# Patient Record
Sex: Male | Born: 1935 | Race: White | Hispanic: No | Marital: Married | Smoking: Former smoker
Health system: Southern US, Community
[De-identification: ages and names within clinical notes are randomized; demographics above are authoritative.]

## PROBLEM LIST (undated history)

## (undated) DIAGNOSIS — I129 Hypertensive chronic kidney disease with stage 1 through stage 4 chronic kidney disease, or unspecified chronic kidney disease: Secondary | ICD-10-CM

## (undated) DIAGNOSIS — I472 Ventricular tachycardia: Secondary | ICD-10-CM

## (undated) DIAGNOSIS — R269 Unspecified abnormalities of gait and mobility: Secondary | ICD-10-CM

## (undated) DIAGNOSIS — E785 Hyperlipidemia, unspecified: Secondary | ICD-10-CM

## (undated) DIAGNOSIS — C679 Malignant neoplasm of bladder, unspecified: Secondary | ICD-10-CM

## (undated) DIAGNOSIS — I499 Cardiac arrhythmia, unspecified: Principal | ICD-10-CM

## (undated) DIAGNOSIS — N179 Acute kidney failure, unspecified: Secondary | ICD-10-CM

## (undated) DIAGNOSIS — G4730 Sleep apnea, unspecified: Secondary | ICD-10-CM

## (undated) DIAGNOSIS — I440 Atrioventricular block, first degree: Secondary | ICD-10-CM

## (undated) DIAGNOSIS — C61 Malignant neoplasm of prostate: Secondary | ICD-10-CM

## (undated) DIAGNOSIS — H9190 Unspecified hearing loss, unspecified ear: Secondary | ICD-10-CM

## (undated) DIAGNOSIS — I2510 Atherosclerotic heart disease of native coronary artery without angina pectoris: Secondary | ICD-10-CM

## (undated) DIAGNOSIS — M1990 Unspecified osteoarthritis, unspecified site: Secondary | ICD-10-CM

## (undated) DIAGNOSIS — N183 Chronic kidney disease, stage 3 (moderate): Secondary | ICD-10-CM

## (undated) DIAGNOSIS — H409 Unspecified glaucoma: Secondary | ICD-10-CM

## (undated) DIAGNOSIS — I493 Ventricular premature depolarization: Secondary | ICD-10-CM

## (undated) DIAGNOSIS — I4891 Unspecified atrial fibrillation: Secondary | ICD-10-CM

## (undated) DIAGNOSIS — I10 Essential (primary) hypertension: Secondary | ICD-10-CM

## (undated) DIAGNOSIS — R413 Other amnesia: Secondary | ICD-10-CM

## (undated) HISTORY — DX: Atrioventricular block, first degree: I44.0

## (undated) HISTORY — DX: Malignant neoplasm of bladder, unspecified: C67.9

## (undated) HISTORY — DX: Unspecified glaucoma: H40.9

## (undated) HISTORY — DX: Atherosclerotic heart disease of native coronary artery without angina pectoris: I25.10

## (undated) HISTORY — DX: Unspecified atrial fibrillation: I48.91

## (undated) HISTORY — DX: Essential (primary) hypertension: I10

## (undated) HISTORY — DX: Ventricular premature depolarization: I49.3

## (undated) HISTORY — PX: EYE SURGERY: SHX253

## (undated) HISTORY — DX: Hyperlipidemia, unspecified: E78.5

## (undated) HISTORY — DX: Acute kidney failure, unspecified: N17.9

## (undated) HISTORY — DX: Ventricular tachycardia: I47.2

## (undated) HISTORY — DX: Cardiac arrhythmia, unspecified: I49.9

## (undated) HISTORY — PX: CORONARY ANGIOPLASTY: SHX604

## (undated) HISTORY — DX: Hypertensive chronic kidney disease with stage 1 through stage 4 chronic kidney disease, or unspecified chronic kidney disease: I12.9

## (undated) HISTORY — DX: Chronic kidney disease, stage 3 (moderate): N18.3

## (undated) HISTORY — DX: Malignant neoplasm of prostate: C61

## (undated) HISTORY — PX: SKIN BIOPSY: SHX1

## (undated) HISTORY — DX: Unspecified abnormalities of gait and mobility: R26.9

## (undated) HISTORY — PX: SKIN CANCER EXCISION: SHX779

## (undated) HISTORY — DX: Other amnesia: R41.3

---
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
## 1999-03-02 ENCOUNTER — Ambulatory Visit (HOSPITAL_COMMUNITY): Admission: RE | Admit: 1999-03-02 | Discharge: 1999-03-02 | Payer: Self-pay | Admitting: Family Medicine

## 2000-02-25 ENCOUNTER — Encounter: Payer: Self-pay | Admitting: Orthopedic Surgery

## 2000-02-25 ENCOUNTER — Encounter: Admission: RE | Admit: 2000-02-25 | Discharge: 2000-02-25 | Payer: Self-pay | Admitting: Orthopedic Surgery

## 2000-02-26 ENCOUNTER — Ambulatory Visit (HOSPITAL_BASED_OUTPATIENT_CLINIC_OR_DEPARTMENT_OTHER): Admission: RE | Admit: 2000-02-26 | Discharge: 2000-02-26 | Payer: Self-pay | Admitting: Orthopedic Surgery

## 2001-09-16 HISTORY — PX: PROSTATE SURGERY: SHX751

## 2001-09-23 ENCOUNTER — Ambulatory Visit (HOSPITAL_COMMUNITY): Admission: RE | Admit: 2001-09-23 | Discharge: 2001-09-23 | Payer: Self-pay | Admitting: *Deleted

## 2001-09-23 ENCOUNTER — Encounter (INDEPENDENT_AMBULATORY_CARE_PROVIDER_SITE_OTHER): Payer: Self-pay | Admitting: *Deleted

## 2001-11-16 ENCOUNTER — Ambulatory Visit: Admission: RE | Admit: 2001-11-16 | Discharge: 2002-02-14 | Payer: Self-pay

## 2001-12-28 ENCOUNTER — Encounter: Admission: RE | Admit: 2001-12-28 | Discharge: 2001-12-28 | Payer: Self-pay | Admitting: Urology

## 2001-12-28 ENCOUNTER — Encounter: Payer: Self-pay | Admitting: Urology

## 2002-02-02 ENCOUNTER — Ambulatory Visit (HOSPITAL_BASED_OUTPATIENT_CLINIC_OR_DEPARTMENT_OTHER): Admission: RE | Admit: 2002-02-02 | Discharge: 2002-02-02 | Payer: Self-pay | Admitting: Urology

## 2002-02-25 ENCOUNTER — Ambulatory Visit: Admission: RE | Admit: 2002-02-25 | Discharge: 2002-05-26 | Payer: Self-pay | Admitting: Radiation Oncology

## 2002-10-06 ENCOUNTER — Ambulatory Visit (HOSPITAL_COMMUNITY): Admission: RE | Admit: 2002-10-06 | Discharge: 2002-10-06 | Payer: Self-pay | Admitting: Family Medicine

## 2002-10-06 ENCOUNTER — Encounter: Payer: Self-pay | Admitting: Cardiology

## 2004-11-16 ENCOUNTER — Ambulatory Visit (HOSPITAL_COMMUNITY): Admission: RE | Admit: 2004-11-16 | Discharge: 2004-11-16 | Payer: Self-pay | Admitting: *Deleted

## 2004-11-16 ENCOUNTER — Encounter (INDEPENDENT_AMBULATORY_CARE_PROVIDER_SITE_OTHER): Payer: Self-pay | Admitting: *Deleted

## 2007-02-24 ENCOUNTER — Encounter (HOSPITAL_BASED_OUTPATIENT_CLINIC_OR_DEPARTMENT_OTHER): Payer: Self-pay | Admitting: General Surgery

## 2007-02-24 ENCOUNTER — Ambulatory Visit (HOSPITAL_BASED_OUTPATIENT_CLINIC_OR_DEPARTMENT_OTHER): Admission: RE | Admit: 2007-02-24 | Discharge: 2007-02-24 | Payer: Self-pay | Admitting: General Surgery

## 2011-01-29 NOTE — Op Note (Signed)
NAME:  [name], FELDHAUS               ACCOUNT NO.:  [id number]   MEDICAL RECORD NO.:  [id number]          PATIENT TYPE:  AMB   LOCATION:  DSC                          FACILITY:  [facility]   PHYSICIAN:  Leonie Man, M.D.   DATE OF BIRTH:  [birth year]   DATE OF PROCEDURE:  02/24/2007  DATE OF DISCHARGE:                               OPERATIVE REPORT   PREOPERATIVE DIAGNOSIS:  Lesion, left upper arm at elbow, rule out  squamous carcinoma.   POSTOPERATIVE DIAGNOSIS:  Lesion, left upper arm at elbow, rule out  squamous carcinoma.  Pathology pending.   OPERATION/PROCEDURE:  Excision of the lesion, left upper arm.   SURGEON:  Leonie Man, M.D.   ASSISTANT:  OR nurse.   ANESTHESIA:  Local. I used 1% lidocaine with epinephrine and wydase .   INDICATIONS:  The patient a 77 year old gentleman who presents with a  scaling lesion of the upper arm on the left.  He has had multiple small  skin cancers removed in the past.  He is referred for evaluation and  excision of this lesion.   DESCRIPTION OF PROCEDURE:  The patient is positioned supinely and the  left arm extended.  The region around the lesion is prepped and draped  to be included in a sterile operative field.  I infiltrated around the  lesion with lidocaine with epinephrine and made an elliptical incision  around the lesion in the skin line, deepening this through skin down  into the subcutaneous tissues.  The lesion was removed in its entirety.  The subcutaneous tissues were reapproximated with 3-0 Vicryl sutures.  The skin was closed with 4-0 Monocryl suture and reinforced with Steri-  Strips and sterile compressive dressing applied and the patient removed  from the operating room in excellent condition.      Leonie Man, M.D.  Electronically Signed     PB/MEDQ  D:  02/24/2007  T:  02/25/2007  Job:  [id number]

## 2011-02-01 NOTE — Op Note (Signed)
[facility]  Patient:    [name], [name] Visit Number: [id number] MRN: [id number]          Service Type: NES Location: NESC Attending Physician:  Katherine Roan Dictated by:   Rozanna Boer., M.D. Proc. Date: 02/02/02 Admit Date:  02/02/2002                             Operative Report  PREOPERATIVE DIAGNOSIS:  T2B Gleason 3+4 adenocarcinoma of the prostate.  POSTOPERATIVE DIAGNOSIS:  T2B Gleason 3+4 adenocarcinoma of the prostate.  OPERATION PERFORMED:  Brachytherapy of the prostate and cysto.  ANESTHESIA:  General.  SURGEON:  Rozanna Boer., M.D. and Wynn Banker, M.D.  BRIEF HISTORY:  This 77 year old patient had a clinical T2B Gleason 3+4 adenocarcinoma of the prostate, PSA of 3.1, 41 gm prostate. He underwent 4500 rads of external beam therapy and enters now for brachytherapy for completion of his therapy. He has a nodule on the right side with 30% positive biopsies on the right and 10% on the left. The patient understands the risks and complications of radiotherapy and enters now for this procedure.  DESCRIPTION OF PROCEDURE:  The patient is placed in the dorsal lithotomy position after satisfactory induction of general anesthesia was prepped and draped with Betadine in the usual sterile fashion. The rectal ultrasound 7.5 MHz probe was inserted in the rectum and the patient was positioned and the probe to correspond to the preoperative scanning planning films. The Foley catheter that had been previously positioned in the urethra and the rectum were carefully noted. A total of 20 needles with 87 seeds of palladium gold were then placed using ultrasonic and fluoroscopic guidance with a satisfactory covering of the prostate.  After the seeds were placed, the Foley catheter was removed and a flexible cystoscope was used to inspect the urethra and then make sure that there were no seeds there or in  the bladder which there were not. The bladder was 1+ trabeculated and had no mucosal lesions. The trigonal orifices looked normal.  The scope was removed and a #16 Foley catheter inserted. The patient was taken to the recovery room where he will be sent home as an outpatient with detailed written instructions. Dictated by:   Rozanna Boer., M.D. Attending Physician:  Katherine Roan DD:  02/02/02 TD:  02/03/02 Job: [id number]

## 2011-02-01 NOTE — Op Note (Signed)
[facility]  Patient:    [name], [name]                      MRN: [id number] Proc. Date: 02/26/00 Adm. Date:  [id number] Disc. Date: [id number] Attending:  Susa Day CC:         Katy Fitch. Sypher, Montez Hageman., M.D. (2)                           Operative Report  PREOPERATIVE DIAGNOSIS:  Entrapment neuropathy, median nerve, right carpal tunnel.  POSTOPERATIVE DIAGNOSIS:  Entrapment neuropathy, median nerve, right carpal tunnel.  OPERATION PERFORMED:  Release of right transverse carpal ligament.  OPERATING SURGEON:  Josephine Igo, M.D.  ANESTHESIA:  IV regional at forearm level.  ANESTHESIOLOGIST:  Edwin Cap. Zoila Shutter, M.D.  INDICATIONS:  The patient is a 77 year old man who was noted by his family physician, Dr. Abigail Miyamoto, to have severe carpal tunnel syndrome with thenar atrophy and numbness in the median distribution of the right hand.  Dr. Abigail Miyamoto recommended a hand surgery consult.  Electrodiagnostic studies confirmed profound carpal tunnel syndrome.  The patient is brought to the operating room at this time for release of his right transverse carpal ligament.  He understands it will be at least six months until he recovers motor function of his thenar muscles and he will probably never have complete recovery of median nerve function due to the severity of his entrapment neuropathy.  After informed consent, he is brought to the operating room at this time for release of his right transverse carpal ligament.  DESCRIPTION OF PROCEDURE:  Salim Zelada was brought to the operating room and placed in supine position on the operating table.  Following light sedation, an IV regional block was placed at the proximal forearm level on the right. When anesthesia was satisfactory, the procedure commenced with a short incision in line of the ring finger in the palm.  The subcutaneous tissues were carefully divided revealing the palmar  fascia.  This was split longitudinally to reveal the common sensory branch of the median nerve.  These were followed back to the transverse carpal ligament which was carefully isolated from the median nerve.  The ligament was released on its ulnar border extending into the distal forearm.  This widely opened the carpal canal.  No masses or other predicaments were noted.  The wound was inspected for bleeding points.  Bleeding points along the margins of the transverse carpal ligament were electrocauterized with bipolar current followed by repair of the skin with intradermal 3-0 Prolene suture.  A compressive dressing was applied with a volar plaster splint maintaining the wrist in 5 degrees dorsiflexion. DD:  02/26/00 TD:  02/29/00 Job: [id number]

## 2011-02-15 HISTORY — PX: CARDIAC CATHETERIZATION: SHX172

## 2011-02-25 ENCOUNTER — Inpatient Hospital Stay (HOSPITAL_COMMUNITY)
Admission: EM | Admit: 2011-02-25 | Discharge: 2011-02-27 | Disposition: A | Discharge status: Other Acute Inpatient Hospital | Payer: 59 | Source: Home / Self Care | Attending: Internal Medicine | Admitting: Internal Medicine

## 2011-02-25 ENCOUNTER — Emergency Department (HOSPITAL_COMMUNITY): Payer: 59

## 2011-02-25 DIAGNOSIS — I498 Other specified cardiac arrhythmias: Principal | ICD-10-CM | POA: Diagnosis present

## 2011-02-25 DIAGNOSIS — Z880 Allergy status to penicillin: Secondary | ICD-10-CM

## 2011-02-25 DIAGNOSIS — I4729 Other ventricular tachycardia: Secondary | ICD-10-CM | POA: Diagnosis present

## 2011-02-25 DIAGNOSIS — I472 Ventricular tachycardia, unspecified: Secondary | ICD-10-CM | POA: Diagnosis present

## 2011-02-25 DIAGNOSIS — Z8546 Personal history of malignant neoplasm of prostate: Secondary | ICD-10-CM

## 2011-02-25 DIAGNOSIS — Z7982 Long term (current) use of aspirin: Secondary | ICD-10-CM

## 2011-02-25 DIAGNOSIS — I2510 Atherosclerotic heart disease of native coronary artery without angina pectoris: Secondary | ICD-10-CM | POA: Diagnosis present

## 2011-02-25 DIAGNOSIS — E785 Hyperlipidemia, unspecified: Secondary | ICD-10-CM | POA: Diagnosis present

## 2011-02-25 DIAGNOSIS — H269 Unspecified cataract: Secondary | ICD-10-CM | POA: Diagnosis present

## 2011-02-25 DIAGNOSIS — I10 Essential (primary) hypertension: Secondary | ICD-10-CM | POA: Diagnosis present

## 2011-02-25 LAB — BASIC METABOLIC PANEL
Calcium: 10 mg/dL (ref 8.4–10.5)
GFR calc non Af Amer: >60 mL/min (ref >60)
Glucose, Bld: 106 mg/dL — ABNORMAL HIGH (ref 70–99)
Potassium: 4.6 mEq/L (ref 3.5–5.1)
Sodium: 137 mEq/L (ref 135–145)

## 2011-02-25 LAB — CBC
HCT: 48.4 % (ref 39.0–52.0)
HCT: 48.8 % (ref 39.0–52.0)
Hemoglobin: 16.4 g/dL (ref 13.0–17.0)
Hemoglobin: 16.3 g/dL (ref 13.0–17.0)
MCH: 32.4 pg (ref 26.0–34.0)
MCHC: 33.9 g/dL (ref 30.0–36.0)
MCHC: 33.4 g/dL (ref 30.0–36.0)
MCV: 97 fL (ref 78.0–100.0)
RBC: 4.99 MIL/uL (ref 4.22–5.81)
RBC: 5.03 MIL/uL (ref 4.22–5.81)
WBC: 10.9 10*3/uL — ABNORMAL HIGH (ref 4.0–10.5)

## 2011-02-25 LAB — DIFFERENTIAL
Basophils Absolute: 0.1 10*3/uL (ref 0.0–0.1)
Basophils Relative: 1 % (ref 0–1)
Lymphocytes Relative: 29 % (ref 12–46)
Lymphocytes Relative: 21 % (ref 12–46)
Lymphs Abs: 2.5 10*3/uL (ref 0.7–4.0)
Monocytes Absolute: 1.4 10*3/uL — ABNORMAL HIGH (ref 0.1–1.0)
Monocytes Absolute: 1.2 10*3/uL — ABNORMAL HIGH (ref 0.1–1.0)
Monocytes Relative: 10 % (ref 3–12)
Neutro Abs: 5.8 10*3/uL (ref 1.7–7.7)
Neutro Abs: 7.5 10*3/uL (ref 1.7–7.7)
Neutrophils Relative %: 53 % (ref 43–77)
Neutrophils Relative %: 65 % (ref 43–77)

## 2011-02-25 LAB — COMPREHENSIVE METABOLIC PANEL
ALT: 18 U/L (ref 0–53)
AST: 23 U/L (ref 0–37)
Alkaline Phosphatase: 62 U/L (ref 39–117)
CO2: 28 mEq/L (ref 19–32)
Calcium: 9.9 mg/dL (ref 8.4–10.5)
GFR calc Af Amer: >60 mL/min (ref >60)
GFR calc non Af Amer: >60 mL/min (ref >60)
Glucose, Bld: 125 mg/dL — ABNORMAL HIGH (ref 70–99)
Potassium: 4.3 mEq/L (ref 3.5–5.1)
Sodium: 137 mEq/L (ref 135–145)
Total Protein: 6.6 g/dL (ref 6.0–8.3)

## 2011-02-25 LAB — CARDIAC PANEL(CRET KIN+CKTOT+MB+TROPI)
CK, MB: 7.1 ng/mL (ref 0.3–4.0)
Relative Index: 4 — ABNORMAL HIGH (ref 0.0–2.5)
Total CK: 206 U/L (ref 7–232)
Total CK: 178 U/L (ref 7–232)

## 2011-02-25 LAB — APTT: aPTT: 30 seconds (ref 24–37)

## 2011-02-25 LAB — TROPONIN I: Troponin I: <0.3 ng/mL (ref <0.30)

## 2011-02-25 LAB — PRO B NATRIURETIC PEPTIDE: Pro B Natriuretic peptide (BNP): 1041 pg/mL — ABNORMAL HIGH (ref 0–450)

## 2011-02-25 LAB — PROTIME-INR: Prothrombin Time: 13.3 seconds (ref 11.6–15.2)

## 2011-02-25 LAB — CK TOTAL AND CKMB (NOT AT ARMC): CK, MB: 10.2 ng/mL (ref 0.3–4.0)

## 2011-02-26 DIAGNOSIS — I498 Other specified cardiac arrhythmias: Secondary | ICD-10-CM

## 2011-02-26 DIAGNOSIS — I517 Cardiomegaly: Secondary | ICD-10-CM

## 2011-02-26 LAB — CARDIAC PANEL(CRET KIN+CKTOT+MB+TROPI)
Relative Index: 4.2 — ABNORMAL HIGH (ref 0.0–2.5)
Troponin I: <0.3 ng/mL (ref <0.30)

## 2011-02-26 LAB — LIPID PANEL
HDL: 50 mg/dL (ref >39)
Total CHOL/HDL Ratio: 2.6 RATIO
Triglycerides: 71 mg/dL (ref <150)

## 2011-02-27 ENCOUNTER — Inpatient Hospital Stay (HOSPITAL_COMMUNITY)
Admission: RE | Admit: 2011-02-27 | Discharge: 2011-02-27 | DRG: 287 | Disposition: A | Discharge status: Home / Self Care | Payer: 59 | Source: Ambulatory Visit | Attending: Cardiovascular Disease | Admitting: Cardiovascular Disease

## 2011-02-27 DIAGNOSIS — I2510 Atherosclerotic heart disease of native coronary artery without angina pectoris: Secondary | ICD-10-CM

## 2011-02-27 LAB — CBC
HCT: 44.2 % (ref 39.0–52.0)
Hemoglobin: 14.9 g/dL (ref 13.0–17.0)
MCH: 32.7 pg (ref 26.0–34.0)
MCHC: 33.7 g/dL (ref 30.0–36.0)
MCV: 96.9 fL (ref 78.0–100.0)
RDW: 13.6 % (ref 11.5–15.5)

## 2011-02-27 LAB — POCT I-STAT 3, ART BLOOD GAS (G3+)
Acid-base deficit: 1 mmol/L (ref 0.0–2.0)
Bicarbonate: 24.3 mEq/L — ABNORMAL HIGH (ref 20.0–24.0)
TCO2: 26 mmol/L (ref 0–100)
pO2, Arterial: 72 mmHg — ABNORMAL LOW (ref 80.0–100.0)

## 2011-02-27 LAB — POCT I-STAT 3, VENOUS BLOOD GAS (G3P V)
Bicarbonate: 25.4 mEq/L — ABNORMAL HIGH (ref 20.0–24.0)
pCO2, Ven: 44.7 mmHg — ABNORMAL LOW (ref 45.0–50.0)
pH, Ven: 7.363 — ABNORMAL HIGH (ref 7.250–7.300)
pO2, Ven: 35 mmHg (ref 30.0–45.0)

## 2011-02-27 LAB — COMPREHENSIVE METABOLIC PANEL
Alkaline Phosphatase: 50 U/L (ref 39–117)
BUN: 31 mg/dL — ABNORMAL HIGH (ref 6–23)
Calcium: 9.4 mg/dL (ref 8.4–10.5)
Creatinine, Ser: 1.26 mg/dL (ref 0.4–1.5)
GFR calc Af Amer: >60 mL/min (ref >60)
Glucose, Bld: 98 mg/dL (ref 70–99)
Total Protein: 6.1 g/dL (ref 6.0–8.3)

## 2011-02-28 ENCOUNTER — Encounter: Payer: Self-pay | Admitting: *Deleted

## 2011-03-11 NOTE — H&P (Signed)
[name], [name]               ACCOUNT NO.:  [id number]  MEDICAL RECORD NO.:  [id number]  LOCATION:  1441                         FACILITY:  [facility]  PHYSICIAN:  Kathlen Mody, MD       DATE OF BIRTH:  11/22/1926  DATE OF ADMISSION:  02/25/2011 DATE OF DISCHARGE:                             HISTORY & PHYSICAL   PRIMARY CARE PHYSICIAN:  Aida Puffer, MD  CHIEF COMPLAINT:  Generalized weakness, fatigue over 3 weeks and bradycardia of 32.  HISTORY OF PRESENT ILLNESS:  This is an 77 year old gentleman with history of hypertension, hyperlipidemia, prostate cancer who was about to get a cataract surgery.  He was found to have a pulse rate of 32 and he was sent to his primary care physician, Dr. Clarene Duke.  Dr. Clarene Duke advised him to come to [facility] ED.  While he was in the ED, an EKG was done which showed a pulse rate of 60s with a lot of PVCs and possible bigeminy.  The patient denies any chest pain, shortness of breath, syncope, palpitations, orthopnea or PND.  Denies any fever, chills or cough.  The patient has generalized weakness and fatigue for over 3 weeks.  The patient had a stress test about 5 years ago and he was told that it was within normal limits.  He denied any other past cardiac history.  The patient also denies any headache, blurry vision, any thyroid problems.  The patient denies any tingling or numbness anywhere in his body.  The patient has a history of urinary urgency from his prostate cancer.  The patient denies any syncope, dizziness at this time.  REVIEW OF SYSTEMS:  See HPI, otherwise negative.  PAST MEDICAL/SURGICAL HISTORY:  History of hypertension; hyperlipidemia; prostate cancer, status post seed implant and radiation; appendectomy at the age of 79.  SOCIAL HISTORY:  The patient lives at home.  Denies smoking, occasional EtOH.  Denies any recreational drug abuse.  HOME MEDICATIONS: 1. Aspirin 81 mg. 2. Lisinopril 20 mg daily. 3. Lipitor 10 mg  daily. 4. Hydrochlorothiazide 12.5 mg.  ALLERGIES:  The patient is allergic to PENICILLIN.  PHYSICAL EXAMINATION:  VITAL SIGNS:  Temperature of 98, pulse of 60, respiratory rate 20, blood pressure 140/75, saturating 100% on room air. GENERAL:  He is alert, afebrile, oriented x3, comfortable, in no acute distress. HEENT:  Normocephalic, atraumatic.  No JVD.  Pupils reacting equally to light and accommodation. CARDIOVASCULAR:  Regular rate and regular rhythm.  No murmurs, rubs or gallops. RESPIRATORY:  Chest clear to auscultation bilaterally.  No wheezing or rhonchi. ABDOMEN:  Soft, nontender, nondistended.  Bowel sounds present.  No organomegaly felt. EXTREMITIES:  No pedal edema. NEUROLOGIC:  The patient is alert, oriented x3.  No focal deficits.  PERTINENT LABORATORY AND X-RAY DATA:  EKG:  Normal sinus, PVCs and first- degree AV block, prolonged PR, and nonspecific T-wave changes.  The patient had a CBC done, significant for WBC count of 10.9, rest of the CBC was within normal limits.  Basic metabolic panel significant for a BUN of 32, glucose of 106.  Troponin, normal; total CK is 294 with moderate hemolysis; CK-MB was 10.2 with normal troponin.  Chest x-ray  showed cardiomegaly, no active disease.  ASSESSMENT AND PLAN:  Bradycardia/generalized weakness and fatigue over 2 weeks.  The patient at this time is not on any beta-blockers or calcium channel blockers.  The patient's generalized weakness or fatigue could be secondary to his bradycardia/abnormal EKG with a lot of PVCs versus secondary to his elevated CK, but first we will admit him to observation under telemetry, monitor his heart rate for 24 hours.  Will get cardiac enzymes q.6 h x3.  Will also get a 2-D echocardiogram as his chest x-ray shows cardiomegaly.  Will rule out other causes of bradycardia like TSH.  Cardiology consult was already called.  We will hold the Lipitor for now.  Will get a lipid profile and repeat  CK levels.  Hypertension:  Will resume his home blood pressure medication of lisinopril.  Slightly elevated white blood cell count/leukocytosis:  The patient is afebrile.  His chest x-ray is negative.  Will also get a urinalysis.  No antibiotics needed at this time.  Deep venous thrombosis prophylaxis:  Subcutaneous Lovenox, dosing as per the pharmacy.  The patient is full code.          ______________________________ Kathlen Mody, MD     VA/MEDQ  D:  02/25/2011  T:  02/25/2011  Job:  [id number]  Electronically Signed by Kathlen Mody MD on 03/11/2011 02:27:08 AM

## 2011-03-13 ENCOUNTER — Encounter: Payer: Self-pay | Admitting: Physician Assistant

## 2011-03-13 NOTE — Cardiovascular Report (Signed)
[name], [name]               ACCOUNT NO.:  [id number]  MEDICAL RECORD NO.:  [id number]  LOCATION:  6527                         FACILITY:  [facility]  PHYSICIAN:  Lorine Bears, MD     DATE OF BIRTH:  [birth year]  DATE OF PROCEDURE:  02/27/2011 DATE OF DISCHARGE:  02/27/2011                           CARDIAC CATHETERIZATION   PROCEDURES PERFORMED: 1. Left heart catheterization. 2. Right heart catheterization. 3. Coronary angiography. 4. Left ventricular angiography. 5. Pressure wire interrogation of the right coronary artery.  INDICATIONS AND CLINICAL HISTORY:  This is an 77 year old male with no previous cardiac history.  He presented for an elective cataract surgery.  However, he was found to be bradycardiac which was thought to be due to ventricular bigeminy.  He was noted to have frequent PVCs.  He had an echocardiogram done which showed some wall motion abnormalities in the inferior wall.  The patient's symptoms included mild exertional dyspnea without chest pain.  Due to frequent PVCs and an abnormal echocardiogram, cardiac catheterization was recommended.  A right heart catheterization was also recommended due to findings of mild heart failure by physical exam.  Risks, benefits, and alternatives were discussed with the patient.  STUDY DETAILS:  A standard informed consent was obtained.  The right groin area was prepped in a sterile fashion.  It was anesthetized with 1% lidocaine.  A 7-French sheath was placed in the right femoral vein. A 5-French sheath was placed in the right femoral artery.  Right heart catheterization was performed with a Swan-Ganz balloon-tipped catheter. Serial pressure recordings were performed.  Blood samples were obtained from the right femoral artery as well as right pulmonary artery to calculate cardiac output by the Fick method.  Left ventricular angiography was performed with pigtail catheter.  A simultaneous left ventricular pressure and  wedge pressures were recorded as well.  A pullback across the aortic valve was performed.  Coronary angiography was then performed with a JL-4 and JR-4 catheters.  Moderate to borderline significant disease in the right coronary artery was noted. Thus, I decided to proceed with pressure wire interrogation of the right coronary artery.  A 5-French sheath was upsized to a 6-French sheath. IV bivalirudin was initiated with subsequent therapeutic ACT.  Volcano Pressure wire was used in standard fashion.  IV adenosine at a dose of 140 mcg/kg/minute was given through a peripheral line.  The interrogation revealed a non flow-limiting lesion and thus, the wire was removed.  Final angiography showed no complications.  The sheaths were sutured in place.  The patient tolerated the procedure well with no immediate complications.  STUDY FINDINGS:  Hemodynamic findings:  Right atrial pressure is 9/8 with a mean of 6 mmHg.  Right ventricular pressure is 44/3 with a right ventricular end-diastolic pressure of 9 mmHg.  Pulmonary wedge pressure is 18/19 with a mean of 13 mmHg.  Pulmonary artery pressure is 41/13 with a mean of 22 mmHg.  Left ventricular pressure is 145/17 with a left ventricular end-diastolic pressure of 17 mmHg.  Central aortic pressure is 143/48 with a mean pressure of 75 mmHg.  Diastolic gradient was noted between the left ventricular and pulmonary wedge pressure.  Oxygen saturation in  the pulmonary artery was 64% and in the right femoral artery was 94%.  Calculated cardiac output was 4.44 liters per minute. Cardiac index was 2.22.  Calculated pulmonary vascular resistance is 2 Woods units.  Left ventricular angiography:  This showed low normal LV systolic function with an estimated ejection fraction of 50-55%.  CORONARY ANGIOGRAPHY:  Left main coronary artery:  The vessel is normal in size with mild calcifications.  There is 10% disease distally close to the bifurcation. Left  anterior descending artery:  The vessel is relatively small in size with mild calcifications.  There is 20% diffuse disease proximally as well as in the mid and the distal LAD without any other evidence of obstructive disease. Ramus coronary artery:  The vessel is normal in size and free of significant disease. Left circumflex artery:  The vessel is normal in size and free of significant disease.  It has minor calcifications. Right coronary artery:  The vessel overall is moderately-to-heavily calcified, especially in the proximal and mid segments.  Proximally, there is a calcified nodule, causing a hazy appearance with an estimated resulting stenosis of 50%.  In the mid segment, there is another discrete 40-50% stenosis.  The rest of the right coronary artery has minor irregularities without any obstructive disease.  STUDY CONCLUSIONS: 1. Mildly elevated filling pressures with only mild pulmonary     hypertension.  Normal cardiac output. 2. Low normal LV systolic function with an estimated ejection fraction     of 50-55%. 3. Moderate right coronary artery disease which was found to be non-     flow limiting by pressure wire interrogation.  FFR ratio was 0.86.     There is mild disease otherwise in the LAD.  RECOMMENDATIONS:  Aggressive medical therapy is recommended.  No revascularization is advised.     Lorine Bears, MD     MA/MEDQ  D:  02/27/2011  T:  02/28/2011  Job:  [id number]  cc:   Duke Salvia, MD, [facility]  Electronically Signed by Lorine Bears MD on 03/13/2011 02:39:17 PM

## 2011-03-13 NOTE — Discharge Summary (Signed)
NAMEAVANTE, CARNEIRO NO.:  [id number]  MEDICAL RECORD NO.:  [id number]  LOCATION:                                 FACILITY:  PHYSICIAN:  Lorine Bears, MD     DATE OF BIRTH:  09/14/1927  DATE OF ADMISSION:  02/27/2011 DATE OF DISCHARGE:  02/28/2011                              DISCHARGE SUMMARY   PRIMARY CARDIOLOGIST:  Duke Salvia, MD, [facility]  PRIMARY CARE PROVIDER:  Anna Genre. Little, MD  DISCHARGE DIAGNOSIS:  Asymptomatic ventricular bigeminy.  SECONDARY DIAGNOSES: 1. Nonobstructive coronary disease by catheterization this admission. 2. Nonsustained ventricular tachycardia. 3. Hypertension. 4. Prostate cancer status post brachytherapy in 2003. 5. Hyperlipidemia. 6. Cataracts.  ALLERGIES:  PENICILLIN.  PROCEDURES:  A 2-D echocardiogram performed on February 26, 2011 showing an EF of 55% with normal cavity size, mild LVH.  Akinesis localized to the base of the inferolateral segment which was new from 2004.  PROCEDURE:  Diagnostic cardiac catheterization performed on June 13,h 2012, revealing nonobstructive coronary artery disease.  There was a calcified nodule in the right coronary artery approximately 50% stenosis with fractional flow reserve of 0.86.  Left ventriculography revealed an EF of 50-55%.  Right heart catheterization was performed showing RA of 9, RV 44/3, PCWP 13, PA 41/13 (22).  Cardiac output is 4.44 liters per minute.  PVR 2.0 Woods unit.  HISTORY OF PRESENT ILLNESS:  An 77 year old male without prior cardiac history who was scheduled to have cataract surgery on February 25, 2011, however, while in the preoperative area was noted by pulse oximetry to have a heart rate of 32.  Surgery was cancelled and patient was sent to [facility] for admission.  In the ED, the patient was found to be in sinus rhythm at a rate of 60 with frequent PVCs and at times ventricular bigeminy.  The patient was admitted and Cardiology was  subsequently consulted.  HOSPITAL COURSE:  The patient had negative cardiac markers.  It was felt that the frequent PVCs accounting for the patient's recorded heart rate of 32 by pulse oximetry representing the poor perfusion of the PVCs.  A 2-D echocardiogram was performed and this showed normal LV function, but appeared to show akinesis localized to the base inferolateral segment. This was felt to be new finding compared to 2004.  Because of frequency of PVCs, Electrophysiology was consulted and in light of patient's presumed wall motion abnormality on echo, it was felt that he would require additional ischemic evaluation, namely, cardiac catheterization.  The patient was transferred to [facility] on February 26, 2101, diagnostic catheterization was performed showing nonobstructive CAD and normal LV function as outlined above.  Right heart catheterization did show mildly elevated right heart pressures.  Continued medical therapy is warranted. At this time, the patient is not on beta-blocker therapy to suppress PVC secondary to baseline bradycardia in the absence of PVCs.  It was felt by Electrophysiology that patient may benefit from an ISA beta-blocker or possibly flecainide, but at this point, in the absence of symptoms, we will not initiate beta-blocker therapy.  They have arranged for followup in our office in approximately 2 weeks for further evaluation discussion.  DISCHARGE LABORATORY DATA:  Hemoglobin 14.9, hematocrit 44.2, WBC 12.0, platelets 178.  Sodium 139, potassium 4.0, chloride 103, CO2 of 28, BUN 31, creatinine 1.26, glucose 98, total bilirubin 0.4, alkaline phosphatase 50, AST 19, ALT 15, total protein 6.1, albumin 3.2, calcium 9.4, phosphorus 3.1, magnesium 2.4, CK 140, MB 5.9, troponin-I less than 0.30, total cholesterol 130, triglycerides 71, HDL 50, LDL 66,  TSH 1.541.  DISPOSITION:  The patient will be discharged home today in good condition.  FOLLOWUP PLANS  AND APPOINTMENTS:  The patient will follow up with Tereso Newcomer PA, [facility] on March 14, 2011, at 11 a.m.  He will continue to follow up Dr. Clarene Duke as previous scheduled.  DISCHARGE MEDICATIONS: 1. Aspirin 81 mg daily. 2. Benefiber over-the-counter once daily. 3. Colon Health 1 cap daily. 4. Fish oil 1 g b.i.d. 5. Hydrochlorothiazide 12.5 mg daily. 6. Lipitor 20 mg daily. 7. Multivitamin 1 tablet daily. 8. Ramipril 10 mg daily. 9. Stool softener 1 tab daily p.r.n.  OUTSTANDING LAB STUDIES:  None.  Duration discharge encounter 60 minutes including physician time.     Nicolasa Ducking, ANP   ______________________________ Lorine Bears, MD    CB/MEDQ  D:  02/27/2011  T:  02/28/2011  Job:  [id number]  cc:   Caryn Bee L. Little, M.D.  Electronically Signed by Nicolasa Ducking ANP on 03/08/2011 02:30:21 PM Electronically Signed by Lorine Bears MD on 03/13/2011 02:39:38 PM

## 2011-03-14 ENCOUNTER — Encounter: Payer: Self-pay | Admitting: Physician Assistant

## 2011-03-14 ENCOUNTER — Ambulatory Visit (INDEPENDENT_AMBULATORY_CARE_PROVIDER_SITE_OTHER): Payer: 59 | Admitting: Physician Assistant

## 2011-03-14 VITALS — BP 116/70 | HR 56 | Ht 69.0 in | Wt 182.8 lb

## 2011-03-14 DIAGNOSIS — I498 Other specified cardiac arrhythmias: Secondary | ICD-10-CM | POA: Insufficient documentation

## 2011-03-14 DIAGNOSIS — H269 Unspecified cataract: Secondary | ICD-10-CM

## 2011-03-14 DIAGNOSIS — E785 Hyperlipidemia, unspecified: Secondary | ICD-10-CM | POA: Insufficient documentation

## 2011-03-14 DIAGNOSIS — I472 Ventricular tachycardia: Secondary | ICD-10-CM

## 2011-03-14 DIAGNOSIS — I10 Essential (primary) hypertension: Secondary | ICD-10-CM

## 2011-03-14 DIAGNOSIS — I2510 Atherosclerotic heart disease of native coronary artery without angina pectoris: Secondary | ICD-10-CM

## 2011-03-14 MED ORDER — FLECAINIDE ACETATE 100 MG PO TABS: 100.0000 mg | ORAL_TABLET | Freq: Two times a day (BID) | ORAL | Status: DC

## 2011-03-14 NOTE — Assessment & Plan Note (Signed)
Continue aspirin and statin. 

## 2011-03-14 NOTE — Assessment & Plan Note (Signed)
Managed by PCP

## 2011-03-14 NOTE — Assessment & Plan Note (Addendum)
He is asymptomatic.  He has related functional bradycardia.  I have discussed his case today with Dr. Graciela Husbands.  There is some concern that he could develop a cardiomyopathy with continued PVCs.  Therefore, Dr. Graciela Husbands recommended we start him on flecainide.  He does have nonobstructive coronary disease.  In the short term, it should be acceptable for him to take flecainide.  He will be placed on 100 mg b.i.d.  He will be brought back for an exercise treadmill test in 1-2 weeks.   I discussed his case further with Dr. Graciela Husbands.  He probably should not remain on Flecainide long term given his nonobstructive CAD.  Therefore, if his ectopy is improved at the time of his ETT with the flecainide, and his ETT does not demonstrate any exercise induced ventricular arrhythmias, we will plan on switching him to Propafenone 225 mg BID.    He will have follow up arranged in 3 months with Dr. Graciela Husbands.

## 2011-03-14 NOTE — Assessment & Plan Note (Signed)
He currently has no cardiovascular contraindication to proceeding with cataract surgery.  I filled out the paperwork for him today and fax it to his ophthalmologist.

## 2011-03-14 NOTE — Patient Instructions (Signed)
Your physician recommends that you schedule a follow-up appointment in: 3 months with Dr. Graciela Husbands as per Tereso Newcomer, PA-C.  Your physician has requested that you have an exercise tolerance test 427.1 THIS NEEDS TO BE DONE WITHIN THE NEXT 1-2 WEEKS, OK  TO SET UP WITH SCOTT WEAVER, PA-C. For further information please visit [email or web address] Please also follow instruction sheet, as given.   Your physician has recommended you make the following change in your medication: START FLECAINIDE 100 MG 1 TABLET EVERY 12 HOURS .

## 2011-03-14 NOTE — Assessment & Plan Note (Signed)
Controlled.  Continue current therapy.  

## 2011-03-14 NOTE — Progress Notes (Signed)
History of Present Illness: Primary Electrophysiologist:  Dr. [name] is a 77 y.o. male who returns for post hospital follow up.  He was admitted to [facility] after having a low HR noted prior to planned cataract surgery.  He was sent to the ED and noted to have PVC's and ventricular bigeminy.  He had an echo that demonstrated normal LVF, EF 55% but basal inferolateral HK, which was new.  He was noted to have NSVT on tele.  He saw Dr. Graciela Husbands who recommended cardiac cath.  This demonstrated nonobstructive CAD.  He did have a proximal calcified nodule in the RCA which was studied by FFR.  This was felt to be insignificant.  It was recommended that trying to prevent PVCs may be warranted to prevent developing a cardiomyopathy.  In follow up today, he denies chest pain, dyspnea, syncope.  No orthopnea, PND or edema.  His cath site is ok.  He still needs cardiac clearance for his cataract surgery.  He denies palpitations.  Past Medical History  Diagnosis Date  . Hyperlipidemia   . Cataract   . Prostate cancer     status post brachytherapy---in 2003  . Hypertension   . Ventricular tachycardia, nonsustained     echo 6/12: EF 55%, basal inferolat AK, mild LVH  . Coronary disease     cath 02/28/11: dLM 10%, pLAD 20%, pRCA calcified nodule 50% (FFR 0.86 - not significant), mRCA 40-50%, mildly elevated filling pressures, EF 50-55%  . Ventricular bigeminy     eval by EF in past    Current Outpatient Prescriptions  Medication Sig Dispense Refill  . aspirin 81 MG tablet Take 81 mg by mouth daily.        Marland Kitchen atorvastatin (LIPITOR) 20 MG tablet Take 20 mg by mouth daily.        Jennette Banker Sodium 30-100 MG CAPS Take by mouth.        . fish oil-omega-3 fatty acids 1000 MG capsule Take 2 g by mouth daily.        Marland Kitchen guar gum packet Take by mouth 3 (three) times daily with meals.        . hydrochlorothiazide (,MICROZIDE/HYDRODIURIL,) 12.5 MG capsule Take 12.5 mg by mouth daily.          . Multiple Vitamin (MULTIVITAMIN) tablet Take 1 tablet by mouth daily.        . Probiotic Product (PHILLIPS COLON HEALTH PO) Take by mouth.        . ramipril (ALTACE) 10 MG capsule Take 20 mg by mouth daily.        Marland Kitchen DISCONTD: ramipril (ALTACE) 10 MG capsule Take 10 mg by mouth daily.        . flecainide (TAMBOCOR) 100 MG tablet Take 1 tablet (100 mg total) by mouth 2 (two) times daily.  60 tablet  6    Allergies: Allergies  Allergen Reactions  . Penicillins     Vital Signs: BP 116/70  Pulse 56  Ht 5\' 9"  (1.753 m)  Wt 182 lb 12.8 oz (82.918 kg)  BMI 26.99 kg/m2  PHYSICAL EXAM: Well nourished, well developed, in no acute distress HEENT: normal Neck: no JVD Cardiac:  normal S1, S2; RRR with frequent premature beats; no murmur Lungs:  clear to auscultation bilaterally, no wheezing, rhonchi or rales Abd: soft, nontender, no hepatomegaly Ext: no edema;  RFA site without hematoma or bruit Skin: warm and dry Neuro:  CNs 2-12 intact, no focal abnormalities noted  EKG:  Sinus brady, HR 56, LAD, PRWP, ventricular bigeminy  ASSESSMENT AND PLAN:

## 2011-03-19 NOTE — Consult Note (Signed)
NAMEIZAAH, WESTMAN NO.:  [id number]  MEDICAL RECORD NO.:  [id number]  LOCATION:  1441                         FACILITY:  [facility]  PHYSICIAN:  Duke Salvia, MD, FACCDATE OF BIRTH:  April 25, 1927  DATE OF CONSULTATION: DATE OF DISCHARGE:                                CONSULTATION   Thank you very much for asking [place] to see Mr. Lengacher in consultation because of ventricular ectopy.  He is an 77 year old gentleman with a past medical history notable for hypertension, dyslipidemia, prostate cancer for which he underwent seed therapy years ago who was to undergo cataract surgery yesterday.  He was noted on monitoring of heart rate of about 32 which turned out to be related to ventricular bigeminy.  He was subsequently admitted.  He ruled out for myocardial infarction although he did have a positive MB with a normal troponin.  He underwent echocardiogram demonstrating normal left ventricular function apart from a concern about an inferior wall motion abnormality that was distinct from 2004.  On his review, it is not clear that there has been a change in his functional status, although there may be some extra fatigue over the last couple of months.  He is otherwise quite active, mowing the yard, playing golf etc. all in a pattern similar to what has been through previously.  He underwent a stress test a couple of years ago, these data are not available and it is not clear from the patient whether he has ever had ventricular ectopy noted previously.  He denies exercise intolerance as noted apart from what has been quite typical.  There has been no syncope and no lightheadedness.  MEDICATIONS:  His medications as an outpatient include Lipitor, hydrochlorothiazide, lisinopril and aspirin.  PAST MEDICAL HISTORY:  His past medical history is notable as above.  Past surgical history is the same except he has also had appendectomy.  SOCIAL HISTORY:  He lives at home  with his wife.  He is remarried.  He lost a daughter at age of 41 of lung cancer.  He does not smoke.  Uses alcohol occasionally.  Denies use of recreational drugs.  ALLERGIC:  PENICILLIN.  REVIEW OF SYSTEMS:  Apart from the above is broadly negative across all organ systems except for the ongoing sadness related to death of his daughter some number of years ago.  PHYSICAL EXAMINATION:  GENERAL:  On examination, he is an elderly Caucasian male who appeared his stated age. VITAL SIGNS:  His blood pressure is 130/60, his pulse is 60, his palpable pulse is 30. HEENT EXAM:  Normal. NECK:  Neck veins were 10 to 11 cm.  Carotids are brisk and full bilaterally without bruits. BACK:  Without kyphosis, scoliosis. LUNGS:  Clear. HEART:  Sounds were irregular.  No murmurs or gallops were appreciated. It was quite slow. ABDOMEN:  Soft with active bowel sounds without midline pulsation or hepatomegaly.  Femoral pulses were 2+.  Distal pulses were intact. EXTREMITIES:  There is no clubbing, cyanosis or edema. NEUROLOGICAL:  Exam was grossly normal. SKIN:  Warm and dry.  LABORATORY DATA:  Laboratories were notable for normal catheterization with a creatinine 1.15, a clearance of greater  than 60 not withstanding his age.  His CK-MB was elevated at 10.2 and falling, MB was 294.  BNP was mildly elevated 1041 but I do not know what to make of that at this age.  Lipids were terrific.  Electrocardiogram demonstrates sinus rhythm at about 50.  There are interpolated PVCs with a right bundle superior axis morphology.  His baseline left bundle, there is poor R-wave progression.  IMPRESSION: 1. 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 2. Nonsustained ventricular tachycardia has also been identified up to     7 beats. 3. Sinus bradycardia. 4. Elevated jugular venous pressure. 5. Abnormal  inferior wall motion abnormality on echo, new from 2004. 6. Hypertension. 7. Cataracts, for surgery which was deferred.  DISCUSSION:  Mr. Kroeker has PVCs which are not clearly symptomatic.  This is from not withstanding the functional bradycardia.  There may be some slight symptoms assigned to the somewhat some mild increasing fatigue. There has been no lightheadedness or syncope or presyncope.  Treating the PVCs I think it is challenge, this is true particularly because of both the functional bradycardia as well as his underlying sinus bradycardia which will be aggravated by the beta-blockers.  Perhaps an ISA beta-blocker could be more helpful, possibly flecainide might be of value.  However, the issue of antiarrhythmic therapy prompts 2 questions, the first is does he have a reason to treat and his absence of symptoms would suggest at this time no.  In the event that he were to develop cardiomyopathic findings which are not unlikely given the density of his ventricular ectopy, in the event that they were to persist, an antiarrhythmic drug suppression or catheter ablation would be appropriate to consider.  The other issue is the cause of his PVCs that is important to elucidate. His neck veins, his nonsustained ventricular tachycardia, his new wall motion abnormality, and elevated MB all are concerning for underlying structural cardiac issues that may be somewhat acute that have given rise to this ventricular ectopy.  I have reviewed that with the patient.  Hence, I would suggest that we undertake a right and left heart catheterization to try to clarify this.  Once we have that information, determination of therapy would be dictated by symptoms.  Thank you for the consultation.     Duke Salvia, MD, [facility]     SCK/MEDQ  D:  02/26/2011  T:  02/26/2011  Job:  [id number]  cc:   Caryn Bee L. Little, M.D. Fax: [phone]  Electronically Signed by Sherryl Manges MD [employer] on 03/19/2011  04:31:58 PM

## 2011-04-01 ENCOUNTER — Ambulatory Visit (INDEPENDENT_AMBULATORY_CARE_PROVIDER_SITE_OTHER): Payer: 59 | Admitting: Physician Assistant

## 2011-04-01 DIAGNOSIS — I498 Other specified cardiac arrhythmias: Secondary | ICD-10-CM

## 2011-04-01 DIAGNOSIS — R002 Palpitations: Secondary | ICD-10-CM

## 2011-04-01 MED ORDER — PROPAFENONE HCL 225 MG PO TABS: 225.0000 mg | ORAL_TABLET | Freq: Three times a day (TID) | ORAL | Status: DC

## 2011-04-01 NOTE — Patient Instructions (Addendum)
Stop taking Flecainide. Start taking Propafenone 225 mg three times daily (about every 8 hours). Follow up with Dr. Graciela Husbands as scheduled.

## 2011-04-01 NOTE — Progress Notes (Deleted)
Exercise Treadmill Test  Pre-Exercise Testing Evaluation Rhythm: Sinus Brady with 1 degree AVB  Rate: 55   PR:  .26 QRS:  .11  QT:  .45 QTc: .43           Test  Exercise Tolerance Test Ordering MD: {CHL LB CARDIOLOGY MD FOR EAV:40981191}  Interpreting MD:  {CHL LB CARDIOLOGY MD FOR YNW:29562130}  Unique Test No: ***  Treadmill:  {CHL TREADMILL # FOR QMV:78469629}  Indication for ETT: {CHL INDICATION FOR BMW:41324401}  Contraindication to ETT: {CHL CONTRAINDICATION TO UUV:25366440}   Stress Modality: {CHL STRESS MODALITY FOR HKV:42595638}  Cardiac Imaging Performed: {CHL CARDIAC IMAGING PERFORMED FOR VFI:43329518}   Protocol: {CHL PROTOCOL FOR ETT:21021061}  Max BP:  ***/***  Max MPHR (bpm):  *** 85% MPR (bpm):  ***  MPHR obtained (bpm):  *** % MPHR obtained:  ***  Reached 85% MPHR (min:sec):  *** Total Exercise Time (min-sec):  ***  Workload in METS:  *** Borg Scale: ***  Reason ETT Terminated:  {CHL REASON TERMINATED FOR ETT:21021064}    ST Segment Analysis At Rest: {CHL ST SEGMENT AT REST FOR ACZ:66063016} With Exercise: {CHL ST SEGMENT WITH EXERCISE FOR WFU:93235573}  Other Information Arrhythmia:  {CHL ARRHYTHMIA FOR UKG:25427062} Angina during ETT:  {CHL ANGINA DURING BJS:28315176} Quality of ETT:  {CHL QUALITY OF HYW:73710626}  ETT Interpretation:  {CHL INTERPRETATION FOR RSW:54627035}  Comments: ***  Recommendations: ***

## 2011-04-01 NOTE — Progress Notes (Signed)
Exercise Treadmill Test  Pre-Exercise Testing Evaluation Rhythm: Sinus Brady with 1 degree AVB  Rate: 55   PR:  .26 QRS:  .11  QT:  .45 QTc: .43           Test  Exercise Tolerance Test Ordering MD: Sherryl Manges, MD  Interpreting MD:  Tereso Newcomer, PA-C  Unique Test No:   Treadmill:  1  Indication for ETT: Flecainide  Contraindication to ETT: No   Stress Modality: exercise - treadmill  Cardiac Imaging Performed: non   Protocol: modified Bruce - submaximal  Max BP:  154/86  100% MPHR:  139 85% MPR (bpm):  118  MPHR obtained (bpm):  104 % MPHR obtained:  75  Reached 85% MPHR (min:sec):  n/a Total Exercise Time (min-sec):  5:00  Workload in METS:  7.2 Borg Scale: 15  Reason ETT Terminated:  patient's desire to stop    ST Segment Analysis At Rest: normal ST segments - no evidence of significant ST depression With Exercise: non-specific ST changes  Other Information Arrhythmia:  No Angina during ETT:  absent (0) Quality of ETT:  non-diagnostic  ETT Interpretation:  normal - no evidence of ischemia by ST analysis  Comments: Poor exercise tolerance. Normal BP response to exercise. No ST-T changes at submaximal exercise. No exercise induced ventricular arrhythmias.  Recommendations: As noted previously, I have discussed his case with Dr. Graciela Husbands.  Flecainide is not the best long term agent. He has not ectopy currently on flecainide and had no exercise induced arrhythmias. Therefore, as we discussed previously, I will stop the flecainide and start Propafenone 225 mg bid. He will follow up with Dr. Graciela Husbands as scheduled in September.

## 2011-04-03 ENCOUNTER — Telehealth: Payer: Self-pay | Admitting: *Deleted

## 2011-04-03 NOTE — Telephone Encounter (Signed)
Pt aware of start taking propafenone bid instead of tid ok per Dr. Graciela Husbands. Danielle Rankin

## 2011-04-04 NOTE — Consult Note (Signed)
[name], [name] NO.:  [id number]  MEDICAL RECORD NO.:  [id number]  LOCATION:  1441                         FACILITY:  [facility]  PHYSICIAN:  Christopher Ancona, MD      DATE OF BIRTH:  10-26-1926  DATE OF CONSULTATION:  02/25/2011 DATE OF DISCHARGE:                                CONSULTATION   PRIMARY. CARE PHYSICIAN:  [name], M.D.  HISTORY OF PRESENT ILLNESS:  This is an 77 year old with history of hypertension, hyperlipidemia who is going in for cataract surgery today and was noted on monitoring to have a heart rate of 32, which likely was actually a heart rate of 64 with ventricular bigeminy.  The surgery was not done.  The patient was sent to the emergency department.  In the emergency department, his actual heart rate on telemetry was in the 60s to 70s, normal sinus rhythm with ventricular bigeminy, suspect that initially when he was in the surgical area, the blood pressure machine was not counting the PVCs and the total heart rate; therefore, he had a heart rate of 32.  The patient's systolic blood pressure has been in 140s to 160s.  He was briefly in normal sinus rhythm without PVCs on telemetry and the rate at this time was in the 70s.  The patient denies any lightheadedness or syncope.  He has no cardiac history.  He does report increased fatigue for about 2 weeks but he denies any exertional dyspnea.  The patient golfs and walks without any problem.  He has never had any chest pain, pressure, or tightness.  MEDICATIONS: 1. Lisinopril 20 mg daily. 2. Lipitor 10 mg daily. 3. Hydrochlorothiazide 12.5 mg daily. 4. Aspirin 81 mg daily.  PAST MEDICAL HISTORY: 1. Prostate cancer, status post brachytherapy in 2003. 2. Hypertension. 3. Hyperlipidemia. 4. Cataracts.  SOCIAL HISTORY:  The patient lives in [place] with his wife.  He is retired from an office job.  He is a nonsmoker.  He smoked an occasional cigar on the past.  Rare  alcohol.  FAMILY HISTORY:  Significant for no coronary disease.  His son does have a bicuspid aortic valve with aortic stenosis.  He says that aortic valve disease actually comes from his wife's side of family.  REVIEW OF SYSTEMS:  All systems were reviewed and were negative except as noted in history of present illness.  PHYSICAL EXAMINATION:  VITAL SIGNS:  Temperature is 97.2, pulse in the 60s to 70s, blood pressure is 142 to 165 over 68 to 75, oxygen saturation 96% on room air.  Telemetry shows normal sinus rhythm with ventricular bigeminy.  This is in the 70s.  There is brief normal sinus rhythm in 70s without PVCs, also noted. GENERAL:  In general, no apparent distress. HEENT:  Normal exam. ABDOMEN:  Soft, nontender.  No hepatosplenomegaly.  Normal bowel sounds. NECK:  JVP seems mildly elevated at 8 to 9 cm of water. CARDIOVASCULAR:  Heart irregularly irregular with ventricular bigeminy. No S3, S4; 2+ posterior tibial pulses bilaterally.  No edema. EXTREMITIES:  No clubbing or cyanosis. LUNGS:  Clear to auscultation bilaterally with normal respiratory effort. SKIN:  Normal exam. NEUROLOGIC:  Alert and oriented  x3.  Normal affect.  RADIOLOGY:  Chest x-ray shows moderate cardiomegaly with no significant disease in lung spaces.  EKG shows normal sinus rhythm with ventricular bigeminy at a rate of 85.  There is a left anterior fascicular block. There is questionable old anterior septal wall anteroseptal MI with Qs in V1 and V2.  White count 10.9, hematocrit 48.4, platelets 201.  Potassium 4.6, creatinine 1.14, glucose 106.  CK 294, CK-MB 10.2, troponin less than 0.03.  IMPRESSION:  This is an 83-year with history of hypertension, hyperlipidemia who is admitted with normal sinus rhythm with ventricular bigeminy. 1. Rhythm.  I suspect that the patient was never truly bradycardic.     The monitor in surgery likely did not pick up the bigeminal PVCs     resulting in accounted  rate of 32 and his heart rate was likely 64.     The patient's heart rate was in the 60s to 70s with bigeminy in the     emergency room here on the telemetry floor.  He did have a brief     run of normal sinus rhythm without PVCs and heart rate was in the     70s at this time.  It is possible that long-standing ventricular     bigeminy can certainly be contributing to his fatigue as this could     lower cardiac output.  This can also lead to a cardiomyopathy if     the PVCs comprise a very large portion of his total beats.  The     patient does appear mildly overloaded on exam with mild increased     neck veins as well as cardiomegaly on chest x-ray.  To begin with,     I will check an echocardiogram tomorrow.  We will monitor him     overnight on telemetry to see how much ectopy he has.  We will     check a TSH and BMP.  I am going to start him on low-dose     metoprolol 12.5 mg p.o. q.6 h. To try to suppress PVCs, this can be     increased as tolerated. 2. The patient has elevated CK and CK-MB, but troponin is normal.  He     has had no chest pain.  I am unsure the significance of these,     these may skeletal muscle related.  We will continue to cycle his     cardiac enzymes to look for any elevation in troponin level.  He     will continue on his baby aspirin.     Christopher Ancona, MD     DM/MEDQ  D:  02/25/2011  T:  02/25/2011  Job:  [id number]  Electronically Signed by Christopher Ancona MD on 04/04/2011 12:03:26 PM

## 2011-05-16 ENCOUNTER — Other Ambulatory Visit: Payer: Self-pay | Admitting: Family Medicine

## 2011-05-17 ENCOUNTER — Ambulatory Visit
Admission: RE | Admit: 2011-05-17 | Discharge: 2011-05-17 | Disposition: A | Discharge status: Home / Self Care | Payer: 59 | Source: Ambulatory Visit | Attending: Family Medicine | Admitting: Family Medicine

## 2011-06-11 ENCOUNTER — Ambulatory Visit (INDEPENDENT_AMBULATORY_CARE_PROVIDER_SITE_OTHER): Payer: 59 | Admitting: Internal Medicine

## 2011-06-11 ENCOUNTER — Encounter: Payer: Self-pay | Admitting: Internal Medicine

## 2011-06-11 DIAGNOSIS — I498 Other specified cardiac arrhythmias: Secondary | ICD-10-CM

## 2011-06-11 DIAGNOSIS — I472 Ventricular tachycardia: Secondary | ICD-10-CM

## 2011-06-11 DIAGNOSIS — I10 Essential (primary) hypertension: Secondary | ICD-10-CM

## 2011-06-11 NOTE — Assessment & Plan Note (Signed)
Elevated today; he plans on playing cards and drinking scotch last night. He will keep an eye on it at home.

## 2011-06-11 NOTE — Assessment & Plan Note (Signed)
resolved 

## 2011-06-11 NOTE — Progress Notes (Signed)
  HPI  [name] is a 77 y.o. 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 He is nonobstructive coronary disease. It was elected to put him on flecainide for suppression but then changed to Rythmol because of the coronary disease.  He is feeling terrific. He is sleeping better exercises better and his overall sense of well-being. He is having no problems with dysgeusia  He has a potassium level checked by his PCP last month. It was normal.  Past Medical History  Diagnosis Date  . Hyperlipidemia   . Cataract   . Prostate cancer     status post brachytherapy---in 2003  . Hypertension   . Ventricular tachycardia, nonsustained     echo 6/12: EF 55%, basal inferolat AK, mild LVH  . CAD (coronary artery disease)     cath 02/28/11: dLM 10%, pLAD 20%, pRCA calcified nodule 50% (FFR 0.86 - not significant), mRCA 40-50%, mildly elevated filling pressures, EF 50-55%  . Ventricular bigeminy     Past Surgical History  Procedure Date  . Appendectomy     Current Outpatient Prescriptions  Medication Sig Dispense Refill  . aspirin 81 MG tablet Take 81 mg by mouth daily.        Marland Kitchen atorvastatin (LIPITOR) 20 MG tablet Take 20 mg by mouth daily.        Jennette Banker Sodium 30-100 MG CAPS Take by mouth.        . fish oil-omega-3 fatty acids 1000 MG capsule Take 2 g by mouth daily.        Marland Kitchen guar gum packet Take by mouth 3 (three) times daily with meals.        . hydrochlorothiazide (,MICROZIDE/HYDRODIURIL,) 12.5 MG capsule Take 12.5 mg by mouth daily.        . Multiple Vitamin (MULTIVITAMIN) tablet Take 1 tablet by mouth daily.        . Probiotic Product (PHILLIPS COLON HEALTH PO) Take by mouth.        . propafenone (RYTHMOL) 225 MG tablet Take 1 tablet (225 mg total) by mouth every 8 (eight) hours.  90 tablet  5  . ramipril (ALTACE) 10 MG capsule Take 20 mg by mouth daily.          Allergies  Allergen Reactions  . Penicillins     Review  of Systems negative except from HPI and PMH  Physical Exam Well developed and well nourished in no acute distress HENT normal E scleral and icterus clear Neck Supple JVP flat; carotids brisk and full Clear to ausculation Regular rate and rhythm, no murmurs gallops or rub Soft with active bowel sounds No clubbing cyanosis and edema Alert and oriented, grossly normal motor and sensory function Skin Warm and Dry  ECG sinus rhythm without ventricular ectopy  Assessment and  Plan

## 2011-06-11 NOTE — Patient Instructions (Signed)
Your physician wants you to follow-up in: 1 year with Dr. Klein. You will receive a reminder letter in the mail two months in advance. If you don't receive a letter, please call our office to schedule the follow-up appointment.  Your physician recommends that you continue on your current medications as directed. Please refer to the Current Medication list given to you today.  

## 2011-06-11 NOTE — Assessment & Plan Note (Signed)
Currently suppressed with his Rythmol. He is tolerating this drug without complication.

## 2011-09-17 HISTORY — PX: BLADDER SURGERY: SHX569

## 2011-12-03 ENCOUNTER — Other Ambulatory Visit: Payer: Self-pay

## 2011-12-03 MED ORDER — PROPAFENONE HCL 225 MG PO TABS: 225.0000 mg | ORAL_TABLET | Freq: Three times a day (TID) | ORAL | Status: DC

## 2012-05-14 ENCOUNTER — Other Ambulatory Visit: Payer: Self-pay | Admitting: Urology

## 2012-05-19 ENCOUNTER — Encounter (HOSPITAL_COMMUNITY): Payer: Self-pay

## 2012-05-19 ENCOUNTER — Encounter (HOSPITAL_COMMUNITY)
Admission: RE | Admit: 2012-05-19 | Discharge: 2012-05-19 | Disposition: A | Discharge status: Home / Self Care | Payer: 59 | Source: Ambulatory Visit | Attending: Urology | Admitting: Urology

## 2012-05-19 ENCOUNTER — Encounter (HOSPITAL_COMMUNITY): Payer: Self-pay | Admitting: Pharmacy Technician

## 2012-05-19 ENCOUNTER — Ambulatory Visit (HOSPITAL_COMMUNITY)
Admission: RE | Admit: 2012-05-19 | Discharge: 2012-05-19 | Disposition: A | Discharge status: Home / Self Care | Payer: 59 | Source: Ambulatory Visit | Attending: Urology | Admitting: Urology

## 2012-05-19 HISTORY — DX: Sleep apnea, unspecified: G47.30

## 2012-05-19 HISTORY — DX: Unspecified hearing loss, unspecified ear: H91.90

## 2012-05-19 HISTORY — DX: Unspecified osteoarthritis, unspecified site: M19.90

## 2012-05-19 LAB — BASIC METABOLIC PANEL
CO2: 31 mEq/L (ref 19–32)
Glucose, Bld: 121 mg/dL — ABNORMAL HIGH (ref 70–99)
Potassium: 5 mEq/L (ref 3.5–5.1)
Sodium: 135 mEq/L (ref 135–145)

## 2012-05-19 LAB — CBC
HCT: 45.2 % (ref 39.0–52.0)
Hemoglobin: 15.7 g/dL (ref 13.0–17.0)
MCH: 34.3 pg — ABNORMAL HIGH (ref 26.0–34.0)
RBC: 4.58 MIL/uL (ref 4.22–5.81)

## 2012-05-19 LAB — SURGICAL PCR SCREEN: Staphylococcus aureus: NEGATIVE

## 2012-05-19 MED ORDER — CIPROFLOXACIN IN D5W 400 MG/200ML IV SOLN
400.0000 mg | INTRAVENOUS | Status: AC
  Administered 2012-05-20: 400 mg via INTRAVENOUS

## 2012-05-19 NOTE — Progress Notes (Signed)
05/19/12 1447  OBSTRUCTIVE SLEEP APNEA  Have you ever been diagnosed with sleep apnea through a sleep study? No  Do you snore loudly (loud enough to be heard through closed doors)?  0  Do you often feel tired, fatigued, or sleepy during the daytime? 1  Has anyone observed you stop breathing during your sleep? 0  Do you have, or are you being treated for high blood pressure? 1  BMI more than 35 kg/m2? 0  Age over 76 years old? 1  Neck circumference greater than 40 cm/18 inches? 0  Gender: 1  Obstructive Sleep Apnea Score 4   Score 4 or greater  Updated health history;Results sent to PCP

## 2012-05-19 NOTE — Pre-Procedure Instructions (Signed)
Patient states has had "recurring fungal infections under my scrotum since my prostate surgery that he puts antifungal ointment on"  States does not think he is broken out now and told Dr Margarita Grizzle about this when he saw him in office

## 2012-05-19 NOTE — Patient Instructions (Signed)
20 [name]  05/19/2012   Your procedure is scheduled on:  05/20/12  Wednesday  Surgery  1030-1200  Report to [facility] at  0800     AM.  Call this number if you have problems the morning of surgery: [phone]     Or PST   [phone]  [facility]   Remember:   Do not eat food  or drink any fluids :After Midnight.  tonight  :  .  Take these medicines the morning of surgery with A SIP OF WATER: PROPAFENONE   Do not wear jewelry, make-up or nail polish.  Do not wear lotions, powders, or perfumes. You may wear deodorant.  Do not shave 48 hours prior to surgery.  Do not bring valuables to the hospital.  Contacts, dentures or bridgework may not be worn into surgery.  Leave suitcase in the car. After surgery it may be brought to your room.  For patients admitted to the hospital, checkout time is 11:00 AM the day of discharge.   Patients discharged the day of surgery will not be allowed to drive home.  Name and phone number of your driver:        [name]  wife                                                              Special Instructions: CHG Shower Use Special Wash: 1/2 bottle night before surgery and 1/2 bottle morning of surgery. REGULAR SOAP FACE AND PRIVATES      MAY SHAVE FACE IN MORNING        Please read over the following fact sheets that you were given: MRSA Information

## 2012-05-20 ENCOUNTER — Encounter (HOSPITAL_COMMUNITY): Payer: Self-pay | Admitting: Anesthesiology

## 2012-05-20 ENCOUNTER — Encounter (HOSPITAL_COMMUNITY)
Admission: RE | Disposition: A | Discharge status: Home / Self Care | Payer: Self-pay | Source: Ambulatory Visit | Attending: Urology

## 2012-05-20 ENCOUNTER — Ambulatory Visit (HOSPITAL_COMMUNITY)
Admission: RE | Admit: 2012-05-20 | Discharge: 2012-05-20 | Disposition: A | Discharge status: Home / Self Care | Payer: 59 | Source: Ambulatory Visit | Attending: Urology | Admitting: Urology

## 2012-05-20 ENCOUNTER — Ambulatory Visit (HOSPITAL_COMMUNITY): Payer: 59 | Admitting: Anesthesiology

## 2012-05-20 ENCOUNTER — Encounter (HOSPITAL_COMMUNITY): Payer: Self-pay | Admitting: *Deleted

## 2012-05-20 DIAGNOSIS — C61 Malignant neoplasm of prostate: Secondary | ICD-10-CM | POA: Insufficient documentation

## 2012-05-20 DIAGNOSIS — N3289 Other specified disorders of bladder: Secondary | ICD-10-CM | POA: Insufficient documentation

## 2012-05-20 DIAGNOSIS — E785 Hyperlipidemia, unspecified: Secondary | ICD-10-CM | POA: Insufficient documentation

## 2012-05-20 DIAGNOSIS — N189 Chronic kidney disease, unspecified: Secondary | ICD-10-CM | POA: Insufficient documentation

## 2012-05-20 DIAGNOSIS — F17200 Nicotine dependence, unspecified, uncomplicated: Secondary | ICD-10-CM | POA: Insufficient documentation

## 2012-05-20 DIAGNOSIS — C671 Malignant neoplasm of dome of bladder: Secondary | ICD-10-CM | POA: Insufficient documentation

## 2012-05-20 DIAGNOSIS — I2510 Atherosclerotic heart disease of native coronary artery without angina pectoris: Secondary | ICD-10-CM | POA: Insufficient documentation

## 2012-05-20 DIAGNOSIS — I129 Hypertensive chronic kidney disease with stage 1 through stage 4 chronic kidney disease, or unspecified chronic kidney disease: Secondary | ICD-10-CM | POA: Insufficient documentation

## 2012-05-20 DIAGNOSIS — G4730 Sleep apnea, unspecified: Secondary | ICD-10-CM | POA: Insufficient documentation

## 2012-05-20 HISTORY — PX: CYSTOSCOPY: SHX5120

## 2012-05-20 HISTORY — PX: TRANSURETHRAL RESECTION OF BLADDER TUMOR: SHX2575

## 2012-05-20 SURGERY — TURBT (TRANSURETHRAL RESECTION OF BLADDER TUMOR)
Anesthesia: General | Wound class: Clean Contaminated

## 2012-05-20 MED ORDER — FENTANYL CITRATE 0.05 MG/ML IJ SOLN
INTRAMUSCULAR | Status: DC | PRN
  Administered 2012-05-20: 50 ug via INTRAVENOUS
  Administered 2012-05-20: 25 ug via INTRAVENOUS

## 2012-05-20 MED ORDER — FENTANYL CITRATE 0.05 MG/ML IJ SOLN
25.0000 ug | INTRAMUSCULAR | Status: DC | PRN
  Administered 2012-05-20: 25 ug via INTRAVENOUS
  Administered 2012-05-20: 50 ug via INTRAVENOUS

## 2012-05-20 MED ORDER — CIPROFLOXACIN HCL 250 MG PO TABS: 250.0000 mg | ORAL_TABLET | Freq: Two times a day (BID) | ORAL | Status: AC

## 2012-05-20 MED ORDER — SODIUM CHLORIDE 0.9 % IR SOLN
Status: DC | PRN
  Administered 2012-05-20: 6000 mL via INTRAVESICAL

## 2012-05-20 MED ORDER — ONDANSETRON HCL 4 MG/2ML IJ SOLN
INTRAMUSCULAR | Status: DC | PRN
  Administered 2012-05-20: 4 mg via INTRAVENOUS

## 2012-05-20 MED ORDER — ACETAMINOPHEN 10 MG/ML IV SOLN
INTRAVENOUS | Status: DC | PRN
  Administered 2012-05-20: 1000 mg via INTRAVENOUS

## 2012-05-20 MED ORDER — SENNOSIDES-DOCUSATE SODIUM 8.6-50 MG PO TABS: 1.0000 | ORAL_TABLET | Freq: Two times a day (BID) | ORAL | Status: DC

## 2012-05-20 MED ORDER — PROMETHAZINE HCL 25 MG/ML IJ SOLN: 6.2500 mg | INTRAMUSCULAR | Status: DC | PRN

## 2012-05-20 MED ORDER — ACETAMINOPHEN 10 MG/ML IV SOLN
INTRAVENOUS | Status: AC
  Filled 2012-05-20: qty 100

## 2012-05-20 MED ORDER — FENTANYL CITRATE 0.05 MG/ML IJ SOLN
INTRAMUSCULAR | Status: AC
  Filled 2012-05-20: qty 2

## 2012-05-20 MED ORDER — LACTATED RINGERS IV SOLN
INTRAVENOUS | Status: DC
  Administered 2012-05-20: 1000 mL via INTRAVENOUS

## 2012-05-20 MED ORDER — PROPOFOL 10 MG/ML IV BOLUS
INTRAVENOUS | Status: DC | PRN
  Administered 2012-05-20: 130 mg via INTRAVENOUS

## 2012-05-20 MED ORDER — TRAMADOL HCL 50 MG PO TABS: 50.0000 mg | ORAL_TABLET | ORAL | Status: AC | PRN

## 2012-05-20 MED ORDER — CIPROFLOXACIN IN D5W 400 MG/200ML IV SOLN
INTRAVENOUS | Status: AC
  Filled 2012-05-20: qty 200

## 2012-05-20 MED ORDER — EPHEDRINE SULFATE 50 MG/ML IJ SOLN
INTRAMUSCULAR | Status: DC | PRN
Start: 2012-05-20 — End: 2012-05-20
  Administered 2012-05-20: 5 mg via INTRAVENOUS

## 2012-05-20 MED ORDER — LIDOCAINE HCL (CARDIAC) 20 MG/ML IV SOLN
INTRAVENOUS | Status: DC | PRN
  Administered 2012-05-20: 60 mg via INTRAVENOUS

## 2012-05-20 SURGICAL SUPPLY — 17 items
BAG URINE DRAINAGE (UROLOGICAL SUPPLIES) ×1 IMPLANT
BAG URO CATCHER STRL LF (DRAPE) ×2 IMPLANT
CATH TIEMANN FOLEY 18FR 5CC (CATHETERS) ×1 IMPLANT
CLOTH BEACON ORANGE TIMEOUT ST (SAFETY) ×2 IMPLANT
DRAPE CAMERA CLOSED 9X96 (DRAPES) ×2 IMPLANT
ELECT LOOP MED HF 24F 12D CBL (CLIP) ×2 IMPLANT
ELECT REM PT RETURN 9FT ADLT (ELECTROSURGICAL)
ELECTRODE REM PT RTRN 9FT ADLT (ELECTROSURGICAL) ×1 IMPLANT
EVACUATOR MICROVAS BLADDER (UROLOGICAL SUPPLIES) IMPLANT
GLOVE BIOGEL M STRL SZ7.5 (GLOVE) ×2 IMPLANT
GOWN STRL NON-REIN LRG LVL3 (GOWN DISPOSABLE) ×4 IMPLANT
KIT ASPIRATION TUBING (SET/KITS/TRAYS/PACK) IMPLANT
LOOPS RESECTOSCOPE DISP (ELECTROSURGICAL) ×1 IMPLANT
MANIFOLD NEPTUNE II (INSTRUMENTS) ×2 IMPLANT
PACK CYSTO (CUSTOM PROCEDURE TRAY) ×2 IMPLANT
SYRINGE IRR TOOMEY STRL 70CC (SYRINGE) IMPLANT
TUBING CONNECTING 10 (TUBING) ×2 IMPLANT

## 2012-05-20 NOTE — Progress Notes (Signed)
Patient and wife instructed to cut catheter on Monday and remove, if any problems or concerns to call Dr. 's office

## 2012-05-20 NOTE — Anesthesia Postprocedure Evaluation (Signed)
  Anesthesia Post-op Note  Patient: [name]  Procedure(s) Performed: Procedure(s) (LRB): TRANSURETHRAL RESECTION OF BLADDER TUMOR (TURBT) (N/A) CYSTOSCOPY (N/A)  Patient Location: PACU  Anesthesia Type: General  Level of Consciousness: awake and alert   Airway and Oxygen Therapy: Patient Spontanous Breathing  Post-op Pain: mild  Post-op Assessment: Post-op Vital signs reviewed, Patient's Cardiovascular Status Stable, Respiratory Function Stable, Patent Airway and No signs of Nausea or vomiting  Post-op Vital Signs: stable  Complications: No apparent anesthesia complications 

## 2012-05-20 NOTE — Anesthesia Preprocedure Evaluation (Addendum)
Anesthesia Evaluation  Patient identified by MRN, date of birth, ID band Patient awake  General Assessment Comment:Diagnosis  Date   .  Hyperlipidemia     .  Cataract     .  Prostate cancer         status post brachytherapy---in 2003   .  Ventricular tachycardia, nonsustained         echo 6/12: EF 55%, basal inferolat AK, mild LVH   .  CAD (coronary artery disease)         cath 02/28/11: dLM 10%, pLAD 20%, pRCA calcified nodule 50% (FFR 0.86 - not significant), mRCA 40-50%, mildly elevated filling pressures, EF 50-55%   .  Chronic kidney disease     .  Hematuria     .  Arthritis     .  Hard of hearing     .  Sleep apnea         STOP BANG SCORE 4   .  Ventricular bigeminy     .  Hypertension         LOV with EKG 06/11/11 Dr Graciela Husbands  [facility]     Reviewed: Allergy & Precautions, H&P , NPO status , Patient's Chart, lab work & pertinent test results  History of Anesthesia Complications (+) AWARENESS UNDER ANESTHESIA  Airway Mallampati: II TM Distance: >3 FB Neck ROM: Full    Dental No notable dental hx.    Pulmonary neg pulmonary ROS, sleep apnea ,  breath sounds clear to auscultation  Pulmonary exam normal       Cardiovascular hypertension, Pt. on medications + CAD negative cardio ROS  + dysrhythmias Rhythm:Regular Rate:Normal     Neuro/Psych negative neurological ROS  negative psych ROS   GI/Hepatic Neg liver ROS, H/O bradycardia and bigeminy. Recent cardiology note (09/12) reviewed.   Endo/Other  negative endocrine ROS  Renal/GU Renal disease  negative genitourinary   Musculoskeletal negative musculoskeletal ROS (+)   Abdominal   Peds negative pediatric ROS (+)  Hematology negative hematology ROS (+)   Anesthesia Other Findings   Reproductive/Obstetrics negative OB ROS                         Anesthesia Physical Anesthesia Plan  ASA: III  Anesthesia Plan: General   Post-op Pain  Management:    Induction: Intravenous  Airway Management Planned: LMA  Additional Equipment:   Intra-op Plan:   Post-operative Plan: Extubation in OR  Informed Consent: I have reviewed the patients History and Physical, chart, labs and discussed the procedure including the risks, benefits and alternatives for the proposed anesthesia with the patient or authorized representative who has indicated his/her understanding and acceptance.   Dental advisory given  Plan Discussed with: CRNA  Anesthesia Plan Comments:         Anesthesia Quick Evaluation

## 2012-05-20 NOTE — H&P (Signed)
[name] is an 78 y.o. male.   Chief Complaint: pre-op for Transurethral Resection of Bladder Tumor HPI:  1 - Bladder Tumor - Pt with anterior-dome bladder mass found on CT and cysto during w/u of gross hematuria by Dr. Margarita Grizzle. No prior bladder cancer. He has had radiotherapy for prostate cancer as per below.  2 - Prostate Cancer - s/p XRT + brachy boost for adenocarcinoma in the past, most recent PSA -0.22.   PMH sig for CAD on ASA, no strong anticoagulants or antiplatlet drugs.   Past Medical History  Diagnosis Date  . Hyperlipidemia   . Cataract   . Prostate cancer     status post brachytherapy---in 2003  . Ventricular tachycardia, nonsustained     echo 6/12: EF 55%, basal inferolat AK, mild LVH  . CAD (coronary artery disease)     cath 02/28/11: dLM 10%, pLAD 20%, pRCA calcified nodule 50% (FFR 0.86 - not significant), mRCA 40-50%, mildly elevated filling pressures, EF 50-55%  . Chronic kidney disease   . Hematuria   . Arthritis   . Hard of hearing   . Sleep apnea     STOP BANG SCORE 4  . Ventricular bigeminy   . Hypertension     LOV with EKG 06/11/11 Dr Myrtie Hawk    Past Surgical History  Procedure Date  . Appendectomy   . Cardiac catheterization 6/12  . Skin cancer excision     x 4  . Eye surgery     left cataract extraction with IOL    No family history on file. Social History:  reports that he quit smoking about 17 years ago. His smoking use included Cigars. His smokeless tobacco use includes Chew. He reports that he drinks alcohol. He reports that he does not use illicit drugs.  Allergies:  Allergies  Allergen Reactions  . Neosporin (Neomycin-Bacitracin Zn-Polymyx) Other (See Comments)    Makes his skin red   . Penicillins Hives and Itching    No prescriptions prior to admission    Results for orders placed during the hospital encounter of 05/19/12 (from the past 48 hour(s))  SURGICAL PCR SCREEN     Status: Normal   Collection Time   05/19/12   2:18 PM      Component Value Range Comment   MRSA, PCR NEGATIVE  NEGATIVE    Staphylococcus aureus NEGATIVE  NEGATIVE   CBC     Status: Abnormal   Collection Time   05/19/12  3:20 PM      Component Value Range Comment   WBC 9.6  4.0 - 10.5 K/uL    RBC 4.58  4.22 - 5.81 MIL/uL    Hemoglobin 15.7  13.0 - 17.0 g/dL    HCT 16.1  09.6 - 04.5 %    MCV 98.7  78.0 - 100.0 fL    MCH 34.3 (*) 26.0 - 34.0 pg    MCHC 34.7  30.0 - 36.0 g/dL    RDW 40.9  81.1 - 91.4 %    Platelets 225  150 - 400 K/uL   BASIC METABOLIC PANEL     Status: Abnormal   Collection Time   05/19/12  3:20 PM      Component Value Range Comment   Sodium 135  135 - 145 mEq/L    Potassium 5.0  3.5 - 5.1 mEq/L    Chloride 98  96 - 112 mEq/L    CO2 31  19 - 32 mEq/L  Glucose, Bld 121 (*) 70 - 99 mg/dL    BUN 16  6 - 23 mg/dL    Creatinine, Ser 1.61  0.50 - 1.35 mg/dL    Calcium 9.5  8.4 - 09.6 mg/dL    GFR calc non Af Amer 61 (*) >90 mL/min    GFR calc Af Amer 70 (*) >90 mL/min    Dg Chest 2 View  05/19/2012  *RADIOLOGY REPORT*  Clinical Data: Preop for transurethral resection of bladder tumor. History hypertension, CAD, smoking.  CHEST - 2 VIEW  Comparison: 02/25/2011  Findings: The heart is mildly enlarged.  The lungs are free of focal consolidations and pleural effusions.  No pulmonary edema. There are degenerative changes in the spine.  IMPRESSION:  1.  Cardiomegaly. 2. No evidence for acute pulmonary abnormality.   Original Report Authenticated By: Patterson Hammersmith, M.D.     Review of Systems  Constitutional: Negative.   HENT: Negative.   Eyes: Negative.   Respiratory: Negative.   Cardiovascular: Negative.   Gastrointestinal: Negative.   Genitourinary: Positive for dysuria, urgency and hematuria.  Musculoskeletal: Negative.   Skin: Negative.   Neurological: Negative.   Endo/Heme/Allergies: Negative.   Psychiatric/Behavioral: Negative.     There were no vitals taken for this visit. Physical Exam    Constitutional: He is oriented to person, place, and time. He appears well-developed and well-nourished.       elderly  HENT:  Head: Normocephalic and atraumatic.  Eyes: Pupils are equal, round, and reactive to light.  Neck: Normal range of motion. Neck supple.  Cardiovascular: Normal rate.   Respiratory: Effort normal.  GI: Soft. Bowel sounds are normal.  Genitourinary: Penis normal.  Musculoskeletal: Normal range of motion.  Neurological: He is alert and oriented to person, place, and time.  Skin: Skin is warm and dry.  Psychiatric: He has a normal mood and affect. His behavior is normal. Judgment and thought content normal.     Assessment/Plan 1 - Bladder Tumor - We discussed the diagnostic and therapeutic role of TURBT in the management of bladder tumors and pt wishes to proceed. He had previously discussed this at length with Dr. Margarita Grizzle as well. I have agreed to perform his procedure today to expedite is care as Dr. Margarita Grizzle is backed up for OR time at the present. Risks including bleeding, infection, damage to bladder / ureter, need for ureteral stenting, bladder perforation, need for foley discussed.   Laveda Demedeiros 05/20/2012, 6:38 AM

## 2012-05-20 NOTE — Transfer of Care (Signed)
Immediate Anesthesia Transfer of Care Note  Patient: [name]  Procedure(s) Performed: Procedure(s) (LRB) with comments: TRANSURETHRAL RESECTION OF BLADDER TUMOR (TURBT) (N/A) - [facility]  CYSTOSCOPY (N/A)  Patient Location: PACU  Anesthesia Type: General  Level of Consciousness: awake, alert , oriented and patient cooperative  Airway & Oxygen Therapy: Patient Spontanous Breathing and Patient connected to face mask oxygen  Post-op Assessment: Report given to PACU RN, Post -op Vital signs reviewed and stable and Patient moving all extremities  Post vital signs: Reviewed and stable  Complications: No apparent anesthesia complications

## 2012-05-20 NOTE — Preoperative (Signed)
Beta Blockers   Reason not to administer Beta Blockers:Not Applicable 

## 2012-05-20 NOTE — Brief Op Note (Signed)
05/20/2012  11:42 AM  PATIENT:  [name]  78 y.o. male  PRE-OPERATIVE DIAGNOSIS:  BLADDER TUMOR  POST-OPERATIVE DIAGNOSIS:  Bladder tumor  PROCEDURE:  Procedure(s) (LRB) with comments: TRANSURETHRAL RESECTION OF BLADDER TUMOR (TURBT) (N/A) - [facility]  CYSTOSCOPY (N/A)  SURGEON:  Surgeon(s) and Role:    * Sebastian Ache, MD - Primary  PHYSICIAN ASSISTANT:   ASSISTANTS: none   ANESTHESIA:   general  EBL:   20cc BLOOD ADMINISTERED:none  DRAINS: 38F Foley to gravity drainage   LOCAL MEDICATIONS USED:  NONE  SPECIMEN:  Source of Specimen:  Bladder Tumor, Bladder Tumor Base  DISPOSITION OF SPECIMEN:  PATHOLOGY  COUNTS:  YES  TOURNIQUET:  * No tourniquets in log *  DICTATION: .Other Dictation: Dictation Number [id number]  PLAN OF CARE: Discharge to home after PACU  PATIENT DISPOSITION:  PACU - hemodynamically stable.   Delay start of Pharmacological VTE agent (>24hrs) due to surgical blood loss or risk of bleeding: not applicable

## 2012-05-21 ENCOUNTER — Encounter (HOSPITAL_COMMUNITY): Payer: Self-pay | Admitting: Urology

## 2012-05-21 NOTE — Op Note (Signed)
[name], [name] NO.:  [id number]  MEDICAL RECORD NO.:  [id number]  LOCATION:  [facility]                         FACILITY:  [facility]  PHYSICIAN:  Christopher Ache, MD     DATE OF BIRTH:  September 05, 1927  DATE OF PROCEDURE: DATE OF DISCHARGE:  05/20/2012                              OPERATIVE REPORT   PREOPERATIVE DIAGNOSIS:  Bladder tumor.  PROCEDURE:  Transurethral resection of bladder tumor, volume medium.  FINDINGS: 1. Anterior dome bladder mass approximately 3 cm in diameter with     papillary and sessile components. 2. No evidence of bladder perforation following resection. 3. Otherwise unremarkable urinary bladder.  ESTIMATED BLOOD LOSS:  20 mL.  SPECIMENS: 1. Bladder tumor. 2. Base of bladder tumor.  DRAINS:  An 18-eighteen-French Foley catheter straight drain.  COMPLICATIONS:  None.  INDICATION:  [name] is a pleasant 78 year old gentleman with history of prostate cancer who then subsequently developed gross hematuria.  On evaluation of this including axial imaging and cystoscopy, he was noted to have an anterior bladder mass worrisome for cancer.  Options were discussed and he wished to proceed with the initial diagnostic and therapeutic maneuver of transurethral resection.  Informed consent was obtained and placed in medical record.  PROCEDURE IN DETAIL:  The patient being, [name], verified; procedure being transurethral resection of bladder tumor was confirmed. Procedure was carried out.  Time-out was performed.  Intravenous antibiotics administered.  General LMA anesthesia was induced.  The patient was placed into a low lithotomy position.  Sterile field was created by prepping and draping the patient's penis, perineum, and proximal thighs using iodine x3.  Next, a cystourethroscopy was performed using a 22-French rigid cystoscope with a 12-degree lens. Inspection of the anterior urethra was unremarkable.  Inspection of the prostatic  urethra revealed some mild bilobar hypertrophy.  No evidence of stricture disease.  Inspection of urinary bladder revealed diffuse mild trabeculations.  Ureteral orifices were in the normal anatomic position effluxing clear urine bilaterally.  In the anterior dome, a bladder mass was noted, appeared to be partially papillary and sessile, it was approximately 3 cm in diameter.  The cystoscope was changed to 31- [place] gyrus type, continuous-flow resectoscope using normal saline irrigation.  Very careful resection was performed of the mass taking it flat against the bladder wall.  These fragments were set aside for pathology lab of bladder tumor.  Two separate swipes were taken of the muscular layer to obtain deep tissue biopsy very carefully as to avoid bladder perforation, which did not occur.  Separate fragment was then set aside and labelled base of bladder tumor.  Coagulation current was applied to the edges of resection and all visible tumor had been resected or ablated.  Repeat final inspection revealed excellent hemostasis.  No evidence of bladder perforation.  Again the patient has a history of prior prostate radiotherapy as well as the fact this is a dome resection, it was felt that urethral catheter was warranted.  As such, a new 18-French Foley catheter was placed in the ureter to straight drain. Irrigated quantitatively and efflux of urine was light pink.  Procedure was then terminated.  The patient tolerated the procedure  well.  There were no immediate perioperative complications.  The patient was taken to postanesthesia care in excellent condition.          ______________________________ Christopher Ache, MD     TM/MEDQ  D:  05/20/2012  T:  05/21/2012  Job:  [id number]

## 2012-06-11 ENCOUNTER — Other Ambulatory Visit: Payer: Self-pay | Admitting: Urology

## 2012-06-24 ENCOUNTER — Encounter (HOSPITAL_COMMUNITY): Payer: Self-pay | Admitting: Pharmacy Technician

## 2012-06-25 ENCOUNTER — Encounter: Payer: Self-pay | Admitting: Internal Medicine

## 2012-06-25 ENCOUNTER — Ambulatory Visit (INDEPENDENT_AMBULATORY_CARE_PROVIDER_SITE_OTHER): Payer: 59 | Admitting: Internal Medicine

## 2012-06-25 VITALS — BP 144/80 | HR 61 | Ht 69.0 in | Wt 188.6 lb

## 2012-06-25 DIAGNOSIS — I2510 Atherosclerotic heart disease of native coronary artery without angina pectoris: Secondary | ICD-10-CM

## 2012-06-25 DIAGNOSIS — E785 Hyperlipidemia, unspecified: Secondary | ICD-10-CM

## 2012-06-25 DIAGNOSIS — I498 Other specified cardiac arrhythmias: Secondary | ICD-10-CM

## 2012-06-25 NOTE — Assessment & Plan Note (Signed)
Lipids were at goal a year ago; continue statin

## 2012-06-25 NOTE — Assessment & Plan Note (Signed)
Largely controlled currently on propafenone. We will continue. I recommended that he take his beta blocker at the time of his surgery.

## 2012-06-25 NOTE — Patient Instructions (Signed)
Follow 1 year.

## 2012-06-25 NOTE — Progress Notes (Signed)
Patient Care Team: Catha Gosselin, MD as PCP - General (Family Medicine)   HPI  [name] is a 78 y.o. 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 He is nonobstructive coronary disease. It was elected to put him on flecainide for suppression but then changed to Rythmol because of the coronary disease.     He has recently been diagnosed with bladder cancer and underwent a preliminary procedure which needs to be more extensive ; this is scheduled for next week  The patient denies chest pain, shortness of breath, nocturnal dyspnea, orthopnea or peripheral edema.  There have been no palpitations, lightheadedness or syncope.    Past Medical History  Diagnosis Date  . Hyperlipidemia   . Cataract   . Prostate cancer     status post brachytherapy---in 2003  . Ventricular tachycardia, nonsustained     echo 6/12: EF 55%, basal inferolat AK, mild LVH  . CAD (coronary artery disease)     cath 02/28/11: dLM 10%, pLAD 20%, pRCA calcified nodule 50% (FFR 0.86 - not significant), mRCA 40-50%, mildly elevated filling pressures, EF 50-55%  . Chronic kidney disease   . Hematuria   . Arthritis   . Hard of hearing   . Sleep apnea     STOP BANG SCORE 4  . Ventricular bigeminy   . Hypertension     LOV with EKG 06/11/11 Dr Myrtie Hawk    Past Surgical History  Procedure Date  . Appendectomy   . Cardiac catheterization 6/12  . Skin cancer excision     x 4  . Eye surgery     left cataract extraction with IOL  . Transurethral resection of bladder tumor 05/20/2012    Procedure: TRANSURETHRAL RESECTION OF BLADDER TUMOR (TURBT);  Surgeon: Sebastian Ache, MD;  Location: [facility];  Service: Urology;  Laterality: N/A;  [facility]   . Cystoscopy 05/20/2012    Procedure: CYSTOSCOPY;  Surgeon: Sebastian Ache, MD;  Location: [facility];  Service: Urology;  Laterality: N/A;    Current Outpatient Prescriptions  Medication Sig Dispense Refill  . acetaminophen (TYLENOL) 500  MG tablet Take 500 mg by mouth every 6 (six) hours as needed.       Marland Kitchen aspirin 81 MG tablet Take 81 mg by mouth daily.       Marland Kitchen atorvastatin (LIPITOR) 20 MG tablet Take 20 mg by mouth every evening.       . docusate sodium (COLACE) 100 MG capsule Take 100 mg by mouth as needed.      . fish oil-omega-3 fatty acids 1000 MG capsule Take 1 g by mouth 2 (two) times daily.       . hydrochlorothiazide (,MICROZIDE/HYDRODIURIL,) 12.5 MG capsule Take 12.5 mg by mouth daily with breakfast.       . magnesium hydroxide (MILK OF MAGNESIA) 400 MG/5ML suspension Take 10 mLs by mouth daily as needed.      . Multiple Vitamin (MULTIVITAMIN WITH MINERALS) TABS Take 1 tablet by mouth daily.      . Probiotic Product (PHILLIPS COLON HEALTH PO) Take 1 capsule by mouth daily.       . propafenone (RYTHMOL) 225 MG tablet Take 225 mg by mouth 2 (two) times daily.      . ramipril (ALTACE) 10 MG capsule Take 20 mg by mouth daily with breakfast.       . senna-docusate (SENOKOT-S) 8.6-50 MG per tablet Take 1 tablet by mouth as needed. While taking pain meds to prevent  constipation      . zinc gluconate 50 MG tablet Take 50 mg by mouth daily.        Allergies  Allergen Reactions  . Neosporin (Neomycin-Bacitracin Zn-Polymyx) Other (See Comments)    Makes his skin red   . Penicillins Hives and Itching    Review of Systems negative except from HPI and PMH  Physical Exam BP 144/80  Pulse 61  Ht 5\' 9"  (1.753 m)  Wt 188 lb 9.6 oz (85.548 kg)  BMI 27.85 kg/m2 Well developed and well nourished in no acute distress HENT normal E scleral and icterus clear Neck Supple JVP flat; carotids brisk and full Clear to ausculation Regular rate and rhythm, no murmurs gallops or rub Soft with active bowel sounds No clubbing cyanosis Trace Edema Alert and oriented, grossly normal motor and sensory function Skin Warm and Dry  Electrocardiogram demonstrates sinus rhythm at 61 Intervals 24/11/42 Axis is -62  Assessment and   Plan

## 2012-06-25 NOTE — Assessment & Plan Note (Signed)
Stable

## 2012-06-29 ENCOUNTER — Encounter (HOSPITAL_COMMUNITY): Payer: Self-pay

## 2012-06-29 ENCOUNTER — Encounter (HOSPITAL_COMMUNITY)
Admission: RE | Admit: 2012-06-29 | Discharge: 2012-06-29 | Disposition: A | Discharge status: Home / Self Care | Payer: 59 | Source: Ambulatory Visit | Attending: Urology | Admitting: Urology

## 2012-06-29 LAB — CBC
HCT: 44 % (ref 39.0–52.0)
Hemoglobin: 15.3 g/dL (ref 13.0–17.0)
MCV: 99.3 fL (ref 78.0–100.0)
WBC: 9.5 10*3/uL (ref 4.0–10.5)

## 2012-06-29 LAB — BASIC METABOLIC PANEL
BUN: 15 mg/dL (ref 6–23)
Chloride: 97 mEq/L (ref 96–112)
Glucose, Bld: 118 mg/dL — ABNORMAL HIGH (ref 70–99)
Potassium: 4.8 mEq/L (ref 3.5–5.1)

## 2012-06-29 NOTE — Patient Instructions (Addendum)
20 [name]  06/29/2012   Your procedure is scheduled on:  Wednesday 07-01-12  Report to [facility] at  AM. 0600  Call this number if you have problems the morning of surgery: [phone]   Remember:   Do not eat food or drink:After Midnight. Tuesday 06-30-12     Take these medicines the morning of surgery with A SIP OF WATER: Rythmol with sip of water   Do not wear jewelry, make-up or nail polish.  Do not wear lotions, powders, or perfumes. You may wear deodorant.  Do not shave 48 hours prior to surgery. Men may shave face and neck.  Do not bring valuables to the hospital.  Contacts, dentures or bridgework may not be worn into surgery.  Leave suitcase in the car. After surgery it may be brought to your room.  For patients admitted to the hospital, checkout time is 11:00 AM the day of discharge.   Patients discharged the day of surgery will not be allowed to drive home.  Name and phone number of your driver:   Special Instructions: CHG Shower Use Special Wash: 1/2 bottle night before surgery and 1/2 bottle morning of surgery.   Please read over the following fact sheets that you were given: MRSA Information, deep breath and cough every other hour after surgery

## 2012-06-30 NOTE — Anesthesia Preprocedure Evaluation (Addendum)
Anesthesia Evaluation  Patient identified by MRN, date of birth, ID band Patient awake    Reviewed: Allergy & Precautions, H&P , NPO status , Patient's Chart, lab work & pertinent test results  Airway Mallampati: III TM Distance: >3 FB Neck ROM: full    Dental  (+) Missing and Dental Advisory Given,    Pulmonary sleep apnea ,  Stop bang 4 breath sounds clear to auscultation  Pulmonary exam normal       Cardiovascular Exercise Tolerance: Good hypertension, Pt. on medications + CAD + dysrhythmias Ventricular Tachycardia Rhythm:regular Rate:Normal  Echo 6/12 EF 55%.  Basal inferolateral akinesis.  Cath. 6/12 non=obstructive CAD.  Bigeminy and non-sustained VT.   Neuro/Psych negative neurological ROS  negative psych ROS   GI/Hepatic negative GI ROS, Neg liver ROS,   Endo/Other  negative endocrine ROS  Renal/GU negative Renal ROS  negative genitourinary   Musculoskeletal   Abdominal   Peds  Hematology negative hematology ROS (+)   Anesthesia Other Findings   Reproductive/Obstetrics negative OB ROS                          Anesthesia Physical Anesthesia Plan  ASA: III  Anesthesia Plan: General   Post-op Pain Management:    Induction: Intravenous  Airway Management Planned: Oral ETT  Additional Equipment:   Intra-op Plan:   Post-operative Plan: Extubation in OR  Informed Consent: I have reviewed the patients History and Physical, chart, labs and discussed the procedure including the risks, benefits and alternatives for the proposed anesthesia with the patient or authorized representative who has indicated his/her understanding and acceptance.   Dental Advisory Given  Plan Discussed with: CRNA and Surgeon  Anesthesia Plan Comments:         Anesthesia Quick Evaluation

## 2012-07-01 ENCOUNTER — Encounter (HOSPITAL_COMMUNITY): Payer: Self-pay | Admitting: Anesthesiology

## 2012-07-01 ENCOUNTER — Ambulatory Visit (HOSPITAL_COMMUNITY): Payer: 59 | Admitting: Anesthesiology

## 2012-07-01 ENCOUNTER — Ambulatory Visit (HOSPITAL_COMMUNITY)
Admission: RE | Admit: 2012-07-01 | Discharge: 2012-07-01 | Disposition: A | Discharge status: Home / Self Care | Payer: 59 | Source: Ambulatory Visit | Attending: Urology | Admitting: Urology

## 2012-07-01 ENCOUNTER — Encounter (HOSPITAL_COMMUNITY): Payer: Self-pay | Admitting: *Deleted

## 2012-07-01 ENCOUNTER — Encounter (HOSPITAL_COMMUNITY)
Admission: RE | Disposition: A | Discharge status: Home / Self Care | Payer: Self-pay | Source: Ambulatory Visit | Attending: Urology

## 2012-07-01 ENCOUNTER — Ambulatory Visit (HOSPITAL_COMMUNITY): Payer: 59

## 2012-07-01 DIAGNOSIS — Z7982 Long term (current) use of aspirin: Secondary | ICD-10-CM | POA: Insufficient documentation

## 2012-07-01 DIAGNOSIS — C679 Malignant neoplasm of bladder, unspecified: Secondary | ICD-10-CM

## 2012-07-01 DIAGNOSIS — Z79899 Other long term (current) drug therapy: Secondary | ICD-10-CM | POA: Insufficient documentation

## 2012-07-01 DIAGNOSIS — I2510 Atherosclerotic heart disease of native coronary artery without angina pectoris: Secondary | ICD-10-CM | POA: Insufficient documentation

## 2012-07-01 DIAGNOSIS — E785 Hyperlipidemia, unspecified: Secondary | ICD-10-CM | POA: Insufficient documentation

## 2012-07-01 DIAGNOSIS — Z8546 Personal history of malignant neoplasm of prostate: Secondary | ICD-10-CM | POA: Insufficient documentation

## 2012-07-01 DIAGNOSIS — N189 Chronic kidney disease, unspecified: Secondary | ICD-10-CM | POA: Insufficient documentation

## 2012-07-01 DIAGNOSIS — N3080 Other cystitis without hematuria: Secondary | ICD-10-CM | POA: Insufficient documentation

## 2012-07-01 DIAGNOSIS — Z01812 Encounter for preprocedural laboratory examination: Secondary | ICD-10-CM | POA: Insufficient documentation

## 2012-07-01 DIAGNOSIS — I129 Hypertensive chronic kidney disease with stage 1 through stage 4 chronic kidney disease, or unspecified chronic kidney disease: Secondary | ICD-10-CM | POA: Insufficient documentation

## 2012-07-01 DIAGNOSIS — N3289 Other specified disorders of bladder: Secondary | ICD-10-CM | POA: Insufficient documentation

## 2012-07-01 DIAGNOSIS — G4730 Sleep apnea, unspecified: Secondary | ICD-10-CM | POA: Insufficient documentation

## 2012-07-01 HISTORY — PX: CYSTOSCOPY: SHX5120

## 2012-07-01 HISTORY — PX: TRANSURETHRAL RESECTION OF BLADDER TUMOR: SHX2575

## 2012-07-01 HISTORY — DX: Malignant neoplasm of bladder, unspecified: C67.9

## 2012-07-01 SURGERY — TURBT (TRANSURETHRAL RESECTION OF BLADDER TUMOR)
Anesthesia: General | Site: Bladder | Wound class: Clean Contaminated

## 2012-07-01 MED ORDER — NEOSTIGMINE METHYLSULFATE 1 MG/ML IJ SOLN
INTRAMUSCULAR | Status: DC | PRN
  Administered 2012-07-01: 5 mg via INTRAVENOUS

## 2012-07-01 MED ORDER — PHENAZOPYRIDINE HCL 100 MG PO TABS: 200.0000 mg | ORAL_TABLET | Freq: Three times a day (TID) | ORAL | Status: DC | PRN

## 2012-07-01 MED ORDER — BELLADONNA ALKALOIDS-OPIUM 16.2-60 MG RE SUPP
RECTAL | Status: AC
  Filled 2012-07-01: qty 1

## 2012-07-01 MED ORDER — LIDOCAINE HCL 2 % EX GEL
CUTANEOUS | Status: AC
  Filled 2012-07-01: qty 10

## 2012-07-01 MED ORDER — CISATRACURIUM BESYLATE (PF) 10 MG/5ML IV SOLN
INTRAVENOUS | Status: DC | PRN
  Administered 2012-07-01: 8 mg via INTRAVENOUS

## 2012-07-01 MED ORDER — HYDROMORPHONE HCL PF 1 MG/ML IJ SOLN: 0.2500 mg | INTRAMUSCULAR | Status: DC | PRN

## 2012-07-01 MED ORDER — CIPROFLOXACIN IN D5W 400 MG/200ML IV SOLN
400.0000 mg | INTRAVENOUS | Status: AC
  Administered 2012-07-01: 400 mg via INTRAVENOUS

## 2012-07-01 MED ORDER — BELLADONNA ALKALOIDS-OPIUM 16.2-60 MG RE SUPP
RECTAL | Status: DC | PRN
  Administered 2012-07-01: 1 via RECTAL

## 2012-07-01 MED ORDER — CIPROFLOXACIN IN D5W 400 MG/200ML IV SOLN
INTRAVENOUS | Status: AC
  Filled 2012-07-01: qty 200

## 2012-07-01 MED ORDER — HYOSCYAMINE SULFATE 0.125 MG PO TABS: 0.1250 mg | ORAL_TABLET | ORAL | Status: DC | PRN

## 2012-07-01 MED ORDER — FENTANYL CITRATE 0.05 MG/ML IJ SOLN
INTRAMUSCULAR | Status: DC | PRN
  Administered 2012-07-01: 50 ug via INTRAVENOUS

## 2012-07-01 MED ORDER — LACTATED RINGERS IV SOLN: INTRAVENOUS | Status: DC

## 2012-07-01 MED ORDER — LACTATED RINGERS IV SOLN
INTRAVENOUS | Status: DC
  Administered 2012-07-01: 11:00:00 via INTRAVENOUS

## 2012-07-01 MED ORDER — STERILE WATER FOR IRRIGATION IR SOLN
Status: DC | PRN
  Administered 2012-07-01: 4000 mL

## 2012-07-01 MED ORDER — LACTATED RINGERS IV SOLN
INTRAVENOUS | Status: DC | PRN
  Administered 2012-07-01: 08:00:00 via INTRAVENOUS

## 2012-07-01 MED ORDER — CIPROFLOXACIN HCL 500 MG PO TABS: 250.0000 mg | ORAL_TABLET | Freq: Two times a day (BID) | ORAL | Status: DC

## 2012-07-01 MED ORDER — GLYCOPYRROLATE 0.2 MG/ML IJ SOLN
INTRAMUSCULAR | Status: DC | PRN
  Administered 2012-07-01: 0.6 mg via INTRAVENOUS
  Administered 2012-07-01: 0.2 mg via INTRAVENOUS

## 2012-07-01 MED ORDER — PROPOFOL 10 MG/ML IV BOLUS
INTRAVENOUS | Status: DC | PRN
  Administered 2012-07-01: 160 mg via INTRAVENOUS

## 2012-07-01 MED ORDER — SODIUM CHLORIDE 0.9 % IR SOLN
Status: DC | PRN
  Administered 2012-07-01: 3000 mL via INTRAVESICAL

## 2012-07-01 MED ORDER — HYDROCODONE-ACETAMINOPHEN 5-325 MG PO TABS
1.0000 | ORAL_TABLET | ORAL | Status: DC | PRN
Start: 2012-07-01 — End: 2013-06-28

## 2012-07-01 SURGICAL SUPPLY — 24 items
BAG URINE DRAINAGE (UROLOGICAL SUPPLIES) ×1 IMPLANT
BAG URO CATCHER STRL LF (DRAPE) ×2 IMPLANT
CATH TIEMANN FOLEY 18FR 5CC (CATHETERS) ×1 IMPLANT
CLOTH BEACON ORANGE TIMEOUT ST (SAFETY) ×2 IMPLANT
DRAPE CAMERA CLOSED 9X96 (DRAPES) ×2 IMPLANT
ELECT REM PT RETURN 9FT ADLT (ELECTROSURGICAL) ×4
ELECTRODE REM PT RTRN 9FT ADLT (ELECTROSURGICAL) ×1 IMPLANT
EVACUATOR MICROVAS BLADDER (UROLOGICAL SUPPLIES) IMPLANT
GLOVE BIOGEL M 7.0 STRL (GLOVE) ×2 IMPLANT
GLOVE BIOGEL PI IND STRL 7.5 (GLOVE) ×2 IMPLANT
GLOVE BIOGEL PI INDICATOR 7.5 (GLOVE) ×2
GLOVE ECLIPSE 7.0 STRL STRAW (GLOVE) ×2 IMPLANT
GLOVE SURG SS PI 6.5 STRL IVOR (GLOVE) ×1 IMPLANT
GOWN PREVENTION PLUS XLARGE (GOWN DISPOSABLE) ×2 IMPLANT
GOWN STRL NON-REIN LRG LVL3 (GOWN DISPOSABLE) ×2 IMPLANT
KIT ASPIRATION TUBING (SET/KITS/TRAYS/PACK) ×1 IMPLANT
LOOPS RESECTOSCOPE DISP (ELECTROSURGICAL) ×1 IMPLANT
MANIFOLD NEPTUNE II (INSTRUMENTS) ×2 IMPLANT
PACK CYSTO (CUSTOM PROCEDURE TRAY) ×2 IMPLANT
SCRUB PCMX 4 OZ (MISCELLANEOUS) ×2 IMPLANT
SYRINGE IRR TOOMEY STRL 70CC (SYRINGE) IMPLANT
TUBING CONNECTING 10 (TUBING) ×2 IMPLANT
WATER STERILE IRR 1000ML UROMA (IV SOLUTION) ×1 IMPLANT
WATER STERILE IRR 3000ML UROMA (IV SOLUTION) ×1 IMPLANT

## 2012-07-01 NOTE — H&P (Signed)
Urology History and Physical Exam  CC: Bladder cancer  HPI: 78 year old male with history of bladder tumor. This was resected and found to be high grade urothelial carcinoma. There was no tissue in the final specimen. We discussed the need for accurate staging and he presents today for repeat transurethral resection of bladder tumor.  He denies any gross hematuria. We have discussed the risks, benefits, alternatives, and likelihood of achieving his goals. I have specifically discussed the very real risk of bladder perforation and the need for open cystorraphy.  His UA from 06/11/12 was negative for signs of infection.  PMH: Past Medical History  Diagnosis Date  . Hyperlipidemia   . Cataract   . Prostate cancer     status post brachytherapy---in 2003  . Ventricular tachycardia, nonsustained/bigeminy     echo 6/12: EF 55%, basal inferolat AK, mild LVH  . Chronic kidney disease   . Arthritis   . Hard of hearing   . Hypertension     LOV with EKG 06/11/11 Dr Graciela Husbands  [facility]  . Bladder cancer   . CAD (coronary artery disease)     cath 02/28/11: dLM 10%, pLAD 20%, pRCA calcified nodule 50% (FFR 0.86 - not significant), mRCA 40-50%, mildly elevated filling pressures, EF 50-55%  . Sleep apnea     STOP BANG SCORE 4    PSH: Past Surgical History  Procedure Date  . Appendectomy   . Cardiac catheterization 6/12  . Skin cancer excision     x 4  . Eye surgery     left cataract extraction with IOL  . Transurethral resection of bladder tumor 05/20/2012    Procedure: TRANSURETHRAL RESECTION OF BLADDER TUMOR (TURBT);  Surgeon: Sebastian Ache, MD;  Location: [facility];  Service: Urology;  Laterality: N/A;  [place]   . Cystoscopy 05/20/2012    Procedure: CYSTOSCOPY;  Surgeon: Sebastian Ache, MD;  Location: [facility];  Service: Urology;  Laterality: N/A;    Allergies: Allergies  Allergen Reactions  . Neosporin (Neomycin-Bacitracin Zn-Polymyx) Other (See Comments)    Makes his skin red   . Penicillins Hives  and Itching    Medications: Prescriptions prior to admission  Medication Sig Dispense Refill  . aspirin 81 MG tablet Take 81 mg by mouth daily.       Marland Kitchen atorvastatin (LIPITOR) 20 MG tablet Take 20 mg by mouth every evening.       . docusate sodium (COLACE) 100 MG capsule Take 100 mg by mouth as needed.      . fish oil-omega-3 fatty acids 1000 MG capsule Take 1 g by mouth 2 (two) times daily.       . hydrochlorothiazide (,MICROZIDE/HYDRODIURIL,) 12.5 MG capsule Take 12.5 mg by mouth daily with breakfast.       . magnesium hydroxide (MILK OF MAGNESIA) 400 MG/5ML suspension Take 10 mLs by mouth daily as needed.      . Multiple Vitamin (MULTIVITAMIN WITH MINERALS) TABS Take 1 tablet by mouth daily.      . Probiotic Product (PHILLIPS COLON HEALTH PO) Take 1 capsule by mouth daily.       . propafenone (RYTHMOL) 225 MG tablet Take 225 mg by mouth 2 (two) times daily.      . ramipril (ALTACE) 10 MG capsule Take 20 mg by mouth daily with breakfast.       . senna-docusate (SENOKOT-S) 8.6-50 MG per tablet Take 1 tablet by mouth as needed. While taking pain meds to prevent constipation      .  zinc gluconate 50 MG tablet Take 50 mg by mouth daily.      Marland Kitchen acetaminophen (TYLENOL) 500 MG tablet Take 500 mg by mouth every 6 (six) hours as needed.          Social History: History   Social History  . Marital Status: Married    Spouse Name: N/A    Number of Children: N/A  . Years of Education: N/A   Occupational History  . Not on file.   Social History Main Topics  . Smoking status: Former Smoker    Types: Cigars    Quit date: 05/20/1995  . Smokeless tobacco: Current User    Types: Chew  . Alcohol Use: Yes     6 beer week  . Drug Use: No  . Sexually Active: Not on file   Other Topics Concern  . Not on file   Social History Narrative   He lives at home with his wife. He is remarried. He lost his daughter at age of 71 to lung cancer. He does not smoke. Uses alcohol occasionally. Denies use  of recreational drugs    Family History: History reviewed. No pertinent family history.  Review of Systems: Positive: None Negative: Chest pain, SOB, fever.  A further 10 point review of systems was negative except what is listed in the HPI.  Physical Exam: Filed Vitals:   07/01/12 0734  BP: 129/77  Pulse: 76  Temp: 98.9 F (37.2 C)  Resp: 20    General: No acute distress.  Awake. Head:  Normocephalic.  Atraumatic. ENT:  EOMI.  Mucous membranes moist Neck:  Supple.  No lymphadenopathy. Pulmonary: Equal effort bilaterally.  Clear to auscultation bilaterally. Abdomen: Soft.  Non- tender to palpation. Skin:  Normal turgor.  No visible rash. Extremity: No gross deformity of bilateral upper extremities.  No gross deformity of    bilateral lower extremities. Neurologic: Alert. Appropriate mood.   Studies:  Recent Labs  Basename 06/29/12 1400   HGB 15.3   WBC 9.5   PLT 217    Recent Labs  Basename 06/29/12 1400   NA 135   K 4.8   CL 97   CO2 30   BUN 15   CREATININE 1.04   CALCIUM 9.8   GFRNONAA 64*   GFRAA 75*     No results found for this basename: PT:2,INR:2,APTT:2 in the last 72 hours   No components found with this basename: ABG:2    Assessment:  Bladder cancer   Plan: -To OR for cystoscopy and transurethral resection of bladder tumor.

## 2012-07-01 NOTE — Progress Notes (Signed)
Pt sitting up on side of stretcher tolerating well.  Pt walked to bathroom with standby assist and tolerated well.  Pt attempted to voiding stating he had the urge to urinate.  Pt was unsuccessful/unable to void.  Assisted pt back to bed to rest.

## 2012-07-01 NOTE — Anesthesia Postprocedure Evaluation (Signed)
  Anesthesia Post-op Note  Patient: [name]  Procedure(s) Performed: Procedure(s) (LRB): TRANSURETHRAL RESECTION OF BLADDER TUMOR (TURBT) (N/A) CYSTOSCOPY (N/A)  Patient Location: PACU  Anesthesia Type: General  Level of Consciousness: awake and alert   Airway and Oxygen Therapy: Patient Spontanous Breathing  Post-op Pain: mild  Post-op Assessment: Post-op Vital signs reviewed, Patient's Cardiovascular Status Stable, Respiratory Function Stable, Patent Airway and No signs of Nausea or vomiting  Post-op Vital Signs: stable  Complications: No apparent anesthesia complications

## 2012-07-01 NOTE — Brief Op Note (Signed)
07/01/2012  9:36 AM  PATIENT:  [name]  78 y.o. male  PRE-OPERATIVE DIAGNOSIS:  BLADDER CANCER  POST-OPERATIVE DIAGNOSIS:  BLADDER CANCER  PROCEDURE:  Procedure(s) (LRB) with comments: TRANSURETHRAL RESECTION OF BLADDER TUMOR (TURBT) (N/A) - Cystoscopy, TURBT, be prepared for Open Cystorraphy     CYSTOSCOPY (N/A)  SURGEON:  Surgeon(s) and Role:    * Milford Cage, MD - Primary  PHYSICIAN ASSISTANT:   ASSISTANTS: none   ANESTHESIA:   general  EBL:   None  BLOOD ADMINISTERED:none  DRAINS: none   LOCAL MEDICATIONS USED:  LIDOCAINE , Amount: 10 ml and OTHER B&O suppository.  SPECIMEN:  Source of Specimen:  Bladder biopsy.  DISPOSITION OF SPECIMEN:  PATHOLOGY  COUNTS:  YES  TOURNIQUET:  * No tourniquets in log *  DICTATION: .Other Dictation: Dictation Number [id number]  PLAN OF CARE: Discharge to home after PACU  PATIENT DISPOSITION:  PACU - hemodynamically stable.   Delay start of Pharmacological VTE agent (>24hrs) due to surgical blood loss or risk of bleeding: yes

## 2012-07-01 NOTE — Op Note (Unsigned)
NAME:  [name], FAULKENBERRY NO.:  [id number]  MEDICAL RECORD NO.:  [id number]  LOCATION:  [facility]                         FACILITY:  [facility]  PHYSICIAN:  Natalia Leatherwood, MD    DATE OF BIRTH:  03-05-1927  DATE OF PROCEDURE: DATE OF DISCHARGE:  07/01/2012                              OPERATIVE REPORT   PREOPERATIVE DIAGNOSIS:  Bladder cancer.  POSTOPERATIVE DIAGNOSIS:  Bladder cancer.  PROCEDURES PERFORMED: 1. Cystoscopy. 2. Bladder biopsy.  COMPLICATIONS:  None.  DRAINS:  None.  FINDINGS:  Previous tumor resection site at the dome of the bladder. There were also two areas on the floor of the bladder medial to each ureteral orifice that was somewhat lumped up and mounded.  Biopsy was taken of these areas as well.  SPECIMEN:  Bladder biopsy sent for permanent pathology.  COMPLICATIONS:  None.  DRAINS:  None.  HISTORY OF PRESENT ILLNESS:  This is an 78 year old gentleman who was found to have a bladder tumor based on the hematuria workup.  He had transurethral resection of bladder tumor, which revealed high-grade urothelial carcinoma.  There was no muscle specimen in the pathology of the original resection and he presents today for bladder biopsy for staging purposes.  We have reviewed the risks and benefits.  PROCEDURE:  After informed consent was obtained, the patient was taken to the operating room where he was placed in a supine position.  IV antibiotics were infused and general anesthesia was induced.  SCDs were turned on and in place.  Hair was removed from his lower abdomen.  He was then placed in dorsal lithotomy position making sure to pad all pertinent neurovascular pressure points appropriately.  His genitals were prepped and draped in usual sterile fashion.  Time-out was performed, which the correct patient, surgical site, and procedure were identified and agreed upon by the team.  Rigid cystoscope was advanced to the urethra into the bladder.   The bladder was evaluated in systematic fashion after distending the bladder fully with a 12-degree and 70-degree lens.  There were two heaped up areas on the base of the bladder floor that were medial to the each ureteral orifice.  There was also erythema at the site of the previous resection on the dome of the bladder.  Due to the location of the site of the original bladder tumor, I felt it would be best to perform cold cup biopsies and therefore, biopsies were obtained at the dome of the bladder at the tumor site.  I did obtain deep biopsies and I was able to see muscle tissue.  There was not any perforation and there were not any visible fat seen after the biopsies.  Bugbee electrode was used to fulgurate the biopsy site.  Next, biopsy was taken of the bladder floor areas and Bugbee electrode was used to fulgurate these as well.  These were well away from the ureteral orifice.  The dome was sent as separate biopsies from the floor of the bladder.  After this was done, the bladder was drained and 10 mL of lidocaine jelly were placed into the patient's urethra.  Belladonna and opium suppository was placed into the rectum.  This completed  the procedure.  He was placed back in the supine position, anesthesia was reversed, and he was taken to the PACU in stable condition.  The patient will follow up with me as scheduled as an outpatient.  We will see what the results of the biopsy pathology shows and make further treatment recommendations based upon this.          ______________________________ Natalia Leatherwood, MD     DW/MEDQ  D:  07/01/2012  T:  07/01/2012  Job:  [id number]

## 2012-07-01 NOTE — Transfer of Care (Signed)
Immediate Anesthesia Transfer of Care Note  Patient: [name]  Procedure(s) Performed: Procedure(s) (LRB) with comments: TRANSURETHRAL RESECTION OF BLADDER TUMOR (TURBT) (N/A) - Cystoscopy, TURBT, be prepared for Open Cystorraphy     CYSTOSCOPY (N/A)  Patient Location: PACU  Anesthesia Type: General  Level of Consciousness: awake, alert , sedated and patient cooperative  Airway & Oxygen Therapy: Patient Spontanous Breathing and Patient connected to face mask oxygen  Post-op Assessment: Report given to PACU RN and Post -op Vital signs reviewed and stable  Post vital signs: Reviewed and stable  Complications: No apparent anesthesia complications

## 2012-07-01 NOTE — Progress Notes (Signed)
Pt states his throat is irritated.  Pt can swallow with mild irritation.  Encourage to drink plenty of water and use throat lozenges as needed.  I also encouraged him to call office or come to ER if he has difficulty drinking or feels like it is worsening with difficulty swallowing. Pt and pt's wife verbalized understanding.  Pt has no difficulty breathing, no Shortness of breath.

## 2012-07-01 NOTE — Progress Notes (Signed)
Informed Dr. Margarita Grizzle that pt continues to dribble urine without consistent flow and pt has complaints of urgency and pressure.  Pt bladder scanned and was scan noted in bladder and Dr. Margarita Grizzle informed.  Foley cath ordered for discharge and will be dc'd at pt' s f/u visit per Dr. Margarita Grizzle.

## 2012-07-02 ENCOUNTER — Encounter (HOSPITAL_COMMUNITY): Payer: Self-pay | Admitting: Urology

## 2012-08-26 ENCOUNTER — Other Ambulatory Visit: Payer: Self-pay | Admitting: *Deleted

## 2012-08-26 MED ORDER — PROPAFENONE HCL 225 MG PO TABS: 225.0000 mg | ORAL_TABLET | Freq: Two times a day (BID) | ORAL | Status: DC

## 2012-09-16 HISTORY — PX: CATARACT EXTRACTION W/ INTRAOCULAR LENS IMPLANT: SHX1309

## 2013-04-29 ENCOUNTER — Other Ambulatory Visit: Payer: Self-pay | Admitting: Dermatology

## 2013-05-28 ENCOUNTER — Other Ambulatory Visit: Payer: Self-pay | Admitting: Internal Medicine

## 2013-06-24 ENCOUNTER — Other Ambulatory Visit: Payer: Self-pay | Admitting: Internal Medicine

## 2013-06-28 ENCOUNTER — Ambulatory Visit (INDEPENDENT_AMBULATORY_CARE_PROVIDER_SITE_OTHER): Payer: 59 | Admitting: Internal Medicine

## 2013-06-28 ENCOUNTER — Encounter: Payer: Self-pay | Admitting: Internal Medicine

## 2013-06-28 VITALS — BP 157/87 | HR 77 | Ht 69.0 in | Wt 197.8 lb

## 2013-06-28 DIAGNOSIS — I25810 Atherosclerosis of coronary artery bypass graft(s) without angina pectoris: Secondary | ICD-10-CM

## 2013-06-28 DIAGNOSIS — I498 Other specified cardiac arrhythmias: Secondary | ICD-10-CM

## 2013-06-28 DIAGNOSIS — I440 Atrioventricular block, first degree: Secondary | ICD-10-CM

## 2013-06-28 DIAGNOSIS — I10 Essential (primary) hypertension: Secondary | ICD-10-CM

## 2013-06-28 HISTORY — DX: Atrioventricular block, first degree: I44.0

## 2013-06-28 NOTE — Assessment & Plan Note (Signed)
The patient is without symptomatic oral echocardiographic ectopy. His exercise tolerance is quite good. I am concerned about the interval prolongation of his PR interval. We will follow this closely. It is potentially related to the presence of his 1C antiarrhythmic and we may need to discontinue it.

## 2013-06-28 NOTE — Progress Notes (Signed)
Patient Care Team: Catha Gosselin, MD as PCP - General (Family Medicine)   HPI  [name] is a 79 y.o. 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 He is nonobstructive coronary disease. It was elected to put him on flecainide for suppression but then changed to Rythmol because of the coronary disease.    His bladder cancer seems to be in remission; he had cystoscopy last week   The patient denies chest pain, shortness of breath, nocturnal dyspnea, orthopnea or peripheral edema. There have been no palpitations, lightheadedness or syncope.   Blood work is   available  10/13 when his BUN creatinine and potassium were normal. See his PCP later this month for annual checkup.   Past Medical History  Diagnosis Date  . Hyperlipidemia   . Cataract   . Prostate cancer     status post brachytherapy---in 2003  . Ventricular tachycardia, nonsustained/bigeminy     echo 6/12: EF 55%, basal inferolat AK, mild LVH  . Chronic kidney disease   . Arthritis   . Hard of hearing   . Hypertension     LOV with EKG 06/11/11 Dr Graciela Husbands  [facility]  . Bladder cancer   . CAD (coronary artery disease)     cath 02/28/11: dLM 10%, pLAD 20%, pRCA calcified nodule 50% (FFR 0.86 - not significant), mRCA 40-50%, mildly elevated filling pressures, EF 50-55%  . Sleep apnea     STOP BANG SCORE 4    Past Surgical History  Procedure Laterality Date  . Appendectomy    . Cardiac catheterization  6/12  . Skin cancer excision      x 4  . Eye surgery      left cataract extraction with IOL  . Transurethral resection of bladder tumor  05/20/2012    Procedure: TRANSURETHRAL RESECTION OF BLADDER TUMOR (TURBT);  Surgeon: Sebastian Ache, MD;  Location: [facility];  Service: Urology;  Laterality: N/A;  [place]   . Cystoscopy  05/20/2012    Procedure: CYSTOSCOPY;  Surgeon: Sebastian Ache, MD;  Location: [facility];  Service: Urology;  Laterality: N/A;  . Transurethral resection of  bladder tumor  07/01/2012    Procedure: TRANSURETHRAL RESECTION OF BLADDER TUMOR (TURBT);  Surgeon: Milford Cage, MD;  Location: [facility];  Service: Urology;  Laterality: N/A;  Cystoscopy, TURBT, be prepared for Open Cystorraphy      . Cystoscopy  07/01/2012    Procedure: CYSTOSCOPY;  Surgeon: Milford Cage, MD;  Location: [facility];  Service: Urology;  Laterality: N/A;    Current Outpatient Prescriptions  Medication Sig Dispense Refill  . acetaminophen (TYLENOL) 500 MG tablet Take 500 mg by mouth every 6 (six) hours as needed.       Marland Kitchen atorvastatin (LIPITOR) 20 MG tablet Take 20 mg by mouth every evening.       . docusate sodium (COLACE) 100 MG capsule Take 100 mg by mouth as needed.      . hydrochlorothiazide (,MICROZIDE/HYDRODIURIL,) 12.5 MG capsule Take 12.5 mg by mouth daily with breakfast.       . Multiple Vitamin (MULTIVITAMIN WITH MINERALS) TABS Take 1 tablet by mouth daily.      . Probiotic Product (PHILLIPS COLON HEALTH PO) Take 1 capsule by mouth daily.       . propafenone (RYTHMOL) 225 MG tablet TAKE 1 TABLET (225 MG TOTAL) BY MOUTH 2 (TWO) TIMES DAILY.  180 tablet  0  . ramipril (ALTACE) 10 MG capsule  Take 20 mg by mouth daily with breakfast.       . zinc gluconate 50 MG tablet Take 50 mg by mouth daily.       No current facility-administered medications for this visit.    Allergies  Allergen Reactions  . Neosporin [Neomycin-Bacitracin Zn-Polymyx] Other (See Comments)    Makes his skin red   . Penicillins Hives and Itching    Review of Systems negative except from HPI and PMH  Physical Exam BP 157/87  Pulse 77  Ht 5\' 9"  (1.753 m)  Wt 197 lb 12.8 oz (89.721 kg)  BMI 29.2 kg/m2  Well developed and nourished in no acute distress HENT normal Neck supple with JVP-flat Clear Regular rate and rhythm, no murmurs or gallops Abd-soft with active BS No Clubbing cyanosis edema Skin-warm and dry A & Oriented  Grossly normal sensory and motor  function .  ECG sinus at 77 Intervals 31/11/39  Assessment and  Plan

## 2013-06-28 NOTE — Assessment & Plan Note (Signed)
As above.

## 2013-06-28 NOTE — Patient Instructions (Addendum)
Your physician wants you to follow-up in: 6 months with Dr. Klein. You will receive a reminder letter in the mail two months in advance. If you don't receive a letter, please call our office to schedule the follow-up appointment.  Your physician recommends that you continue on your current medications as directed. Please refer to the Current Medication list given to you today.  

## 2013-06-28 NOTE — Assessment & Plan Note (Signed)
Blood pressure remains elevated. Will follow her closely.

## 2013-08-18 ENCOUNTER — Other Ambulatory Visit: Payer: Self-pay | Admitting: Internal Medicine

## 2013-08-30 IMAGING — US US ABDOMEN COMPLETE
1 series · 14 of 25 positions shown · non-contrast
Comparison: None

CLINICAL DATA: Abdominal pain.

COMPLETE ABDOMINAL ULTRASOUND

[Series 1: us abdomen complete · 0.26mm/px · 14 of 91 slices shown]
[im 1/91]
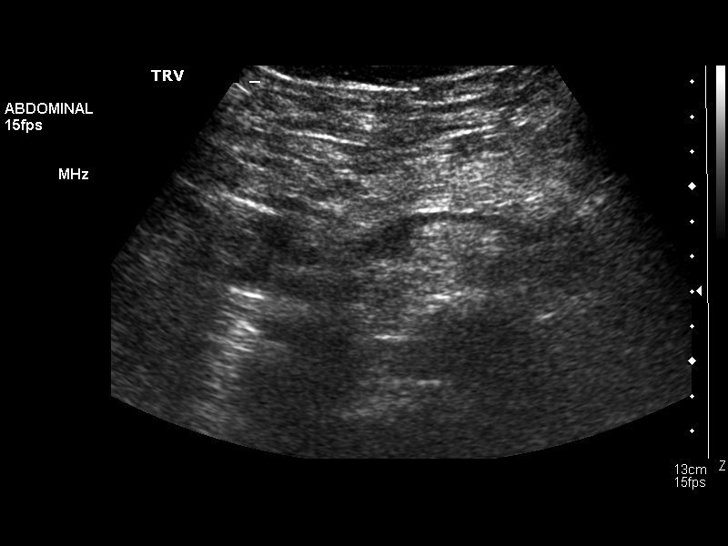
[im 8/91]
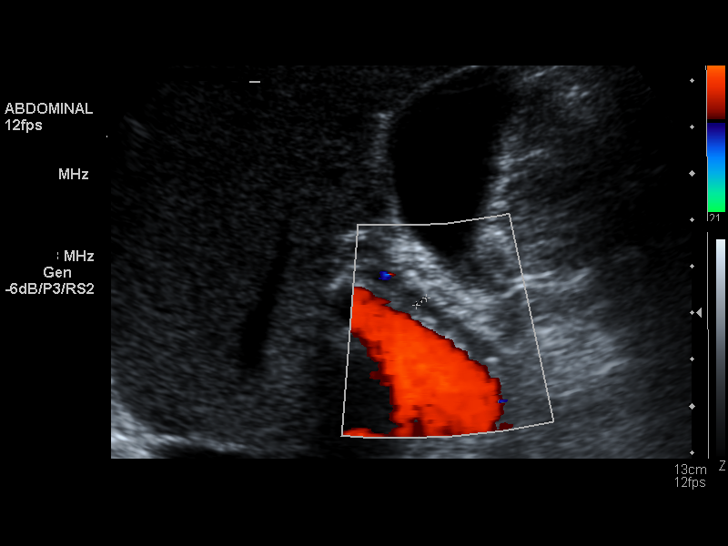
[im 16/91]
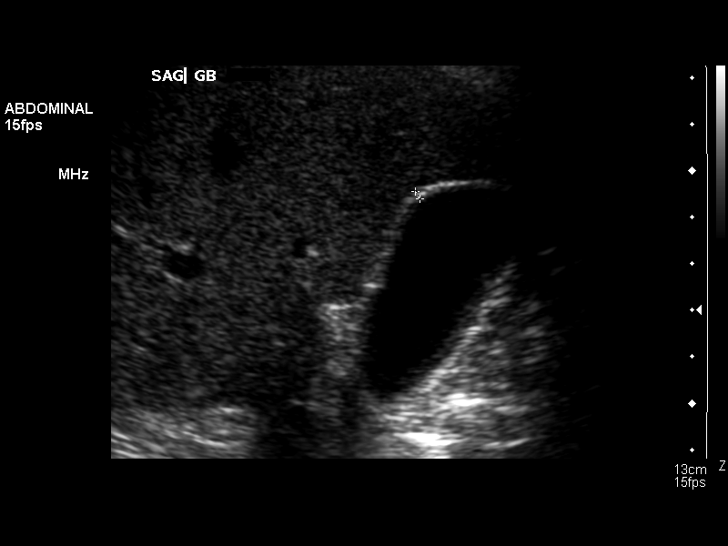
[im 23/91]
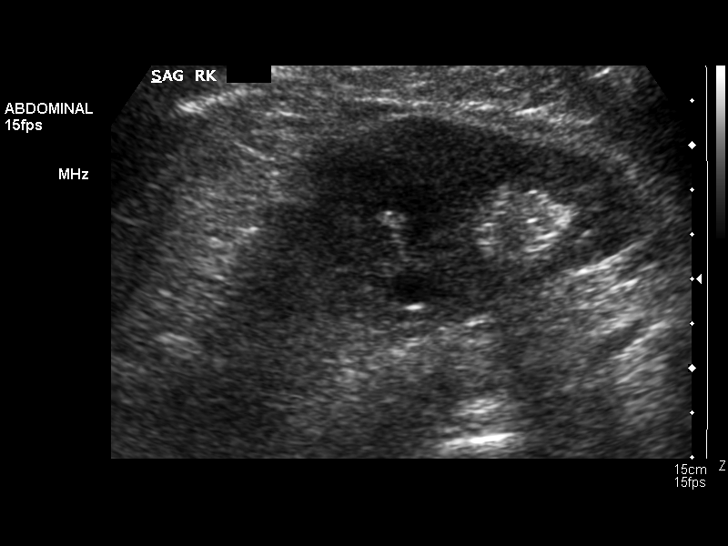
[im 31/91]
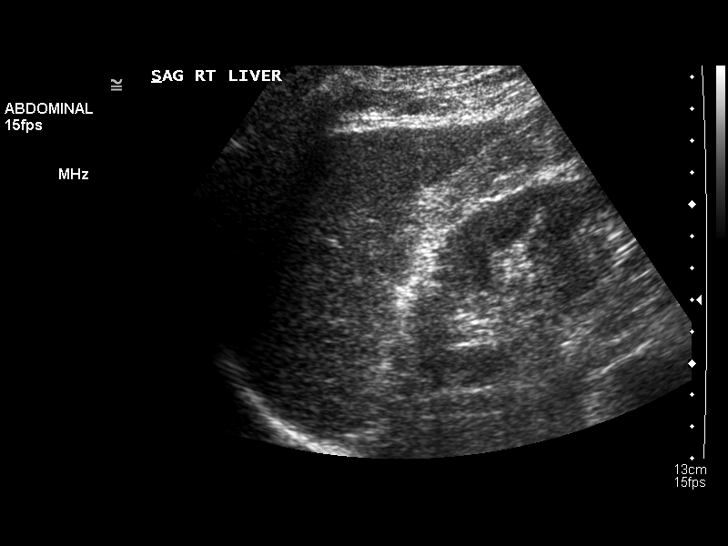
[im 34/91]
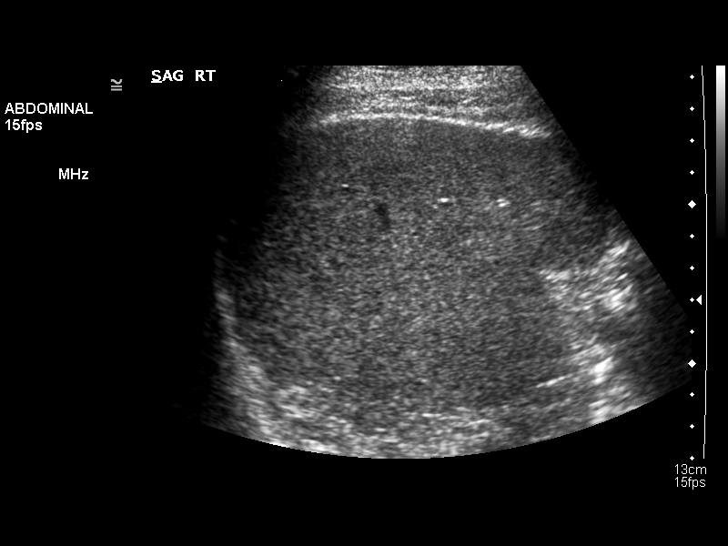
[im 42/91]
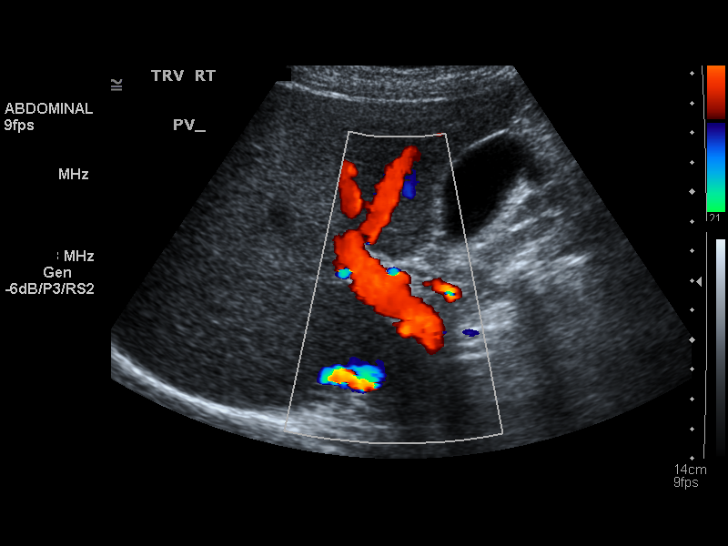
[im 49/91]
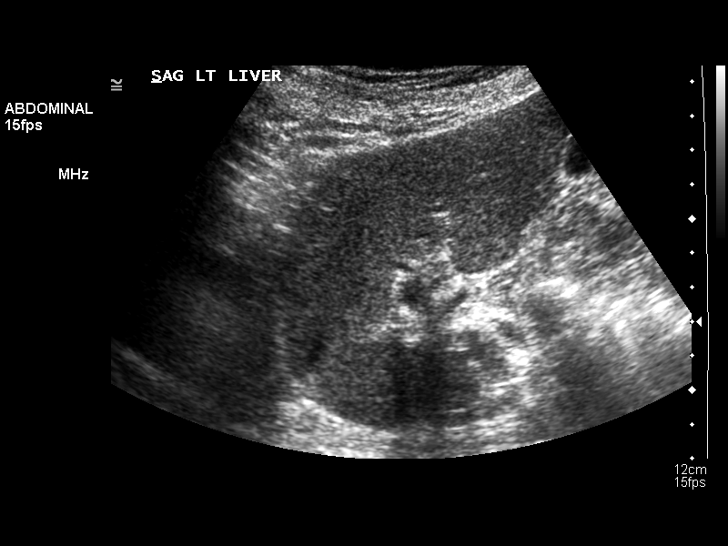
[im 57/91]
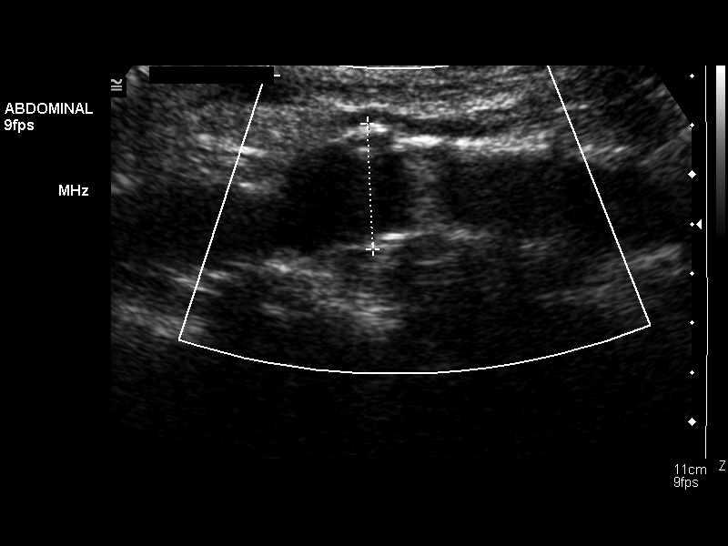
[im 61/91]
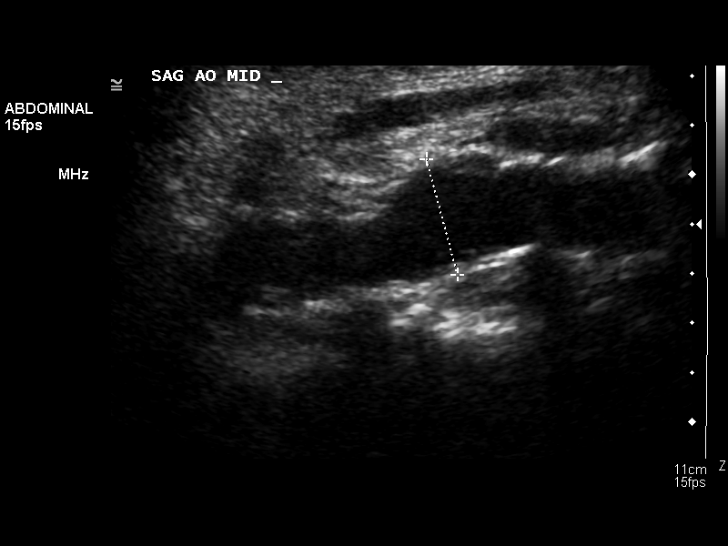
[im 68/91]
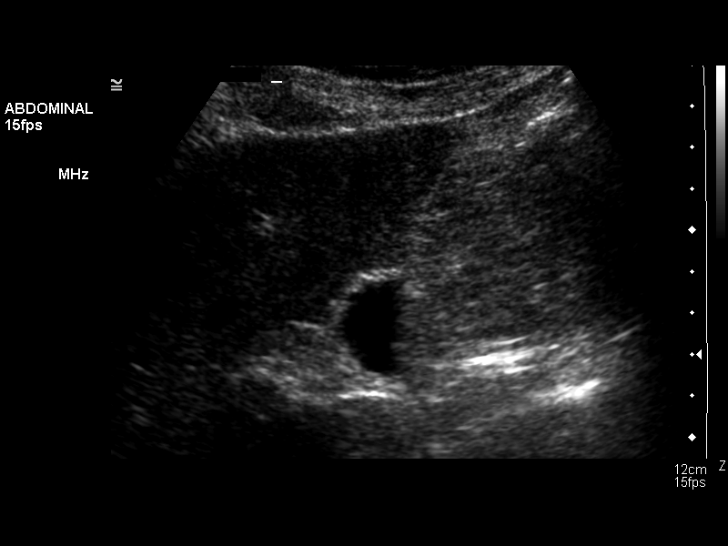
[im 76/91]
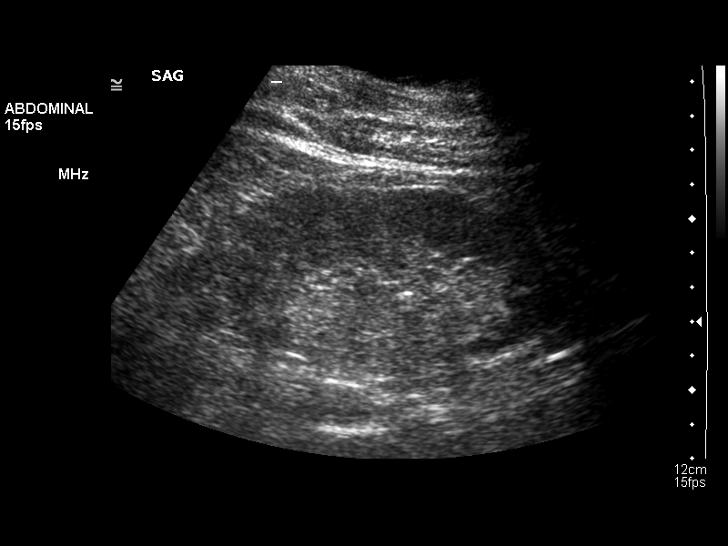
[im 83/91]
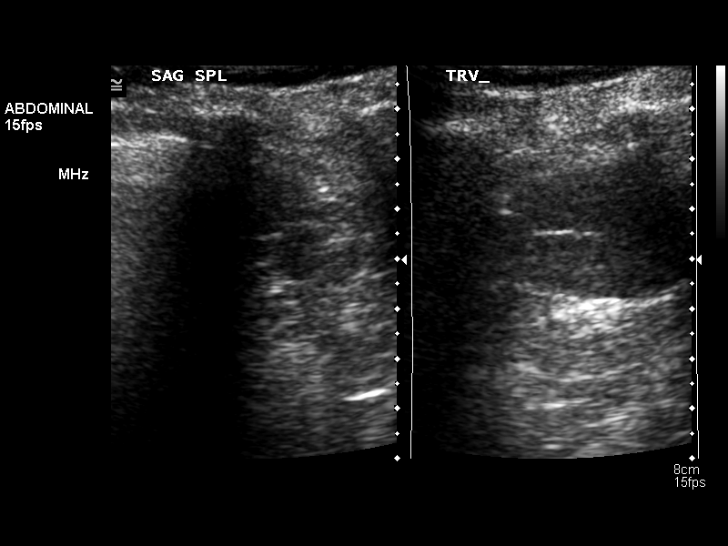
[im 91/91]
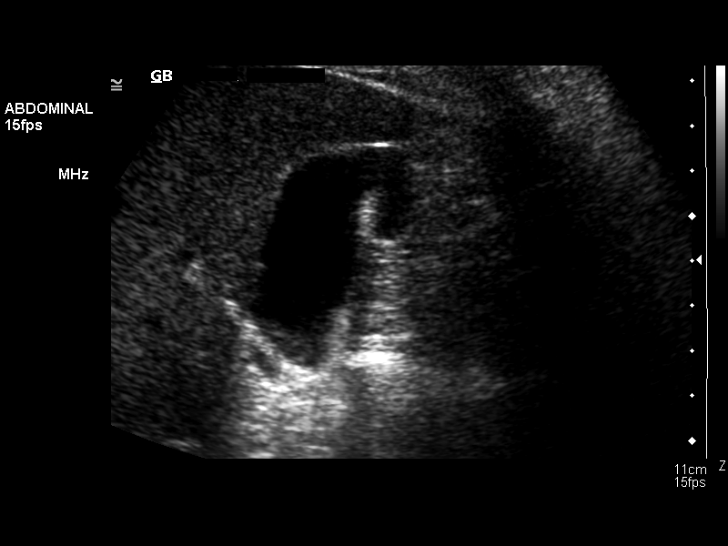

[14 of 25 positions shown; findings below may reference images not displayed]

FINDINGS: Gallbladder:  No gallstones, gallbladder wall thickening, or
pericholecystic fluid.

Common bile duct:   Normal caliber, 3 mm.

Liver:  No focal lesion identified.  Within normal limits in
parenchymal echogenicity.

IVC:  Appears normal.

Pancreas:  No focal abnormality seen.

Spleen:  Within normal limits in size and echotexture.

Right Kidney:   11 mm lateral cyst which appears benign.  No
hydronephrosis.  Normal size and echotexture.

Left Kidney:  Normal in size and parenchymal echogenicity.  No
evidence of mass or hydronephrosis.

Abdominal aorta:  Mild focal bulge in the mid abdominal aorta
measuring maximally 2.5 cm.  Atherosclerotic irregularity.
IMPRESSION: No acute findings in the abdomen.

## 2013-09-23 DIAGNOSIS — Z8551 Personal history of malignant neoplasm of bladder: Secondary | ICD-10-CM | POA: Diagnosis not present

## 2013-10-05 DIAGNOSIS — C679 Malignant neoplasm of bladder, unspecified: Secondary | ICD-10-CM | POA: Diagnosis not present

## 2013-10-12 DIAGNOSIS — C679 Malignant neoplasm of bladder, unspecified: Secondary | ICD-10-CM | POA: Diagnosis not present

## 2013-10-19 DIAGNOSIS — C679 Malignant neoplasm of bladder, unspecified: Secondary | ICD-10-CM | POA: Diagnosis not present

## 2013-12-21 DIAGNOSIS — L408 Other psoriasis: Secondary | ICD-10-CM | POA: Diagnosis not present

## 2013-12-21 DIAGNOSIS — L723 Sebaceous cyst: Secondary | ICD-10-CM | POA: Diagnosis not present

## 2014-01-11 ENCOUNTER — Encounter: Payer: Self-pay | Admitting: Internal Medicine

## 2014-01-11 ENCOUNTER — Ambulatory Visit (INDEPENDENT_AMBULATORY_CARE_PROVIDER_SITE_OTHER): Payer: 59 | Admitting: Internal Medicine

## 2014-01-11 VITALS — BP 127/89 | HR 62 | Ht 69.0 in | Wt 200.0 lb

## 2014-01-11 DIAGNOSIS — I498 Other specified cardiac arrhythmias: Secondary | ICD-10-CM | POA: Diagnosis not present

## 2014-01-11 DIAGNOSIS — I440 Atrioventricular block, first degree: Secondary | ICD-10-CM | POA: Diagnosis not present

## 2014-01-11 DIAGNOSIS — I499 Cardiac arrhythmia, unspecified: Principal | ICD-10-CM

## 2014-01-11 NOTE — Patient Instructions (Signed)
Your physician recommends that you continue on your current medications as directed. Please refer to the Current Medication list given to you today.  Your physician wants you to follow-up in: 1 year with Dr. Klein.  You will receive a reminder letter in the mail two months in advance. If you don't receive a letter, please call our office to schedule the follow-up appointment.  

## 2014-01-11 NOTE — Progress Notes (Signed)
Patient Care Team: Hulan Fess, MD as PCP - General (Family Medicine)   HPI  [name] is a 80 y.o. male Seen in followup for ventricular ectopy associated with functional bradycardia;  he has been treated with propafenone The patient denies chest pain, shortness of breath, nocturnal dyspnea, orthopnea or peripheral edema.  There have been no palpitations, lightheadedness or syncope.   His bladder cancer remains  in remission; his wife  has just been diagnosed with lymphoma  Past Medical History  Diagnosis Date  . Hyperlipidemia   . Cataract   . Prostate cancer     status post brachytherapy---in 2003  . Ventricular tachycardia, nonsustained/bigeminy     echo 6/12: EF 55%, basal inferolat AK, mild LVH  . Chronic kidney disease   . Arthritis   . Hard of hearing   . Hypertension     LOV with EKG 06/11/11 Dr Caryl Comes  [facility]  . Bladder cancer   . CAD (coronary artery disease)     cath 02/28/11: dLM 10%, pLAD 20%, pRCA calcified nodule 50% (FFR 0.86 - not significant), mRCA 40-50%, mildly elevated filling pressures, EF 50-55%  . Sleep apnea     STOP BANG SCORE 4    Past Surgical History  Procedure Laterality Date  . Appendectomy    . Cardiac catheterization  6/12  . Skin cancer excision      x 4  . Eye surgery      left cataract extraction with IOL  . Transurethral resection of bladder tumor  05/20/2012    Procedure: TRANSURETHRAL RESECTION OF BLADDER TUMOR (TURBT);  Surgeon: Alexis Frock, MD;  Location: [facility];  Service: Urology;  Laterality: N/A;  [place]   . Cystoscopy  05/20/2012    Procedure: CYSTOSCOPY;  Surgeon: Alexis Frock, MD;  Location: [facility];  Service: Urology;  Laterality: N/A;  . Transurethral resection of bladder tumor  07/01/2012    Procedure: TRANSURETHRAL RESECTION OF BLADDER TUMOR (TURBT);  Surgeon: Molli Hazard, MD;  Location: [facility];  Service: Urology;  Laterality: N/A;  Cystoscopy, TURBT, be prepared for Open Cystorraphy      .  Cystoscopy  07/01/2012    Procedure: CYSTOSCOPY;  Surgeon: Molli Hazard, MD;  Location: [facility];  Service: Urology;  Laterality: N/A;    Current Outpatient Prescriptions  Medication Sig Dispense Refill  . acetaminophen (TYLENOL) 500 MG tablet Take 500 mg by mouth every 6 (six) hours as needed.       Marland Kitchen atorvastatin (LIPITOR) 20 MG tablet Take 20 mg by mouth every evening.       . docusate sodium (COLACE) 100 MG capsule Take 100 mg by mouth as needed.      . hydrochlorothiazide (,MICROZIDE/HYDRODIURIL,) 12.5 MG capsule Take 12.5 mg by mouth daily with breakfast.       . Multiple Vitamin (MULTIVITAMIN WITH MINERALS) TABS Take 1 tablet by mouth daily.      . Probiotic Product (PHILLIPS COLON HEALTH PO) Take 1 capsule by mouth daily.       . propafenone (RYTHMOL) 225 MG tablet TAKE 1 TABLET BY MOUTH TWICE A DAY  180 tablet  1  . ramipril (ALTACE) 10 MG capsule Take 20 mg by mouth daily with breakfast.       . zinc gluconate 50 MG tablet Take 50 mg by mouth daily.       No current facility-administered medications for this visit.    Allergies  Allergen Reactions  . Neosporin [  Neomycin-Bacitracin Zn-Polymyx] Other (See Comments)    Makes his skin red   . Penicillins Hives and Itching    Review of Systems negative except from HPI and PMH  Physical Exam BP 127/89  Pulse 62  Ht 5\' 9"  (1.753 m)  Wt 200 lb (90.719 kg)  BMI 29.52 kg/m2 Well developed and well nourished in no acute distress HENT normal E scleral and icterus clear Neck Supple JVP flat; carotids brisk and full Clear to ausculation  Regular rate and rhythm, no murmurs gallops or rub Soft with active bowel sounds No clubbing cyanosis  Edema Alert and oriented, grossly normal motor and sensory function Skin Warm and Dry  ECG  SR at55 with short runs of atrial tach/PACs  Assessment and  Plan  PVC  PACs/Atrial Tach  Without complaints     Blood work by PCP last fall and told normal  Will not draw blood  today

## 2014-02-02 ENCOUNTER — Other Ambulatory Visit: Payer: Self-pay | Admitting: Internal Medicine

## 2014-04-12 DIAGNOSIS — C679 Malignant neoplasm of bladder, unspecified: Secondary | ICD-10-CM | POA: Diagnosis not present

## 2014-04-19 DIAGNOSIS — C679 Malignant neoplasm of bladder, unspecified: Secondary | ICD-10-CM | POA: Diagnosis not present

## 2014-04-26 DIAGNOSIS — C679 Malignant neoplasm of bladder, unspecified: Secondary | ICD-10-CM | POA: Diagnosis not present

## 2014-05-12 DIAGNOSIS — Z85828 Personal history of other malignant neoplasm of skin: Secondary | ICD-10-CM | POA: Diagnosis not present

## 2014-05-12 DIAGNOSIS — L570 Actinic keratosis: Secondary | ICD-10-CM | POA: Diagnosis not present

## 2014-05-12 DIAGNOSIS — L821 Other seborrheic keratosis: Secondary | ICD-10-CM | POA: Diagnosis not present

## 2014-05-12 DIAGNOSIS — D239 Other benign neoplasm of skin, unspecified: Secondary | ICD-10-CM | POA: Diagnosis not present

## 2014-06-24 DIAGNOSIS — Z126 Encounter for screening for malignant neoplasm of bladder: Secondary | ICD-10-CM | POA: Diagnosis not present

## 2014-06-24 DIAGNOSIS — Z8546 Personal history of malignant neoplasm of prostate: Secondary | ICD-10-CM | POA: Diagnosis not present

## 2014-06-24 DIAGNOSIS — Z8551 Personal history of malignant neoplasm of bladder: Secondary | ICD-10-CM | POA: Diagnosis not present

## 2014-06-27 DIAGNOSIS — C679 Malignant neoplasm of bladder, unspecified: Secondary | ICD-10-CM | POA: Diagnosis not present

## 2014-06-27 DIAGNOSIS — Z8546 Personal history of malignant neoplasm of prostate: Secondary | ICD-10-CM | POA: Diagnosis not present

## 2014-06-27 DIAGNOSIS — Z8551 Personal history of malignant neoplasm of bladder: Secondary | ICD-10-CM | POA: Diagnosis not present

## 2014-07-19 DIAGNOSIS — C679 Malignant neoplasm of bladder, unspecified: Secondary | ICD-10-CM | POA: Diagnosis not present

## 2014-07-26 DIAGNOSIS — C679 Malignant neoplasm of bladder, unspecified: Secondary | ICD-10-CM | POA: Diagnosis not present

## 2014-07-30 ENCOUNTER — Other Ambulatory Visit: Payer: Self-pay | Admitting: Internal Medicine

## 2014-08-02 DIAGNOSIS — Z5111 Encounter for antineoplastic chemotherapy: Secondary | ICD-10-CM | POA: Diagnosis not present

## 2014-08-02 DIAGNOSIS — C679 Malignant neoplasm of bladder, unspecified: Secondary | ICD-10-CM | POA: Diagnosis not present

## 2014-08-09 DIAGNOSIS — Z5111 Encounter for antineoplastic chemotherapy: Secondary | ICD-10-CM | POA: Diagnosis not present

## 2014-08-09 DIAGNOSIS — C679 Malignant neoplasm of bladder, unspecified: Secondary | ICD-10-CM | POA: Diagnosis not present

## 2014-08-16 DIAGNOSIS — C679 Malignant neoplasm of bladder, unspecified: Secondary | ICD-10-CM | POA: Diagnosis not present

## 2014-08-16 DIAGNOSIS — Z5111 Encounter for antineoplastic chemotherapy: Secondary | ICD-10-CM | POA: Diagnosis not present

## 2014-08-23 DIAGNOSIS — Z5111 Encounter for antineoplastic chemotherapy: Secondary | ICD-10-CM | POA: Diagnosis not present

## 2014-08-23 DIAGNOSIS — C679 Malignant neoplasm of bladder, unspecified: Secondary | ICD-10-CM | POA: Diagnosis not present

## 2014-08-24 DIAGNOSIS — L570 Actinic keratosis: Secondary | ICD-10-CM | POA: Diagnosis not present

## 2014-08-24 DIAGNOSIS — L821 Other seborrheic keratosis: Secondary | ICD-10-CM | POA: Diagnosis not present

## 2014-09-16 HISTORY — PX: CATARACT EXTRACTION W/ INTRAOCULAR LENS IMPLANT: SHX1309

## 2014-11-03 DIAGNOSIS — N183 Chronic kidney disease, stage 3 (moderate): Secondary | ICD-10-CM | POA: Diagnosis not present

## 2014-11-03 DIAGNOSIS — I10 Essential (primary) hypertension: Secondary | ICD-10-CM | POA: Diagnosis not present

## 2014-11-03 DIAGNOSIS — I129 Hypertensive chronic kidney disease with stage 1 through stage 4 chronic kidney disease, or unspecified chronic kidney disease: Secondary | ICD-10-CM | POA: Diagnosis not present

## 2014-11-30 ENCOUNTER — Other Ambulatory Visit: Payer: Self-pay | Admitting: Dermatology

## 2014-11-30 DIAGNOSIS — D485 Neoplasm of uncertain behavior of skin: Secondary | ICD-10-CM | POA: Diagnosis not present

## 2014-11-30 DIAGNOSIS — L570 Actinic keratosis: Secondary | ICD-10-CM | POA: Diagnosis not present

## 2014-11-30 DIAGNOSIS — L821 Other seborrheic keratosis: Secondary | ICD-10-CM | POA: Diagnosis not present

## 2015-01-05 DIAGNOSIS — Z8551 Personal history of malignant neoplasm of bladder: Secondary | ICD-10-CM | POA: Diagnosis not present

## 2015-01-17 ENCOUNTER — Ambulatory Visit: Payer: Medicare Other | Admitting: Internal Medicine

## 2015-01-29 ENCOUNTER — Other Ambulatory Visit: Payer: Self-pay | Admitting: Internal Medicine

## 2015-04-13 ENCOUNTER — Other Ambulatory Visit: Payer: Self-pay

## 2015-04-13 ENCOUNTER — Ambulatory Visit (INDEPENDENT_AMBULATORY_CARE_PROVIDER_SITE_OTHER): Payer: 59 | Admitting: Internal Medicine

## 2015-04-13 ENCOUNTER — Encounter: Payer: Self-pay | Admitting: Internal Medicine

## 2015-04-13 VITALS — BP 178/82 | HR 63 | Ht 69.0 in | Wt 201.0 lb

## 2015-04-13 DIAGNOSIS — I499 Cardiac arrhythmia, unspecified: Secondary | ICD-10-CM | POA: Diagnosis not present

## 2015-04-13 DIAGNOSIS — I498 Other specified cardiac arrhythmias: Secondary | ICD-10-CM

## 2015-04-13 MED ORDER — AMLODIPINE BESYLATE 5 MG PO TABS: 5.0000 mg | ORAL_TABLET | Freq: Every day | ORAL | Status: DC

## 2015-04-13 NOTE — Progress Notes (Signed)
Patient Care Team: Hulan Fess, MD as PCP - General (Family Medicine)   HPI  [name] is a 81 y.o. male Seen in followup for ventricular ectopy associated with functional bradycardia;  he has been treated with propafenone He is having no dizziness or metallic taste  The patient denies chest pain, shortness of breath, nocturnal dyspnea, orthopnea or peripheral edema.  There have been no palpitations, lightheadedness or syncope.    Blood pressure has been elevated. At home recordings are in the 150 range  .He has chronic kidney disease and is followed for this in [facility]   Date Propafenone QRS PR  7/16 225 116 296  4/15  100 --  10/13 225 112 240    Past Medical History  Diagnosis Date  . Hyperlipidemia   . Cataract   . Prostate cancer     status post brachytherapy---in 2003  . Ventricular tachycardia, nonsustained/bigeminy     echo 6/12: EF 55%, basal inferolat AK, mild LVH  . Chronic kidney disease   . Arthritis   . Hard of hearing   . Hypertension     LOV with EKG 06/11/11 Dr Caryl Comes  [facility]  . Bladder cancer   . CAD (coronary artery disease)     cath 02/28/11: dLM 10%, pLAD 20%, pRCA calcified nodule 50% (FFR 0.86 - not significant), mRCA 40-50%, mildly elevated filling pressures, EF 50-55%  . Sleep apnea     STOP BANG SCORE 4    Past Surgical History  Procedure Laterality Date  . Appendectomy    . Cardiac catheterization  6/12  . Skin cancer excision      x 4  . Eye surgery      left cataract extraction with IOL  . Transurethral resection of bladder tumor  05/20/2012    Procedure: TRANSURETHRAL RESECTION OF BLADDER TUMOR (TURBT);  Surgeon: Alexis Frock, MD;  Location: [facility];  Service: Urology;  Laterality: N/A;  [place]   . Cystoscopy  05/20/2012    Procedure: CYSTOSCOPY;  Surgeon: Alexis Frock, MD;  Location: [facility];  Service: Urology;  Laterality: N/A;  . Transurethral resection of bladder tumor  07/01/2012    Procedure: TRANSURETHRAL RESECTION  OF BLADDER TUMOR (TURBT);  Surgeon: Molli Hazard, MD;  Location: [facility];  Service: Urology;  Laterality: N/A;  Cystoscopy, TURBT, be prepared for Open Cystorraphy      . Cystoscopy  07/01/2012    Procedure: CYSTOSCOPY;  Surgeon: Molli Hazard, MD;  Location: [facility];  Service: Urology;  Laterality: N/A;    Current Outpatient Prescriptions  Medication Sig Dispense Refill  . acetaminophen (TYLENOL) 500 MG tablet Take 500 mg by mouth every 6 (six) hours as needed (pain).     Marland Kitchen atorvastatin (LIPITOR) 20 MG tablet Take 20 mg by mouth every evening.     . docusate sodium (COLACE) 100 MG capsule Take 100 mg by mouth daily as needed (constipation).     . hydrochlorothiazide (,MICROZIDE/HYDRODIURIL,) 12.5 MG capsule Take 12.5 mg by mouth daily with breakfast.     . Multiple Vitamin (MULTIVITAMIN WITH MINERALS) TABS Take 1 tablet by mouth daily.    . Probiotic Product (PHILLIPS COLON HEALTH PO) Take 1 capsule by mouth daily.     . propafenone (RYTHMOL) 225 MG tablet TAKE 1 TABLET BY MOUTH TWICE A DAY 180 tablet 0  . zinc gluconate 50 MG tablet Take 50 mg by mouth daily.    . ramipril (ALTACE) 5 MG capsule  Take 1 capsule by mouth daily.     No current facility-administered medications for this visit.    Allergies  Allergen Reactions  . Neosporin [Neomycin-Bacitracin Zn-Polymyx] Other (See Comments)    Makes his skin red   . Penicillins Hives and Itching    Review of Systems negative except from HPI and PMH  Physical Exam BP 178/82 mmHg  Pulse 63  Ht 5\' 9"  (1.753 m)  Wt 201 lb (91.173 kg)  BMI 29.67 kg/m2 Well developed and well nourished in no acute distress HENT normal E scleral and icterus clear Neck Supple JVP flat; carotids brisk and full Clear to ausculation  Regular rate and rhythm, no murmurs gallops or rub Soft with active bowel sounds No clubbing cyanosis  trEdema Alert and oriented, grossly normal motor and sensory function Skin Warm and Dry  ECG  SR  63 Intervals 30/12/42 Axis left -50   Assessment and  Plan  PVC  PACs/Atrial Tach  1 AVB   Hypertension     he is tolerating his throat as well. There has been an interval increase in his PR interval from 240--90. We will plan to review this again in about 6 months on his current dose of propafenone.  He also has an elevated blood pressure. We will begin him on amlodipine 5 mg daily. He will follow-up with his PCP

## 2015-04-13 NOTE — Patient Instructions (Signed)
Medication Instructions:  Your physician has recommended you make the following change in your medication:  1) START Amlodipine 5 mg daily  Labwork: None ordered  Testing/Procedures: None ordered  Follow-Up: Your physician wants you to follow-up in: 6 months with Chanetta Marshall, NP.  You will receive a reminder letter in the mail two months in advance. If you don't receive a letter, please call our office to schedule the follow-up appointment.  Your physician wants you to follow-up in: 1 year with Dr. Caryl Comes.  You will receive a reminder letter in the mail two months in advance. If you don't receive a letter, please call our office to schedule the follow-up appointment.  Any Other Special Instructions Will Be Listed Below (If Applicable). Thank you for choosing [facility]!!

## 2015-05-03 ENCOUNTER — Other Ambulatory Visit: Payer: Self-pay | Admitting: Internal Medicine

## 2015-05-04 DIAGNOSIS — N183 Chronic kidney disease, stage 3 (moderate): Secondary | ICD-10-CM | POA: Diagnosis not present

## 2015-05-04 DIAGNOSIS — I129 Hypertensive chronic kidney disease with stage 1 through stage 4 chronic kidney disease, or unspecified chronic kidney disease: Secondary | ICD-10-CM | POA: Diagnosis not present

## 2015-05-04 DIAGNOSIS — I959 Hypotension, unspecified: Secondary | ICD-10-CM | POA: Diagnosis not present

## 2015-05-18 DIAGNOSIS — H43393 Other vitreous opacities, bilateral: Secondary | ICD-10-CM | POA: Diagnosis not present

## 2015-05-18 DIAGNOSIS — H2513 Age-related nuclear cataract, bilateral: Secondary | ICD-10-CM | POA: Diagnosis not present

## 2015-06-03 ENCOUNTER — Other Ambulatory Visit: Payer: Self-pay | Admitting: Internal Medicine

## 2015-06-07 DIAGNOSIS — D225 Melanocytic nevi of trunk: Secondary | ICD-10-CM | POA: Diagnosis not present

## 2015-06-07 DIAGNOSIS — L821 Other seborrheic keratosis: Secondary | ICD-10-CM | POA: Diagnosis not present

## 2015-06-07 DIAGNOSIS — Z85828 Personal history of other malignant neoplasm of skin: Secondary | ICD-10-CM | POA: Diagnosis not present

## 2015-06-07 DIAGNOSIS — Z23 Encounter for immunization: Secondary | ICD-10-CM | POA: Diagnosis not present

## 2015-06-07 DIAGNOSIS — L570 Actinic keratosis: Secondary | ICD-10-CM | POA: Diagnosis not present

## 2015-06-07 DIAGNOSIS — D485 Neoplasm of uncertain behavior of skin: Secondary | ICD-10-CM | POA: Diagnosis not present

## 2015-06-23 DIAGNOSIS — H43811 Vitreous degeneration, right eye: Secondary | ICD-10-CM | POA: Diagnosis not present

## 2015-06-23 DIAGNOSIS — Z961 Presence of intraocular lens: Secondary | ICD-10-CM | POA: Diagnosis not present

## 2015-06-23 DIAGNOSIS — H2511 Age-related nuclear cataract, right eye: Secondary | ICD-10-CM | POA: Diagnosis not present

## 2015-06-23 DIAGNOSIS — H02839 Dermatochalasis of unspecified eye, unspecified eyelid: Secondary | ICD-10-CM | POA: Diagnosis not present

## 2015-07-13 DIAGNOSIS — D414 Neoplasm of uncertain behavior of bladder: Secondary | ICD-10-CM | POA: Diagnosis not present

## 2015-08-03 DIAGNOSIS — H2511 Age-related nuclear cataract, right eye: Secondary | ICD-10-CM | POA: Diagnosis not present

## 2015-08-11 DIAGNOSIS — K047 Periapical abscess without sinus: Secondary | ICD-10-CM | POA: Diagnosis not present

## 2015-10-26 ENCOUNTER — Other Ambulatory Visit: Payer: Self-pay | Admitting: Internal Medicine

## 2015-11-28 DIAGNOSIS — N183 Chronic kidney disease, stage 3 unspecified: Secondary | ICD-10-CM

## 2015-11-28 DIAGNOSIS — I129 Hypertensive chronic kidney disease with stage 1 through stage 4 chronic kidney disease, or unspecified chronic kidney disease: Secondary | ICD-10-CM | POA: Diagnosis not present

## 2015-11-28 DIAGNOSIS — I472 Ventricular tachycardia: Secondary | ICD-10-CM

## 2015-11-28 DIAGNOSIS — I4729 Other ventricular tachycardia: Secondary | ICD-10-CM

## 2015-11-28 DIAGNOSIS — E785 Hyperlipidemia, unspecified: Secondary | ICD-10-CM

## 2015-11-28 HISTORY — DX: Hyperlipidemia, unspecified: E78.5

## 2015-11-28 HISTORY — DX: Other ventricular tachycardia: I47.29

## 2015-11-28 HISTORY — DX: Ventricular tachycardia: I47.2

## 2015-11-28 HISTORY — DX: Hypertensive chronic kidney disease with stage 1 through stage 4 chronic kidney disease, or unspecified chronic kidney disease: I12.9

## 2015-11-28 HISTORY — DX: Chronic kidney disease, stage 3 unspecified: N18.30

## 2015-11-30 ENCOUNTER — Encounter: Payer: Self-pay | Admitting: Nurse Practitioner

## 2015-11-30 ENCOUNTER — Ambulatory Visit (INDEPENDENT_AMBULATORY_CARE_PROVIDER_SITE_OTHER): Payer: 59 | Admitting: Nurse Practitioner

## 2015-11-30 VITALS — BP 138/89 | HR 66 | Ht 69.0 in | Wt 196.8 lb

## 2015-11-30 DIAGNOSIS — I10 Essential (primary) hypertension: Secondary | ICD-10-CM

## 2015-11-30 DIAGNOSIS — I493 Ventricular premature depolarization: Secondary | ICD-10-CM

## 2015-11-30 NOTE — Progress Notes (Signed)
Electrophysiology Office Note Date: 11/30/2015  ID:  [name], DOB [birth year], MRN [id number]  PCP: Gennette Pac, MD Electrophysiologist: Caryl Comes  CC: PVC followup  [name] is a 82 y.o. male seen today for Dr Caryl Comes.  He presents today for routine electrophysiology followup.  Since last being seen in our clinic, the patient reports doing very well.  He denies chest pain, palpitations, dyspnea, PND, orthopnea, nausea, vomiting, dizziness, syncope, edema, weight gain, or early satiety.  Past Medical History  Diagnosis Date  . Hyperlipidemia   . Cataract   . Prostate cancer ([place])     status post brachytherapy---in 2003  . Ventricular tachycardia, nonsustained/bigeminy     echo 6/12: EF 55%, basal inferolat AK, mild LVH  . Chronic kidney disease   . Arthritis   . Hard of hearing   . Hypertension     LOV with EKG 06/11/11 Dr Caryl Comes  [facility]  . Bladder cancer ([place])   . CAD (coronary artery disease)     cath 02/28/11: dLM 10%, pLAD 20%, pRCA calcified nodule 50% (FFR 0.86 - not significant), mRCA 40-50%, mildly elevated filling pressures, EF 50-55%  . Sleep apnea     STOP BANG SCORE 4   Past Surgical History  Procedure Laterality Date  . Appendectomy    . Cardiac catheterization  6/12  . Skin cancer excision      x 4  . Eye surgery      left cataract extraction with IOL  . Transurethral resection of bladder tumor  05/20/2012    Procedure: TRANSURETHRAL RESECTION OF BLADDER TUMOR (TURBT);  Surgeon: Alexis Frock, MD;  Location: [facility];  Service: Urology;  Laterality: N/A;  [place]   . Cystoscopy  05/20/2012    Procedure: CYSTOSCOPY;  Surgeon: Alexis Frock, MD;  Location: [facility];  Service: Urology;  Laterality: N/A;  . Transurethral resection of bladder tumor  07/01/2012    Procedure: TRANSURETHRAL RESECTION OF BLADDER TUMOR (TURBT);  Surgeon: Molli Hazard, MD;  Location: [facility];  Service: Urology;  Laterality: N/A;  Cystoscopy, TURBT, be prepared for Open  Cystorraphy      . Cystoscopy  07/01/2012    Procedure: CYSTOSCOPY;  Surgeon: Molli Hazard, MD;  Location: [facility];  Service: Urology;  Laterality: N/A;    Current Outpatient Prescriptions  Medication Sig Dispense Refill  . acetaminophen (TYLENOL) 500 MG tablet Take 500 mg by mouth every 6 (six) hours as needed (pain).     Marland Kitchen amLODipine (NORVASC) 5 MG tablet TAKE 1 TABLET (5 MG TOTAL) BY MOUTH DAILY. 30 tablet 5  . aspirin EC 81 MG tablet Take 81 mg by mouth.    Marland Kitchen atorvastatin (LIPITOR) 20 MG tablet Take 20 mg by mouth every evening.     . COMBIGAN 0.2-0.5 % ophthalmic solution USE 1 DROP IN RIGHT EYE TWICE A DAY  6  . [employer] SODIUM CHLORIDE 5 % ophthalmic solution PLACE 1 DROP INTO RIGHT EYE 4 TIMES A DAY  3  . docusate sodium (COLACE) 100 MG capsule Take 100 mg by mouth daily as needed (constipation).     . DUREZOL 0.05 % EMUL 1 DROP IN RIGHT EYE THREE TIMES A DAY  1  . hydrochlorothiazide (,MICROZIDE/HYDRODIURIL,) 12.5 MG capsule Take 12.5 mg by mouth daily with breakfast.     . Multiple Vitamin (MULTIVITAMIN WITH MINERALS) TABS Take 1 tablet by mouth daily.    . Omega-3 1000 MG CAPS Take 1 g by mouth.    Marland Kitchen  polyethylene glycol (MIRALAX / GLYCOLAX) packet Take 17 g by mouth.    . Probiotic Product (PHILLIPS COLON HEALTH PO) Take 1 capsule by mouth daily.     . propafenone (RYTHMOL) 225 MG tablet TAKE 1 TABLET BY MOUTH TWICE A DAY 180 tablet 0  . ramipril (ALTACE) 5 MG capsule Take 1 capsule by mouth daily.    Marland Kitchen zinc gluconate 50 MG tablet Take 50 mg by mouth daily.     No current facility-administered medications for this visit.    Allergies:   Neosporin; Penicillins; and Penicillin g   Social History: Social History   Social History  . Marital Status: Married    Spouse Name: N/A  . Number of Children: N/A  . Years of Education: N/A   Occupational History  . Not on file.   Social History Main Topics  . Smoking status: Former Smoker    Types: Cigars    Quit date:  05/20/1995  . Smokeless tobacco: Current User    Types: Chew  . Alcohol Use: Yes     Comment: 6 beer week  . Drug Use: No  . Sexual Activity: Not on file   Other Topics Concern  . Not on file   Social History Narrative   He lives at home with his wife. He is remarried. He lost his daughter at age of 63 to lung cancer. He does not smoke. Uses alcohol occasionally. Denies use of recreational drugs    Family History: Family History  Problem Relation Age of Onset  . Arthritis Son   . Heart Problems Son     Aortic valve replacement  . Lung cancer Daughter   . Heart attack Neg Hx   . Stroke Neg Hx   . Hypertension Neg Hx     Review of Systems: All other systems reviewed and are otherwise negative except as noted above.   Physical Exam: VS:  BP 138/89 mmHg  Pulse 66  Ht 5\' 9"  (1.753 m)  Wt 196 lb 12.8 oz (89.268 kg)  BMI 29.05 kg/m2 , BMI Body mass index is 29.05 kg/(m^2). Wt Readings from Last 3 Encounters:  11/30/15 196 lb 12.8 oz (89.268 kg)  04/13/15 201 lb (91.173 kg)  01/11/14 200 lb (90.719 kg)    GEN- The patient is elderly appearing, alert and oriented x 3 today.   HEENT: normocephalic, atraumatic; sclera clear, conjunctiva pink; hearing intact; oropharynx clear; neck supple Lungs- Clear to ausculation bilaterally, normal work of breathing.  No wheezes, rales, rhonchi Heart- Regular rate and rhythm GI- soft, non-tender, non-distended, bowel sounds present Extremities- no clubbing, cyanosis, or edema; DP/PT/radial pulses 2+ bilaterally MS- no significant deformity or atrophy Skin- warm and dry, no rash or lesion  Psych- euthymic mood, full affect Neuro- strength and sensation are intact   EKG:  EKG is ordered today. The ekg ordered today shows sinus rhythm, 1st degree AV block, PR 215msec, rate 66  Recent Labs: No results found for requested labs within last 365 days.    Other studies Reviewed: Additional studies/ records that were reviewed today  include: Dr Olin Pia office notes  Assessment and Plan: 1.  PVC's Controlled on Propafenone EKG today stable No changes  2.  HTN Stable No change required today   Current medicines are reviewed at length with the patient today.   The patient does not have concerns regarding his medicines.  The following changes were made today:  none  Labs/ tests ordered today include: none  Disposition:   Follow up with Dr Caryl Comes in 6 months     Signed, Chanetta Marshall, NP 11/30/2015 2:18 PM   [place] [address] [phone] (office) [phone] (fax)

## 2015-11-30 NOTE — Patient Instructions (Signed)
Medication Instructions:   Your physician recommends that you continue on your current medications as directed. Please refer to the Current Medication list given to you today.   If you need a refill on your cardiac medications before your next appointment, please call your pharmacy.  Labwork: NONE ORDER TODAY    Testing/Procedures:  NONE ORDER TODAY    Follow-Up:  Your physician wants you to follow-up in:  IN  [place].Marland Kitchen You will receive a reminder letter in the mail two months in advance. If you don't receive a letter, please call our office to schedule the follow-up appointment.      Any Other Special Instructions Will Be Listed Below (If Applicable).

## 2015-12-21 DIAGNOSIS — Z8546 Personal history of malignant neoplasm of prostate: Secondary | ICD-10-CM | POA: Diagnosis not present

## 2015-12-21 DIAGNOSIS — Z8551 Personal history of malignant neoplasm of bladder: Secondary | ICD-10-CM | POA: Diagnosis not present

## 2016-01-22 ENCOUNTER — Other Ambulatory Visit: Payer: Self-pay | Admitting: Internal Medicine

## 2016-01-31 DIAGNOSIS — D485 Neoplasm of uncertain behavior of skin: Secondary | ICD-10-CM | POA: Diagnosis not present

## 2016-01-31 DIAGNOSIS — L570 Actinic keratosis: Secondary | ICD-10-CM | POA: Diagnosis not present

## 2016-02-10 DIAGNOSIS — S0993XA Unspecified injury of face, initial encounter: Secondary | ICD-10-CM | POA: Diagnosis not present

## 2016-02-10 DIAGNOSIS — S0990XA Unspecified injury of head, initial encounter: Secondary | ICD-10-CM | POA: Diagnosis not present

## 2016-02-11 ENCOUNTER — Emergency Department (HOSPITAL_COMMUNITY): Payer: 59

## 2016-02-11 ENCOUNTER — Encounter (HOSPITAL_COMMUNITY): Payer: Self-pay | Admitting: *Deleted

## 2016-02-11 ENCOUNTER — Emergency Department (HOSPITAL_COMMUNITY)
Admission: EM | Admit: 2016-02-11 | Discharge: 2016-02-11 | Disposition: A | Discharge status: Home / Self Care | Payer: 59 | Attending: Emergency Medicine | Admitting: Emergency Medicine

## 2016-02-11 DIAGNOSIS — I2510 Atherosclerotic heart disease of native coronary artery without angina pectoris: Secondary | ICD-10-CM | POA: Insufficient documentation

## 2016-02-11 DIAGNOSIS — R51 Headache: Secondary | ICD-10-CM | POA: Diagnosis not present

## 2016-02-11 DIAGNOSIS — Y939 Activity, unspecified: Secondary | ICD-10-CM | POA: Diagnosis not present

## 2016-02-11 DIAGNOSIS — Z87891 Personal history of nicotine dependence: Secondary | ICD-10-CM | POA: Diagnosis not present

## 2016-02-11 DIAGNOSIS — Y92480 Sidewalk as the place of occurrence of the external cause: Secondary | ICD-10-CM | POA: Insufficient documentation

## 2016-02-11 DIAGNOSIS — N189 Chronic kidney disease, unspecified: Secondary | ICD-10-CM | POA: Diagnosis not present

## 2016-02-11 DIAGNOSIS — Y999 Unspecified external cause status: Secondary | ICD-10-CM | POA: Insufficient documentation

## 2016-02-11 DIAGNOSIS — I129 Hypertensive chronic kidney disease with stage 1 through stage 4 chronic kidney disease, or unspecified chronic kidney disease: Secondary | ICD-10-CM | POA: Insufficient documentation

## 2016-02-11 DIAGNOSIS — Z8546 Personal history of malignant neoplasm of prostate: Secondary | ICD-10-CM | POA: Insufficient documentation

## 2016-02-11 DIAGNOSIS — Z7982 Long term (current) use of aspirin: Secondary | ICD-10-CM | POA: Diagnosis not present

## 2016-02-11 DIAGNOSIS — S0011XA Contusion of right eyelid and periocular area, initial encounter: Secondary | ICD-10-CM | POA: Insufficient documentation

## 2016-02-11 DIAGNOSIS — S0993XA Unspecified injury of face, initial encounter: Secondary | ICD-10-CM | POA: Diagnosis present

## 2016-02-11 DIAGNOSIS — S0012XA Contusion of left eyelid and periocular area, initial encounter: Secondary | ICD-10-CM | POA: Insufficient documentation

## 2016-02-11 DIAGNOSIS — W01198A Fall on same level from slipping, tripping and stumbling with subsequent striking against other object, initial encounter: Secondary | ICD-10-CM | POA: Insufficient documentation

## 2016-02-11 DIAGNOSIS — H1089 Other conjunctivitis: Secondary | ICD-10-CM

## 2016-02-11 DIAGNOSIS — Z23 Encounter for immunization: Secondary | ICD-10-CM | POA: Diagnosis not present

## 2016-02-11 DIAGNOSIS — Z79899 Other long term (current) drug therapy: Secondary | ICD-10-CM | POA: Diagnosis not present

## 2016-02-11 DIAGNOSIS — R9389 Abnormal findings on diagnostic imaging of other specified body structures: Secondary | ICD-10-CM

## 2016-02-11 DIAGNOSIS — S80212A Abrasion, left knee, initial encounter: Secondary | ICD-10-CM | POA: Insufficient documentation

## 2016-02-11 MED ORDER — TETANUS-DIPHTH-ACELL PERTUSSIS 5-2.5-18.5 LF-MCG/0.5 IM SUSP
0.5000 mL | Freq: Once | INTRAMUSCULAR | Status: AC
  Administered 2016-02-11: 0.5 mL via INTRAMUSCULAR
  Filled 2016-02-11: qty 0.5

## 2016-02-11 MED ORDER — MOXIFLOXACIN HCL 0.5 % OP SOLN: 1.0000 [drp] | Freq: Three times a day (TID) | OPHTHALMIC | Status: DC

## 2016-02-11 NOTE — ED Provider Notes (Signed)
CSN: [id number]     Arrival date & time 02/11/16  0030 History  By signing my name below, I, Maud Deed. Royston Sinner, attest that this documentation has been prepared under the direction and in the presence of Viviana Trimble, MD.  Electronically Signed: Maud Deed. Royston Sinner, ED Scribe. 02/11/2016. 2:10 AM.   Chief Complaint  Patient presents with  . Fall  . Facial Injury    Patient is a 82 y.o. male presenting with fall. The history is provided by the patient. No language interpreter was used.  Fall This is a new problem. The current episode started 2 days ago. The problem has not changed since onset.Pertinent negatives include no chest pain, no abdominal pain and no shortness of breath. Nothing aggravates the symptoms. The symptoms are relieved by ice.    HPI Comments: [name] is a 82 y.o. male with PMHx of HLD, CAD, and CKD who presents to the Emergency Department here after an unwitnessed fall sustained 2 days ago. Pt reports he fell on cement, hitting his face. He is complaining of sudden onset, constant, facial pain x 2 days. Patient notes associated facial swelling, and bruising of eyes. He reports applying ice to left side of face with mild relief. No additional alleviating or aggravating factors at this time. Pt has unknown tetanus status. Pt denies fever, chills, diarrhea, nausea, vomiting.  Past Medical History  Diagnosis Date  . Hyperlipidemia   . Cataract   . Prostate cancer ([place])     status post brachytherapy---in 2003  . Ventricular tachycardia, nonsustained/bigeminy     echo 6/12: EF 55%, basal inferolat AK, mild LVH  . Chronic kidney disease   . Arthritis   . Hard of hearing   . Hypertension     LOV with EKG 06/11/11 Dr Caryl Comes  [facility]  . Bladder cancer ([place])   . CAD (coronary artery disease)     cath 02/28/11: dLM 10%, pLAD 20%, pRCA calcified nodule 50% (FFR 0.86 - not significant), mRCA 40-50%, mildly elevated filling pressures, EF 50-55%  . Sleep apnea     STOP BANG SCORE 4    Past Surgical History  Procedure Laterality Date  . Appendectomy    . Cardiac catheterization  6/12  . Skin cancer excision      x 4  . Eye surgery      left cataract extraction with IOL  . Transurethral resection of bladder tumor  05/20/2012    Procedure: TRANSURETHRAL RESECTION OF BLADDER TUMOR (TURBT);  Surgeon: Alexis Frock, MD;  Location: [facility];  Service: Urology;  Laterality: N/A;  [place]   . Cystoscopy  05/20/2012    Procedure: CYSTOSCOPY;  Surgeon: Alexis Frock, MD;  Location: [facility];  Service: Urology;  Laterality: N/A;  . Transurethral resection of bladder tumor  07/01/2012    Procedure: TRANSURETHRAL RESECTION OF BLADDER TUMOR (TURBT);  Surgeon: Molli Hazard, MD;  Location: [facility];  Service: Urology;  Laterality: N/A;  Cystoscopy, TURBT, be prepared for Open Cystorraphy      . Cystoscopy  07/01/2012    Procedure: CYSTOSCOPY;  Surgeon: Molli Hazard, MD;  Location: [facility];  Service: Urology;  Laterality: N/A;   Family History  Problem Relation Age of Onset  . Arthritis Son   . Heart Problems Son     Aortic valve replacement  . Lung cancer Daughter   . Heart attack Neg Hx   . Stroke Neg Hx   . Hypertension Neg Hx    Social History  Substance Use Topics  . Smoking status: Former Smoker    Types: Cigars    Quit date: 05/20/1995  . Smokeless tobacco: Current User    Types: Chew  . Alcohol Use: Yes     Comment: 6 beer week    Review of Systems  Constitutional: Negative for fever and chills.  HENT: Positive for facial swelling. Negative for congestion.   Respiratory: Negative for cough and shortness of breath.   Cardiovascular: Negative for chest pain.  Gastrointestinal: Negative for nausea, vomiting, abdominal pain and diarrhea.  Genitourinary: Negative for dysuria.  Psychiatric/Behavioral: Negative for confusion.  All other systems reviewed and are negative.     Allergies  Neosporin; Penicillins; and Penicillin g  Home Medications    Prior to Admission medications   Medication Sig Start Date End Date Taking? Authorizing Provider  acetaminophen (TYLENOL) 500 MG tablet Take 500 mg by mouth every 6 (six) hours as needed (pain).    Yes Historical Provider, MD  aspirin EC 81 MG tablet Take 81 mg by mouth.   Yes Historical Provider, MD  atorvastatin (LIPITOR) 20 MG tablet Take 20 mg by mouth every evening.    Yes Historical Provider, MD  DUREZOL 0.05 % EMUL 1 DROP IN RIGHT EYE THREE TIMES A DAY 10/06/15  Yes Historical Provider, MD  hydrochlorothiazide (,MICROZIDE/HYDRODIURIL,) 12.5 MG capsule Take 12.5 mg by mouth daily with breakfast.    Yes Historical Provider, MD  Multiple Vitamin (MULTIVITAMIN WITH MINERALS) TABS Take 1 tablet by mouth daily.   Yes Historical Provider, MD  Omega-3 1000 MG CAPS Take 1,000 mg by mouth 2 (two) times daily.    Yes Historical Provider, MD  Polyethyl Glycol-Propyl Glycol (SYSTANE OP) Apply 2 drops to eye 4 (four) times daily.   Yes Historical Provider, MD  polyethylene glycol (MIRALAX / GLYCOLAX) packet Take 17 g by mouth.   Yes Historical Provider, MD  Probiotic Product ([place]) Take 1 capsule by mouth daily.    Yes Historical Provider, MD  propafenone (RYTHMOL) 225 MG tablet TAKE 1 TABLET BY MOUTH TWICE A DAY 01/23/16  Yes Deboraha Sprang, MD  ramipril (ALTACE) 5 MG capsule Take 1 capsule by mouth daily. 03/21/15  Yes Historical Provider, MD  zinc gluconate 50 MG tablet Take 50 mg by mouth daily.   Yes Historical Provider, MD  amLODipine (NORVASC) 5 MG tablet TAKE 1 TABLET (5 MG TOTAL) BY MOUTH DAILY. 06/05/15   Deboraha Sprang, MD   BP 140/100 mmHg  Pulse 78  Temp(Src) 98.1 F (36.7 C) (Oral)  Resp 20  SpO2 99%   Physical Exam  Constitutional: He is oriented to person, place, and time. He appears well-developed and well-nourished.  HENT:  Head: Normocephalic.  Right Ear: No hemotympanum.  Left Ear: No hemotympanum.  Eyes: EOM are normal.  Drainage from medial campus of  Right eye. Bruising on eyelids bilaterally. Worse on left.   Neck: Normal range of motion.  Cardiovascular: Normal rate, regular rhythm, normal heart sounds and intact distal pulses.   Pulmonary/Chest: Effort normal and breath sounds normal. No respiratory distress.  Abdominal: Soft. Bowel sounds are normal. He exhibits no distension. There is no tenderness.  Musculoskeletal: Normal range of motion.  No crepitus, stepoffs of C, T, L-spine  Neurological: He is alert and oriented to person, place, and time.  Skin: Skin is warm and dry.  Abrasions on left knuckles. Abrasion on left knee.  Psychiatric: He has a normal mood and affect. Judgment normal.  Nursing note and vitals reviewed.   ED Course  Procedures (including critical care time) DIAGNOSTIC STUDIES: Oxygen Saturation is 99% on RA, normal by my interpretation.    COORDINATION OF CARE: 2:08 AM-Discussed treatment plan which includes CT of fact and head with pt at bedside and pt agreed to plan.   Labs Review Labs Reviewed - No data to display  Imaging Review No results found. I have personally reviewed and evaluated these images and lab results as part of my medical decision-making.   EKG Interpretation None      MDM   Final diagnoses:  None   Filed Vitals:   02/11/16 0043  BP: 140/100  Pulse: 78  Temp: 98.1 F (36.7 C)  Resp: 20    Results for orders placed or performed during the hospital encounter of 06/29/12  Surgical pcr screen  Result Value Ref Range   MRSA, PCR NEGATIVE NEGATIVE   Staphylococcus aureus NEGATIVE NEGATIVE  CBC  Result Value Ref Range   WBC 9.5 4.0 - 10.5 K/uL   RBC 4.43 4.22 - 5.81 MIL/uL   Hemoglobin 15.3 13.0 - 17.0 g/dL   HCT 44.0 39.0 - 52.0 %   MCV 99.3 78.0 - 100.0 fL   MCH 34.5 (H) 26.0 - 34.0 pg   MCHC 34.8 30.0 - 36.0 g/dL   RDW 13.2 11.5 - 15.5 %   Platelets 217 150 - 400 K/uL  Basic metabolic panel  Result Value Ref Range   Sodium 135 135 - 145 mEq/L   Potassium  4.8 3.5 - 5.1 mEq/L   Chloride 97 96 - 112 mEq/L   CO2 30 19 - 32 mEq/L   Glucose, Bld 118 (H) 70 - 99 mg/dL   BUN 15 6 - 23 mg/dL   Creatinine, Ser 1.04 0.50 - 1.35 mg/dL   Calcium 9.8 8.4 - 10.5 mg/dL   GFR calc non Af Amer 64 (L) >90 mL/min   GFR calc Af Amer 75 (L) >90 mL/min   No results found.  Medications - No data to display  . Results for orders placed or performed during the hospital encounter of 06/29/12  Surgical pcr screen  Result Value Ref Range   MRSA, PCR NEGATIVE NEGATIVE   Staphylococcus aureus NEGATIVE NEGATIVE  CBC  Result Value Ref Range   WBC 9.5 4.0 - 10.5 K/uL   RBC 4.43 4.22 - 5.81 MIL/uL   Hemoglobin 15.3 13.0 - 17.0 g/dL   HCT 44.0 39.0 - 52.0 %   MCV 99.3 78.0 - 100.0 fL   MCH 34.5 (H) 26.0 - 34.0 pg   MCHC 34.8 30.0 - 36.0 g/dL   RDW 13.2 11.5 - 15.5 %   Platelets 217 150 - 400 K/uL  Basic metabolic panel  Result Value Ref Range   Sodium 135 135 - 145 mEq/L   Potassium 4.8 3.5 - 5.1 mEq/L   Chloride 97 96 - 112 mEq/L   CO2 30 19 - 32 mEq/L   Glucose, Bld 118 (H) 70 - 99 mg/dL   BUN 15 6 - 23 mg/dL   Creatinine, Ser 1.04 0.50 - 1.35 mg/dL   Calcium 9.8 8.4 - 10.5 mg/dL   GFR calc non Af Amer 64 (L) >90 mL/min   GFR calc Af Amer 75 (L) >90 mL/min   Ct Head Wo Contrast  02/11/2016  CLINICAL DATA:  Unwitnessed fall 2 days prior. Fall on cement striking face. Head and facial pain. EXAM: CT HEAD WITHOUT CONTRAST CT MAXILLOFACIAL WITHOUT CONTRAST  CT CERVICAL SPINE WITHOUT CONTRAST TECHNIQUE: Multidetector CT imaging of the head, cervical spine, and maxillofacial structures were performed using the standard protocol without intravenous contrast. Multiplanar CT image reconstructions of the cervical spine and maxillofacial structures were also generated. COMPARISON:  None. FINDINGS: CT HEAD FINDINGS Left frontal scalp hematoma. No intracranial hemorrhage, mass effect, or midline shift. No hydrocephalus. The basilar cisterns are patent. Generalized  atrophy and chronic small vessel ischemia. No evidence of territorial infarct. No intracranial fluid collection. Calvarium is intact. The mastoid air cells are well aerated. CT MAXILLOFACIAL FINDINGS Left periorbital hematoma. Diffuse bilateral facial edema. No orbital or facial bone fracture. Nasal bone, zygomatic arches, and mandibles are intact. There is mucosal thickening throughout the maxillary sinuses, ethmoid air cells, and opacification of the right frontal sinus. Streak artifact from dental fillings partial obscures evaluation of the teeth. There is periodontal disease. CT CERVICAL SPINE FINDINGS Straightening of normal lordosis. No evidence of acute fracture or subluxation. Minimal anterolisthesis of C4 on C5 appears degenerative. Diffuse disc space narrowing and endplate spurring, most prominent at C5-C6. Multilevel facet arthropathy. The dens is intact with pannus formation. Lateral masses of C1 appear well aligned on C2. There is multilevel neural foraminal narrowing. Heterogeneous appearance of the bones may be secondary to diffuse degenerative change. No prevertebral soft tissue edema. Atherosclerosis noted of the carotid bifurcations. IMPRESSION: 1. Facial edema and left periorbital hematoma, no facial bone fracture. 2. No acute intracranial abnormality.  No calvarial fracture. 3. Advanced multilevel degenerative change throughout cervical spine. Heterogeneous appearance of the bones, favored to be secondary to degenerative change. Infiltrative metastatic disease is not entirely excluded in this patient with history of prostate cancer. Recommend correlation with PSA. 4. Paranasal sinus disease. Electronically Signed   By: Jeb Levering M.D.   On: 02/11/2016 03:05   Ct Cervical Spine Wo Contrast  02/11/2016  CLINICAL DATA:  Unwitnessed fall 2 days prior. Fall on cement striking face. Head and facial pain. EXAM: CT HEAD WITHOUT CONTRAST CT MAXILLOFACIAL WITHOUT CONTRAST CT CERVICAL SPINE WITHOUT  CONTRAST TECHNIQUE: Multidetector CT imaging of the head, cervical spine, and maxillofacial structures were performed using the standard protocol without intravenous contrast. Multiplanar CT image reconstructions of the cervical spine and maxillofacial structures were also generated. COMPARISON:  None. FINDINGS: CT HEAD FINDINGS Left frontal scalp hematoma. No intracranial hemorrhage, mass effect, or midline shift. No hydrocephalus. The basilar cisterns are patent. Generalized atrophy and chronic small vessel ischemia. No evidence of territorial infarct. No intracranial fluid collection. Calvarium is intact. The mastoid air cells are well aerated. CT MAXILLOFACIAL FINDINGS Left periorbital hematoma. Diffuse bilateral facial edema. No orbital or facial bone fracture. Nasal bone, zygomatic arches, and mandibles are intact. There is mucosal thickening throughout the maxillary sinuses, ethmoid air cells, and opacification of the right frontal sinus. Streak artifact from dental fillings partial obscures evaluation of the teeth. There is periodontal disease. CT CERVICAL SPINE FINDINGS Straightening of normal lordosis. No evidence of acute fracture or subluxation. Minimal anterolisthesis of C4 on C5 appears degenerative. Diffuse disc space narrowing and endplate spurring, most prominent at C5-C6. Multilevel facet arthropathy. The dens is intact with pannus formation. Lateral masses of C1 appear well aligned on C2. There is multilevel neural foraminal narrowing. Heterogeneous appearance of the bones may be secondary to diffuse degenerative change. No prevertebral soft tissue edema. Atherosclerosis noted of the carotid bifurcations. IMPRESSION: 1. Facial edema and left periorbital hematoma, no facial bone fracture. 2. No acute intracranial abnormality.  No calvarial fracture. 3.  Advanced multilevel degenerative change throughout cervical spine. Heterogeneous appearance of the bones, favored to be secondary to degenerative  change. Infiltrative metastatic disease is not entirely excluded in this patient with history of prostate cancer. Recommend correlation with PSA. 4. Paranasal sinus disease. Electronically Signed   By: Jeb Levering M.D.   On: 02/11/2016 03:05   Ct Maxillofacial Wo Cm  02/11/2016  CLINICAL DATA:  Unwitnessed fall 2 days prior. Fall on cement striking face. Head and facial pain. EXAM: CT HEAD WITHOUT CONTRAST CT MAXILLOFACIAL WITHOUT CONTRAST CT CERVICAL SPINE WITHOUT CONTRAST TECHNIQUE: Multidetector CT imaging of the head, cervical spine, and maxillofacial structures were performed using the standard protocol without intravenous contrast. Multiplanar CT image reconstructions of the cervical spine and maxillofacial structures were also generated. COMPARISON:  None. FINDINGS: CT HEAD FINDINGS Left frontal scalp hematoma. No intracranial hemorrhage, mass effect, or midline shift. No hydrocephalus. The basilar cisterns are patent. Generalized atrophy and chronic small vessel ischemia. No evidence of territorial infarct. No intracranial fluid collection. Calvarium is intact. The mastoid air cells are well aerated. CT MAXILLOFACIAL FINDINGS Left periorbital hematoma. Diffuse bilateral facial edema. No orbital or facial bone fracture. Nasal bone, zygomatic arches, and mandibles are intact. There is mucosal thickening throughout the maxillary sinuses, ethmoid air cells, and opacification of the right frontal sinus. Streak artifact from dental fillings partial obscures evaluation of the teeth. There is periodontal disease. CT CERVICAL SPINE FINDINGS Straightening of normal lordosis. No evidence of acute fracture or subluxation. Minimal anterolisthesis of C4 on C5 appears degenerative. Diffuse disc space narrowing and endplate spurring, most prominent at C5-C6. Multilevel facet arthropathy. The dens is intact with pannus formation. Lateral masses of C1 appear well aligned on C2. There is multilevel neural foraminal  narrowing. Heterogeneous appearance of the bones may be secondary to diffuse degenerative change. No prevertebral soft tissue edema. Atherosclerosis noted of the carotid bifurcations. IMPRESSION: 1. Facial edema and left periorbital hematoma, no facial bone fracture. 2. No acute intracranial abnormality.  No calvarial fracture. 3. Advanced multilevel degenerative change throughout cervical spine. Heterogeneous appearance of the bones, favored to be secondary to degenerative change. Infiltrative metastatic disease is not entirely excluded in this patient with history of prostate cancer. Recommend correlation with PSA. 4. Paranasal sinus disease. Electronically Signed   By: Jeb Levering M.D.   On: 02/11/2016 03:05    Medications  Tdap (BOOSTRIX) injection 0.5 mL (0.5 mLs Intramuscular Given 02/11/16 0253)   Follow up with Optho within 2 days . Will start eye drops given Vigamox. Follow up with PMD regarding incidental findings of abnormal bone seen on CT scan will need to assess whether this is related to your history of prostate CA.  Strict return precautions given  I personally performed the services described in this documentation, which was scribed in my presence. The recorded information has been reviewed and is accurate.      Veatrice Kells, MD 02/11/16 [id number]

## 2016-02-11 NOTE — Discharge Instructions (Signed)
How to Use Eye Drops and Eye Ointments  HOW TO APPLY EYE DROPS  Follow these steps when applying eye drops:  1. Wash your hands.  2. Tilt your head back.  3. Put a finger under your eye and use it to gently pull your lower lid downward. Keep that finger in place.  4. Using your other hand, hold the dropper between your thumb and index finger.  5. Position the dropper just over the edge of the lower lid. Hold it as close to your eye as you can without touching the dropper to your eye.  6. Steady your hand. One way to do this is to lean your index finger against your brow.  7. Look up.  8. Slowly and gently squeeze one drop of medicine into your eye.  9. Close your eye.  10. Place a finger between your lower eyelid and your nose. Press gently for 2 minutes. This increases the amount of time that the medicine is exposed to the eye. It also reduces side effects that can develop if the drop gets into the bloodstream through the nose.  HOW TO APPLY EYE OINTMENTS  Follow these steps when applying eye ointments:  1. Wash your hands.  2. Put a finger under your eye and use it to gently pull your lower lid downward. Keep that finger in place.  3. Using your other hand, place the tip of the tube between your thumb and index finger with the remaining fingers braced against your cheek or nose.  4. Hold the tube just over the edge of your lower lid without touching the tube to your lid or eyeball.  5. Look up.  6. Line the inner part of your lower lid with ointment.  7. Gently pull up on your upper lid and look down. This will force the ointment to spread over the surface of the eye.  8. Release the upper lid.  9. If you can, close your eyes for 1-2 minutes.  Do not rub your eyes. If you applied the ointment correctly, your vision will be blurry for a few minutes. This is normal.  ADDITIONAL INFORMATION   Make sure to use the eye drops or ointment as told by your health care provider.   If you have been told to use both eye  drops and an eye ointment, apply the eye drops first, then wait 3-4 minutes before you apply the ointment.   Try not to touch the tip of the dropper or tube to your eye. A dropper or tube that has touched the eye can become contaminated.     This information is not intended to replace advice given to you by your health care provider. Make sure you discuss any questions you have with your health care provider.     Document Released: 12/09/2000 Document Revised: 01/17/2015 Document Reviewed: 08/29/2014  Elsevier Interactive Patient Education 2016 [employer]

## 2016-02-11 NOTE — ED Notes (Signed)
Pt arrives to the ER via EMS after a fall on Thurs; pt tripped and fell on the pavement striking his face; pt denies LOC; pt denies neck or back back pain; pt with bruising and swelling to his orbital area; pt states that he has a hx of a corneal problem with his rt eye and needs a corneal transplant; pt states that he has limited vision in his rt eye and is concerned that with the swelling to his left eye he won't be able to see to get around; pt with swelling to left eye and yellowish green drainage from eye; pt denies visual changes

## 2016-02-11 NOTE — ED Notes (Signed)
Visual acuity screening completed , left eye reads at 20/200. Cant read on right eye, claimed blurry vision, awaiting for corneal transplant.

## 2016-02-11 NOTE — ED Notes (Signed)
Patient transported to CT 

## 2016-02-13 DIAGNOSIS — H40041 Steroid responder, right eye: Secondary | ICD-10-CM | POA: Diagnosis not present

## 2016-02-13 DIAGNOSIS — H40051 Ocular hypertension, right eye: Secondary | ICD-10-CM | POA: Diagnosis not present

## 2016-02-13 DIAGNOSIS — H35372 Puckering of macula, left eye: Secondary | ICD-10-CM | POA: Diagnosis not present

## 2016-02-13 DIAGNOSIS — H1820 Unspecified corneal edema: Secondary | ICD-10-CM | POA: Diagnosis not present

## 2016-02-13 DIAGNOSIS — H209 Unspecified iridocyclitis: Secondary | ICD-10-CM | POA: Diagnosis not present

## 2016-02-13 DIAGNOSIS — H353120 Nonexudative age-related macular degeneration, left eye, stage unspecified: Secondary | ICD-10-CM | POA: Diagnosis not present

## 2016-02-20 ENCOUNTER — Telehealth: Payer: Self-pay | Admitting: Internal Medicine

## 2016-02-20 MED ORDER — RAMIPRIL 5 MG PO CAPS: 5.0000 mg | ORAL_CAPSULE | Freq: Every day | ORAL | Status: DC

## 2016-02-20 MED ORDER — HYDROCHLOROTHIAZIDE 12.5 MG PO CAPS: 12.5000 mg | ORAL_CAPSULE | Freq: Every day | ORAL | Status: DC

## 2016-02-20 NOTE — Telephone Encounter (Signed)
New message      For [name] Pt want to know if [name] will call in his presc for ramipril 5mg  and HCTZ [facility]  [facility]  He was getting it from his PCP, Dr Hulan Fess, but he has dropped them as patients.  Pt understands that Luetta Nutting is out of the office.  OK to wait until she returns to ask her.

## 2016-02-20 NOTE — Telephone Encounter (Signed)
I left a message for the patient I will send these RX's in to [facility]. RX sent.

## 2016-02-27 DIAGNOSIS — H40041 Steroid responder, right eye: Secondary | ICD-10-CM | POA: Diagnosis not present

## 2016-02-27 DIAGNOSIS — H1820 Unspecified corneal edema: Secondary | ICD-10-CM | POA: Diagnosis not present

## 2016-02-27 DIAGNOSIS — H40051 Ocular hypertension, right eye: Secondary | ICD-10-CM | POA: Diagnosis not present

## 2016-02-27 DIAGNOSIS — H209 Unspecified iridocyclitis: Secondary | ICD-10-CM | POA: Diagnosis not present

## 2016-03-13 DIAGNOSIS — H209 Unspecified iridocyclitis: Secondary | ICD-10-CM | POA: Diagnosis not present

## 2016-03-13 DIAGNOSIS — H01001 Unspecified blepharitis right upper eyelid: Secondary | ICD-10-CM | POA: Diagnosis not present

## 2016-03-13 DIAGNOSIS — H40041 Steroid responder, right eye: Secondary | ICD-10-CM | POA: Diagnosis not present

## 2016-03-13 DIAGNOSIS — H40051 Ocular hypertension, right eye: Secondary | ICD-10-CM | POA: Diagnosis not present

## 2016-03-27 DIAGNOSIS — D0421 Carcinoma in situ of skin of right ear and external auricular canal: Secondary | ICD-10-CM | POA: Diagnosis not present

## 2016-03-27 DIAGNOSIS — C44222 Squamous cell carcinoma of skin of right ear and external auricular canal: Secondary | ICD-10-CM | POA: Diagnosis not present

## 2016-04-01 DIAGNOSIS — H40051 Ocular hypertension, right eye: Secondary | ICD-10-CM | POA: Diagnosis not present

## 2016-04-01 DIAGNOSIS — H01001 Unspecified blepharitis right upper eyelid: Secondary | ICD-10-CM | POA: Diagnosis not present

## 2016-04-01 DIAGNOSIS — H40041 Steroid responder, right eye: Secondary | ICD-10-CM | POA: Diagnosis not present

## 2016-04-01 DIAGNOSIS — H1820 Unspecified corneal edema: Secondary | ICD-10-CM | POA: Diagnosis not present

## 2016-04-19 DIAGNOSIS — Z961 Presence of intraocular lens: Secondary | ICD-10-CM | POA: Diagnosis not present

## 2016-04-19 DIAGNOSIS — H4051X4 Glaucoma secondary to other eye disorders, right eye, indeterminate stage: Secondary | ICD-10-CM | POA: Diagnosis not present

## 2016-04-19 DIAGNOSIS — H1811 Bullous keratopathy, right eye: Secondary | ICD-10-CM | POA: Diagnosis not present

## 2016-05-10 DIAGNOSIS — Z961 Presence of intraocular lens: Secondary | ICD-10-CM | POA: Diagnosis not present

## 2016-05-10 DIAGNOSIS — H4051X4 Glaucoma secondary to other eye disorders, right eye, indeterminate stage: Secondary | ICD-10-CM | POA: Diagnosis not present

## 2016-05-10 DIAGNOSIS — H1811 Bullous keratopathy, right eye: Secondary | ICD-10-CM | POA: Diagnosis not present

## 2016-05-24 ENCOUNTER — Telehealth: Payer: Self-pay | Admitting: Internal Medicine

## 2016-05-24 NOTE — Telephone Encounter (Signed)
I do not see where Dr Caryl Comes has ever filled this for the patient. Please advise. Thanks, MI

## 2016-05-24 NOTE — Telephone Encounter (Signed)
He will need to have his lipid panel checked here before we can refill this for him so we can make sure he is on the appropriate dose. Just find out what date he wants to come and I will schedule.  Thanks!

## 2016-05-24 NOTE — Telephone Encounter (Signed)
New message    Pt states that he is no longer seeing his PCP and needs a prescription for Lipitor.   *STAT* If patient is at the pharmacy, call can be transferred to refill team.   1. Which medications need to be refilled? (please list name of each medication and dose if known)  Lipitor 20 mg/ generic   2. Which pharmacy/location (including street and city if local pharmacy) is medication to be sent to[facility]   3. Do they need a 30 day or 90 day supply? [place]

## 2016-05-24 NOTE — Telephone Encounter (Signed)
Tried to reach patient, but was not successful. Will try again on Monday.

## 2016-05-27 NOTE — Telephone Encounter (Signed)
I have made several attempts to reach patient today and [facility] for him to call the office.

## 2016-05-28 NOTE — Telephone Encounter (Signed)
Spoke with patient and he stated that he will need to find transportation and call back to let us know what day he will be able to come.

## 2016-05-31 DIAGNOSIS — H269 Unspecified cataract: Secondary | ICD-10-CM | POA: Diagnosis not present

## 2016-05-31 DIAGNOSIS — R739 Hyperglycemia, unspecified: Secondary | ICD-10-CM | POA: Diagnosis not present

## 2016-05-31 DIAGNOSIS — Z79899 Other long term (current) drug therapy: Secondary | ICD-10-CM | POA: Diagnosis not present

## 2016-05-31 DIAGNOSIS — I2510 Atherosclerotic heart disease of native coronary artery without angina pectoris: Secondary | ICD-10-CM | POA: Diagnosis not present

## 2016-05-31 DIAGNOSIS — I472 Ventricular tachycardia, unspecified: Secondary | ICD-10-CM

## 2016-05-31 DIAGNOSIS — Z131 Encounter for screening for diabetes mellitus: Secondary | ICD-10-CM | POA: Diagnosis not present

## 2016-05-31 DIAGNOSIS — I498 Other specified cardiac arrhythmias: Secondary | ICD-10-CM

## 2016-05-31 DIAGNOSIS — E785 Hyperlipidemia, unspecified: Secondary | ICD-10-CM | POA: Diagnosis not present

## 2016-05-31 DIAGNOSIS — N183 Chronic kidney disease, stage 3 (moderate): Secondary | ICD-10-CM | POA: Diagnosis not present

## 2016-05-31 DIAGNOSIS — I4729 Other ventricular tachycardia: Secondary | ICD-10-CM

## 2016-05-31 DIAGNOSIS — I10 Essential (primary) hypertension: Secondary | ICD-10-CM | POA: Diagnosis not present

## 2016-05-31 HISTORY — DX: Ventricular tachycardia, unspecified: I47.20

## 2016-05-31 HISTORY — DX: Ventricular tachycardia: I47.2

## 2016-05-31 HISTORY — DX: Other ventricular tachycardia: I47.29

## 2016-05-31 HISTORY — DX: Atherosclerotic heart disease of native coronary artery without angina pectoris: I25.10

## 2016-05-31 HISTORY — DX: Other specified cardiac arrhythmias: I49.8

## 2016-06-13 DIAGNOSIS — I472 Ventricular tachycardia: Secondary | ICD-10-CM | POA: Diagnosis not present

## 2016-06-13 DIAGNOSIS — N183 Chronic kidney disease, stage 3 (moderate): Secondary | ICD-10-CM | POA: Diagnosis not present

## 2016-06-13 DIAGNOSIS — F17200 Nicotine dependence, unspecified, uncomplicated: Secondary | ICD-10-CM | POA: Diagnosis not present

## 2016-06-13 DIAGNOSIS — E785 Hyperlipidemia, unspecified: Secondary | ICD-10-CM | POA: Diagnosis not present

## 2016-06-13 DIAGNOSIS — H1811 Bullous keratopathy, right eye: Secondary | ICD-10-CM | POA: Diagnosis not present

## 2016-06-13 DIAGNOSIS — I10 Essential (primary) hypertension: Secondary | ICD-10-CM | POA: Diagnosis not present

## 2016-06-13 DIAGNOSIS — I2510 Atherosclerotic heart disease of native coronary artery without angina pectoris: Secondary | ICD-10-CM | POA: Diagnosis not present

## 2016-06-13 DIAGNOSIS — H59021 Cataract (lens) fragments in eye following cataract surgery, right eye: Secondary | ICD-10-CM | POA: Diagnosis not present

## 2016-06-13 HISTORY — PX: DESCEMETS STRIPPING AUTOMATED ENDOTHELIAL KERATOPLASTY: SHX1454

## 2016-06-14 DIAGNOSIS — Z48810 Encounter for surgical aftercare following surgery on the sense organs: Secondary | ICD-10-CM | POA: Diagnosis not present

## 2016-06-14 DIAGNOSIS — Z961 Presence of intraocular lens: Secondary | ICD-10-CM | POA: Diagnosis not present

## 2016-06-14 DIAGNOSIS — I2510 Atherosclerotic heart disease of native coronary artery without angina pectoris: Secondary | ICD-10-CM | POA: Diagnosis not present

## 2016-06-14 DIAGNOSIS — Z880 Allergy status to penicillin: Secondary | ICD-10-CM | POA: Diagnosis not present

## 2016-06-14 DIAGNOSIS — Z881 Allergy status to other antibiotic agents status: Secondary | ICD-10-CM | POA: Diagnosis not present

## 2016-06-14 DIAGNOSIS — Z87891 Personal history of nicotine dependence: Secondary | ICD-10-CM | POA: Diagnosis not present

## 2016-06-14 DIAGNOSIS — I129 Hypertensive chronic kidney disease with stage 1 through stage 4 chronic kidney disease, or unspecified chronic kidney disease: Secondary | ICD-10-CM | POA: Diagnosis not present

## 2016-06-14 DIAGNOSIS — E785 Hyperlipidemia, unspecified: Secondary | ICD-10-CM | POA: Diagnosis not present

## 2016-06-14 DIAGNOSIS — N183 Chronic kidney disease, stage 3 (moderate): Secondary | ICD-10-CM | POA: Diagnosis not present

## 2016-06-14 DIAGNOSIS — H1811 Bullous keratopathy, right eye: Secondary | ICD-10-CM | POA: Diagnosis not present

## 2016-06-14 DIAGNOSIS — H4010X0 Unspecified open-angle glaucoma, stage unspecified: Secondary | ICD-10-CM | POA: Diagnosis not present

## 2016-06-14 DIAGNOSIS — Z79899 Other long term (current) drug therapy: Secondary | ICD-10-CM | POA: Diagnosis not present

## 2016-06-14 DIAGNOSIS — Z9842 Cataract extraction status, left eye: Secondary | ICD-10-CM | POA: Diagnosis not present

## 2016-06-14 DIAGNOSIS — Z7982 Long term (current) use of aspirin: Secondary | ICD-10-CM | POA: Diagnosis not present

## 2016-06-14 DIAGNOSIS — Z9841 Cataract extraction status, right eye: Secondary | ICD-10-CM | POA: Diagnosis not present

## 2016-07-11 ENCOUNTER — Encounter: Payer: Self-pay | Admitting: Internal Medicine

## 2016-07-21 ENCOUNTER — Other Ambulatory Visit: Payer: Self-pay | Admitting: Internal Medicine

## 2016-07-24 ENCOUNTER — Encounter (INDEPENDENT_AMBULATORY_CARE_PROVIDER_SITE_OTHER): Payer: Self-pay

## 2016-07-24 ENCOUNTER — Ambulatory Visit (INDEPENDENT_AMBULATORY_CARE_PROVIDER_SITE_OTHER): Payer: 59 | Admitting: Internal Medicine

## 2016-07-24 VITALS — BP 138/76 | HR 57 | Ht 69.0 in | Wt 192.8 lb

## 2016-07-24 DIAGNOSIS — I499 Cardiac arrhythmia, unspecified: Secondary | ICD-10-CM

## 2016-07-24 DIAGNOSIS — I498 Other specified cardiac arrhythmias: Secondary | ICD-10-CM

## 2016-07-24 DIAGNOSIS — I493 Ventricular premature depolarization: Secondary | ICD-10-CM | POA: Diagnosis not present

## 2016-07-24 DIAGNOSIS — Z23 Encounter for immunization: Secondary | ICD-10-CM

## 2016-07-24 NOTE — Patient Instructions (Signed)

## 2016-07-24 NOTE — Progress Notes (Signed)
Patient Care Team: Gregor Hams, FNP as PCP - General (Family Medicine)   HPI  [name] is a 82 y.o. male Seen in followup for ventricular ectopy associated with functional bradycardia;  he has been treated with propafenone.  He is having no dizziness or metallic taste    The patient denies chest pain, shortness of breath, nocturnal dyspnea, orthopnea or peripheral edema.  There have been no palpitations, lightheadedness or syncope.    Blood pressure has been elevated. At home recordings are in the 150 range  .He has chronic kidney disease and is followed for this in [facility].   Date Propafenone QRS PR  11/17 225 108 300  7/16 225 116 296  4/15  100 --  10/13 225 112 240    Past Medical History:  Diagnosis Date  . Arthritis   . Bladder cancer ([place])   . CAD (coronary artery disease)    cath 02/28/11: dLM 10%, pLAD 20%, pRCA calcified nodule 50% (FFR 0.86 - not significant), mRCA 40-50%, mildly elevated filling pressures, EF 50-55%  . Cataract   . Chronic kidney disease   . Hard of hearing   . Hyperlipidemia   . Hypertension    LOV with EKG 06/11/11 Dr Caryl Comes  [facility]  . Prostate cancer ([place])    status post brachytherapy---in 2003  . Sleep apnea    STOP BANG SCORE 4  . Ventricular tachycardia, nonsustained/bigeminy    echo 6/12: EF 55%, basal inferolat AK, mild LVH    Past Surgical History:  Procedure Laterality Date  . APPENDECTOMY    . CARDIAC CATHETERIZATION  6/12  . CYSTOSCOPY  05/20/2012   Procedure: CYSTOSCOPY;  Surgeon: Alexis Frock, MD;  Location: [facility];  Service: Urology;  Laterality: N/A;  . CYSTOSCOPY  07/01/2012   Procedure: CYSTOSCOPY;  Surgeon: Molli Hazard, MD;  Location: [facility];  Service: Urology;  Laterality: N/A;  . EYE SURGERY     left cataract extraction with IOL  . SKIN CANCER EXCISION     x 4  . TRANSURETHRAL RESECTION OF BLADDER TUMOR  05/20/2012   Procedure: TRANSURETHRAL RESECTION OF BLADDER TUMOR (TURBT);   Surgeon: Alexis Frock, MD;  Location: [facility];  Service: Urology;  Laterality: N/A;  [place]   . TRANSURETHRAL RESECTION OF BLADDER TUMOR  07/01/2012   Procedure: TRANSURETHRAL RESECTION OF BLADDER TUMOR (TURBT);  Surgeon: Molli Hazard, MD;  Location: [facility];  Service: Urology;  Laterality: N/A;  Cystoscopy, TURBT, be prepared for Open Cystorraphy        Current Outpatient Prescriptions  Medication Sig Dispense Refill  . acetaminophen (TYLENOL) 500 MG tablet Take 500 mg by mouth every 6 (six) hours as needed (pain).     Marland Kitchen amLODipine (NORVASC) 5 MG tablet TAKE 1 TABLET (5 MG TOTAL) BY MOUTH DAILY. 30 tablet 5  . aspirin EC 81 MG tablet Take 81 mg by mouth.    Marland Kitchen atorvastatin (LIPITOR) 20 MG tablet Take 20 mg by mouth every evening.     . dorzolamide-timolol (COSOPT) 22.3-6.8 MG/ML ophthalmic solution Place 1 drop into the right eye 2 (two) times daily.    . hydrochlorothiazide (MICROZIDE) 12.5 MG capsule Take 1 capsule (12.5 mg total) by mouth daily with breakfast. 90 capsule 3  . Multiple Vitamin (MULTIVITAMIN WITH MINERALS) TABS Take 1 tablet by mouth daily.    . Omega-3 1000 MG CAPS Take 1,000 mg by mouth 2 (two) times daily.     Marland Kitchen  polyethylene glycol (MIRALAX / GLYCOLAX) packet Take 17 g by mouth.    . prednisoLONE acetate (PRED FORTE) 1 % ophthalmic suspension Place 1 drop into the right eye 4 (four) times daily.    . Probiotic Product (PHILLIPS COLON HEALTH PO) Take 1 capsule by mouth daily.     . ramipril (ALTACE) 5 MG capsule Take 1 capsule (5 mg total) by mouth daily. 90 capsule 3  . sodium chloride (MURO 128) 2 % ophthalmic solution 3 (three) times daily.    Marland Kitchen zinc gluconate 50 MG tablet Take 50 mg by mouth daily.    . DUREZOL 0.05 % EMUL 1 DROP IN RIGHT EYE THREE TIMES A DAY  1  . moxifloxacin (VIGAMOX) 0.5 % ophthalmic solution Place 1 drop into both eyes 3 (three) times daily. (Patient not taking: Reported on 07/24/2016) 3 mL 0  . Polyethyl Glycol-Propyl Glycol (SYSTANE  OP) Apply 2 drops to eye 4 (four) times daily.    . propafenone (RYTHMOL) 225 MG tablet TAKE 1 TABLET BY MOUTH TWICE A DAY (Patient not taking: Reported on 07/24/2016) 180 tablet 1   No current facility-administered medications for this visit.     Allergies  Allergen Reactions  . Neosporin [Neomycin-Bacitracin Zn-Polymyx] Other (See Comments)    Makes his skin red   . Penicillins Hives and Itching  . Penicillin G Rash    Review of Systems negative except from HPI and PMH  Physical Exam BP 138/76 (BP Location: Left Arm, Patient Position: Sitting, Cuff Size: Large)   Pulse (!) 57   Ht 5\' 9"  (1.753 m)   Wt 192 lb 12.8 oz (87.5 kg)   SpO2 97%   BMI 28.47 kg/m  Well developed and well nourished in no acute distress HENT normal E scleral and icterus clear Neck Supple JVP flat; carotids brisk and full Clear to ausculation  Regular rate and rhythm, no murmurs gallops or rub Soft with active bowel sounds No clubbing cyanosis  trEdema Alert and oriented, grossly normal motor and sensory function Skin Warm and Dry  ECG  SR 56 30/11/44 Axis -52  Assessment and  Plan  PVC  PACs/Atrial Tach  1 AVB   Hypertension    He is tolerating propafenone  There has been no furtehr interval increase in his PR interval from 294. We will plan to review this again in about 6 months on his current dose of propafenone.  His blood pressure is well-controlled. I've asked him to discuss with his PCP whether his amlodipine could be increased and his ramipril discontinued or the other way around.

## 2016-08-02 DIAGNOSIS — C678 Malignant neoplasm of overlapping sites of bladder: Secondary | ICD-10-CM | POA: Diagnosis not present

## 2016-08-02 DIAGNOSIS — Z8546 Personal history of malignant neoplasm of prostate: Secondary | ICD-10-CM | POA: Diagnosis not present

## 2016-08-19 ENCOUNTER — Other Ambulatory Visit: Payer: Self-pay | Admitting: *Deleted

## 2016-08-19 MED ORDER — PROPAFENONE HCL 225 MG PO TABS: 225.0000 mg | ORAL_TABLET | Freq: Two times a day (BID) | ORAL | 3 refills | Status: DC

## 2016-08-19 NOTE — Telephone Encounter (Signed)
Patient called and stated that at his recent office visit he filled out the sheet requesting that [facility] and [facility] do not have it. I will send rx. Patient aware.

## 2016-09-06 DIAGNOSIS — Z880 Allergy status to penicillin: Secondary | ICD-10-CM | POA: Diagnosis not present

## 2016-09-06 DIAGNOSIS — Z9842 Cataract extraction status, left eye: Secondary | ICD-10-CM | POA: Diagnosis not present

## 2016-09-06 DIAGNOSIS — N183 Chronic kidney disease, stage 3 (moderate): Secondary | ICD-10-CM | POA: Diagnosis not present

## 2016-09-06 DIAGNOSIS — I129 Hypertensive chronic kidney disease with stage 1 through stage 4 chronic kidney disease, or unspecified chronic kidney disease: Secondary | ICD-10-CM | POA: Diagnosis not present

## 2016-09-06 DIAGNOSIS — I2510 Atherosclerotic heart disease of native coronary artery without angina pectoris: Secondary | ICD-10-CM | POA: Diagnosis not present

## 2016-09-06 DIAGNOSIS — Z87891 Personal history of nicotine dependence: Secondary | ICD-10-CM | POA: Diagnosis not present

## 2016-09-06 DIAGNOSIS — Z881 Allergy status to other antibiotic agents status: Secondary | ICD-10-CM | POA: Diagnosis not present

## 2016-09-06 DIAGNOSIS — Z48810 Encounter for surgical aftercare following surgery on the sense organs: Secondary | ICD-10-CM | POA: Diagnosis not present

## 2016-09-06 DIAGNOSIS — H4010X0 Unspecified open-angle glaucoma, stage unspecified: Secondary | ICD-10-CM | POA: Diagnosis not present

## 2016-09-06 DIAGNOSIS — Z9841 Cataract extraction status, right eye: Secondary | ICD-10-CM | POA: Diagnosis not present

## 2016-09-06 DIAGNOSIS — H01009 Unspecified blepharitis unspecified eye, unspecified eyelid: Secondary | ICD-10-CM | POA: Diagnosis not present

## 2016-09-06 DIAGNOSIS — E785 Hyperlipidemia, unspecified: Secondary | ICD-10-CM | POA: Diagnosis not present

## 2016-09-06 DIAGNOSIS — Z7982 Long term (current) use of aspirin: Secondary | ICD-10-CM | POA: Diagnosis not present

## 2016-09-06 DIAGNOSIS — Z961 Presence of intraocular lens: Secondary | ICD-10-CM | POA: Diagnosis not present

## 2016-09-06 DIAGNOSIS — Z79899 Other long term (current) drug therapy: Secondary | ICD-10-CM | POA: Diagnosis not present

## 2016-10-24 DIAGNOSIS — C674 Malignant neoplasm of posterior wall of bladder: Secondary | ICD-10-CM | POA: Diagnosis not present

## 2016-11-14 DIAGNOSIS — N183 Chronic kidney disease, stage 3 (moderate): Secondary | ICD-10-CM | POA: Diagnosis not present

## 2016-11-14 DIAGNOSIS — T86841 Corneal transplant failure: Secondary | ICD-10-CM | POA: Diagnosis not present

## 2016-11-14 HISTORY — PX: DESCEMETS STRIPPING AUTOMATED ENDOTHELIAL KERATOPLASTY: SHX1454

## 2016-11-15 DIAGNOSIS — N183 Chronic kidney disease, stage 3 (moderate): Secondary | ICD-10-CM | POA: Diagnosis not present

## 2016-11-15 DIAGNOSIS — H1811 Bullous keratopathy, right eye: Secondary | ICD-10-CM | POA: Diagnosis not present

## 2016-11-15 DIAGNOSIS — Z880 Allergy status to penicillin: Secondary | ICD-10-CM | POA: Diagnosis not present

## 2016-11-15 DIAGNOSIS — I129 Hypertensive chronic kidney disease with stage 1 through stage 4 chronic kidney disease, or unspecified chronic kidney disease: Secondary | ICD-10-CM | POA: Diagnosis not present

## 2016-11-15 DIAGNOSIS — Z87891 Personal history of nicotine dependence: Secondary | ICD-10-CM | POA: Diagnosis not present

## 2016-11-15 DIAGNOSIS — H4010X0 Unspecified open-angle glaucoma, stage unspecified: Secondary | ICD-10-CM | POA: Diagnosis not present

## 2016-11-15 DIAGNOSIS — Z7982 Long term (current) use of aspirin: Secondary | ICD-10-CM | POA: Diagnosis not present

## 2016-11-15 DIAGNOSIS — H01009 Unspecified blepharitis unspecified eye, unspecified eyelid: Secondary | ICD-10-CM | POA: Diagnosis not present

## 2016-11-15 DIAGNOSIS — Z79899 Other long term (current) drug therapy: Secondary | ICD-10-CM | POA: Diagnosis not present

## 2016-11-15 DIAGNOSIS — Z9841 Cataract extraction status, right eye: Secondary | ICD-10-CM | POA: Diagnosis not present

## 2016-11-15 DIAGNOSIS — Z9842 Cataract extraction status, left eye: Secondary | ICD-10-CM | POA: Diagnosis not present

## 2016-11-15 DIAGNOSIS — Z881 Allergy status to other antibiotic agents status: Secondary | ICD-10-CM | POA: Diagnosis not present

## 2016-11-15 DIAGNOSIS — Z9889 Other specified postprocedural states: Secondary | ICD-10-CM | POA: Diagnosis not present

## 2016-11-15 DIAGNOSIS — E785 Hyperlipidemia, unspecified: Secondary | ICD-10-CM | POA: Diagnosis not present

## 2016-11-15 DIAGNOSIS — Z961 Presence of intraocular lens: Secondary | ICD-10-CM | POA: Diagnosis not present

## 2016-11-15 DIAGNOSIS — I2510 Atherosclerotic heart disease of native coronary artery without angina pectoris: Secondary | ICD-10-CM | POA: Diagnosis not present

## 2016-11-22 DIAGNOSIS — Z48810 Encounter for surgical aftercare following surgery on the sense organs: Secondary | ICD-10-CM | POA: Diagnosis not present

## 2016-11-22 DIAGNOSIS — Z961 Presence of intraocular lens: Secondary | ICD-10-CM | POA: Diagnosis not present

## 2016-11-22 DIAGNOSIS — H1811 Bullous keratopathy, right eye: Secondary | ICD-10-CM | POA: Diagnosis not present

## 2016-11-28 DIAGNOSIS — E785 Hyperlipidemia, unspecified: Secondary | ICD-10-CM | POA: Diagnosis not present

## 2016-12-06 ENCOUNTER — Other Ambulatory Visit: Payer: Self-pay | Admitting: Urology

## 2016-12-06 DIAGNOSIS — C671 Malignant neoplasm of dome of bladder: Secondary | ICD-10-CM | POA: Diagnosis not present

## 2016-12-13 DIAGNOSIS — Z881 Allergy status to other antibiotic agents status: Secondary | ICD-10-CM | POA: Diagnosis not present

## 2016-12-13 DIAGNOSIS — Z9842 Cataract extraction status, left eye: Secondary | ICD-10-CM | POA: Diagnosis not present

## 2016-12-13 DIAGNOSIS — I2510 Atherosclerotic heart disease of native coronary artery without angina pectoris: Secondary | ICD-10-CM | POA: Diagnosis not present

## 2016-12-13 DIAGNOSIS — Z9841 Cataract extraction status, right eye: Secondary | ICD-10-CM | POA: Diagnosis not present

## 2016-12-13 DIAGNOSIS — Z79899 Other long term (current) drug therapy: Secondary | ICD-10-CM | POA: Diagnosis not present

## 2016-12-13 DIAGNOSIS — Z961 Presence of intraocular lens: Secondary | ICD-10-CM | POA: Diagnosis not present

## 2016-12-13 DIAGNOSIS — N183 Chronic kidney disease, stage 3 (moderate): Secondary | ICD-10-CM | POA: Diagnosis not present

## 2016-12-13 DIAGNOSIS — H1811 Bullous keratopathy, right eye: Secondary | ICD-10-CM | POA: Diagnosis not present

## 2016-12-13 DIAGNOSIS — Z9889 Other specified postprocedural states: Secondary | ICD-10-CM | POA: Diagnosis not present

## 2016-12-13 DIAGNOSIS — Z880 Allergy status to penicillin: Secondary | ICD-10-CM | POA: Diagnosis not present

## 2016-12-13 DIAGNOSIS — Z7982 Long term (current) use of aspirin: Secondary | ICD-10-CM | POA: Diagnosis not present

## 2016-12-13 DIAGNOSIS — I129 Hypertensive chronic kidney disease with stage 1 through stage 4 chronic kidney disease, or unspecified chronic kidney disease: Secondary | ICD-10-CM | POA: Diagnosis not present

## 2016-12-13 DIAGNOSIS — E785 Hyperlipidemia, unspecified: Secondary | ICD-10-CM | POA: Diagnosis not present

## 2016-12-23 DIAGNOSIS — C671 Malignant neoplasm of dome of bladder: Secondary | ICD-10-CM | POA: Diagnosis not present

## 2017-01-03 ENCOUNTER — Emergency Department (HOSPITAL_COMMUNITY): Payer: 59

## 2017-01-03 ENCOUNTER — Inpatient Hospital Stay (HOSPITAL_COMMUNITY)
Admission: EM | Admit: 2017-01-03 | Discharge: 2017-01-07 | DRG: 644 | Disposition: A | Discharge status: Home Health | Payer: 59 | Attending: Family Medicine | Admitting: Family Medicine

## 2017-01-03 ENCOUNTER — Encounter (HOSPITAL_COMMUNITY): Payer: Self-pay | Admitting: Emergency Medicine

## 2017-01-03 ENCOUNTER — Inpatient Hospital Stay (HOSPITAL_COMMUNITY): Payer: 59

## 2017-01-03 DIAGNOSIS — I4891 Unspecified atrial fibrillation: Secondary | ICD-10-CM | POA: Diagnosis present

## 2017-01-03 DIAGNOSIS — Z801 Family history of malignant neoplasm of trachea, bronchus and lung: Secondary | ICD-10-CM

## 2017-01-03 DIAGNOSIS — Z961 Presence of intraocular lens: Secondary | ICD-10-CM | POA: Diagnosis present

## 2017-01-03 DIAGNOSIS — E222 Syndrome of inappropriate secretion of antidiuretic hormone: Principal | ICD-10-CM | POA: Diagnosis present

## 2017-01-03 DIAGNOSIS — Z8546 Personal history of malignant neoplasm of prostate: Secondary | ICD-10-CM | POA: Diagnosis not present

## 2017-01-03 DIAGNOSIS — R001 Bradycardia, unspecified: Secondary | ICD-10-CM | POA: Diagnosis present

## 2017-01-03 DIAGNOSIS — Z720 Tobacco use: Secondary | ICD-10-CM | POA: Diagnosis not present

## 2017-01-03 DIAGNOSIS — Z7982 Long term (current) use of aspirin: Secondary | ICD-10-CM | POA: Diagnosis not present

## 2017-01-03 DIAGNOSIS — Z8551 Personal history of malignant neoplasm of bladder: Secondary | ICD-10-CM

## 2017-01-03 DIAGNOSIS — I4729 Other ventricular tachycardia: Secondary | ICD-10-CM

## 2017-01-03 DIAGNOSIS — I2510 Atherosclerotic heart disease of native coronary artery without angina pectoris: Secondary | ICD-10-CM | POA: Diagnosis present

## 2017-01-03 DIAGNOSIS — I959 Hypotension, unspecified: Secondary | ICD-10-CM | POA: Diagnosis present

## 2017-01-03 DIAGNOSIS — I472 Ventricular tachycardia: Secondary | ICD-10-CM | POA: Diagnosis present

## 2017-01-03 DIAGNOSIS — I10 Essential (primary) hypertension: Secondary | ICD-10-CM | POA: Diagnosis not present

## 2017-01-03 DIAGNOSIS — I499 Cardiac arrhythmia, unspecified: Secondary | ICD-10-CM | POA: Diagnosis not present

## 2017-01-03 DIAGNOSIS — J9811 Atelectasis: Secondary | ICD-10-CM | POA: Diagnosis present

## 2017-01-03 DIAGNOSIS — R188 Other ascites: Secondary | ICD-10-CM | POA: Diagnosis present

## 2017-01-03 DIAGNOSIS — K56609 Unspecified intestinal obstruction, unspecified as to partial versus complete obstruction: Secondary | ICD-10-CM

## 2017-01-03 DIAGNOSIS — H9190 Unspecified hearing loss, unspecified ear: Secondary | ICD-10-CM | POA: Diagnosis present

## 2017-01-03 DIAGNOSIS — J90 Pleural effusion, not elsewhere classified: Secondary | ICD-10-CM | POA: Diagnosis present

## 2017-01-03 DIAGNOSIS — I498 Other specified cardiac arrhythmias: Secondary | ICD-10-CM | POA: Diagnosis present

## 2017-01-03 DIAGNOSIS — E860 Dehydration: Secondary | ICD-10-CM | POA: Diagnosis present

## 2017-01-03 DIAGNOSIS — Z79899 Other long term (current) drug therapy: Secondary | ICD-10-CM | POA: Diagnosis not present

## 2017-01-03 DIAGNOSIS — R296 Repeated falls: Secondary | ICD-10-CM | POA: Diagnosis present

## 2017-01-03 DIAGNOSIS — Z85828 Personal history of other malignant neoplasm of skin: Secondary | ICD-10-CM

## 2017-01-03 DIAGNOSIS — E871 Hypo-osmolality and hyponatremia: Secondary | ICD-10-CM

## 2017-01-03 DIAGNOSIS — T502X5A Adverse effect of carbonic-anhydrase inhibitors, benzothiadiazides and other diuretics, initial encounter: Secondary | ICD-10-CM | POA: Diagnosis present

## 2017-01-03 DIAGNOSIS — E785 Hyperlipidemia, unspecified: Secondary | ICD-10-CM | POA: Diagnosis present

## 2017-01-03 DIAGNOSIS — R109 Unspecified abdominal pain: Secondary | ICD-10-CM

## 2017-01-03 DIAGNOSIS — G4730 Sleep apnea, unspecified: Secondary | ICD-10-CM | POA: Diagnosis present

## 2017-01-03 DIAGNOSIS — Z883 Allergy status to other anti-infective agents status: Secondary | ICD-10-CM | POA: Diagnosis not present

## 2017-01-03 DIAGNOSIS — Z880 Allergy status to penicillin: Secondary | ICD-10-CM

## 2017-01-03 DIAGNOSIS — K5900 Constipation, unspecified: Secondary | ICD-10-CM | POA: Diagnosis present

## 2017-01-03 LAB — OSMOLALITY: OSMOLALITY: 248 mosm/kg — AB (ref 275–295)

## 2017-01-03 LAB — URINALYSIS, ROUTINE W REFLEX MICROSCOPIC
BILIRUBIN URINE: NEGATIVE
Glucose, UA: 50 mg/dL — AB
HGB URINE DIPSTICK: NEGATIVE
Ketones, ur: 20 mg/dL — AB
Leukocytes, UA: NEGATIVE
NITRITE: NEGATIVE
PROTEIN: 30 mg/dL — AB
Specific Gravity, Urine: 1.02 (ref 1.005–1.030)
Squamous Epithelial / LPF: NONE SEEN
pH: 5 (ref 5.0–8.0)

## 2017-01-03 LAB — BASIC METABOLIC PANEL
ANION GAP: 9 (ref 5–15)
ANION GAP: 9 (ref 5–15)
Anion gap: 9 (ref 5–15)
BUN: 15 mg/dL (ref 6–20)
BUN: 17 mg/dL (ref 6–20)
BUN: 16 mg/dL (ref 6–20)
CALCIUM: 8.4 mg/dL — AB (ref 8.9–10.3)
CALCIUM: 8.3 mg/dL — AB (ref 8.9–10.3)
CHLORIDE: 84 mmol/L — AB (ref 101–111)
CHLORIDE: 85 mmol/L — AB (ref 101–111)
CO2: 23 mmol/L (ref 22–32)
CO2: 23 mmol/L (ref 22–32)
CO2: 23 mmol/L (ref 22–32)
CREATININE: 0.74 mg/dL (ref 0.61–1.24)
CREATININE: 0.8 mg/dL (ref 0.61–1.24)
Calcium: 8.3 mg/dL — ABNORMAL LOW (ref 8.9–10.3)
Chloride: 84 mmol/L — ABNORMAL LOW (ref 101–111)
Creatinine, Ser: 0.85 mg/dL (ref 0.61–1.24)
GFR calc non Af Amer: >60 mL/min (ref >60)
GFR calc non Af Amer: >60 mL/min (ref >60)
GLUCOSE: 114 mg/dL — AB (ref 65–99)
Glucose, Bld: 117 mg/dL — ABNORMAL HIGH (ref 65–99)
Glucose, Bld: 128 mg/dL — ABNORMAL HIGH (ref 65–99)
POTASSIUM: 4 mmol/L (ref 3.5–5.1)
Potassium: 3.7 mmol/L (ref 3.5–5.1)
Potassium: 4 mmol/L (ref 3.5–5.1)
SODIUM: 116 mmol/L — AB (ref 135–145)
SODIUM: 117 mmol/L — AB (ref 135–145)
Sodium: 116 mmol/L — CL (ref 135–145)

## 2017-01-03 LAB — I-STAT CHEM 8, ED
BUN: 19 mg/dL (ref 6–20)
CHLORIDE: 79 mmol/L — AB (ref 101–111)
Calcium, Ion: 1.07 mmol/L — ABNORMAL LOW (ref 1.15–1.40)
Creatinine, Ser: 0.8 mg/dL (ref 0.61–1.24)
GLUCOSE: 142 mg/dL — AB (ref 65–99)
HEMATOCRIT: 47 % (ref 39.0–52.0)
HEMOGLOBIN: 16 g/dL (ref 13.0–17.0)
POTASSIUM: 4.3 mmol/L (ref 3.5–5.1)
SODIUM: 114 mmol/L — AB (ref 135–145)
TCO2: 24 mmol/L (ref 0–100)

## 2017-01-03 LAB — ECHOCARDIOGRAM COMPLETE
HEIGHTINCHES: 68 in
Weight: 3195.2 oz

## 2017-01-03 LAB — CBC WITH DIFFERENTIAL/PLATELET
BASOS ABS: 0 10*3/uL (ref 0.0–0.1)
Basophils Relative: 0 %
EOS ABS: 0.1 10*3/uL (ref 0.0–0.7)
EOS PCT: 1 %
HCT: 41.2 % (ref 39.0–52.0)
HEMOGLOBIN: 15 g/dL (ref 13.0–17.0)
LYMPHS ABS: 1.6 10*3/uL (ref 0.7–4.0)
Lymphocytes Relative: 13 %
MCH: 33.4 pg (ref 26.0–34.0)
MCHC: 36.4 g/dL — ABNORMAL HIGH (ref 30.0–36.0)
MCV: 91.8 fL (ref 78.0–100.0)
Monocytes Absolute: 1.3 10*3/uL — ABNORMAL HIGH (ref 0.1–1.0)
Monocytes Relative: 11 %
NEUTROS PCT: 75 %
Neutro Abs: 9.2 10*3/uL — ABNORMAL HIGH (ref 1.7–7.7)
PLATELETS: 223 10*3/uL (ref 150–400)
RBC: 4.49 MIL/uL (ref 4.22–5.81)
RDW: 13.2 % (ref 11.5–15.5)
WBC: 12.3 10*3/uL — AB (ref 4.0–10.5)

## 2017-01-03 LAB — I-STAT TROPONIN, ED: Troponin i, poc: 0.07 ng/mL (ref 0.00–0.08)

## 2017-01-03 LAB — TROPONIN I
TROPONIN I: 0.08 ng/mL — AB (ref <0.03)
TROPONIN I: 0.08 ng/mL — AB (ref <0.03)
Troponin I: 0.06 ng/mL (ref <0.03)

## 2017-01-03 LAB — COMPREHENSIVE METABOLIC PANEL
ALK PHOS: 82 U/L (ref 38–126)
ALT: 54 U/L (ref 17–63)
AST: 54 U/L — AB (ref 15–41)
Albumin: 4.5 g/dL (ref 3.5–5.0)
Anion gap: 10 (ref 5–15)
BUN: 18 mg/dL (ref 6–20)
CALCIUM: 9.2 mg/dL (ref 8.9–10.3)
CO2: 24 mmol/L (ref 22–32)
CREATININE: 0.96 mg/dL (ref 0.61–1.24)
Chloride: 80 mmol/L — ABNORMAL LOW (ref 101–111)
GFR calc Af Amer: >60 mL/min (ref >60)
GLUCOSE: 149 mg/dL — AB (ref 65–99)
POTASSIUM: 4.4 mmol/L (ref 3.5–5.1)
SODIUM: 114 mmol/L — AB (ref 135–145)
TOTAL PROTEIN: 7.3 g/dL (ref 6.5–8.1)
Total Bilirubin: 1.3 mg/dL — ABNORMAL HIGH (ref 0.3–1.2)

## 2017-01-03 LAB — MAGNESIUM: Magnesium: 1.8 mg/dL (ref 1.7–2.4)

## 2017-01-03 LAB — GLUCOSE, CAPILLARY: GLUCOSE-CAPILLARY: 131 mg/dL — AB (ref 65–99)

## 2017-01-03 LAB — OSMOLALITY, URINE: OSMOLALITY UR: 705 mosm/kg (ref 300–900)

## 2017-01-03 LAB — TSH: TSH: 2.141 u[IU]/mL (ref 0.350–4.500)

## 2017-01-03 LAB — SODIUM, URINE, RANDOM: SODIUM UR: 56 mmol/L

## 2017-01-03 MED ORDER — PROPAFENONE HCL 225 MG PO TABS
225.0000 mg | ORAL_TABLET | Freq: Two times a day (BID) | ORAL | Status: DC
  Administered 2017-01-03: 225 mg via ORAL
  Filled 2017-01-03 (×2): qty 1

## 2017-01-03 MED ORDER — AMLODIPINE BESYLATE 10 MG PO TABS
10.0000 mg | ORAL_TABLET | Freq: Every day | ORAL | Status: DC
  Administered 2017-01-03: 10 mg via ORAL
  Filled 2017-01-03 (×2): qty 1

## 2017-01-03 MED ORDER — DORZOLAMIDE HCL-TIMOLOL MAL 2-0.5 % OP SOLN
1.0000 [drp] | Freq: Two times a day (BID) | OPHTHALMIC | Status: DC
  Administered 2017-01-03 – 2017-01-07 (×9): 1 [drp] via OPHTHALMIC
  Filled 2017-01-03: qty 10

## 2017-01-03 MED ORDER — OMEGA-3-ACID ETHYL ESTERS 1 G PO CAPS
1000.0000 mg | ORAL_CAPSULE | Freq: Two times a day (BID) | ORAL | Status: DC
  Administered 2017-01-03 – 2017-01-07 (×9): 1000 mg via ORAL
  Filled 2017-01-03 (×9): qty 1

## 2017-01-03 MED ORDER — SODIUM CHLORIDE (HYPERTONIC) 2 % OP SOLN
1.0000 [drp] | Freq: Three times a day (TID) | OPHTHALMIC | Status: DC
  Administered 2017-01-03 – 2017-01-07 (×14): 1 [drp] via OPHTHALMIC
  Filled 2017-01-03: qty 15

## 2017-01-03 MED ORDER — ACETAMINOPHEN 500 MG PO TABS: 500.0000 mg | ORAL_TABLET | Freq: Four times a day (QID) | ORAL | Status: DC | PRN

## 2017-01-03 MED ORDER — PREDNISOLONE ACETATE 1 % OP SUSP
1.0000 [drp] | Freq: Three times a day (TID) | OPHTHALMIC | Status: DC
  Administered 2017-01-03 – 2017-01-07 (×14): 1 [drp] via OPHTHALMIC
  Filled 2017-01-03: qty 1

## 2017-01-03 MED ORDER — SORBITOL 70 % SOLN
960.0000 mL | TOPICAL_OIL | Freq: Once | ORAL | Status: AC
  Administered 2017-01-03: 960 mL via RECTAL
  Filled 2017-01-03: qty 240

## 2017-01-03 MED ORDER — IOPAMIDOL (ISOVUE-300) INJECTION 61%
INTRAVENOUS | Status: AC
  Filled 2017-01-03: qty 100

## 2017-01-03 MED ORDER — SODIUM CHLORIDE 0.9 % IV SOLN
INTRAVENOUS | Status: DC
  Administered 2017-01-03 – 2017-01-05 (×7): via INTRAVENOUS

## 2017-01-03 MED ORDER — AMLODIPINE BESYLATE 5 MG PO TABS
2.5000 mg | ORAL_TABLET | Freq: Every day | ORAL | Status: DC
  Filled 2017-01-03: qty 1

## 2017-01-03 MED ORDER — SODIUM CHLORIDE 0.9% FLUSH
3.0000 mL | Freq: Two times a day (BID) | INTRAVENOUS | Status: DC
  Administered 2017-01-06 – 2017-01-07 (×2): 3 mL via INTRAVENOUS

## 2017-01-03 MED ORDER — ENOXAPARIN SODIUM 40 MG/0.4ML ~~LOC~~ SOLN
40.0000 mg | SUBCUTANEOUS | Status: DC
  Administered 2017-01-03 – 2017-01-07 (×5): 40 mg via SUBCUTANEOUS
  Filled 2017-01-03 (×5): qty 0.4

## 2017-01-03 MED ORDER — ASPIRIN EC 81 MG PO TBEC
81.0000 mg | DELAYED_RELEASE_TABLET | Freq: Every day | ORAL | Status: DC
  Administered 2017-01-03 – 2017-01-07 (×5): 81 mg via ORAL
  Filled 2017-01-03 (×5): qty 1

## 2017-01-03 MED ORDER — ADULT MULTIVITAMIN W/MINERALS CH
1.0000 | ORAL_TABLET | Freq: Every day | ORAL | Status: DC
  Administered 2017-01-03 – 2017-01-07 (×5): 1 via ORAL
  Filled 2017-01-03 (×5): qty 1

## 2017-01-03 MED ORDER — ATORVASTATIN CALCIUM 20 MG PO TABS
20.0000 mg | ORAL_TABLET | Freq: Every evening | ORAL | Status: DC
  Administered 2017-01-03 – 2017-01-06 (×4): 20 mg via ORAL
  Filled 2017-01-03 (×4): qty 1

## 2017-01-03 MED ORDER — DIFLUPREDNATE 0.05 % OP EMUL
1.0000 [drp] | Freq: Three times a day (TID) | OPHTHALMIC | Status: DC
Start: 2017-01-03 — End: 2017-01-04

## 2017-01-03 MED ORDER — ZINC SULFATE 220 (50 ZN) MG PO CAPS
220.0000 mg | ORAL_CAPSULE | Freq: Every day | ORAL | Status: DC
  Administered 2017-01-03 – 2017-01-07 (×5): 220 mg via ORAL
  Filled 2017-01-03 (×5): qty 1

## 2017-01-03 MED ORDER — RISAQUAD PO CAPS
ORAL_CAPSULE | Freq: Every day | ORAL | Status: DC
  Administered 2017-01-03 – 2017-01-07 (×5): 1 via ORAL
  Filled 2017-01-03 (×5): qty 1

## 2017-01-03 MED ORDER — IOPAMIDOL (ISOVUE-300) INJECTION 61%
100.0000 mL | Freq: Once | INTRAVENOUS | Status: AC | PRN
  Administered 2017-01-03: 04:00:00 via INTRAVENOUS

## 2017-01-03 MED ORDER — ONDANSETRON HCL 4 MG PO TABS: 4.0000 mg | ORAL_TABLET | Freq: Four times a day (QID) | ORAL | Status: DC | PRN

## 2017-01-03 MED ORDER — POLYETHYLENE GLYCOL 3350 17 G PO PACK
17.0000 g | PACK | Freq: Two times a day (BID) | ORAL | Status: DC
  Administered 2017-01-03 – 2017-01-04 (×4): 17 g via ORAL
  Filled 2017-01-03 (×6): qty 1

## 2017-01-03 MED ORDER — METOPROLOL TARTRATE 25 MG PO TABS
25.0000 mg | ORAL_TABLET | Freq: Two times a day (BID) | ORAL | Status: DC
  Administered 2017-01-03 – 2017-01-07 (×7): 25 mg via ORAL
  Filled 2017-01-03 (×7): qty 1

## 2017-01-03 MED ORDER — SENNOSIDES-DOCUSATE SODIUM 8.6-50 MG PO TABS
1.0000 | ORAL_TABLET | Freq: Two times a day (BID) | ORAL | Status: DC
  Administered 2017-01-03 – 2017-01-06 (×7): 1 via ORAL
  Filled 2017-01-03 (×8): qty 1

## 2017-01-03 MED ORDER — ZOLPIDEM TARTRATE 5 MG PO TABS: 5.0000 mg | ORAL_TABLET | Freq: Every evening | ORAL | Status: DC | PRN

## 2017-01-03 MED ORDER — NEOMYCIN-POLYMYXIN-DEXAMETH 3.5-10000-0.1 OP OINT
TOPICAL_OINTMENT | Freq: Every day | OPHTHALMIC | Status: DC
  Administered 2017-01-04 – 2017-01-06 (×3): via OPHTHALMIC
  Filled 2017-01-03: qty 3.5

## 2017-01-03 MED ORDER — ONDANSETRON HCL 4 MG/2ML IJ SOLN: 4.0000 mg | Freq: Four times a day (QID) | INTRAMUSCULAR | Status: DC | PRN

## 2017-01-03 NOTE — ED Notes (Signed)
Pt assisted to standing position in  attempt to void and was unable to, pt encouraged to notify staff when needing to void in order to get a urine sample.

## 2017-01-03 NOTE — Progress Notes (Addendum)
CRITICAL VALUE ALERT  Critical value received:  Serum osmolarity 248  Date of notification:  01/03/17  Time of notification:  0915  Critical value read back:yes  Nurse who received alert:  April RN  MD notified (1st page): Netty  Time of first page:  0922  MD notified (2nd page):  Time of second page:  Responding MD Time MD responded: [phone]

## 2017-01-03 NOTE — ED Notes (Signed)
EKG given to EDP,Palumbo,MD. For review. 

## 2017-01-03 NOTE — Evaluation (Signed)
Occupational Therapy Evaluation Patient Details Name: [name] MRN: [id number] DOB: 1927/08/15 Today's Date: 01/03/2017    History of Present Illness  83 y.o. 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  Pt admitted for hyponatremia and new atrial fibrillation (cardiology following).   Clinical Impression   This 83 y/o M presents with the above. Pt reports up until a few days ago he was able to complete most of his ADLs independently. Pt lives with his spouse who works during the day. Pt will benefit from acute OT services to increase safety and independence with ADLs and functional mobility. Goals are for supervision-ModA.     Follow Up Recommendations  No OT follow up;Supervision/Assistance - 24 hour    Equipment Recommendations  3 in 1 bedside commode           Precautions / Restrictions Precautions Precautions: Fall Restrictions Weight Bearing Restrictions: No      Mobility Bed Mobility               General bed mobility comments: OOB to BSC at start of session   Transfers Overall transfer level: Needs assistance Equipment used: Rolling walker (2 wheeled) Transfers: Sit to/from Stand Sit to Stand: Min guard         General transfer comment: verbal cues for hand placement, min/guard for safety    Balance Overall balance assessment: Needs assistance Sitting-balance support: Feet supported;Bilateral upper extremity supported Sitting balance-Leahy Scale: Good     Standing balance support: Bilateral upper extremity supported Standing balance-Leahy Scale: Poor Standing balance comment: requires UE support                            ADL either performed or assessed with clinical judgement   ADL Overall ADL's : Needs  assistance/impaired Eating/Feeding: Independent;Sitting   Grooming: Wash/dry hands;Set up;Sitting   Upper Body Bathing: Min guard;Sitting   Lower Body Bathing: Minimal assistance;Sit to/from stand   Upper Body Dressing : Min guard;Sitting   Lower Body Dressing: Maximal assistance;Sit to/from stand   Toilet Transfer: Min guard;BSC;Stand-pivot   Toileting- Water quality scientist and Hygiene: Moderate assistance;Sit to/from stand Toileting - Clothing Manipulation Details (indicate cue type and reason): Assist for toilet hygiene      Functional mobility during ADLs: Min guard;Rolling walker General ADL Comments: Pt BP while sitting start of session 105/77, SOB noted during room level functional mobility with O2 sats 94% on room air after return to sitting (HR in 70s)                         Pertinent Vitals/Pain Pain Assessment: No/denies pain          Extremity/Trunk Assessment Upper Extremity Assessment Upper Extremity Assessment: Overall WFL for tasks assessed   Lower Extremity Assessment Lower Extremity Assessment: Generalized weakness       Communication Communication Communication: HOH   Cognition Arousal/Alertness: Awake/alert Behavior During Therapy: WFL for tasks assessed/performed Overall Cognitive Status: Within Functional Limits for tasks assessed                                     General Comments  Home Living Family/patient expects to be discharged to:: Private residence Living Arrangements: Spouse/significant other Available Help at Discharge: Available PRN/intermittently   Home Access: Stairs to enter (garage) Technical brewer of Steps: 2   Home Layout: One level     Bathroom Shower/Tub: Occupational psychologist: [place]: None          Prior Functioning/Environment Level of Independence: Independent                 OT Problem List: Decreased  strength;Decreased activity tolerance;Cardiopulmonary status limiting activity      OT Treatment/Interventions: Self-care/ADL training;Energy conservation;Therapeutic exercise;Therapeutic activities;DME and/or AE instruction;Patient/family education    OT Goals(Current goals can be found in the care plan section) Acute Rehab OT Goals Patient Stated Goal: to be more independent OT Goal Formulation: With patient Time For Goal Achievement: 01/10/17 Potential to Achieve Goals: Good ADL Goals Pt Will Perform Grooming: standing;with supervision Pt Will Perform Lower Body Dressing: with mod assist;with adaptive equipment;sit to/from stand Pt Will Transfer to Toilet: with supervision;ambulating;bedside commode (BSC over toilet) Pt Will Perform Toileting - Clothing Manipulation and hygiene: with min guard assist;sit to/from stand  OT Frequency: Min 2X/week               Co-evaluation PT/OT/SLP Co-Evaluation/Treatment: Yes Reason for Co-Treatment: For patient/therapist safety PT goals addressed during session: Mobility/safety with mobility OT goals addressed during session: ADL's and self-care      End of Session Equipment Utilized During Treatment: Gait belt;Rolling walker Nurse Communication: Other (comment) (DME needs )  Activity Tolerance: Patient tolerated treatment well Patient left: in chair;with call bell/phone within reach;with chair alarm set  OT Visit Diagnosis: Unsteadiness on feet (R26.81);Muscle weakness (generalized) (M62.81)                Time: 0459-9774 OT Time Calculation (min): 21 min Charges:  OT General Charges $OT Visit: 1 Procedure OT Evaluation $OT Eval Low Complexity: 1 Procedure G-Codes:     [name], OT Pager [phone] 01/03/2017   [name] 01/03/2017, 3:43 PM

## 2017-01-03 NOTE — Care Management Note (Signed)
Case Management Note  Patient Details  Name: [name] MRN: [id number] Date of Birth: Aug 02, 1927  Subjective/Objective:  83 y/o m admitted whyponatremia. Fromhome. PT cons-await recc.                  Action/Plan:d/c home.   Expected Discharge Date:                  Expected Discharge Plan:  [place]  In-House Referral:  Clinical Social Work  Discharge planning Services  CM Consult  Post Acute Care Choice:    Choice offered to:     DME Arranged:    DME Agency:     HH Arranged:    [place] Agency:     Status of Service:  In process, will continue to follow  If discussed at Long Length of Stay Meetings, dates discussed:    Additional Comments:  Dessa Phi, RN 01/03/2017, 1:57 PM

## 2017-01-03 NOTE — Evaluation (Deleted)
OT Cancellation Note  Patient Details Name: [name] MRN: [id number] DOB: May 09, 1927   Cancelled Treatment:    Reason Eval/Treat Not Completed: Medical issues which prohibited therapy; noted elevated troponin levels, will schedule OT eval this weekend.   [name], OT Pager [phone] 01/03/2017   [name] 01/03/2017, 12:41 PM

## 2017-01-03 NOTE — ED Provider Notes (Signed)
[facility] DEPT Provider Note   CSN: [id number] Arrival date & time: 01/03/17  0201   By signing my name below, I, Eunice Blase, attest that this documentation has been prepared under the direction and in the presence of Fidencio Duddy, MD. Electronically signed, Eunice Blase, ED Scribe. 01/03/17. 2:29 AM.   History   Chief Complaint Chief Complaint  Patient presents with  . Weakness   The history is provided by the patient and medical records. No language interpreter was used.  Weakness  Primary symptoms include no focal weakness, no loss of sensation, no loss of balance, no speech change, no memory loss, no movement disorder. Primary symptoms comment: global weakness. This is a new problem. Episode onset: 5 days. The problem has not changed since onset.There was no focality noted. There has been no fever. Pertinent negatives include no shortness of breath, no chest pain, no vomiting and no confusion. There were no medications administered prior to arrival. Associated medical issues do not include trauma or CVA.    [name] is a 83 y.o. male with h/o HLD, CAD, HTN, CKD, Bladder and prostate cancers, transported via EMS to the Emergency Department with concern for gradually worsening generalized weakness onset > 4 days ago. Pt notes associated dry cough, constipation, congestion, abdominal bloating, nausea, legs giving out and dizziness. He states he has taken miralax for constipation without relief. No other modifying factors noted. Records indicate this has been recurrent x > 1 month. Pt denies diarrhea, fever, chills, sore throat, dysuria, wheezing, abdominal pain or any other complaints at this time. PCP Tobie Lords, MD of [facility].  Past Medical History:  Diagnosis Date  . Arthritis   . Bladder cancer ([place])   . CAD (coronary artery disease)    cath 02/28/11: dLM 10%, pLAD 20%, pRCA calcified nodule 50% (FFR 0.86 - not significant), mRCA 40-50%, mildly elevated  filling pressures, EF 50-55%  . Cataract   . Chronic kidney disease   . Hard of hearing   . Hyperlipidemia   . Hypertension    LOV with EKG 06/11/11 Dr Caryl Comes  [facility]  . Prostate cancer ([place])    status post brachytherapy---in 2003  . Sleep apnea    STOP BANG SCORE 4  . Ventricular tachycardia, nonsustained/bigeminy    echo 6/12: EF 55%, basal inferolat AK, mild LVH    Patient Active Problem List   Diagnosis Date Noted  . First degree AV block 06/28/2013  . Bladder cancer ([place]) 07/01/2012  . Cataracts, bilateral 03/14/2011  . Ventricular bigeminy   . Ventricular tachycardia, nonsustained   . Nonobstructive CAD (coronary artery disease)   . Hypertension   . Hyperlipidemia     Past Surgical History:  Procedure Laterality Date  . APPENDECTOMY    . CARDIAC CATHETERIZATION  6/12  . CYSTOSCOPY  05/20/2012   Procedure: CYSTOSCOPY;  Surgeon: Alexis Frock, MD;  Location: [facility] ORS;  Service: Urology;  Laterality: N/A;  . CYSTOSCOPY  07/01/2012   Procedure: CYSTOSCOPY;  Surgeon: Molli Hazard, MD;  Location: [facility];  Service: Urology;  Laterality: N/A;  . EYE SURGERY     left cataract extraction with IOL  . SKIN CANCER EXCISION     x 4  . TRANSURETHRAL RESECTION OF BLADDER TUMOR  05/20/2012   Procedure: TRANSURETHRAL RESECTION OF BLADDER TUMOR (TURBT);  Surgeon: Alexis Frock, MD;  Location: [facility] OR[facility];  Service: Urology;  Laterality: N/A;  [place]   . TRANSURETHRAL RESECTION OF BLADDER TUMOR  07/01/2012  Procedure: TRANSURETHRAL RESECTION OF BLADDER TUMOR (TURBT);  Surgeon: Molli Hazard, MD;  Location: [facility];  Service: Urology;  Laterality: N/A;  Cystoscopy, TURBT, be prepared for Open Cystorraphy           Home Medications    Prior to Admission medications   Medication Sig Start Date End Date Taking? Authorizing Provider  acetaminophen (TYLENOL) 500 MG tablet Take 500 mg by mouth every 6 (six) hours as needed (pain).    Yes Historical Provider, MD  aspirin EC 81  MG tablet Take 81 mg by mouth.   Yes Historical Provider, MD  atorvastatin (LIPITOR) 20 MG tablet Take 20 mg by mouth every evening.    Yes Historical Provider, MD  dorzolamide-timolol (COSOPT) 22.3-6.8 MG/ML ophthalmic solution Place 1 drop into the right eye 2 (two) times daily. 04/19/16  Yes Historical Provider, MD  DUREZOL 0.05 % EMUL 1 DROP IN RIGHT EYE THREE TIMES A DAY 10/06/15  Yes Historical Provider, MD  hydrochlorothiazide (MICROZIDE) 12.5 MG capsule Take 1 capsule (12.5 mg total) by mouth daily with breakfast. 02/20/16  Yes Deboraha Sprang, MD  Multiple Vitamin (MULTIVITAMIN WITH MINERALS) TABS Take 1 tablet by mouth daily.   Yes Historical Provider, MD  neomycin-polymyxin b-dexamethasone (MAXITROL) 3.5-10000-0.1 OINT as directed. 0.25 inch to left eye at bedtime 09/06/16  Yes Historical Provider, MD  Omega-3 1000 MG CAPS Take 1,000 mg by mouth 2 (two) times daily.    Yes Historical Provider, MD  polyethylene glycol (MIRALAX / GLYCOLAX) packet Take 17 g by mouth daily.    Yes Historical Provider, MD  prednisoLONE acetate (PRED FORTE) 1 % ophthalmic suspension Place 1 drop into both eyes 3 (three) times daily.   Yes Historical Provider, MD  Probiotic Product ([place]) Take 1 capsule by mouth daily.    Yes Historical Provider, MD  propafenone (RYTHMOL) 225 MG tablet Take 1 tablet (225 mg total) by mouth 2 (two) times daily. 08/19/16  Yes Deboraha Sprang, MD  ramipril (ALTACE) 5 MG capsule Take 1 capsule (5 mg total) by mouth daily. 02/20/16  Yes Deboraha Sprang, MD  sodium chloride (MURO 128) 2 % ophthalmic solution Place 1 drop into both eyes 3 (three) times daily.  04/19/16  Yes Historical Provider, MD  zinc gluconate 50 MG tablet Take 50 mg by mouth daily.   Yes Historical Provider, MD  amLODipine (NORVASC) 5 MG tablet TAKE 1 TABLET (5 MG TOTAL) BY MOUTH DAILY. Patient not taking: Reported on 01/03/2017 06/05/15   Deboraha Sprang, MD  moxifloxacin (VIGAMOX) 0.5 % ophthalmic solution  Place 1 drop into both eyes 3 (three) times daily. Patient not taking: Reported on 07/24/2016 02/11/16   Kethan Papadopoulos, MD  Polyethyl Glycol-Propyl Glycol (SYSTANE OP) Apply 2 drops to eye 4 (four) times daily.    Historical Provider, MD    Family History Family History  Problem Relation Age of Onset  . Arthritis Son   . Heart Problems Son     Aortic valve replacement  . Lung cancer Daughter   . Heart attack Neg Hx   . Stroke Neg Hx   . Hypertension Neg Hx     Social History Social History  Substance Use Topics  . Smoking status: Former Smoker    Types: Cigars    Quit date: 05/20/1995  . Smokeless tobacco: Current User    Types: Chew  . Alcohol use Yes     Comment: 6 beer week     Allergies  Neosporin [neomycin-bacitracin zn-polymyx]; Penicillins; and Penicillin g   Review of Systems Review of Systems  Constitutional: Negative for fatigue and fever.  HENT: Positive for congestion. Negative for sore throat.   Respiratory: Positive for cough. Negative for shortness of breath and wheezing.   Cardiovascular: Negative for chest pain.  Gastrointestinal: Positive for abdominal distention (bloating), constipation and nausea. Negative for abdominal pain, diarrhea and vomiting.  Genitourinary: Negative for dysuria.  Neurological: Negative for speech change, focal weakness, syncope and loss of balance.  Psychiatric/Behavioral: Negative for confusion and memory loss.  All other systems reviewed and are negative.   Physical Exam Updated Vital Signs Pulse 77   Resp 20   Ht 5\' 8"  (1.727 m)   Wt 180 lb (81.6 kg)   SpO2 96%   BMI 27.37 kg/m   Physical Exam  Constitutional: He is oriented to person, place, and time. He appears well-developed and well-nourished. No distress.  HENT:  Head: Normocephalic and atraumatic.  Mouth/Throat: Oropharynx is clear and moist and mucous membranes are normal. No oropharyngeal exudate.  Eyes: Conjunctivae and EOM are normal. Pupils are equal,  round, and reactive to light.  Neck: Trachea normal and normal range of motion. Neck supple. Carotid bruit is not present. No tracheal deviation present.  Cardiovascular: Normal rate and intact distal pulses.   Pulmonary/Chest: Effort normal and breath sounds normal. No stridor. He has no wheezes. He has no rales.  Abdominal: Soft. He exhibits distension. He exhibits no mass. There is no tenderness. There is no rebound and no guarding.  Gassy, very constipated  Musculoskeletal: He exhibits no edema, tenderness or deformity.  Compartments soft  Neurological: He is alert and oriented to person, place, and time. He displays normal reflexes.  Skin: Skin is warm and dry. Capillary refill takes less than 2 seconds.  Psychiatric: He has a normal mood and affect.  Nursing note and vitals reviewed.    ED Treatments / Results   Vitals:   01/03/17 0226  BP: (!) 196/117  Pulse: 77  Resp: 20    DIAGNOSTIC STUDIES: Oxygen Saturation is 95% on RA, adequate by my interpretation.  COORDINATION OF CARE: 2:18 AM-Discussed next steps with pt. Pt verbalized understanding and is agreeable with the plan. Will order imaging and labs.   Labs (all labs ordered are listed, but only abnormal results are displayed)  Results for orders placed or performed during the hospital encounter of 01/03/17  CBC with Differential/Platelet  Result Value Ref Range   WBC 12.3 (H) 4.0 - 10.5 K/uL   RBC 4.49 4.22 - 5.81 MIL/uL   Hemoglobin 15.0 13.0 - 17.0 g/dL   HCT 41.2 39.0 - 52.0 %   MCV 91.8 78.0 - 100.0 fL   MCH 33.4 26.0 - 34.0 pg   MCHC 36.4 (H) 30.0 - 36.0 g/dL   RDW 13.2 11.5 - 15.5 %   Platelets 223 150 - 400 K/uL   Neutrophils Relative % 75 %   Neutro Abs 9.2 (H) 1.7 - 7.7 K/uL   Lymphocytes Relative 13 %   Lymphs Abs 1.6 0.7 - 4.0 K/uL   Monocytes Relative 11 %   Monocytes Absolute 1.3 (H) 0.1 - 1.0 K/uL   Eosinophils Relative 1 %   Eosinophils Absolute 0.1 0.0 - 0.7 K/uL   Basophils Relative 0 %    Basophils Absolute 0.0 0.0 - 0.1 K/uL  Comprehensive metabolic panel  Result Value Ref Range   Sodium 114 (LL) 135 - 145 mmol/L   Potassium 4.4  3.5 - 5.1 mmol/L   Chloride 80 (L) 101 - 111 mmol/L   CO2 24 22 - 32 mmol/L   Glucose, Bld 149 (H) 65 - 99 mg/dL   BUN 18 6 - 20 mg/dL   Creatinine, Ser 0.96 0.61 - 1.24 mg/dL   Calcium 9.2 8.9 - 10.3 mg/dL   Total Protein 7.3 6.5 - 8.1 g/dL   Albumin 4.5 3.5 - 5.0 g/dL   AST 54 (H) 15 - 41 U/L   ALT 54 17 - 63 U/L   Alkaline Phosphatase 82 38 - 126 U/L   Total Bilirubin 1.3 (H) 0.3 - 1.2 mg/dL   GFR calc non Af Amer >60 >60 mL/min   GFR calc Af Amer >60 >60 mL/min   Anion gap 10 5 - 15  I-Stat Chem 8, ED  Result Value Ref Range   Sodium 114 (LL) 135 - 145 mmol/L   Potassium 4.3 3.5 - 5.1 mmol/L   Chloride 79 (L) 101 - 111 mmol/L   BUN 19 6 - 20 mg/dL   Creatinine, Ser 0.80 0.61 - 1.24 mg/dL   Glucose, Bld 142 (H) 65 - 99 mg/dL   Calcium, Ion 1.07 (L) 1.15 - 1.40 mmol/L   TCO2 24 0 - 100 mmol/L   Hemoglobin 16.0 13.0 - 17.0 g/dL   HCT 47.0 39.0 - 52.0 %   Comment NOTIFIED PHYSICIAN   I-stat troponin, ED  Result Value Ref Range   Troponin i, poc 0.07 0.00 - 0.08 ng/mL   Comment 3           Ct Head Wo Contrast  Result Date: 01/03/2017 CLINICAL DATA:  Increasing weakness for 4 days. Dizziness. History of bladder cancer and prostate cancer. Hypertension. EXAM: CT HEAD WITHOUT CONTRAST TECHNIQUE: Contiguous axial images were obtained from the base of the skull through the vertex without intravenous contrast. COMPARISON:  02/11/2016 FINDINGS: Brain: Mild diffuse cerebral atrophy. Ventricular dilatation consistent with central atrophy. Low-attenuation changes in the deep white matter consistent with small vessel ischemia. No mass effect or midline shift. No abnormal extra-axial fluid collections. Gray-white matter junctions are distinct. Basal cisterns are not effaced. No acute intracranial hemorrhage. Vascular: Intracranial arterial  vascular calcifications are present. Skull: No depressed skull fractures. Sinuses/Orbits: Mucosal thickening in the paranasal sinuses. No acute air-fluid levels. Mastoid air cells are not opacified. Other: No significant changes in the intracranial contents since previous study. IMPRESSION: No acute intracranial abnormalities. Mild chronic atrophy and small vessel ischemic changes. Electronically Signed   By: Lucienne Capers M.D.   On: 01/03/2017 02:59   Dg Abd Acute W/chest  Result Date: 01/03/2017 CLINICAL DATA:  Nausea, vomiting, mid abdominal pain, and weakness for 48 hours. EXAM: DG ABDOMEN ACUTE W/ 1V CHEST COMPARISON:  CT abdomen and pelvis 06/24/2014 FINDINGS: Shallow inspiration. Infiltration or atelectasis in the right lung base. Cardiac enlargement without vascular congestion. No blunting of costophrenic angles. No pneumothorax. Scattered gas and stool throughout the colon without distention. Mildly distended gas-filled small bowel could represent early small bowel obstruction or enteritis. No abnormal air-fluid levels. No free air. No radiopaque stones. Degenerative changes in the spine and hips. IMPRESSION: Infiltration or atelectasis in the right lung base. Cardiac enlargement without vascular congestion. Mildly dilated gas-filled small bowel may indicate early small bowel obstruction or enteritis. No free air. Electronically Signed   By: Lucienne Capers M.D.   On: 01/03/2017 02:56    EKG  EKG Interpretation  Date/Time:  Friday Vernis Eid 20 2018 02:25:06 EDT Ventricular  Rate:  85 PR Interval:    QRS Duration: 126 QT Interval:  401 QTC Calculation: 477 R Axis:   -74 Text Interpretation:  Atrial fibrillation Nonspecific IVCD with LAD Anteroseptal infarct, age indeterminate Confirmed by [facility]  MD, Verba Ainley ([id number]) on 01/03/2017 3:02:47 AM       Radiology Ct Head Wo Contrast  Result Date: 01/03/2017 CLINICAL DATA:  Increasing weakness for 4 days. Dizziness. History of bladder  cancer and prostate cancer. Hypertension. EXAM: CT HEAD WITHOUT CONTRAST TECHNIQUE: Contiguous axial images were obtained from the base of the skull through the vertex without intravenous contrast. COMPARISON:  02/11/2016 FINDINGS: Brain: Mild diffuse cerebral atrophy. Ventricular dilatation consistent with central atrophy. Low-attenuation changes in the deep white matter consistent with small vessel ischemia. No mass effect or midline shift. No abnormal extra-axial fluid collections. Gray-white matter junctions are distinct. Basal cisterns are not effaced. No acute intracranial hemorrhage. Vascular: Intracranial arterial vascular calcifications are present. Skull: No depressed skull fractures. Sinuses/Orbits: Mucosal thickening in the paranasal sinuses. No acute air-fluid levels. Mastoid air cells are not opacified. Other: No significant changes in the intracranial contents since previous study. IMPRESSION: No acute intracranial abnormalities. Mild chronic atrophy and small vessel ischemic changes. Electronically Signed   By: Lucienne Capers M.D.   On: 01/03/2017 02:59   Dg Abd Acute W/chest  Result Date: 01/03/2017 CLINICAL DATA:  Nausea, vomiting, mid abdominal pain, and weakness for 48 hours. EXAM: DG ABDOMEN ACUTE W/ 1V CHEST COMPARISON:  CT abdomen and pelvis 06/24/2014 FINDINGS: Shallow inspiration. Infiltration or atelectasis in the right lung base. Cardiac enlargement without vascular congestion. No blunting of costophrenic angles. No pneumothorax. Scattered gas and stool throughout the colon without distention. Mildly distended gas-filled small bowel could represent early small bowel obstruction or enteritis. No abnormal air-fluid levels. No free air. No radiopaque stones. Degenerative changes in the spine and hips. IMPRESSION: Infiltration or atelectasis in the right lung base. Cardiac enlargement without vascular congestion. Mildly dilated gas-filled small bowel may indicate early small bowel  obstruction or enteritis. No free air. Electronically Signed   By: Lucienne Capers M.D.   On: 01/03/2017 02:56    Procedures Procedures (including critical care time)  Medications Ordered in ED Medications  0.9 %  sodium chloride infusion ( Intravenous New Bag/Given 01/03/17 0340)  iopamidol (ISOVUE-300) 61 % injection (not administered)     NGT placed for SBO seen on XRay  MDM Reviewed: nursing note and vitals Interpretation: labs, ECG and x-ray (severe hyponatremia requiring admission, correction rate with NSS is 524 cc/hr, no PNA on cxr by me no acute findings on head ct by me) Total time providing critical care: 30-74 minutes. This excludes time spent performing separately reportable procedures and services. Consults: admitting MD  CRITICAL CARE Performed by: Carlisle Beers Total critical care time: 61 minutes Critical care time was exclusive of separately billable procedures and treating other patients. Critical care was necessary to treat or prevent imminent or life-threatening deterioration. Critical care was time spent personally by me on the following activities: development of treatment plan with patient and/or surrogate as well as nursing, discussions with consultants, evaluation of patient's response to treatment, examination of patient, obtaining history from patient or surrogate, ordering and performing treatments and interventions, ordering and review of laboratory studies, ordering and review of radiographic studies, pulse oximetry and re-evaluation of patient's condition.  Final Clinical Impressions(s) / ED Diagnoses   Final diagnoses:  Hyponatremia  Will admit to medicine   I personally performed the services  described in this documentation, which was scribed in my presence. The recorded information has been reviewed and is accurate.      Veatrice Kells, MD 01/03/17 [phone]

## 2017-01-03 NOTE — ED Notes (Signed)
Pt. Unable to stand due being weak and nausea.

## 2017-01-03 NOTE — ED Notes (Addendum)
Dr. Randal Buba instructed to hold out on insertion of NGT for further evaluation of imaging.

## 2017-01-03 NOTE — Progress Notes (Addendum)
CRITICAL VALUE ALERT  Critical value received:  troponin  Date of notification:  01/03/17  Time of notification:  0722  Critical value read back:Yes.    Nurse who received alert:  Lesa RN  MD notified (1st page): Netty  Time of first page:  0723  MD notified (2nd page):  Time of second page:  Responding MD:  Teryl Lucy  Time MD responded: [phone]

## 2017-01-03 NOTE — Progress Notes (Signed)
CRITICAL VALUE ALERT  Critical value received:  Na 117  Date of notification:  01/03/17  Time of notification: 2135  Critical value read back:Yes.    Nurse who received alert:  Thana Farr  MD notified (1st page):  Walden Field  Time of first page:  2136  MD notified (2nd page):  Time of second page:  Responding MD:  Walden Field  Time MD responded:  2138

## 2017-01-03 NOTE — Consult Note (Addendum)
CARDIOLOGY CONSULT NOTE   Patient ID: [name] MRN: [id number] DOB/AGE: Nov 17, 1926 83 y.o.  Admit date: 01/03/2017  Primary Physician   Tobie Lords D, FNP Primary Cardiologist   Dr Caryl Comes 07/24/2016 Reason for Consultation  New afib Requesting MD: Dr Teryl Lucy  HPI: [name] is a 83 y.o. male with hx of vent ectopy>>functional bradycardia, on propafenone, non-obs CAD at cath 2012, CKD, HTN, HLD, nl EF at echo 2012, bladder and prostate CA, HOH  Pt is being seen today for the evaluation of atrial fib at the request of Dr Teryl Lucy. His son is present. The patient is not wearing his hearing aids.  [name] was admitted 01/03/2017 for constipation, nausea, abdominal distention and generalized weakness. He was in atrial fibrillation on admission, cardiology was asked to evaluate him.  [name] has no idea he is in atrial fibrillation. Consistent with his lack of awareness of his PVCs and brief runs of nonsustained VT, he does not feel the atrial fibrillation at all. Specifically, he has no history of palpitations, presyncope or syncope. He has orthostatic dizziness that he does not believe is consistent. He makes sure to stand still when he stands up out of a chair or gets up out of bed. He has not fallen secondary to this. The orthostatic dizziness may have been a little bit worse in the last several weeks. He does not check his heart rate or blood pressure.  However, he has had several mechanical falls. He fell out of his house one time because he was carrying things and got off balance reaching out to catch something that was falling. He had another fall that he also describes as a mechanical fall where his head hit the wall and punched a hole in it. He only has one ER visit for a fall in the past year and that was in May 2017.  He exerts himself very little because of his vision problems. He doesn't walk very well and admits that he should probably use a walker. He denies  chest pain or shortness of breath with exertion. He denies lower extremity edema, orthopnea, or PND.  Patient states he takes a baby aspirin daily but was told he should not use any other NSAIDs. He takes Tylenol for pain. He believes this is because of his kidney function.   Past Medical History:  Diagnosis Date  . Arthritis   . Bladder cancer ([place])   . CAD (coronary artery disease)    cath 02/28/11: dLM 10%, pLAD 20%, pRCA calcified nodule 50% (FFR 0.86 - not significant), mRCA 40-50%, mildly elevated filling pressures, EF 50-55%  . Cataract   . Chronic kidney disease   . Hard of hearing   . Hyperlipidemia   . Hypertension    LOV with EKG 06/11/11 Dr Caryl Comes  [facility]  . Prostate cancer ([place])    status post brachytherapy---in 2003  . Sleep apnea    STOP BANG SCORE 4  . Ventricular tachycardia, nonsustained/bigeminy    echo 6/12: EF 55%, basal inferolat AK, mild LVH     Past Surgical History:  Procedure Laterality Date  . APPENDECTOMY    . CARDIAC CATHETERIZATION  6/12  . CYSTOSCOPY  05/20/2012   Procedure: CYSTOSCOPY;  Surgeon: Alexis Frock, MD;  Location: [facility];  Service: Urology;  Laterality: N/A;  . CYSTOSCOPY  07/01/2012   Procedure: CYSTOSCOPY;  Surgeon: Molli Hazard, MD;  Location: [facility];  Service: Urology;  Laterality: N/A;  .  EYE SURGERY     left cataract extraction with IOL  . SKIN CANCER EXCISION     x 4  . TRANSURETHRAL RESECTION OF BLADDER TUMOR  05/20/2012   Procedure: TRANSURETHRAL RESECTION OF BLADDER TUMOR (TURBT);  Surgeon: Alexis Frock, MD;  Location: [facility];  Service: Urology;  Laterality: N/A;  [place]   . TRANSURETHRAL RESECTION OF BLADDER TUMOR  07/01/2012   Procedure: TRANSURETHRAL RESECTION OF BLADDER TUMOR (TURBT);  Surgeon: Molli Hazard, MD;  Location: [facility];  Service: Urology;  Laterality: N/A;  Cystoscopy, TURBT, be prepared for Open Cystorraphy        Allergies  Allergen Reactions  . Neosporin [Neomycin-Bacitracin Zn-Polymyx]  Other (See Comments)    Makes his skin red   . Penicillins Hives and Itching  . Penicillin G Rash    I have reviewed the patient's current medications . acidophilus   Oral Daily  . amLODipine  10 mg Oral Daily  . aspirin EC  81 mg Oral Daily  . atorvastatin  20 mg Oral QPM  . Difluprednate  1 drop Left Eye TID  . dorzolamide-timolol  1 drop Right Eye BID  . enoxaparin (LOVENOX) injection  40 mg Subcutaneous Q24H  . multivitamin with minerals  1 tablet Oral Daily  . neomycin-polymyxin b-dexamethasone   Left Eye QHS  . omega-3 acid ethyl esters  1,000 mg Oral BID  . polyethylene glycol  17 g Oral BID  . prednisoLONE acetate  1 drop Both Eyes TID  . propafenone  225 mg Oral BID  . senna-docusate  1 tablet Oral BID  . sodium chloride  1 drop Right Eye TID  . sodium chloride flush  3 mL Intravenous Q12H  . sorbitol, milk of mag, mineral oil, glycerin (SMOG) enema  960 mL Rectal Once  . zinc sulfate  220 mg Oral Daily   . sodium chloride 100 mL/hr at 01/03/17 0447   acetaminophen, ondansetron **OR** ondansetron (ZOFRAN) IV, zolpidem  Prior to Admission medications   Medication Sig Start Date End Date Taking? Authorizing Provider  acetaminophen (TYLENOL) 500 MG tablet Take 500 mg by mouth every 6 (six) hours as needed (pain).    Yes Historical Provider, MD  aspirin EC 81 MG tablet Take 81 mg by mouth.   Yes Historical Provider, MD  atorvastatin (LIPITOR) 20 MG tablet Take 20 mg by mouth every evening.    Yes Historical Provider, MD  dorzolamide-timolol (COSOPT) 22.3-6.8 MG/ML ophthalmic solution Place 1 drop into the right eye 2 (two) times daily. 04/19/16  Yes Historical Provider, MD  DUREZOL 0.05 % EMUL 1 DROP IN RIGHT EYE THREE TIMES A DAY 10/06/15  Yes Historical Provider, MD  hydrochlorothiazide (MICROZIDE) 12.5 MG capsule Take 1 capsule (12.5 mg total) by mouth daily with breakfast. 02/20/16  Yes Deboraha Sprang, MD  Multiple Vitamin (MULTIVITAMIN WITH MINERALS) TABS Take 1 tablet by  mouth daily.   Yes Historical Provider, MD  neomycin-polymyxin b-dexamethasone (MAXITROL) 3.5-10000-0.1 OINT as directed. 0.25 inch to left eye at bedtime 09/06/16  Yes Historical Provider, MD  Omega-3 1000 MG CAPS Take 1,000 mg by mouth 2 (two) times daily.    Yes Historical Provider, MD  polyethylene glycol (MIRALAX / GLYCOLAX) packet Take 17 g by mouth daily.    Yes Historical Provider, MD  prednisoLONE acetate (PRED FORTE) 1 % ophthalmic suspension Place 1 drop into both eyes 3 (three) times daily.   Yes Historical Provider, MD  Probiotic Product ([place]) Take 1 capsule  by mouth daily.    Yes Historical Provider, MD  propafenone (RYTHMOL) 225 MG tablet Take 1 tablet (225 mg total) by mouth 2 (two) times daily. 08/19/16  Yes Deboraha Sprang, MD  ramipril (ALTACE) 5 MG capsule Take 1 capsule (5 mg total) by mouth daily. 02/20/16  Yes Deboraha Sprang, MD  sodium chloride (MURO 128) 2 % ophthalmic solution Place 1 drop into the right eye 3 (three) times daily.  04/19/16  Yes Historical Provider, MD  zinc gluconate 50 MG tablet Take 50 mg by mouth daily.   Yes Historical Provider, MD  amLODipine (NORVASC) 5 MG tablet TAKE 1 TABLET (5 MG TOTAL) BY MOUTH DAILY. Patient not taking: Reported on 01/03/2017 06/05/15   Deboraha Sprang, MD  moxifloxacin (VIGAMOX) 0.5 % ophthalmic solution Place 1 drop into both eyes 3 (three) times daily. Patient not taking: Reported on 07/24/2016 02/11/16   April Palumbo, MD     Social History   Social History  . Marital status: Married    Spouse name: N/A  . Number of children: N/A  . Years of education: N/A   Occupational History  . Retired Retired   Social History Main Topics  . Smoking status: Former Smoker    Types: Cigars    Quit date: 05/20/1995  . Smokeless tobacco: Current User    Types: Chew  . Alcohol use Yes     Comment: 6 beer week  . Drug use: No  . Sexual activity: Not on file   Other Topics Concern  . Not on file   Social History  Narrative   He lives at home with his wife. He is remarried. He lost his daughter at age of 67 to lung cancer. He does not smoke. Uses alcohol occasionally. Denies use of recreational drugs    Family Status  Relation Status  . Mother Deceased at age 21  . Father Deceased   Never knew his father  . Son Alive  . Daughter Deceased at age 18   lung cancer  . Maternal Grandmother Deceased  . Maternal Grandfather Deceased  . Paternal Grandmother Deceased  . Paternal Grandfather Deceased  . Neg Hx    Family History  Problem Relation Age of Onset  . Arthritis Son   . Heart Problems Son     Aortic valve replacement  . Lung cancer Daughter   . Heart attack Neg Hx   . Stroke Neg Hx   . Hypertension Neg Hx      ROS:  Full 14 point review of systems complete and found to be negative unless listed above.  Physical Exam: Blood pressure (!) 158/122, pulse 97, temperature 97.5 F (36.4 C), temperature source Oral, resp. rate (!) 22, height 5\' 8"  (1.727 m), weight 199 lb 11.2 oz (90.6 kg), SpO2 93 %.  General: Well developed, well nourished, male in no acute distress Head: Eyes PERRLA, No xanthomas.   Normocephalic and atraumatic, oropharynx without edema or exudate. Dentition: Poor Lungs: Bibasilar rales Heart: Heart irregular rate and rhythm with S1, S2, no significant  murmur. pulses are 2+ all 4 extrem.   Neck: No carotid bruits. No lymphadenopathy.  JVD 9 cm Abdomen: Bowel sounds present, abdomen soft and diffusely tender without masses or hernias noted. Abdomen is distended Msk:  No spine or cva tenderness. Generalized weakness, no joint deformities or effusions. Extremities: No clubbing or cyanosis. No edema.  Neuro: Alert and oriented X 3. No focal deficits noted. Psych:  Good affect, responds  appropriately Skin: No rashes or lesions noted.  Labs:   Lab Results  Component Value Date   WBC 12.3 (H) 01/03/2017   HGB 16.0 01/03/2017   HCT 47.0 01/03/2017   MCV 91.8 01/03/2017    PLT 223 01/03/2017     Recent Labs Lab 01/03/17 0311  NA 114*  K 4.4  CL 80*  CO2 24  BUN 18  CREATININE 0.96  CALCIUM 9.2  PROT 7.3  BILITOT 1.3*  ALKPHOS 82  ALT 54  AST 54*  GLUCOSE 149*  ALBUMIN 4.5    Recent Labs  01/03/17 0600  TROPONINI 0.06*    Recent Labs  01/03/17 0240  TROPIPOC 0.07   TSH  Date/Time Value Ref Range Status  01/03/2017 02:20 AM 2.141 0.350 - 4.500 uIU/mL Final    Echo: February 26, 2011  EF of 55% with normal cavity size, mild LVH.  Akinesis localized to the base of the inferolateral segment which was new from 2004.  ECG:  01/03/2017 Atrial fib, controlled ventricular response with heart rate 85 IVCD  LAD    Telemetry Atrial fibrillation was generally controlled ventricular response. Frequent PVCs and pairs, several triplets are seen. No longer runs.  Cath: 02/28/2011 Left ventricular angiography:  This showed low normal LV systolic function with an estimated ejection fraction of 50-55%. CORONARY ANGIOGRAPHY:  Left main coronary artery:  The vessel is normal in size with mild calcifications.  There is 10% disease distally close to the bifurcation. Left anterior descending artery:  The vessel is relatively small in size with mild calcifications.  There is 20% diffuse disease proximally as well as in the mid and the distal LAD without any other evidence of obstructive disease. Ramus coronary artery:  The vessel is normal in size and free of significant disease. Left circumflex artery:  The vessel is normal in size and free of significant disease.  It has minor calcifications. Right coronary artery:  The vessel overall is moderately-to-heavily calcified, especially in the proximal and mid segments.  Proximally, there is a calcified nodule, causing a hazy appearance with an estimated resulting stenosis of 50%.  In the mid segment, there is another discrete 40-50% stenosis.  The rest of the right coronary artery has minor  irregularities without any obstructive disease. STUDY CONCLUSIONS: 1. Mildly elevated filling pressures with only mild pulmonary     hypertension.  Normal cardiac output. 2. Low normal LV systolic function with an estimated ejection fraction     of 50-55%. 3. Moderate right coronary artery disease which was found to be non-     flow limiting by pressure wire interrogation.  FFR ratio was 0.86.     There is mild disease otherwise in the LAD. RECOMMENDATIONS:  Aggressive medical therapy is recommended.  No revascularization is advised.  Radiology:  Ct Abdomen Pelvis Wo Contrast Result Date: 01/03/2017 CLINICAL DATA:  Increasing weakness for 4 days. Abdominal pain, bloating, nausea, and constipation. White cell count 12.3. History of bladder and prostate cancer. Chronic renal disease. Unable to give IV contrast material. EXAM: CT ABDOMEN AND PELVIS WITHOUT CONTRAST TECHNIQUE: Multidetector CT imaging of the abdomen and pelvis was performed following the standard protocol without IV contrast. COMPARISON:  06/24/2014 FINDINGS: Lower chest: Small to moderate right pleural effusion with basilar infiltration or atelectasis. Focal infiltration in the left lung base. Cardiac enlargement. Coronary artery calcifications. Hepatobiliary: No focal liver lesions identified. Increased density within the gallbladder suggesting sludge. No wall thickening. No bile duct dilatation. Small amount of free  fluid around the liver suggesting ascites. Circumscribed soft tissue nodule inferior to the liver edge posteriorly. No change since prior study. Pancreas: Unremarkable. No pancreatic ductal dilatation or surrounding inflammatory changes. Spleen: Normal in size without focal abnormality. Adrenals/Urinary Tract: No adrenal gland nodules. Kidneys are symmetrical in size. No hydronephrosis or hydroureter. No renal, ureteral, or bladder stones. Bladder wall is not thickened. Stomach/Bowel: Stomach, small bowel, and colon are not  abnormally distended. No wall thickening is appreciated. Appendix is not identified. Vascular/Lymphatic: Aortic atherosclerosis. No enlarged abdominal or pelvic lymph nodes. Reproductive: Prostate seed implants are present. Other: No free air in the abdomen. Abdominal wall musculature appears intact. Musculoskeletal: Degenerative changes in the spine. No destructive bone lesions identified. IMPRESSION: Infiltration or consolidation in both lung bases with small right pleural effusion. Small amount of free fluid around the liver likely ascites. Sludge in the gallbladder. No evidence of bowel obstruction or inflammation. Aortic atherosclerosis.  Prostate seed implants. Electronically Signed   By: Lucienne Capers M.D.   On: 01/03/2017 05:10   Ct Head Wo Contrast Result Date: 01/03/2017 CLINICAL DATA:  Increasing weakness for 4 days. Dizziness. History of bladder cancer and prostate cancer. Hypertension. EXAM: CT HEAD WITHOUT CONTRAST TECHNIQUE: Contiguous axial images were obtained from the base of the skull through the vertex without intravenous contrast. COMPARISON:  02/11/2016 FINDINGS: Brain: Mild diffuse cerebral atrophy. Ventricular dilatation consistent with central atrophy. Low-attenuation changes in the deep white matter consistent with small vessel ischemia. No mass effect or midline shift. No abnormal extra-axial fluid collections. Gray-white matter junctions are distinct. Basal cisterns are not effaced. No acute intracranial hemorrhage. Vascular: Intracranial arterial vascular calcifications are present. Skull: No depressed skull fractures. Sinuses/Orbits: Mucosal thickening in the paranasal sinuses. No acute air-fluid levels. Mastoid air cells are not opacified. Other: No significant changes in the intracranial contents since previous study. IMPRESSION: No acute intracranial abnormalities. Mild chronic atrophy and small vessel ischemic changes. Electronically Signed   By: Lucienne Capers M.D.   On:  01/03/2017 02:59   Dg Abd Acute W/chest Result Date: 01/03/2017 CLINICAL DATA:  Nausea, vomiting, mid abdominal pain, and weakness for 48 hours. EXAM: DG ABDOMEN ACUTE W/ 1V CHEST COMPARISON:  CT abdomen and pelvis 06/24/2014 FINDINGS: Shallow inspiration. Infiltration or atelectasis in the right lung base. Cardiac enlargement without vascular congestion. No blunting of costophrenic angles. No pneumothorax. Scattered gas and stool throughout the colon without distention. Mildly distended gas-filled small bowel could represent early small bowel obstruction or enteritis. No abnormal air-fluid levels. No free air. No radiopaque stones. Degenerative changes in the spine and hips. IMPRESSION: Infiltration or atelectasis in the right lung base. Cardiac enlargement without vascular congestion. Mildly dilated gas-filled small bowel may indicate early small bowel obstruction or enteritis. No free air. Electronically Signed   By: Lucienne Capers M.D.   On: 01/03/2017 02:56    ASSESSMENT AND PLAN:   The patient was seen today by Dr. Harrington Challenger, the patient evaluated and the data reviewed.   1. Atrial fibrillation, controlled ventricular response - Duration is unknown. - He has been on propafenone for a long time because of his PVCs. - Continue this. He will need IV beta blocker if his oral medications are held.  2. Anticoagulation: - CHADS2VASC=4  (age x 2, HTN, CAD) - He would otherwise be a candidate for anticoagulation, but his fall risk and needs to be carefully assessed. - Additionally, the patient states he is not supposed to take NSAIDs and the reason for this is  not clear, but might also applied to anticoagulants, if it involves GI issues  3. Ventricular ectopy, salvos of nonsustained VT - Keep on telemetry - It is understandable that his ventricular ectopy has increased in the setting of acute illness - Check magnesium, but doubt it is low in the setting of a potassium of 4.4. - Continue  propafenone  4  HTN  BP is high  WIll need to follow with additoin of b blocker    5  HL  COntinue statin      Otherwise, per IM Principal Problem:   Hyponatremia Active Problems:   Ventricular bigeminy   Nonsustained ventricular tachycardia ([facility])   Nonobstructive CAD (coronary artery disease)   Hypertension   Hyperlipidemia   Constipation   Atrial fibrillation ([place])   SignedRosaria Ferries, PA-C 01/03/2017 9:08 AM Beeper [phone]  Co-Sign MD  Pt seen and examined  I agre with findings of R Barrett above  Pt is an 83 yo with history of moderate CAD and  NSVT  Followed by Olin Pia  Has been maintained on propafenone.  Seen in clinic earlier this winter He also has a history of vision problems  And, he has fallen a few times in the last few months  He was admitted now with N/V, abdominal distension and weakness.. Found to be in atrial fibrillation with controlled ventricular response.  The patient denies CP  No palpitations  Not very active  ON exam:  Pt laying flat in bed on his side.  JVP normal  LUngs are CTA  Cardiac exam  Irreg Irreg  No S3  No signif murmurs  Abdomen is distended, tympanic.  Nontender  Ext  Feet warm  2+ pulses  Nio edema .   EKG as noted above   1  Atrial fibrillation  Pt appears to be tolerating afib ok from hemodynamic standpoint.  Unfortunately I do not think he is a good candidate for anticoagulation with vision problems and fall history.   I have reviewed case with EP re afib and hx of NSVT and propafenone use   Would recomm stopping propafenone and following HR   Try low dose b blocker    Would continue telemetry  Check TSH if not done  Echo to reeval LVEF and atrial size  Does not have to be while inpt. Will continue to follow   2  Hx NSVT  Stop propafenone  Follow on telemetry  Low dose b blocker  3  Hx MOderate CAD  No symtpoms of angina.   4  Hyponatremia.  Management per primary team.   Dorris Carnes

## 2017-01-03 NOTE — ED Triage Notes (Signed)
Pt brought in by EMS from home with c/o general weakness that started 3 days ago  Pt has had intermittent nausea without vomiting, chills without fever, and night sweats

## 2017-01-03 NOTE — ED Notes (Signed)
Pt has a Na of 114 EDP Palumbo made aware.

## 2017-01-03 NOTE — Progress Notes (Signed)
Patient seen and examined at bedside, patient admitted after midnight, please see earlier detailed admission note by Dr. Blaine Hamper. Briefly, patient presented with weakness and found to have severe hyponatremia. On IV fluids. Troponin up with new onset atrial fibrillation. Called cardiology to evaluate. No chest pain.   Cordelia Poche, MD [facility] 01/03/2017, 1:27 PM Pager: [phone]

## 2017-01-03 NOTE — ED Notes (Signed)
Pt in radiology 

## 2017-01-03 NOTE — Progress Notes (Signed)
Discussed w/ Dr Silver Huguenin and pt had 2 BM's and has no C/O chest pain or any other pain

## 2017-01-03 NOTE — H&P (Signed)
History and Physical    [name] LZJ:[id number] DOB: 1927-06-03 DOA: 01/03/2017  Referring MD/NP/PA:   PCP: Tobie Lords D, FNP   Patient coming from:  The patient is coming from home.  At baseline, pt is independent for most of ADL.     Chief Complaint: Constipation, nausea, abdominal distention, generalized weakness  HPI: [name] is a 83 y.o. male with medical history significant of hypertension, hyperlipidemia, prostate cancer (s/p of brachytherapy 2003), CAD, bladder cancer (s/p of tumor resection), ventricular bigeminy, ventricular tachycardia, first degree AV block, who presents with constipation, nausea, abdominal distention and generalized weakness.  Patient states that he has been having constipation, nausea, abdominal distention and generalized weakness for 5 days. His last bowel movement was 3 days ago. He tried MiraLAX without help. He has mild abdominal pain, but no vomiting or diarrhea. Patient denies chest pain, cough, SOB, fever or chills. No symptoms of UTI or unilateral weakness.  ED Course: pt was found to have WBC 12.3, pending urinalysis, sodium 113, creatinine normal, temperature normal, no tachycardia, oxygen saturation 95% on room air, negative CT-head is negative for acute intracranial abnormalities. Pt is admitted to tele bed as inpt.  # CT abdomen/pelvis showed: Infiltration or consolidation in both lung bases with small right pleural effusion; small amount of free fluid around the liver likely ascites. Sludge in the gallbladder. No evidence of bowel obstruction or inflammation.Aortic atherosclerosis.  Prostate seed implants.  Review of Systems:   General: no fevers, chills, no changes in body weight, has poor appetite, has fatigue HEENT: no blurry vision, hearing changes or sore throat Respiratory: no dyspnea, coughing, wheezing CV: no chest pain, no palpitations GI: has nausea,  constipation, abdominal distention, abdominal pain, no diarrhea,  vomiting, GU: no dysuria, burning on urination, increased urinary frequency, hematuria  Ext: no leg edema Neuro: no unilateral weakness, numbness, or tingling, no vision change or hearing loss Skin: no rash, no skin tear. MSK: No muscle spasm, no deformity, no limitation of range of movement in spin Heme: No easy bruising.  Travel history: No recent long distant travel.  Allergy:  Allergies  Allergen Reactions  . Neosporin [Neomycin-Bacitracin Zn-Polymyx] Other (See Comments)    Makes his skin red   . Penicillins Hives and Itching  . Penicillin G Rash    Past Medical History:  Diagnosis Date  . Arthritis   . Bladder cancer ([place])   . CAD (coronary artery disease)    cath 02/28/11: dLM 10%, pLAD 20%, pRCA calcified nodule 50% (FFR 0.86 - not significant), mRCA 40-50%, mildly elevated filling pressures, EF 50-55%  . Cataract   . Chronic kidney disease   . Hard of hearing   . Hyperlipidemia   . Hypertension    LOV with EKG 06/11/11 Dr Caryl Comes  [facility]  . Prostate cancer ([place])    status post brachytherapy---in 2003  . Sleep apnea    STOP BANG SCORE 4  . Ventricular tachycardia, nonsustained/bigeminy    echo 6/12: EF 55%, basal inferolat AK, mild LVH    Past Surgical History:  Procedure Laterality Date  . APPENDECTOMY    . CARDIAC CATHETERIZATION  6/12  . CYSTOSCOPY  05/20/2012   Procedure: CYSTOSCOPY;  Surgeon: Alexis Frock, MD;  Location: [facility];  Service: Urology;  Laterality: N/A;  . CYSTOSCOPY  07/01/2012   Procedure: CYSTOSCOPY;  Surgeon: Molli Hazard, MD;  Location: [facility];  Service: Urology;  Laterality: N/A;  . EYE SURGERY     left cataract  extraction with IOL  . SKIN CANCER EXCISION     x 4  . TRANSURETHRAL RESECTION OF BLADDER TUMOR  05/20/2012   Procedure: TRANSURETHRAL RESECTION OF BLADDER TUMOR (TURBT);  Surgeon: Alexis Frock, MD;  Location: [facility];  Service: Urology;  Laterality: N/A;  [place]   . TRANSURETHRAL RESECTION OF BLADDER TUMOR  07/01/2012    Procedure: TRANSURETHRAL RESECTION OF BLADDER TUMOR (TURBT);  Surgeon: Molli Hazard, MD;  Location: [facility];  Service: Urology;  Laterality: N/A;  Cystoscopy, TURBT, be prepared for Open Cystorraphy        Social History:  reports that he quit smoking about 21 years ago. His smoking use included Cigars. His smokeless tobacco use includes Chew. He reports that he drinks alcohol. He reports that he does not use drugs.  Family History:  Family History  Problem Relation Age of Onset  . Arthritis Son   . Heart Problems Son     Aortic valve replacement  . Lung cancer Daughter   . Heart attack Neg Hx   . Stroke Neg Hx   . Hypertension Neg Hx      Prior to Admission medications   Medication Sig Start Date End Date Taking? Authorizing Provider  acetaminophen (TYLENOL) 500 MG tablet Take 500 mg by mouth every 6 (six) hours as needed (pain).     Historical Provider, MD  amLODipine (NORVASC) 5 MG tablet TAKE 1 TABLET (5 MG TOTAL) BY MOUTH DAILY. 06/05/15   Deboraha Sprang, MD  aspirin EC 81 MG tablet Take 81 mg by mouth.    Historical Provider, MD  atorvastatin (LIPITOR) 20 MG tablet Take 20 mg by mouth every evening.     Historical Provider, MD  dorzolamide-timolol (COSOPT) 22.3-6.8 MG/ML ophthalmic solution Place 1 drop into the right eye 2 (two) times daily. 04/19/16   Historical Provider, MD  DUREZOL 0.05 % EMUL 1 DROP IN RIGHT EYE THREE TIMES A DAY 10/06/15   Historical Provider, MD  hydrochlorothiazide (MICROZIDE) 12.5 MG capsule Take 1 capsule (12.5 mg total) by mouth daily with breakfast. 02/20/16   Deboraha Sprang, MD  moxifloxacin (VIGAMOX) 0.5 % ophthalmic solution Place 1 drop into both eyes 3 (three) times daily. Patient not taking: Reported on 07/24/2016 02/11/16   April Palumbo, MD  Multiple Vitamin (MULTIVITAMIN WITH MINERALS) TABS Take 1 tablet by mouth daily.    Historical Provider, MD  Omega-3 1000 MG CAPS Take 1,000 mg by mouth 2 (two) times daily.     Historical Provider, MD    Polyethyl Glycol-Propyl Glycol (SYSTANE OP) Apply 2 drops to eye 4 (four) times daily.    Historical Provider, MD  polyethylene glycol (MIRALAX / GLYCOLAX) packet Take 17 g by mouth.    Historical Provider, MD  Probiotic Product ([place]) Take 1 capsule by mouth daily.     Historical Provider, MD  propafenone (RYTHMOL) 225 MG tablet Take 1 tablet (225 mg total) by mouth 2 (two) times daily. 08/19/16   Deboraha Sprang, MD  ramipril (ALTACE) 5 MG capsule Take 1 capsule (5 mg total) by mouth daily. 02/20/16   Deboraha Sprang, MD  sodium chloride (MURO 128) 2 % ophthalmic solution 3 (three) times daily. 04/19/16   Historical Provider, MD  zinc gluconate 50 MG tablet Take 50 mg by mouth daily.    Historical Provider, MD    Physical Exam: Vitals:   01/03/17 0226 01/03/17 0340 01/03/17 0402 01/03/17 0559  BP: (!) 196/117  (!) 185/114 Marland Kitchen)  175/107  Pulse: 77  73   Resp: 20  (!) 25   Temp:   97.5 F (36.4 C)   TempSrc:   Oral   SpO2: 96% 95% 97%   Weight:      Height:       General: Not in acute distress HEENT:       Eyes: PERRL, EOMI, no scleral icterus.       ENT: No discharge from the ears and nose, no pharynx injection, no tonsillar enlargement.        Neck: No JVD, no bruit, no mass felt. Heme: No neck lymph node enlargement. Cardiac: S1/S2, Irregularly irregular rhythm, No murmurs, No gallops or rubs. Respiratory:  No rales, wheezing, rhonchi or rubs. GI: distended and has mild diffused tenderness, no rebound pain, no organomegaly, BS present. GU: No hematuria Ext: No pitting leg edema bilaterally. 2+DP/PT pulse bilaterally. Musculoskeletal: No joint deformities, No joint redness or warmth, no limitation of ROM in spin. Skin: No rashes.  Neuro: Alert, oriented X3, cranial nerves II-XII grossly intact, moves all extremities normally.  Psych: Patient is not psychotic, no suicidal or hemocidal ideation.  Labs on Admission: I have personally reviewed following labs and  imaging studies  CBC:  Recent Labs Lab 01/03/17 0220 01/03/17 0242  WBC 12.3*  --   NEUTROABS 9.2*  --   HGB 15.0 16.0  HCT 41.2 47.0  MCV 91.8  --   PLT 223  --    Basic Metabolic Panel:  Recent Labs Lab 01/03/17 0242 01/03/17 0311  NA 114* 114*  K 4.3 4.4  CL 79* 80*  CO2  --  24  GLUCOSE 142* 149*  BUN 19 18  CREATININE 0.80 0.96  CALCIUM  --  9.2   GFR: Estimated Creatinine Clearance: 52.4 mL/min (by C-G formula based on SCr of 0.96 mg/dL). Liver Function Tests:  Recent Labs Lab 01/03/17 0311  AST 54*  ALT 54  ALKPHOS 82  BILITOT 1.3*  PROT 7.3  ALBUMIN 4.5   No results for input(s): LIPASE, AMYLASE in the last 168 hours. No results for input(s): AMMONIA in the last 168 hours. Coagulation Profile: No results for input(s): INR, PROTIME in the last 168 hours. Cardiac Enzymes: No results for input(s): CKTOTAL, CKMB, CKMBINDEX, TROPONINI in the last 168 hours. BNP (last 3 results) No results for input(s): PROBNP in the last 8760 hours. HbA1C: No results for input(s): HGBA1C in the last 72 hours. CBG: No results for input(s): GLUCAP in the last 168 hours. Lipid Profile: No results for input(s): CHOL, HDL, LDLCALC, TRIG, CHOLHDL, LDLDIRECT in the last 72 hours. Thyroid Function Tests: No results for input(s): TSH, T4TOTAL, FREET4, T3FREE, THYROIDAB in the last 72 hours. Anemia Panel: No results for input(s): VITAMINB12, FOLATE, FERRITIN, TIBC, IRON, RETICCTPCT in the last 72 hours. Urine analysis: No results found for: COLORURINE, APPEARANCEUR, LABSPEC, PHURINE, GLUCOSEU, HGBUR, BILIRUBINUR, KETONESUR, PROTEINUR, UROBILINOGEN, NITRITE, LEUKOCYTESUR Sepsis Labs: @LABRCNTIP (procalcitonin:4,lacticidven:4) )No results found for this or any previous visit (from the past 240 hour(s)).   Radiological Exams on Admission: Ct Abdomen Pelvis Wo Contrast  Result Date: 01/03/2017 CLINICAL DATA:  Increasing weakness for 4 days. Abdominal pain, bloating, nausea,  and constipation. White cell count 12.3. History of bladder and prostate cancer. Chronic renal disease. Unable to give IV contrast material. EXAM: CT ABDOMEN AND PELVIS WITHOUT CONTRAST TECHNIQUE: Multidetector CT imaging of the abdomen and pelvis was performed following the standard protocol without IV contrast. COMPARISON:  06/24/2014 FINDINGS: Lower chest: Small to  moderate right pleural effusion with basilar infiltration or atelectasis. Focal infiltration in the left lung base. Cardiac enlargement. Coronary artery calcifications. Hepatobiliary: No focal liver lesions identified. Increased density within the gallbladder suggesting sludge. No wall thickening. No bile duct dilatation. Small amount of free fluid around the liver suggesting ascites. Circumscribed soft tissue nodule inferior to the liver edge posteriorly. No change since prior study. Pancreas: Unremarkable. No pancreatic ductal dilatation or surrounding inflammatory changes. Spleen: Normal in size without focal abnormality. Adrenals/Urinary Tract: No adrenal gland nodules. Kidneys are symmetrical in size. No hydronephrosis or hydroureter. No renal, ureteral, or bladder stones. Bladder wall is not thickened. Stomach/Bowel: Stomach, small bowel, and colon are not abnormally distended. No wall thickening is appreciated. Appendix is not identified. Vascular/Lymphatic: Aortic atherosclerosis. No enlarged abdominal or pelvic lymph nodes. Reproductive: Prostate seed implants are present. Other: No free air in the abdomen. Abdominal wall musculature appears intact. Musculoskeletal: Degenerative changes in the spine. No destructive bone lesions identified. IMPRESSION: Infiltration or consolidation in both lung bases with small right pleural effusion. Small amount of free fluid around the liver likely ascites. Sludge in the gallbladder. No evidence of bowel obstruction or inflammation. Aortic atherosclerosis.  Prostate seed implants. Electronically Signed   By:  Lucienne Capers M.D.   On: 01/03/2017 05:10   Ct Head Wo Contrast  Result Date: 01/03/2017 CLINICAL DATA:  Increasing weakness for 4 days. Dizziness. History of bladder cancer and prostate cancer. Hypertension. EXAM: CT HEAD WITHOUT CONTRAST TECHNIQUE: Contiguous axial images were obtained from the base of the skull through the vertex without intravenous contrast. COMPARISON:  02/11/2016 FINDINGS: Brain: Mild diffuse cerebral atrophy. Ventricular dilatation consistent with central atrophy. Low-attenuation changes in the deep white matter consistent with small vessel ischemia. No mass effect or midline shift. No abnormal extra-axial fluid collections. Gray-white matter junctions are distinct. Basal cisterns are not effaced. No acute intracranial hemorrhage. Vascular: Intracranial arterial vascular calcifications are present. Skull: No depressed skull fractures. Sinuses/Orbits: Mucosal thickening in the paranasal sinuses. No acute air-fluid levels. Mastoid air cells are not opacified. Other: No significant changes in the intracranial contents since previous study. IMPRESSION: No acute intracranial abnormalities. Mild chronic atrophy and small vessel ischemic changes. Electronically Signed   By: Lucienne Capers M.D.   On: 01/03/2017 02:59   Dg Abd Acute W/chest  Result Date: 01/03/2017 CLINICAL DATA:  Nausea, vomiting, mid abdominal pain, and weakness for 48 hours. EXAM: DG ABDOMEN ACUTE W/ 1V CHEST COMPARISON:  CT abdomen and pelvis 06/24/2014 FINDINGS: Shallow inspiration. Infiltration or atelectasis in the right lung base. Cardiac enlargement without vascular congestion. No blunting of costophrenic angles. No pneumothorax. Scattered gas and stool throughout the colon without distention. Mildly distended gas-filled small bowel could represent early small bowel obstruction or enteritis. No abnormal air-fluid levels. No free air. No radiopaque stones. Degenerative changes in the spine and hips. IMPRESSION:  Infiltration or atelectasis in the right lung base. Cardiac enlargement without vascular congestion. Mildly dilated gas-filled small bowel may indicate early small bowel obstruction or enteritis. No free air. Electronically Signed   By: Lucienne Capers M.D.   On: 01/03/2017 02:56     EKG: Independently reviewed.  Atrial fibrillation, QTC 477, widening QRS waves, LAD  Assessment/Plan Principal Problem:   Hyponatremia Active Problems:   Ventricular bigeminy   Nonsustained ventricular tachycardia (HCC)   Nonobstructive CAD (coronary artery disease)   Hypertension   Hyperlipidemia   Constipation   Atrial fibrillation (HCC)   Hyponatremia: Na 114. Mental status normal. Most  likely due to decreased oral intake and continuation of the HCTZ and ramipril.  -will admit to tele bed as inpt - Will check urine sodium, urine osmolality, serum osmolality. - check TSH - IVF: IV normal saline at 100 mL/h - check BMP q6h - hold HCTZ and ramipril  New Atrial Fibrillation: CHA2DS2-VASc Score is 4. Pt needs oral anticoagulation, but he dose not seem to be a good candidate for anticoagulants given old age and generalized deconditioning. This may need to discuss with Card. - cycle CE q6 x3  - Risk factor stratification: will check FLP, TSH and A1C  - 2d echo -continue home ASA  Constipation: -SMOG enema -MiraLAX and senna code  HTN: -hold HCTZ and ramipril -IV hydralazine when necessary -Start amlodipine  Hx of Ventricular bigeminy and Nonsustained ventricular tachycardia ([place]): pt was followed by by card, dr. Caryl Comes. He is on Propafenone -will continue Propafenone.  Nonobstructive CAD (coronary artery disease): no CP -continue ASA and lipitor  HLD: -lipitor   DVT ppx: SQ Lovenox Code Status: Full code Family Communication: None at bed side.  Disposition Plan:  Anticipate discharge back to previous home environment Consults called:  none Admission status: Obs / tele   Date of  Service 01/03/2017    Ivor Costa [facility] Pager [phone]  If 7PM-7AM, please contact night-coverage [email or web address] Password [email or web address] 01/03/2017, 6:38 AM

## 2017-01-03 NOTE — Evaluation (Signed)
Physical Therapy Evaluation Patient Details Name: [name] MRN: [id number] DOB: 1927-04-29 Today's Date: 01/03/2017   History of Present Illness   83 y.o. 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  Pt admitted for hyponatremia and new atrial fibrillation (cardiology following).  Clinical Impression  Pt admitted with above diagnosis. Pt currently with functional limitations due to the deficits listed below (see PT Problem List).  Pt will benefit from skilled PT to increase their independence and safety with mobility to allow discharge to the venue listed below.  Pt assisted with ambulating in room.  Pt reports weakness improved since admission although not yet at his baseline.       Follow Up Recommendations Home health PT;Supervision/Assistance - 24 hour    Equipment Recommendations  Rolling walker with 5" wheels    Recommendations for Other Services       Precautions / Restrictions Precautions Precautions: Fall Restrictions Weight Bearing Restrictions: No      Mobility  Bed Mobility               General bed mobility comments: OOB to BSC at start of session   Transfers Overall transfer level: Needs assistance Equipment used: Rolling walker (2 wheeled) Transfers: Sit to/from Stand Sit to Stand: Min guard         General transfer comment: verbal cues for hand placement, min/guard for safety  Ambulation/Gait Ambulation/Gait assistance: Min guard Ambulation Distance (Feet): 10 Feet Assistive device: Rolling walker (2 wheeled) Gait Pattern/deviations: Step-through pattern;Trunk flexed;Decreased stride length     General Gait Details: verbal cues for safe use of RW, posture, distance limited to in room only per pt request due to feeling weak (however reports  improved since admission), SpO2 94% room air although pt sounded wheezy upon sitting in recliner, denies SOB just states "difficult to take deep breath due to tight abdomen"  Stairs            Wheelchair Mobility    Modified Rankin (Stroke Patients Only)       Balance Overall balance assessment: Needs assistance Sitting-balance support: Feet supported;Bilateral upper extremity supported Sitting balance-Leahy Scale: Good     Standing balance support: Bilateral upper extremity supported Standing balance-Leahy Scale: Poor Standing balance comment: requires UE support                              Pertinent Vitals/Pain Pain Assessment: No/denies pain  BP 105/77 mmHg presession, no dizziness during mobility HR 72 bpm end of ambulating    Home Living Family/patient expects to be discharged to:: Private residence Living Arrangements: Spouse/significant other Available Help at Discharge: Available PRN/intermittently   Home Access: Stairs to enter (garage)   Technical brewer of Steps: 2 Home Layout: One level Home Equipment: None      Prior Function Level of Independence: Independent               Hand Dominance        Extremity/Trunk Assessment   Upper Extremity Assessment Upper Extremity Assessment: Overall WFL for tasks assessed    Lower Extremity Assessment Lower Extremity Assessment: Generalized weakness       Communication   Communication: HOH  Cognition Arousal/Alertness: Awake/alert Behavior During Therapy: WFL for tasks assessed/performed Overall Cognitive Status: Within Functional Limits  for tasks assessed                                        General Comments      Exercises     Assessment/Plan    PT Assessment Patient needs continued PT services  PT Problem List Decreased strength;Decreased activity tolerance;Decreased mobility;Decreased balance;Decreased knowledge of use of DME       PT  Treatment Interventions DME instruction;Gait training;Balance training;Therapeutic exercise;Functional mobility training;Therapeutic activities    PT Goals (Current goals can be found in the Care Plan section)  Acute Rehab PT Goals Patient Stated Goal: to be more independent PT Goal Formulation: With patient Time For Goal Achievement: 01/10/17 Potential to Achieve Goals: Good    Frequency Min 3X/week   Barriers to discharge        Co-evaluation PT/OT/SLP Co-Evaluation/Treatment: Yes Reason for Co-Treatment: For patient/therapist safety PT goals addressed during session: Mobility/safety with mobility OT goals addressed during session: ADL's and self-care       End of Session Equipment Utilized During Treatment: Gait belt Activity Tolerance: Patient tolerated treatment well Patient left: in chair;with call bell/phone within reach;with chair alarm set Nurse Communication: Mobility status PT Visit Diagnosis: Muscle weakness (generalized) (M62.81);Difficulty in walking, not elsewhere classified (R26.2)    Time: 6546-5035 PT Time Calculation (min) (ACUTE ONLY): 19 min   Charges:   PT Evaluation $PT Eval Low Complexity: 1 Procedure     PT G Codes:        Carmelia Bake, PT, DPT 01/03/2017 Pager: [phone]  York Ram E 01/03/2017, 3:42 PM

## 2017-01-03 NOTE — Progress Notes (Signed)
Notified Dr Teryl Lucy R/t critical labs

## 2017-01-03 NOTE — Progress Notes (Signed)
  Echocardiogram 2D Echocardiogram has been performed.  Tresa Res 01/03/2017, 11:21 AM

## 2017-01-04 ENCOUNTER — Other Ambulatory Visit (HOSPITAL_COMMUNITY): Payer: 59

## 2017-01-04 DIAGNOSIS — I472 Ventricular tachycardia: Secondary | ICD-10-CM

## 2017-01-04 DIAGNOSIS — E785 Hyperlipidemia, unspecified: Secondary | ICD-10-CM

## 2017-01-04 DIAGNOSIS — K56609 Unspecified intestinal obstruction, unspecified as to partial versus complete obstruction: Secondary | ICD-10-CM

## 2017-01-04 DIAGNOSIS — I10 Essential (primary) hypertension: Secondary | ICD-10-CM

## 2017-01-04 LAB — CBC
HCT: 38.7 % — ABNORMAL LOW (ref 39.0–52.0)
Hemoglobin: 13.7 g/dL (ref 13.0–17.0)
MCH: 33.7 pg (ref 26.0–34.0)
MCHC: 35.4 g/dL (ref 30.0–36.0)
MCV: 95.1 fL (ref 78.0–100.0)
Platelets: 211 10*3/uL (ref 150–400)
RBC: 4.07 MIL/uL — ABNORMAL LOW (ref 4.22–5.81)
RDW: 13.3 % (ref 11.5–15.5)
WBC: 12.4 10*3/uL — ABNORMAL HIGH (ref 4.0–10.5)

## 2017-01-04 LAB — BASIC METABOLIC PANEL
ANION GAP: 7 (ref 5–15)
Anion gap: 8 (ref 5–15)
Anion gap: 6 (ref 5–15)
Anion gap: 5 (ref 5–15)
BUN: 20 mg/dL (ref 6–20)
BUN: 20 mg/dL (ref 6–20)
BUN: 22 mg/dL — AB (ref 6–20)
BUN: 21 mg/dL — AB (ref 6–20)
CALCIUM: 8.5 mg/dL — AB (ref 8.9–10.3)
CALCIUM: 8.3 mg/dL — AB (ref 8.9–10.3)
CALCIUM: 8.2 mg/dL — AB (ref 8.9–10.3)
CO2: 24 mmol/L (ref 22–32)
CO2: 25 mmol/L (ref 22–32)
CO2: 24 mmol/L (ref 22–32)
CO2: 23 mmol/L (ref 22–32)
CREATININE: 0.87 mg/dL (ref 0.61–1.24)
Calcium: 8.1 mg/dL — ABNORMAL LOW (ref 8.9–10.3)
Chloride: 85 mmol/L — ABNORMAL LOW (ref 101–111)
Chloride: 87 mmol/L — ABNORMAL LOW (ref 101–111)
Chloride: 89 mmol/L — ABNORMAL LOW (ref 101–111)
Chloride: 92 mmol/L — ABNORMAL LOW (ref 101–111)
Creatinine, Ser: 0.92 mg/dL (ref 0.61–1.24)
Creatinine, Ser: 0.88 mg/dL (ref 0.61–1.24)
Creatinine, Ser: 0.98 mg/dL (ref 0.61–1.24)
GFR calc Af Amer: >60 mL/min (ref >60)
GFR calc Af Amer: >60 mL/min (ref >60)
GFR calc Af Amer: >60 mL/min (ref >60)
GFR calc Af Amer: >60 mL/min (ref >60)
GFR calc non Af Amer: >60 mL/min (ref >60)
Glucose, Bld: 119 mg/dL — ABNORMAL HIGH (ref 65–99)
Glucose, Bld: 135 mg/dL — ABNORMAL HIGH (ref 65–99)
Glucose, Bld: 118 mg/dL — ABNORMAL HIGH (ref 65–99)
Glucose, Bld: 118 mg/dL — ABNORMAL HIGH (ref 65–99)
POTASSIUM: 4.3 mmol/L (ref 3.5–5.1)
POTASSIUM: 4.6 mmol/L (ref 3.5–5.1)
Potassium: 4.4 mmol/L (ref 3.5–5.1)
Potassium: 4.3 mmol/L (ref 3.5–5.1)
SODIUM: 119 mmol/L — AB (ref 135–145)
SODIUM: 119 mmol/L — AB (ref 135–145)
SODIUM: 120 mmol/L — AB (ref 135–145)
Sodium: 117 mmol/L — CL (ref 135–145)

## 2017-01-04 LAB — OSMOLALITY, URINE: Osmolality, Ur: 775 mOsm/kg (ref 300–900)

## 2017-01-04 LAB — LIPID PANEL
CHOL/HDL RATIO: 2.1 ratio
Cholesterol: 95 mg/dL (ref 0–200)
HDL: 45 mg/dL (ref >40)
LDL Cholesterol: 41 mg/dL (ref 0–99)
Triglycerides: 45 mg/dL (ref <150)
VLDL: 9 mg/dL (ref 0–40)

## 2017-01-04 LAB — SODIUM, URINE, RANDOM

## 2017-01-04 LAB — CREATININE, URINE, RANDOM: CREATININE, URINE: 180.63 mg/dL

## 2017-01-04 LAB — GLUCOSE, CAPILLARY: GLUCOSE-CAPILLARY: 85 mg/dL (ref 65–99)

## 2017-01-04 NOTE — Progress Notes (Signed)
Spoke w/ Dr.Nettey acknowledged NA=119 ok not to call critical NA level unless it is declining

## 2017-01-04 NOTE — Progress Notes (Signed)
CRITICAL VALUE ALERT  Critical value received:  Na 119  Date of notification:  01/04/17  Time of notification:   0211  Critical value read back: yes Crystal.F  Nurse who received alert:  Felicity Coyer   MD notified (1st page):  Lonny Prude, R  Time of first page:  1047  MD notified (2nd page):  Time of second page:  Responding MD:    Time MD responded:

## 2017-01-04 NOTE — Progress Notes (Signed)
CRITICAL VALUE ALERT  Critical value received:  Na 117  Date of notification: 01/04/17   Time of notification:  0200  Critical value read back:Yes.    Nurse who received alert:  Thana Farr  MD notified (1st page):  Walden Field  Time of first page:  [phone]  MD notified (2nd page):  Time of second page:  Responding MD:  Walden Field  Time MD responded:  [phone]

## 2017-01-04 NOTE — Progress Notes (Signed)
Remains in atrial fibrillation with CVR off propafenone.  No new recs.  Will see on Monday.  Please call with any questions over the weekend.

## 2017-01-04 NOTE — Progress Notes (Signed)
PROGRESS NOTE    UNNAMED ZEIEN  OZH:[id number] DOB: 1927/01/11 DOA: 01/03/2017 PCP: Tobie Lords D, FNP   Brief Narrative: [name] is a 83 y.o. male with medical history significant of hypertension, hyperlipidemia, prostate cancer (s/p of brachytherapy 2003), CAD, bladder cancer (s/p of tumor resection), ventricular bigeminy, ventricular tachycardia, first degree AV block. He was found to have severe hyponatremia and new onset atrial fibrillation.   Assessment & Plan:   Principal Problem:   Hyponatremia Active Problems:   Ventricular bigeminy   Nonsustained ventricular tachycardia (HCC)   Nonobstructive CAD (coronary artery disease)   Hypertension   Hyperlipidemia   Constipation   Atrial fibrillation (HCC)   Hyponatremia Severe. Improving. -continue normal saline. Goal is 8-10 meq per 24 hours -repeat BMP q6 hours -discontinue hydrochlorothiazide at discharge -repeat urine osmolality, sodium and creatinine  Atrial fibrillation New. CHA2DS2-VASc Score is 4. Cardiology recommending no anticoagulation -cardiology recommendations -telemetry  Constipation Improved with enema  Hypertension -continue amlodipine -continue hydralazine prn  Ventricular bigeminy Nonsustained V-tach -cardiology recommendations: discontinue propafenone secondary to atrial fibrillation -telemetry  Nonobstructive CAD -continue aspirin -continue Lipitor   Hyperlipidemia -continue liptor   DVT prophylaxis: Lovenox Code Status: Full code Family Communication: None at bedside Disposition Plan: Discharge home pending improvement in hyponatremia   Consultants:   Cardiology  Procedures:   None  Antimicrobials:  None    Subjective: Patient reports feeling much better today. No nausea or vomiting. No abdominal pain.  Objective: Vitals:   01/03/17 2115 01/03/17 2311 01/04/17 0545 01/04/17 1001  BP: 93/63 90/77 96/78  115/79  Pulse: 60 67 67 (!) 114  Resp:   18     Temp: 98.1 F (36.7 C)  98.7 F (37.1 C)   TempSrc:   Oral   SpO2:   98%   Weight:      Height:        Intake/Output Summary (Last 24 hours) at 01/04/17 1052 Last data filed at 01/04/17 0900  Gross per 24 hour  Intake          2824.58 ml  Output              650 ml  Net          2174.58 ml   Filed Weights   01/03/17 0222 01/03/17 0630  Weight: 81.6 kg (180 lb) 90.6 kg (199 lb 11.2 oz)    Examination:  General exam: Appears calm and comfortable Respiratory system: Clear to auscultation. Respiratory effort normal. Cardiovascular system: S1 & S2 heard, irregular rhythm, normal rate. No murmurs. Gastrointestinal system: Abdomen is slightly distended, soft and nontender. Normal bowel sounds heard. Central nervous system: Alert and oriented. No focal neurological deficits. Extremities: No edema. No calf tenderness Skin: No cyanosis. No rashes Psychiatry: Judgement and insight appear normal. Mood & affect appropriate.     Data Reviewed: I have personally reviewed following labs and imaging studies  CBC:  Recent Labs Lab 01/03/17 0220 01/03/17 0242 01/04/17 0117  WBC 12.3*  --  12.4*  NEUTROABS 9.2*  --   --   HGB 15.0 16.0 13.7  HCT 41.2 47.0 38.7*  MCV 91.8  --  95.1  PLT 223  --  629   Basic Metabolic Panel:  Recent Labs Lab 01/03/17 1055 01/03/17 1413 01/03/17 2059 01/04/17 0117 01/04/17 0958  NA 116* 116* 117* 117* 119*  K 3.7 4.0 4.0 4.4 4.3  CL 84* 84* 85* 85* 87*  CO2 23 23 23 24 25   GLUCOSE  114* 117* 128* 119* 135*  BUN 15 17 16 20 20   CREATININE 0.74 0.85 0.80 0.92 0.88  CALCIUM 8.4* 8.3* 8.3* 8.1* 8.5*  MG 1.8  --   --   --   --    GFR: Estimated Creatinine Clearance: 64.7 mL/min (by C-G formula based on SCr of 0.88 mg/dL). Liver Function Tests:  Recent Labs Lab 01/03/17 0311  AST 54*  ALT 54  ALKPHOS 82  BILITOT 1.3*  PROT 7.3  ALBUMIN 4.5   No results for input(s): LIPASE, AMYLASE in the last 168 hours. No results for  input(s): AMMONIA in the last 168 hours. Coagulation Profile: No results for input(s): INR, PROTIME in the last 168 hours. Cardiac Enzymes:  Recent Labs Lab 01/03/17 0600 01/03/17 1055 01/03/17 1740  TROPONINI 0.06* 0.08* 0.08*   BNP (last 3 results) No results for input(s): PROBNP in the last 8760 hours. HbA1C: No results for input(s): HGBA1C in the last 72 hours. CBG:  Recent Labs Lab 01/03/17 0752 01/04/17 0737  GLUCAP 131* 85   Lipid Profile:  Recent Labs  01/04/17 0117  CHOL 95  HDL 45  LDLCALC 41  TRIG 45  CHOLHDL 2.1   Thyroid Function Tests:  Recent Labs  01/03/17 0220  TSH 2.141   Anemia Panel: No results for input(s): VITAMINB12, FOLATE, FERRITIN, TIBC, IRON, RETICCTPCT in the last 72 hours. Sepsis Labs: No results for input(s): PROCALCITON, LATICACIDVEN in the last 168 hours.  No results found for this or any previous visit (from the past 240 hour(s)).       Radiology Studies: Ct Abdomen Pelvis Wo Contrast  Result Date: 01/03/2017 CLINICAL DATA:  Increasing weakness for 4 days. Abdominal pain, bloating, nausea, and constipation. White cell count 12.3. History of bladder and prostate cancer. Chronic renal disease. Unable to give IV contrast material. EXAM: CT ABDOMEN AND PELVIS WITHOUT CONTRAST TECHNIQUE: Multidetector CT imaging of the abdomen and pelvis was performed following the standard protocol without IV contrast. COMPARISON:  06/24/2014 FINDINGS: Lower chest: Small to moderate right pleural effusion with basilar infiltration or atelectasis. Focal infiltration in the left lung base. Cardiac enlargement. Coronary artery calcifications. Hepatobiliary: No focal liver lesions identified. Increased density within the gallbladder suggesting sludge. No wall thickening. No bile duct dilatation. Small amount of free fluid around the liver suggesting ascites. Circumscribed soft tissue nodule inferior to the liver edge posteriorly. No change since prior  study. Pancreas: Unremarkable. No pancreatic ductal dilatation or surrounding inflammatory changes. Spleen: Normal in size without focal abnormality. Adrenals/Urinary Tract: No adrenal gland nodules. Kidneys are symmetrical in size. No hydronephrosis or hydroureter. No renal, ureteral, or bladder stones. Bladder wall is not thickened. Stomach/Bowel: Stomach, small bowel, and colon are not abnormally distended. No wall thickening is appreciated. Appendix is not identified. Vascular/Lymphatic: Aortic atherosclerosis. No enlarged abdominal or pelvic lymph nodes. Reproductive: Prostate seed implants are present. Other: No free air in the abdomen. Abdominal wall musculature appears intact. Musculoskeletal: Degenerative changes in the spine. No destructive bone lesions identified. IMPRESSION: Infiltration or consolidation in both lung bases with small right pleural effusion. Small amount of free fluid around the liver likely ascites. Sludge in the gallbladder. No evidence of bowel obstruction or inflammation. Aortic atherosclerosis.  Prostate seed implants. Electronically Signed   By: Lucienne Capers M.D.   On: 01/03/2017 05:10   Ct Head Wo Contrast  Result Date: 01/03/2017 CLINICAL DATA:  Increasing weakness for 4 days. Dizziness. History of bladder cancer and prostate cancer. Hypertension. EXAM: CT HEAD  WITHOUT CONTRAST TECHNIQUE: Contiguous axial images were obtained from the base of the skull through the vertex without intravenous contrast. COMPARISON:  02/11/2016 FINDINGS: Brain: Mild diffuse cerebral atrophy. Ventricular dilatation consistent with central atrophy. Low-attenuation changes in the deep white matter consistent with small vessel ischemia. No mass effect or midline shift. No abnormal extra-axial fluid collections. Gray-white matter junctions are distinct. Basal cisterns are not effaced. No acute intracranial hemorrhage. Vascular: Intracranial arterial vascular calcifications are present. Skull: No  depressed skull fractures. Sinuses/Orbits: Mucosal thickening in the paranasal sinuses. No acute air-fluid levels. Mastoid air cells are not opacified. Other: No significant changes in the intracranial contents since previous study. IMPRESSION: No acute intracranial abnormalities. Mild chronic atrophy and small vessel ischemic changes. Electronically Signed   By: Lucienne Capers M.D.   On: 01/03/2017 02:59   Dg Abd Acute W/chest  Result Date: 01/03/2017 CLINICAL DATA:  Nausea, vomiting, mid abdominal pain, and weakness for 48 hours. EXAM: DG ABDOMEN ACUTE W/ 1V CHEST COMPARISON:  CT abdomen and pelvis 06/24/2014 FINDINGS: Shallow inspiration. Infiltration or atelectasis in the right lung base. Cardiac enlargement without vascular congestion. No blunting of costophrenic angles. No pneumothorax. Scattered gas and stool throughout the colon without distention. Mildly distended gas-filled small bowel could represent early small bowel obstruction or enteritis. No abnormal air-fluid levels. No free air. No radiopaque stones. Degenerative changes in the spine and hips. IMPRESSION: Infiltration or atelectasis in the right lung base. Cardiac enlargement without vascular congestion. Mildly dilated gas-filled small bowel may indicate early small bowel obstruction or enteritis. No free air. Electronically Signed   By: Lucienne Capers M.D.   On: 01/03/2017 02:56        Scheduled Meds: . acidophilus   Oral Daily  . aspirin EC  81 mg Oral Daily  . atorvastatin  20 mg Oral QPM  . dorzolamide-timolol  1 drop Right Eye BID  . enoxaparin (LOVENOX) injection  40 mg Subcutaneous Q24H  . metoprolol tartrate  25 mg Oral BID  . multivitamin with minerals  1 tablet Oral Daily  . neomycin-polymyxin b-dexamethasone   Left Eye QHS  . omega-3 acid ethyl esters  1,000 mg Oral BID  . polyethylene glycol  17 g Oral BID  . prednisoLONE acetate  1 drop Both Eyes TID  . senna-docusate  1 tablet Oral BID  . sodium chloride  1  drop Right Eye TID  . sodium chloride flush  3 mL Intravenous Q12H  . zinc sulfate  220 mg Oral Daily   Continuous Infusions: . sodium chloride 125 mL/hr at 01/04/17 1001     LOS: 1 day     Cordelia Poche, MD [facility] 01/04/2017, 10:52 AM Pager: [phone]  If 7PM-7AM, please contact night-coverage [email or web address] Password [facility] 01/04/2017, 10:52 AM

## 2017-01-05 LAB — BASIC METABOLIC PANEL
ANION GAP: 6 (ref 5–15)
ANION GAP: 5 (ref 5–15)
ANION GAP: 6 (ref 5–15)
BUN: 21 mg/dL — ABNORMAL HIGH (ref 6–20)
BUN: 20 mg/dL (ref 6–20)
BUN: 23 mg/dL — ABNORMAL HIGH (ref 6–20)
CALCIUM: 8.3 mg/dL — AB (ref 8.9–10.3)
CALCIUM: 8.4 mg/dL — AB (ref 8.9–10.3)
CALCIUM: 8.4 mg/dL — AB (ref 8.9–10.3)
CHLORIDE: 90 mmol/L — AB (ref 101–111)
CHLORIDE: 91 mmol/L — AB (ref 101–111)
CO2: 24 mmol/L (ref 22–32)
CO2: 25 mmol/L (ref 22–32)
CO2: 23 mmol/L (ref 22–32)
Chloride: 91 mmol/L — ABNORMAL LOW (ref 101–111)
Creatinine, Ser: 0.91 mg/dL (ref 0.61–1.24)
Creatinine, Ser: 0.87 mg/dL (ref 0.61–1.24)
Creatinine, Ser: 0.9 mg/dL (ref 0.61–1.24)
GFR calc non Af Amer: >60 mL/min (ref >60)
GFR calc non Af Amer: >60 mL/min (ref >60)
Glucose, Bld: 110 mg/dL — ABNORMAL HIGH (ref 65–99)
Glucose, Bld: 103 mg/dL — ABNORMAL HIGH (ref 65–99)
Glucose, Bld: 118 mg/dL — ABNORMAL HIGH (ref 65–99)
POTASSIUM: 4.4 mmol/L (ref 3.5–5.1)
Potassium: 4.3 mmol/L (ref 3.5–5.1)
Potassium: 4.4 mmol/L (ref 3.5–5.1)
Sodium: 120 mmol/L — ABNORMAL LOW (ref 135–145)
Sodium: 121 mmol/L — ABNORMAL LOW (ref 135–145)
Sodium: 120 mmol/L — ABNORMAL LOW (ref 135–145)

## 2017-01-05 LAB — GLUCOSE, CAPILLARY: Glucose-Capillary: 94 mg/dL (ref 65–99)

## 2017-01-05 MED ORDER — TOLVAPTAN 15 MG PO TABS
15.0000 mg | ORAL_TABLET | ORAL | Status: DC
  Administered 2017-01-05: 15 mg via ORAL
  Filled 2017-01-05: qty 1

## 2017-01-05 NOTE — Progress Notes (Signed)
Occupational Therapy Treatment Patient Details Name: [name] MRN: [id number] DOB: 04/03/1927 Today's Date: 01/05/2017    History of present illness  83 y.o. 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  Pt admitted for hyponatremia and new atrial fibrillation (cardiology following).   OT comments  Focus of session on ADL and functional mobility. Pt required mod A with LB ADL and min A with mobility @ RW level. Pt only able to ambulate @ 40 feet before saying "I'm so weak, like my legs can't carry me". Pt complaining of nausea during session. 2/4 dyspnea. HR fluctuated from low 50s to 80s. BP and O2 stable. Feel pt would benefit form short term rehab prior to DC home. Son present during session and states that this is not "anywhere close to his baseline". Son states pt will not have 24/7 S after DC as family members work during the day. Will continue to follow acutely to address established goals and facilitate safe DC to next venue of care.   Follow Up Recommendations  SNF;Supervision/Assistance - 24 hour    Equipment Recommendations  3 in 1 bedside commode    Recommendations for Other Services      Precautions / Restrictions Precautions Precautions: Fall Precaution Comments: watch HR       Mobility Bed Mobility Overal bed mobility: Needs Assistance Bed Mobility: Supine to Sit     Supine to sit: Supervision        Transfers Overall transfer level: Needs assistance Equipment used: Rolling walker (2 wheeled) Transfers: Sit to/from Stand Sit to Stand: Min guard         General transfer comment: vc for hand placement and safety    Balance Overall balance assessment: Needs assistance   Sitting balance-Leahy Scale: Good       Standing balance-Leahy Scale:  Poor Standing balance comment: requires UE support   "I feel like I'm going to fall"                           ADL either performed or assessed with clinical judgement   ADL Overall ADL's : Needs assistance/impaired     Grooming: Wash/dry hands;Set up;Sitting   Upper Body Bathing: Set up;Supervision/ safety   Lower Body Bathing: Sit to/from stand;Moderate assistance   Upper Body Dressing : Min guard   Lower Body Dressing: Moderate assistance   Toilet Transfer: Minimal assistance;Regular Materials engineer Details (indicate cue type and reason): pulling up using grab bar Toileting- Clothing Manipulation and Hygiene: Minimal assistance;Sit to/from stand Toileting - Clothing Manipulation Details (indicate cue type and reason): assist for hygiene after BM     Functional mobility during ADLs: Minimal assistance;Rolling walker (only able to ambulate @ 40 feet) General ADL Comments: Fatigues easily during ADL. Ambulated in hall after toilet transfer/ADL. Only able to walk @ 40 ft before saying "I feel so weak like my legs can't carry me".     Vision   Additional Comments: vision impaired at baseline. Low vision  Wears glasses   Perception     Praxis      Cognition Arousal/Alertness: Awake/alert Behavior During Therapy: WFL for tasks assessed/performed Overall Cognitive Status: Within Functional Limits for tasks assessed  Exercises     Shoulder Instructions       General Comments      Pertinent Vitals/ Pain       Pain Assessment: No/denies pain        HR 50s-80s (unsure of accuracy)  Home Living                                          Prior Functioning/Environment              Frequency  Min 2X/week        Progress Toward Goals  OT Goals(current goals can now be found in the care plan section)  Progress towards OT goals: Progressing toward goals  Acute  Rehab OT Goals Patient Stated Goal: to be more independent OT Goal Formulation: With patient Time For Goal Achievement: 01/10/17 Potential to Achieve Goals: Good ADL Goals Pt Will Perform Grooming: standing;with supervision Pt Will Perform Lower Body Dressing: with mod assist;with adaptive equipment;sit to/from stand Pt Will Transfer to Toilet: with supervision;ambulating;bedside commode Pt Will Perform Toileting - Clothing Manipulation and hygiene: with min guard assist;sit to/from stand  Plan Discharge plan needs to be updated;Frequency needs to be updated    Co-evaluation                 End of Session Equipment Utilized During Treatment: Gait belt;Rolling walker  OT Visit Diagnosis: Unsteadiness on feet (R26.81);Muscle weakness (generalized) (M62.81)   Activity Tolerance Patient tolerated treatment well   Patient Left in chair;with call bell/phone within reach;with chair alarm set;with family/visitor present   Nurse Communication Mobility status;Other (comment) (complaints of nausea)        Time: 1330-1402 OT Time Calculation (min): 32 min  Charges: OT General Charges $OT Visit: 1 Procedure OT Treatments $Self Care/Home Management : 23-37 mins  [facility], OT/L  [phone] 01/05/2017   Donnie Panik,HILLARY 01/05/2017, 2:10 PM

## 2017-01-05 NOTE — Consult Note (Signed)
Renal Service Consult Note [facility]  [name] 01/05/2017 [name] Requesting Physician:  Dr [name]  Reason for Consult:  Hyponatremia HPI: The patient is a 83 y.o. year-old with history of prostate Ca (Rx '03 brachyRx), HTN, HL, HOH, CKD, CAD, bladder cancer, DJD , NSVT who presented to ED on  4/20 with global weakness, onset x 5 days.  No v/d, abd pain, no fevers or cough/ CP.  No recent CVA or trauma.  Legs giving out, dizzy, some constipation, abd bloating and nausea.  Labs showed low Na+ of 114, creat normal.  Mental status was normal.  HypoNa+ felt to be due to HCTZ and ramipril.  Pt was admitted, held the HCTZ/ acei and started NS at 100/hr.  Had new afib but poor candidate for anticoag with mult comorbidities and old age.  He wa scontinued on propafenone for hx NSVT/ vent bigeminy.  For HTN norvasc was started.   Since admission , serum Na has irmpoved gradually to 120, however, since last pm at 8pm the Na+ level has been stuck at 120.  We are asked to see for hyponatremia.    Home meds > asa, lipitor, HCTZ, MVI, miralax, propafenone, ramipril, norvasc, eyedrops  Current meds > asa, lipitor, lovenox, lopressor, MVI, miralax, NS at 125 cc/hr.    BP's on admission were very high at 170/110 range, then on 4/20 and 4/21 BP's dropped into the 90's and low 100's and stayed there, then today the BP's have improved up to 027'X systolic.  Norvasc was dc'd yesterday, MTP was held last pm.       ROS  denies CP  no joint pain   no HA  no blurry vision  no rash  no diarrhea  no nausea/ vomiting  no dysuria  no difficulty voiding  no change in urine color    Past Medical History  Past Medical History:  Diagnosis Date  . Arthritis   . Bladder cancer ([place])   . CAD (coronary artery disease)    cath 02/28/11: dLM 10%, pLAD 20%, pRCA calcified nodule 50% (FFR 0.86 - not significant), mRCA 40-50%, mildly elevated filling pressures, EF 50-55%  . Cataract   .  Chronic kidney disease   . Hard of hearing   . Hyperlipidemia   . Hypertension    LOV with EKG 06/11/11 Dr [name]  [name]  . Prostate cancer ([place])    status post brachytherapy---in 2003  . Sleep apnea    STOP BANG SCORE 4  . Ventricular tachycardia, nonsustained/bigeminy    echo 6/12: EF 55%, basal inferolat AK, mild LVH   Past Surgical History  Past Surgical History:  Procedure Laterality Date  . APPENDECTOMY    . CARDIAC CATHETERIZATION  6/12  . CYSTOSCOPY  05/20/2012   Procedure: CYSTOSCOPY;  Surgeon: Christopher Frock, MD;  Location: [facility];  Service: Urology;  Laterality: N/A;  . CYSTOSCOPY  07/01/2012   Procedure: CYSTOSCOPY;  Surgeon: Christopher Hazard, MD;  Location: [facility];  Service: Urology;  Laterality: N/A;  . EYE SURGERY     left cataract extraction with IOL  . SKIN CANCER EXCISION     x 4  . TRANSURETHRAL RESECTION OF BLADDER TUMOR  05/20/2012   Procedure: TRANSURETHRAL RESECTION OF BLADDER TUMOR (TURBT);  Surgeon: Christopher Frock, MD;  Location: [facility];  Service: Urology;  Laterality: N/A;  [place]   . TRANSURETHRAL RESECTION OF BLADDER TUMOR  07/01/2012   Procedure: TRANSURETHRAL RESECTION OF BLADDER TUMOR (TURBT);  Surgeon:  Christopher Hazard, MD;  Location: [facility];  Service: Urology;  Laterality: N/A;  Cystoscopy, TURBT, be prepared for Open Cystorraphy       Family History  Family History  Problem Relation Age of Onset  . Arthritis Son   . Heart Problems Son     Aortic valve replacement  . Lung cancer Daughter   . Heart attack Neg Hx   . Stroke Neg Hx   . Hypertension Neg Hx    Social History  reports that he quit smoking about 21 years ago. His smoking use included Cigars. His smokeless tobacco use includes Chew. He reports that he drinks alcohol. He reports that he does not use drugs. Allergies  Allergies  Allergen Reactions  . Neosporin [Neomycin-Bacitracin Zn-Polymyx] Other (See Comments)    Makes his skin red   . Penicillins Hives and Itching  .  Penicillin G Rash   Home medications Prior to Admission medications   Medication Sig Start Date End Date Taking? Authorizing Provider  acetaminophen (TYLENOL) 500 MG tablet Take 500 mg by mouth every 6 (six) hours as needed (pain).    Yes Historical Provider, MD  aspirin EC 81 MG tablet Take 81 mg by mouth.   Yes Historical Provider, MD  atorvastatin (LIPITOR) 20 MG tablet Take 20 mg by mouth every evening.    Yes Historical Provider, MD  dorzolamide-timolol (COSOPT) 22.3-6.8 MG/ML ophthalmic solution Place 1 drop into the right eye 2 (two) times daily. 04/19/16  Yes Historical Provider, MD  DUREZOL 0.05 % EMUL 1 DROP IN RIGHT EYE THREE TIMES A DAY 10/06/15  Yes Historical Provider, MD  hydrochlorothiazide (MICROZIDE) 12.5 MG capsule Take 1 capsule (12.5 mg total) by mouth daily with breakfast. 02/20/16  Yes Christopher Sprang, MD  Multiple Vitamin (MULTIVITAMIN WITH MINERALS) TABS Take 1 tablet by mouth daily.   Yes Historical Provider, MD  neomycin-polymyxin b-dexamethasone (MAXITROL) 3.5-10000-0.1 OINT as directed. 0.25 inch to left eye at bedtime 09/06/16  Yes Historical Provider, MD  Omega-3 1000 MG CAPS Take 1,000 mg by mouth 2 (two) times daily.    Yes Historical Provider, MD  polyethylene glycol (MIRALAX / GLYCOLAX) packet Take 17 g by mouth daily.    Yes Historical Provider, MD  prednisoLONE acetate (PRED FORTE) 1 % ophthalmic suspension Place 1 drop into both eyes 3 (three) times daily.   Yes Historical Provider, MD  Probiotic Product ([place]) Take 1 capsule by mouth daily.    Yes Historical Provider, MD  propafenone (RYTHMOL) 225 MG tablet Take 1 tablet (225 mg total) by mouth 2 (two) times daily. 08/19/16  Yes Christopher Sprang, MD  ramipril (ALTACE) 5 MG capsule Take 1 capsule (5 mg total) by mouth daily. 02/20/16  Yes Christopher Sprang, MD  sodium chloride (MURO 128) 2 % ophthalmic solution Place 1 drop into the right eye 3 (three) times daily.  04/19/16  Yes Historical Provider, MD   zinc gluconate 50 MG tablet Take 50 mg by mouth daily.   Yes Historical Provider, MD  amLODipine (NORVASC) 5 MG tablet TAKE 1 TABLET (5 MG TOTAL) BY MOUTH DAILY. Patient not taking: Reported on 01/03/2017 06/05/15   Christopher Sprang, MD  moxifloxacin (VIGAMOX) 0.5 % ophthalmic solution Place 1 drop into both eyes 3 (three) times daily. Patient not taking: Reported on 07/24/2016 02/11/16   April Palumbo, MD   Liver Function Tests  Recent Labs Lab 01/03/17 0311  AST 54*  ALT 54  ALKPHOS 82  BILITOT 1.3*  PROT 7.3  ALBUMIN 4.5   No results for input(s): LIPASE, AMYLASE in the last 168 hours. CBC  Recent Labs Lab 01/03/17 0220 01/03/17 0242 01/04/17 0117  WBC 12.3*  --  12.4*  NEUTROABS 9.2*  --   --   HGB 15.0 16.0 13.7  HCT 41.2 47.0 38.7*  MCV 91.8  --  95.1  PLT 223  --  503   Basic Metabolic Panel  Recent Labs Lab 01/04/17 0117 01/04/17 0958 01/04/17 1545 01/04/17 2059 01/05/17 0303 01/05/17 0908 01/05/17 1525  NA 117* 119* 119* 120* 120* 121* 120*  K 4.4 4.3 4.6 4.3 4.4 4.3 4.4  CL 85* 87* 89* 92* 90* 91* 91*  CO2 24 25 24 23 24 25 23   GLUCOSE 119* 135* 118* 118* 110* 103* 118*  BUN 20 20 22* 21* 21* 20 23*  CREATININE 0.92 0.88 0.98 0.87 0.91 0.87 0.90  CALCIUM 8.1* 8.5* 8.3* 8.2* 8.3* 8.4* 8.4*   Iron/TIBC/Ferritin/ %Sat No results found for: IRON, TIBC, FERRITIN, IRONPCTSAT  Vitals:   01/04/17 1447 01/04/17 2112 01/05/17 0522 01/05/17 1355  BP: 92/69 108/76 129/70 127/89  Pulse: (!) 51 63 63 (!) 58  Resp: 18 20 18 16   Temp: 98.5 F (36.9 C) 97.2 F (36.2 C) 97.4 F (36.3 C) 97.9 F (36.6 C)  TempSrc: Oral Oral Oral Axillary  SpO2: 95% 98% 99% 100%  Weight:      Height:       Exam Alert, no distress No rash, cyanosis or gangrene Sclera anicteric, throat clear and slightly dry  No jvd or bruits Chest clear bilat RRR no MRG Abd soft ntnd no mass or ascites +bs GU normal male MS no joint effusions or deformity Ext 1+ bilat pretib edema /  no wounds or ulcers Neuro is alert, Ox 3 , nf  Na 120  Creat 0.90  K 4.4  CO2 23 WBC 12k  Hb 13 plt 211 4/19 -  UNa 56,  Uosm 705 4/21 -  UNa <10, Uosm 775   Assessment: 1.  Hyponatremia - mixed picture, responded to NS but now has plateaued at 120.  Has some mild edema.  Uosm very high, would favor treating from here as euvolemic hypoNa+ most likely due to meds (HCTZ, acei) vs SIADH.  Plan is for tolvaptan to start tonight.  Will DC any fluid restriction while giving a vaptan.  2.  HTN - BP's were high, then low; acei/ HCTZ dc'd on admission.  Rec'd norvasc but then was dc'd with low BP's.  Now on only metoprolol.  ACEi and thiazide have been dc'd related to hypoNa+ and should be avoided if possible in the future.  Please communicate this to pt's PCP at dc.   3.  Hx NSVT - on propafenone 4.  Hx bladder Ca rx with TURBT (2013) 5.  Hx prostate Ca rx'd (2007  Plan - start tolvaptan, dc IVF's, and dc any fluid restriction for now.   Kelly Splinter MD [employer] pager [phone]   01/05/2017, 9:09 PM

## 2017-01-05 NOTE — Progress Notes (Signed)
PROGRESS NOTE    [name]  ATF:[id number] DOB: 1927/01/04 DOA: 01/03/2017 PCP: Tobie Lords D, FNP   Brief Narrative: [name] is a 83 y.o. male with medical history significant of hypertension, hyperlipidemia, prostate cancer (s/p of brachytherapy 2003), CAD, bladder cancer (s/p of tumor resection), ventricular bigeminy, ventricular tachycardia, first degree AV block. He was found to have severe hyponatremia and new onset atrial fibrillation.   Assessment & Plan:   Principal Problem:   Hyponatremia Active Problems:   Ventricular bigeminy   Nonsustained ventricular tachycardia (HCC)   Nonobstructive CAD (coronary artery disease)   Hypertension   Hyperlipidemia   Constipation   Atrial fibrillation (HCC)   Hyponatremia Severe. Improving slowly. Repeat urine osmolality is high with low urine sodium -continue normal saline. Goal is 8-10 meq per 24 hours -repeat BMP q6 hours -discontinue hydrochlorothiazide at discharge -nephrology consult  Atrial fibrillation New. CHA2DS2-VASc Score is 4. Cardiology recommending no anticoagulation -cardiology recommendations: no anticoagulation -telemetry  Constipation Improved with enema -continue miralax  Hypertension -continue amlodipine -continue hydralazine prn  Ventricular bigeminy Nonsustained V-tach -cardiology recommendations: discontinue propafenone secondary to atrial fibrillation -telemetry  Nonobstructive CAD -continue aspirin -continue Lipitor   Hyperlipidemia -continue Lipitor  Bilateral lung infiltrates Favor atelectasis rather than pneumonia likely secondary to patient's protuberant belly. Afebrile. -incentive spirometer   DVT prophylaxis: Lovenox Code Status: Full code Family Communication: None at bedside Disposition Plan: Discharge home pending improvement in hyponatremia   Consultants:   Cardiology  Nephrology  Procedures:   None  Antimicrobials:  None     Subjective: No confusion. No chest pain or abdominal pain. No dyspnea.  Objective: Vitals:   01/04/17 1001 01/04/17 1447 01/04/17 2112 01/05/17 0522  BP: 115/79 92/69 108/76 129/70  Pulse: (!) 114 (!) 51 63 63  Resp:  18 20 18   Temp:  98.5 F (36.9 C) 97.2 F (36.2 C) 97.4 F (36.3 C)  TempSrc:  Oral Oral Oral  SpO2:  95% 98% 99%  Weight:      Height:        Intake/Output Summary (Last 24 hours) at 01/05/17 1245 Last data filed at 01/04/17 1700  Gross per 24 hour  Intake             1865 ml  Output              250 ml  Net             1615 ml   Filed Weights   01/03/17 0222 01/03/17 0630  Weight: 81.6 kg (180 lb) 90.6 kg (199 lb 11.2 oz)    Examination:  General exam: Appears calm and comfortable Respiratory system: Clear to auscultation. Respiratory effort normal. Cardiovascular system: S1 & S2 heard, irregular rhythm, normal rate. No murmurs. Gastrointestinal system: Abdomen is distended, soft and nontender. Normal bowel sounds heard. Central nervous system: Alert and oriented. No focal neurological deficits. Extremities: No edema. No calf tenderness Skin: No cyanosis. No rashes Psychiatry: Judgement and insight appear normal. Mood & affect appropriate.     Data Reviewed: I have personally reviewed following labs and imaging studies  CBC:  Recent Labs Lab 01/03/17 0220 01/03/17 0242 01/04/17 0117  WBC 12.3*  --  12.4*  NEUTROABS 9.2*  --   --   HGB 15.0 16.0 13.7  HCT 41.2 47.0 38.7*  MCV 91.8  --  95.1  PLT 223  --  270   Basic Metabolic Panel:  Recent Labs Lab 01/03/17 1055  01/04/17 0958  01/04/17 1545 01/04/17 2059 01/05/17 0303 01/05/17 0908  NA 116*  < > 119* 119* 120* 120* 121*  K 3.7  < > 4.3 4.6 4.3 4.4 4.3  CL 84*  < > 87* 89* 92* 90* 91*  CO2 23  < > 25 24 23 24 25   GLUCOSE 114*  < > 135* 118* 118* 110* 103*  BUN 15  < > 20 22* 21* 21* 20  CREATININE 0.74  < > 0.88 0.98 0.87 0.91 0.87  CALCIUM 8.4*  < > 8.5* 8.3* 8.2* 8.3*  8.4*  MG 1.8  --   --   --   --   --   --   < > = values in this interval not displayed. GFR: Estimated Creatinine Clearance: 65.4 mL/min (by C-G formula based on SCr of 0.87 mg/dL). Liver Function Tests:  Recent Labs Lab 01/03/17 0311  AST 54*  ALT 54  ALKPHOS 82  BILITOT 1.3*  PROT 7.3  ALBUMIN 4.5   No results for input(s): LIPASE, AMYLASE in the last 168 hours. No results for input(s): AMMONIA in the last 168 hours. Coagulation Profile: No results for input(s): INR, PROTIME in the last 168 hours. Cardiac Enzymes:  Recent Labs Lab 01/03/17 0600 01/03/17 1055 01/03/17 1740  TROPONINI 0.06* 0.08* 0.08*   BNP (last 3 results) No results for input(s): PROBNP in the last 8760 hours. HbA1C: No results for input(s): HGBA1C in the last 72 hours. CBG:  Recent Labs Lab 01/03/17 0752 01/04/17 0737 01/05/17 0736  GLUCAP 131* 85 94   Lipid Profile:  Recent Labs  01/04/17 0117  CHOL 95  HDL 45  LDLCALC 41  TRIG 45  CHOLHDL 2.1   Thyroid Function Tests:  Recent Labs  01/03/17 0220  TSH 2.141   Anemia Panel: No results for input(s): VITAMINB12, FOLATE, FERRITIN, TIBC, IRON, RETICCTPCT in the last 72 hours. Sepsis Labs: No results for input(s): PROCALCITON, LATICACIDVEN in the last 168 hours.  No results found for this or any previous visit (from the past 240 hour(s)).       Radiology Studies: No results found.      Scheduled Meds: . acidophilus   Oral Daily  . aspirin EC  81 mg Oral Daily  . atorvastatin  20 mg Oral QPM  . dorzolamide-timolol  1 drop Right Eye BID  . enoxaparin (LOVENOX) injection  40 mg Subcutaneous Q24H  . metoprolol tartrate  25 mg Oral BID  . multivitamin with minerals  1 tablet Oral Daily  . neomycin-polymyxin b-dexamethasone   Left Eye QHS  . omega-3 acid ethyl esters  1,000 mg Oral BID  . polyethylene glycol  17 g Oral BID  . prednisoLONE acetate  1 drop Both Eyes TID  . senna-docusate  1 tablet Oral BID  . sodium  chloride  1 drop Right Eye TID  . sodium chloride flush  3 mL Intravenous Q12H  . zinc sulfate  220 mg Oral Daily   Continuous Infusions: . sodium chloride 125 mL/hr at 01/05/17 1131     LOS: 2 days     Cordelia Poche, MD [facility] 01/05/2017, 12:45 PM Pager: [phone]  If 7PM-7AM, please contact night-coverage [email or web address] Password [facility] 01/05/2017, 12:45 PM

## 2017-01-06 DIAGNOSIS — I499 Cardiac arrhythmia, unspecified: Secondary | ICD-10-CM

## 2017-01-06 LAB — BASIC METABOLIC PANEL
ANION GAP: 4 — AB (ref 5–15)
ANION GAP: 6 (ref 5–15)
ANION GAP: 5 (ref 5–15)
Anion gap: 8 (ref 5–15)
BUN: 19 mg/dL (ref 6–20)
BUN: 19 mg/dL (ref 6–20)
BUN: 20 mg/dL (ref 6–20)
BUN: 21 mg/dL — ABNORMAL HIGH (ref 6–20)
CALCIUM: 8.8 mg/dL — AB (ref 8.9–10.3)
CALCIUM: 9.1 mg/dL (ref 8.9–10.3)
CALCIUM: 9.1 mg/dL (ref 8.9–10.3)
CHLORIDE: 96 mmol/L — AB (ref 101–111)
CO2: 22 mmol/L (ref 22–32)
CO2: 26 mmol/L (ref 22–32)
CO2: 26 mmol/L (ref 22–32)
CO2: 23 mmol/L (ref 22–32)
CREATININE: 1 mg/dL (ref 0.61–1.24)
CREATININE: 0.92 mg/dL (ref 0.61–1.24)
Calcium: 9 mg/dL (ref 8.9–10.3)
Chloride: 100 mmol/L — ABNORMAL LOW (ref 101–111)
Chloride: 98 mmol/L — ABNORMAL LOW (ref 101–111)
Chloride: 102 mmol/L (ref 101–111)
Creatinine, Ser: 0.81 mg/dL (ref 0.61–1.24)
Creatinine, Ser: 0.91 mg/dL (ref 0.61–1.24)
GFR calc Af Amer: >60 mL/min (ref >60)
GFR calc Af Amer: >60 mL/min (ref >60)
GLUCOSE: 127 mg/dL — AB (ref 65–99)
GLUCOSE: 114 mg/dL — AB (ref 65–99)
GLUCOSE: 138 mg/dL — AB (ref 65–99)
Glucose, Bld: 109 mg/dL — ABNORMAL HIGH (ref 65–99)
POTASSIUM: 4 mmol/L (ref 3.5–5.1)
Potassium: 4 mmol/L (ref 3.5–5.1)
Potassium: 4.1 mmol/L (ref 3.5–5.1)
Potassium: 4.1 mmol/L (ref 3.5–5.1)
SODIUM: 126 mmol/L — AB (ref 135–145)
Sodium: 130 mmol/L — ABNORMAL LOW (ref 135–145)
Sodium: 130 mmol/L — ABNORMAL LOW (ref 135–145)
Sodium: 130 mmol/L — ABNORMAL LOW (ref 135–145)

## 2017-01-06 LAB — GLUCOSE, CAPILLARY: Glucose-Capillary: 92 mg/dL (ref 65–99)

## 2017-01-06 LAB — HEMOGLOBIN A1C
HEMOGLOBIN A1C: 5.8 % — AB (ref 4.8–5.6)
Mean Plasma Glucose: 120 mg/dL

## 2017-01-06 NOTE — Progress Notes (Signed)
Progress Note  Patient Name: [name] Date of Encounter: 01/06/2017  Primary Cardiologist: Dr. Caryl Comes  Subjective   Patient is feeling well; denies chest pain, SOB, and palpitations. Pt states he wants to discharge home today.  Inpatient Medications    Scheduled Meds: . acidophilus   Oral Daily  . aspirin EC  81 mg Oral Daily  . atorvastatin  20 mg Oral QPM  . dorzolamide-timolol  1 drop Right Eye BID  . enoxaparin (LOVENOX) injection  40 mg Subcutaneous Q24H  . metoprolol tartrate  25 mg Oral BID  . multivitamin with minerals  1 tablet Oral Daily  . neomycin-polymyxin b-dexamethasone   Left Eye QHS  . omega-3 acid ethyl esters  1,000 mg Oral BID  . polyethylene glycol  17 g Oral BID  . prednisoLONE acetate  1 drop Both Eyes TID  . senna-docusate  1 tablet Oral BID  . sodium chloride  1 drop Right Eye TID  . sodium chloride flush  3 mL Intravenous Q12H  . tolvaptan  15 mg Oral Q24H  . zinc sulfate  220 mg Oral Daily   Continuous Infusions:  PRN Meds: acetaminophen, ondansetron **OR** ondansetron (ZOFRAN) IV, zolpidem   Vital Signs    Vitals:   01/05/17 0522 01/05/17 1355 01/05/17 2030 01/06/17 0603  BP: 129/70 127/89 104/76 99/68  Pulse: 63 (!) 58 70 67  Resp: 18 16 20 20   Temp: 97.4 F (36.3 C) 97.9 F (36.6 C)  97.5 F (36.4 C)  TempSrc: Oral Axillary  Oral  SpO2: 99% 100% 96% 97%  Weight:      Height:        Intake/Output Summary (Last 24 hours) at 01/06/17 0836 Last data filed at 01/06/17 6063  Gross per 24 hour  Intake             1860 ml  Output             1100 ml  Net              760 ml   Filed Weights   01/03/17 0222 01/03/17 0630  Weight: 180 lb (81.6 kg) 199 lb 11.2 oz (90.6 kg)     Physical Exam   General: Well developed, well nourished, male appearing in no acute distress. Head: Normocephalic, atraumatic.  Neck: Supple without bruits, minimal JVD Lungs:  Resp regular and unlabored, CTA. Heart: Irregularly irregular  rhythm, regular rate, no murmur; no rub. Abdomen: Soft, non-tender, non-distended with normoactive bowel sounds. No hepatomegaly. No rebound/guarding. No obvious abdominal masses. Extremities: No clubbing, cyanosis, no edema. Distal pedal pulses are 1+ bilaterally. Neuro: Alert and oriented X 3. Moves all extremities spontaneously. Psych: Normal affect.  Labs    Chemistry Recent Labs Lab 01/03/17 0311  01/05/17 0908 01/05/17 1525 01/06/17 0538  NA 114*  < > 121* 120* 126*  K 4.4  < > 4.3 4.4 4.0  CL 80*  < > 91* 91* 96*  CO2 24  < > 25 23 22   GLUCOSE 149*  < > 103* 118* 109*  BUN 18  < > 20 23* 19  CREATININE 0.96  < > 0.87 0.90 0.81  CALCIUM 9.2  < > 8.4* 8.4* 8.8*  PROT 7.3  --   --   --   --   ALBUMIN 4.5  --   --   --   --   AST 54*  --   --   --   --  ALT 54  --   --   --   --   ALKPHOS 82  --   --   --   --   BILITOT 1.3*  --   --   --   --   GFRNONAA >60  < > >60 >60 >60  GFRAA >60  < > >60 >60 >60  ANIONGAP 10  < > 5 6 8   < > = values in this interval not displayed.   Hematology Recent Labs Lab 01/03/17 0220 01/03/17 0242 01/04/17 0117  WBC 12.3*  --  12.4*  RBC 4.49  --  4.07*  HGB 15.0 16.0 13.7  HCT 41.2 47.0 38.7*  MCV 91.8  --  95.1  MCH 33.4  --  33.7  MCHC 36.4*  --  35.4  RDW 13.2  --  13.3  PLT 223  --  211    Cardiac Enzymes Recent Labs Lab 01/03/17 0600 01/03/17 1055 01/03/17 1740  TROPONINI 0.06* 0.08* 0.08*    Recent Labs Lab 01/03/17 0240  TROPIPOC 0.07     BNPNo results for input(s): BNP, PROBNP in the last 168 hours.   DDimer No results for input(s): DDIMER in the last 168 hours.   Radiology    No results found.   Telemetry    Afib, rate controlled - Personally Reviewed  ECG    No new tracings - Personally Reviewed   Cardiac Studies   Echo 01/03/17:  Study Conclusions -Left ventricle: The cavity size was normal. Wall thickness was   increased in a pattern of moderate LVH. Systolic function was   normal.  The estimated ejection fraction was in the range of 60%   to 65%. Wall motion was normal; there were no regional wall   motion abnormalities. - Mitral valve: There was mild regurgitation. - Left atrium: The atrium was mildly dilated. - Right atrium: The atrium was moderately dilated.   ECG:  01/03/2017 Atrial fib, controlled ventricular response with heart rate 85 IVCD  LAD    Telemetry Atrial fibrillation was generally controlled ventricular response. Frequent PVCs and pairs, several triplets are seen. No longer runs.   Echo: February 26, 2011  EF of 55% with normal cavity size, mild LVH. Akinesis localized to the base of the inferolateral segment which was new from 2004.   Cath: 02/28/2011 Left ventricular angiography: This showed low normal LV systolic function with an estimated ejection fraction of 50-55%. CORONARY ANGIOGRAPHY: Left main coronary artery: The vessel is normal in size with mild calcifications. There is 10% disease distally close to the bifurcation. Left anterior descending artery: The vessel is relatively small in size with mild calcifications. There is 20% diffuse disease proximally as well as in the mid and the distal LAD without any other evidence of obstructive disease. Ramus coronary artery: The vessel is normal in size and free of significant disease. Left circumflex artery: The vessel is normal in size and free of significant disease. It has minor calcifications. Right coronary artery: The vessel overall is moderately-to-heavily calcified, especially in the proximal and mid segments. Proximally, there is a calcified nodule, causing a hazy appearance with an estimated resulting stenosis of 50%. In the mid segment, there is another discrete 40-50% stenosis. The rest of the right coronary artery has minor irregularities without any obstructive disease. STUDY CONCLUSIONS: 1. Mildly elevated filling pressures with only mild  pulmonary hypertension. Normal cardiac output. 2. Low normal LV systolic function with an estimated ejection fraction of 50-55%. 3. Moderate  right coronary artery disease which was found to be non- flow limiting by pressure wire interrogation. FFR ratio was 0.86. There is mild disease otherwise in the LAD. RECOMMENDATIONS: Aggressive medical therapy is recommended. No revascularization is advised.  Patient Profile     83 y.o. male  with hx of vent ectopy>>functional bradycardia, on propafenone, non-obs CAD at cath 2012, CKD, HTN, HLD, nl EF at echo 2012, bladder and prostate CA, HOH. Cardiology was consulted for Afib.  Assessment & Plan    1. Atrial fibrillation - he continues in rate controlled Afib with ventricular rates in the 50-70s - continue lopressor, heart rate well-controlled and he remains ansymptomatic   2. Frequent PVCs - pt on propafenone at home for frequent PVCs - propafenone held at this time, continues with frequent PVCs ([place] and [place])   3. Anticoagulation - This patients CHA2DS2-VASc Score and unadjusted Ischemic Stroke Rate (% per year) is equal to 4.8 % stroke rate/year from a score of 4 (age, HTN, CAD), indicating the pt would likely benefit from anticoagulation; however, given his recent falls will hold off on [facility] at this time. Because AC is not recommended, he would not be a good DCCV candidate.   4. Hypertension - nephrology D/C'ed ACEI and thiazide in the presence of euvolemic hyponatremia; norvasc has also been D/C'ed - continue lopressor 25 mg BID for heart rate control and pressure - pressure has been normotensive: sBP 90-120s   Signed, Ledora Bottcher , PA-C 8:36 AM 01/06/2017 Pager: [phone]

## 2017-01-06 NOTE — Progress Notes (Addendum)
Patient ID: ASAHD CAN, male   DOB: 1927-06-08, 83 y.o.   MRN: [id number] [facility] Progress Note   Assessment/ Plan:   1.Hyponatremia: Suspected to be in part from volume depletion with ongoing use of thiazide/ACE inhibitor. Partly corrected with normal saline and improved correction with Tolvaptan overnight. Given the rate of rise of sodium level, will discontinue Samsca today and monitor trend of sodium this evening and tomorrow. In September, 2017-his sodium level was 134 which may indeed be his baseline-not unusual in a patient of his age to have a reset osmostat and chronic low-level hyponatremia. 2. Hypotension: Relative hypotension noted at this point prompting discontinuation of antihypertensive therapy. Based on sodium levels tomorrow, will plan on initiating low-dose furosemide given current volume status. 3. History of nonsustained VT: On propafenone 4. History of bladder cancer status post TURBT and history of prostate cancer  Subjective:   Reports to be feeling fair and denies any chest pain or shortness of breath.    Objective:   BP 99/68 (BP Location: Left Arm)   Pulse 67   Temp 97.5 F (36.4 C) (Oral)   Resp 20   Ht 5\' 8"  (1.727 m)   Wt 90.6 kg (199 lb 11.2 oz)   SpO2 97%   BMI 30.36 kg/m   Intake/Output Summary (Last 24 hours) at 01/06/17 1341 Last data filed at 01/06/17 1230  Gross per 24 hour  Intake             2100 ml  Output             1300 ml  Net              800 ml   Weight change:   Physical Exam: [id number] resting up in recliner CVS: Pulse regular rhythm, normal rate, S1 and S2 normal Resp: Clear to auscultation, no rales/rhonchi Abd: Soft, obese, nontender Ext: Trace ankle edema  Imaging: No results found.  Labs: BMET  Recent Labs Lab 01/04/17 1545 01/04/17 2059 01/05/17 0303 01/05/17 0908 01/05/17 1525 01/06/17 0538 01/06/17 1100  NA 119* 120* 120* 121* 120* 126* 130*  K 4.6 4.3 4.4 4.3 4.4 4.0 4.0  CL  89* 92* 90* 91* 91* 96* 100*  CO2 24 23 24 25 23 22 26   GLUCOSE 118* 118* 110* 103* 118* 109* 127*  BUN 22* 21* 21* 20 23* 19 19  CREATININE 0.98 0.87 0.91 0.87 0.90 0.81 0.91  CALCIUM 8.3* 8.2* 8.3* 8.4* 8.4* 8.8* 9.0   CBC  Recent Labs Lab 01/03/17 0220 01/03/17 0242 01/04/17 0117  WBC 12.3*  --  12.4*  NEUTROABS 9.2*  --   --   HGB 15.0 16.0 13.7  HCT 41.2 47.0 38.7*  MCV 91.8  --  95.1  PLT 223  --  211   Medications:    . acidophilus   Oral Daily  . aspirin EC  81 mg Oral Daily  . atorvastatin  20 mg Oral QPM  . dorzolamide-timolol  1 drop Right Eye BID  . enoxaparin (LOVENOX) injection  40 mg Subcutaneous Q24H  . metoprolol tartrate  25 mg Oral BID  . multivitamin with minerals  1 tablet Oral Daily  . neomycin-polymyxin b-dexamethasone   Left Eye QHS  . omega-3 acid ethyl esters  1,000 mg Oral BID  . polyethylene glycol  17 g Oral BID  . prednisoLONE acetate  1 drop Both Eyes TID  . senna-docusate  1 tablet Oral BID  . sodium chloride  1 drop Right Eye TID  . sodium chloride flush  3 mL Intravenous Q12H  . tolvaptan  15 mg Oral Q24H  . zinc sulfate  220 mg Oral Daily   Elmarie Shiley, MD 01/06/2017, 1:41 PM

## 2017-01-06 NOTE — Progress Notes (Signed)
PROGRESS NOTE    [name]  ATF:[id number] DOB: [birth year] DOA: 01/03/2017 PCP: Tobie Lords D, FNP   Brief Narrative: [name] is a 83 y.o. male with medical history significant of hypertension, hyperlipidemia, prostate cancer (s/p of brachytherapy 2003), CAD, bladder cancer (s/p of tumor resection), ventricular bigeminy, ventricular tachycardia, first degree AV block. He was found to have severe hyponatremia and new onset atrial fibrillation.   Assessment & Plan:   Principal Problem:   Hyponatremia Active Problems:   Ventricular bigeminy   Nonsustained ventricular tachycardia (HCC)   Nonobstructive CAD (coronary artery disease)   Hypertension   Hyperlipidemia   Constipation   Atrial fibrillation (HCC)   Hyponatremia Severe. Improved slowly on IV fluids. Urine osmolality remained high with low urine sodium. Started on tolvaptan. Sodium improved to 126. -nephrology recommendations -continue BMP q6 hours -discontinue hydrochlorothiazide and ACEi at discharge  Atrial fibrillation New. CHA2DS2-VASc Score is 4. Cardiology recommending no anticoagulation -cardiology recommendations: no anticoagulation -telemetry  Constipation Improved with enema -continue miralax  Hypertension -continue amlodipine -continue hydralazine prn  Ventricular bigeminy Nonsustained V-tach -cardiology recommendations: discontinue propafenone secondary to atrial fibrillation -telemetry  Nonobstructive CAD -continue aspirin -continue Lipitor   Hyperlipidemia -continue Lipitor  Bilateral lung infiltrates Favor atelectasis rather than pneumonia likely secondary to patient's protuberant belly. Afebrile. -incentive spirometer   DVT prophylaxis: Lovenox Code Status: Full code Family Communication: None at bedside Disposition Plan: Discharge home pending improvement in hyponatremia   Consultants:   Cardiology  Nephrology  Procedures:    None  Antimicrobials:  None    Subjective: No confusion. No chest pain or abdominal pain. No dyspnea. Feels chilly. Afebrile.  Objective: Vitals:   01/05/17 0522 01/05/17 1355 01/05/17 2030 01/06/17 0603  BP: 129/70 127/89 104/76 99/68  Pulse: 63 (!) 58 70 67  Resp: 18 16 20 20   Temp: 97.4 F (36.3 C) 97.9 F (36.6 C)  97.5 F (36.4 C)  TempSrc: Oral Axillary  Oral  SpO2: 99% 100% 96% 97%  Weight:      Height:        Intake/Output Summary (Last 24 hours) at 01/06/17 1103 Last data filed at 01/06/17 0900  Gross per 24 hour  Intake             1860 ml  Output             1300 ml  Net              560 ml   Filed Weights   01/03/17 0222 01/03/17 0630  Weight: 81.6 kg (180 lb) 90.6 kg (199 lb 11.2 oz)    Examination:  General exam: Appears calm and comfortable Respiratory system: Clear to auscultation. Respiratory effort normal. Cardiovascular system: S1 & S2 heard, irregular rhythm, normal rate. No murmurs. Gastrointestinal system: Abdomen is distended, soft and nontender. Normal bowel sounds heard. Central nervous system: Alert and oriented. No focal neurological deficits. Extremities: No edema. No calf tenderness Skin: No cyanosis. No rashes Psychiatry: Judgement and insight appear normal. Mood & affect appropriate.     Data Reviewed: I have personally reviewed following labs and imaging studies  CBC:  Recent Labs Lab 01/03/17 0220 01/03/17 0242 01/04/17 0117  WBC 12.3*  --  12.4*  NEUTROABS 9.2*  --   --   HGB 15.0 16.0 13.7  HCT 41.2 47.0 38.7*  MCV 91.8  --  95.1  PLT 223  --  270   Basic Metabolic Panel:  Recent Labs Lab 01/03/17 1055  01/04/17 2059 01/05/17 0303 01/05/17 0908 01/05/17 1525 01/06/17 0538  NA 116*  < > 120* 120* 121* 120* 126*  K 3.7  < > 4.3 4.4 4.3 4.4 4.0  CL 84*  < > 92* 90* 91* 91* 96*  CO2 23  < > 23 24 25 23 22   GLUCOSE 114*  < > 118* 110* 103* 118* 109*  BUN 15  < > 21* 21* 20 23* 19  CREATININE 0.74  < >  0.87 0.91 0.87 0.90 0.81  CALCIUM 8.4*  < > 8.2* 8.3* 8.4* 8.4* 8.8*  MG 1.8  --   --   --   --   --   --   < > = values in this interval not displayed. GFR: Estimated Creatinine Clearance: 70.2 mL/min (by C-G formula based on SCr of 0.81 mg/dL). Liver Function Tests:  Recent Labs Lab 01/03/17 0311  AST 54*  ALT 54  ALKPHOS 82  BILITOT 1.3*  PROT 7.3  ALBUMIN 4.5   No results for input(s): LIPASE, AMYLASE in the last 168 hours. No results for input(s): AMMONIA in the last 168 hours. Coagulation Profile: No results for input(s): INR, PROTIME in the last 168 hours. Cardiac Enzymes:  Recent Labs Lab 01/03/17 0600 01/03/17 1055 01/03/17 1740  TROPONINI 0.06* 0.08* 0.08*   BNP (last 3 results) No results for input(s): PROBNP in the last 8760 hours. HbA1C: No results for input(s): HGBA1C in the last 72 hours. CBG:  Recent Labs Lab 01/03/17 0752 01/04/17 0737 01/05/17 0736 01/06/17 0736  GLUCAP 131* 85 94 92   Lipid Profile:  Recent Labs  01/04/17 0117  CHOL 95  HDL 45  LDLCALC 41  TRIG 45  CHOLHDL 2.1   Thyroid Function Tests: No results for input(s): TSH, T4TOTAL, FREET4, T3FREE, THYROIDAB in the last 72 hours. Anemia Panel: No results for input(s): VITAMINB12, FOLATE, FERRITIN, TIBC, IRON, RETICCTPCT in the last 72 hours. Sepsis Labs: No results for input(s): PROCALCITON, LATICACIDVEN in the last 168 hours.  No results found for this or any previous visit (from the past 240 hour(s)).       Radiology Studies: No results found.      Scheduled Meds: . acidophilus   Oral Daily  . aspirin EC  81 mg Oral Daily  . atorvastatin  20 mg Oral QPM  . dorzolamide-timolol  1 drop Right Eye BID  . enoxaparin (LOVENOX) injection  40 mg Subcutaneous Q24H  . metoprolol tartrate  25 mg Oral BID  . multivitamin with minerals  1 tablet Oral Daily  . neomycin-polymyxin b-dexamethasone   Left Eye QHS  . omega-3 acid ethyl esters  1,000 mg Oral BID  .  polyethylene glycol  17 g Oral BID  . prednisoLONE acetate  1 drop Both Eyes TID  . senna-docusate  1 tablet Oral BID  . sodium chloride  1 drop Right Eye TID  . sodium chloride flush  3 mL Intravenous Q12H  . tolvaptan  15 mg Oral Q24H  . zinc sulfate  220 mg Oral Daily   Continuous Infusions:    LOS: 3 days     Cordelia Poche, MD [facility] 01/06/2017, 11:03 AM Pager: [phone]  If 7PM-7AM, please contact night-coverage [email or web address] Password [email or web address] 01/06/2017, 11:03 AM

## 2017-01-06 NOTE — Progress Notes (Signed)
Physical Therapy Treatment Patient Details Name: [name] MRN: [id number] DOB: 1927/06/25 Today's Date: 01/06/2017    History of Present Illness  83 y.o. 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  Pt admitted for hyponatremia and new atrial fibrillation (cardiology following).    PT Comments    Assisted out of recliner to amb to bathroom.  Pt voided and BM.  Assisted with amb a greater distance in hallway.  Pt feeling better.   Follow Up Recommendations  Home health PT;Supervision/Assistance - 24 hour     Equipment Recommendations  Rolling walker with 5" wheels    Recommendations for Other Services       Precautions / Restrictions Precautions Precautions: Fall Precaution Comments: watch HR Restrictions Weight Bearing Restrictions: No    Mobility  Bed Mobility               General bed mobility comments: OOB in recliner  Transfers Overall transfer level: Needs assistance Equipment used: Rolling walker (2 wheeled) Transfers: Sit to/from Stand Sit to Stand: Min guard         General transfer comment: vc for hand placement and safety  Ambulation/Gait Ambulation/Gait assistance: Min guard Ambulation Distance (Feet): 85 Feet Assistive device: Rolling walker (2 wheeled) Gait Pattern/deviations: Step-through pattern;Trunk flexed;Decreased stride length Gait velocity: decreased   General Gait Details: tolerated an increased distance.  Feeling better.  Avg HR 88.  No weezying.  No dyspnea.    Stairs            Wheelchair Mobility    Modified Rankin (Stroke Patients Only)       Balance Overall balance assessment: Needs assistance Sitting-balance support: Feet supported;Bilateral upper extremity supported Sitting balance-Leahy Scale: Good      Standing balance support: Single extremity supported;During functional activity Standing balance-Leahy Scale: Poor Standing balance comment: requires UE support                             Cognition Arousal/Alertness: Awake/alert Behavior During Therapy: WFL for tasks assessed/performed Overall Cognitive Status: Within Functional Limits for tasks assessed                                        Exercises      General Comments        Pertinent Vitals/Pain Pain Assessment: No/denies pain    Home Living                      Prior Function            PT Goals (current goals can now be found in the care plan section) Acute Rehab PT Goals Patient Stated Goal: to be more independent Progress towards PT goals: Progressing toward goals    Frequency    Min 3X/week      PT Plan      Co-evaluation             End of Session Equipment Utilized During Treatment: Gait belt Activity Tolerance: Patient tolerated treatment well Patient left: in chair;with call bell/phone within reach;with chair alarm set Nurse Communication: Mobility status PT Visit Diagnosis: Muscle weakness (generalized) (M62.81);Difficulty in walking, not elsewhere classified (  R26.2)     Time: 8756-4332 PT Time Calculation (min) (ACUTE ONLY): 19 min  Charges:  $Gait Training: 8-22 mins                    G Codes:       Rica Koyanagi  PTA [facility]  Acute  Rehab Pager      [phone]

## 2017-01-06 NOTE — Progress Notes (Signed)
Occupational Therapy Treatment Patient Details Name: [name] MRN: [id number] DOB: 03/11/1927 Today's Date: 01/06/2017    History of present illness  83 y.o. 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  Pt admitted for hyponatremia and new atrial fibrillation (cardiology following).   OT comments  Pt progressing towards goals, completing room level functional mobility, sitting and standing ADLs with seated rest break. Questions answered and education provided. Continue per POC.    Follow Up Recommendations  SNF;Supervision/Assistance - 24 hour    Equipment Recommendations  3 in 1 bedside commode          Precautions / Restrictions Precautions Precautions: Fall Precaution Comments: watch HR Restrictions Weight Bearing Restrictions: No       Mobility Bed Mobility               General bed mobility comments: OOB in recliner  Transfers Overall transfer level: Needs assistance Equipment used: Rolling walker (2 wheeled) Transfers: Sit to/from Stand Sit to Stand: Min guard         General transfer comment: vc for hand placement and safety    Balance Overall balance assessment: Needs assistance Sitting-balance support: Feet supported;Bilateral upper extremity supported Sitting balance-Leahy Scale: Good     Standing balance support: Single extremity supported;During functional activity Standing balance-Leahy Scale: Poor Standing balance comment: requires UE support                            ADL either performed or assessed with clinical judgement   ADL Overall ADL's : Needs assistance/impaired Eating/Feeding: Independent;Sitting   Grooming: Wash/dry hands;Min guard;Standing;Oral care           Upper Body Dressing : Sitting;Min guard        Toilet Transfer: Minimal assistance;Comfort height toilet;Grab bars;RW;Ambulation Toilet Transfer Details (indicate cue type and reason): pulling up using grab bar; completed transfer x3 times throughout session Toileting- Clothing Manipulation and Hygiene: Minimal assistance;Sit to/from stand Toileting - Clothing Manipulation Details (indicate cue type and reason): assist for hygiene after BM     Functional mobility during ADLs: Minimal assistance;Rolling walker General ADL Comments: Pt completed toileting x3 times throughout session, completed standing grooming ADLs, noted increased SOB during standing grooming ADLs, instructed Pt to have seated rest break with symptoms subsiding                        Cognition Arousal/Alertness: Awake/alert Behavior During Therapy: WFL for tasks assessed/performed Overall Cognitive Status: Within Functional Limits for tasks assessed                                                      General Comments      Pertinent Vitals/ Pain       Pain Assessment: No/denies pain  Frequency  Min 2X/week        Progress Toward Goals  OT Goals(current goals can now be found in the care plan section)  Progress towards OT goals: Progressing toward goals  Acute Rehab OT Goals Patient Stated Goal: to be more independent OT Goal Formulation: With patient Time For Goal Achievement: 01/10/17 Potential to Achieve Goals: Good  Plan Discharge plan remains appropriate                     End of Session Equipment Utilized During Treatment: Gait belt;Rolling walker  OT Visit Diagnosis: Unsteadiness on feet (R26.81);Muscle weakness (generalized) (M62.81)   Activity Tolerance Patient tolerated treatment well   Patient Left in chair;with call bell/phone within reach             Time: 5732-2025 OT Time Calculation (min): 37  min  Charges: OT General Charges $OT Visit: 1 Procedure OT Treatments $Self Care/Home Management : 23-37 mins  [name], OT Pager [phone] 01/06/2017    [name] 01/06/2017, 1:37 PM

## 2017-01-06 NOTE — Care Management Note (Signed)
Case Management Note  Patient Details  Name: MATHIAS BOGACKI MRN: [id number] Date of Birth: 1927-07-16  Subjective/Objective: 83 y/o m admitted w/hyponatremia. From home w/spouse. PTT-recc HHPT. Patient already has rw,3n1. Patient defers to spouse for [facility] agency choice-TC Romie Minus @ [facility] tel#[phone]-left VM w/call back #-await call back for [facility] agency choice. May d/c in am if stable. Await HHPT,[id number] order.                   Action/Plan:d/c plan home w/HHC.   Expected Discharge Date:                  Expected Discharge Plan:  [place]  In-House Referral:  Clinical Social Work  Discharge planning Services  CM Consult  Post Acute Care Choice:  Durable Medical Equipment (Has rw,3n1) Choice offered to:  Spouse  DME Arranged:    DME Agency:     HH Arranged:  PT HH Agency:     Status of Service:  In process, will continue to follow  If discussed at Long Length of Stay Meetings, dates discussed:    Additional Comments:  Dessa Phi, RN 01/06/2017, 11:53 AM

## 2017-01-06 NOTE — Progress Notes (Signed)
CSW received consult for SNF placement, CSW informed by [facility] that patient is discharging home with home health services. CSW signing off, please consult if new needs arise.   Abundio Miu, [place] Social Worker [facility] Cell#: [phone]

## 2017-01-07 LAB — BASIC METABOLIC PANEL
Anion gap: 7 (ref 5–15)
BUN: 22 mg/dL — AB (ref 6–20)
CALCIUM: 8.9 mg/dL (ref 8.9–10.3)
CO2: 25 mmol/L (ref 22–32)
Chloride: 99 mmol/L — ABNORMAL LOW (ref 101–111)
Creatinine, Ser: 0.91 mg/dL (ref 0.61–1.24)
GFR calc Af Amer: >60 mL/min (ref >60)
Glucose, Bld: 125 mg/dL — ABNORMAL HIGH (ref 65–99)
Potassium: 4.1 mmol/L (ref 3.5–5.1)
SODIUM: 131 mmol/L — AB (ref 135–145)

## 2017-01-07 LAB — GLUCOSE, CAPILLARY: GLUCOSE-CAPILLARY: 101 mg/dL — AB (ref 65–99)

## 2017-01-07 MED ORDER — FUROSEMIDE 20 MG PO TABS
20.0000 mg | ORAL_TABLET | Freq: Every day | ORAL | Status: DC
  Administered 2017-01-07: 20 mg via ORAL
  Filled 2017-01-07: qty 1

## 2017-01-07 MED ORDER — FUROSEMIDE 20 MG PO TABS: 20.0000 mg | ORAL_TABLET | Freq: Every day | ORAL | 0 refills | Status: DC

## 2017-01-07 MED ORDER — METOPROLOL TARTRATE 25 MG PO TABS: 25.0000 mg | ORAL_TABLET | Freq: Two times a day (BID) | ORAL | 0 refills | Status: DC

## 2017-01-07 NOTE — Progress Notes (Signed)
Physical Therapy Treatment Patient Details Name: [name] MRN: [id number] DOB: 09/14/1927 Today's Date: 01/07/2017    History of Present Illness  83 y.o. 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  Pt admitted for hyponatremia and new atrial fibrillation (cardiology following).    PT Comments    Assisted out of recliner to amb in hallway then assisted to bathroom.  Pt progressing well and plans to D/C to home later today.  Follow Up Recommendations  Home health PT;Supervision/Assistance - 24 hour     Equipment Recommendations  Rolling walker with 5" wheels (pt stated his wife has a walker and might be able to use that one)    Recommendations for Other Services       Precautions / Restrictions Precautions Precautions: Fall Precaution Comments: monitor HR Restrictions Weight Bearing Restrictions: No    Mobility  Bed Mobility               General bed mobility comments: OOB in recliner  Transfers Overall transfer level: Needs assistance Equipment used: Rolling walker (2 wheeled) Transfers: Sit to/from Stand Sit to Stand: Min guard         General transfer comment: vc for hand placement and safety plus increased time  Ambulation/Gait Ambulation/Gait assistance: Min guard Ambulation Distance (Feet): 74 Feet Assistive device: Rolling walker (2 wheeled) Gait Pattern/deviations: Step-through pattern;Trunk flexed;Decreased stride length Gait velocity: decreased   General Gait Details: tolerated well.  Avg HR 78   Stairs            Wheelchair Mobility    Modified Rankin (Stroke Patients Only)       Balance                                            Cognition Arousal/Alertness: Awake/alert Behavior During Therapy: WFL for  tasks assessed/performed Overall Cognitive Status: Within Functional Limits for tasks assessed                                        Exercises      General Comments        Pertinent Vitals/Pain Pain Assessment: No/denies pain    Home Living                      Prior Function            PT Goals (current goals can now be found in the care plan section) Progress towards PT goals: Progressing toward goals    Frequency    Min 3X/week      PT Plan      Co-evaluation             End of Session Equipment Utilized During Treatment: Gait belt   Patient left: in chair;with call bell/phone within reach;with chair alarm set Nurse Communication: Mobility status PT Visit Diagnosis: Muscle weakness (generalized) (M62.81);Difficulty in walking, not elsewhere classified (R26.2)     Time: 1325-1350 PT Time Calculation (min) (ACUTE ONLY): 25 min  Charges:  $Gait Training: 8-22 mins $Therapeutic Activity: 8-22 mins  G Codes:       Rica Koyanagi  PTA [facility]  Acute  Rehab Pager      [phone]

## 2017-01-07 NOTE — Progress Notes (Signed)
Patient ID: [name], male   DOB: 09-29-1926, 83 y.o.   MRN: [id number] [facility] Progress Note   Assessment/ Plan:   1.Hyponatremia: Suspected in part from volume depletion with ongoing use of thiazide/ACE inhibitor. Partly corrected with normal saline and improved further with Tolvaptan. Sodium level now appears stable at about 131-he does not have any obvious neurological symptoms arising from this and I recommend starting furosemide 20 mg daily to help augment diuresis and improve hyponatremia further. In September, 2017-his sodium level was 134 which may indeed be his baseline-not unusual in a patient of his age to have a reset osmostat and chronic low-level hyponatremia. 2. Hypotension: Blood pressure is under fair control-start furosemide 20 mg daily. 3. History of nonsustained VT: On propafenone 4. History of bladder cancer status post TURBT and history of prostate cancer  I would recommend ambulating the patient in order to assess his balance and further evaluate neurological status prior to discharge to skilled nursing facility. From a renal standpoint, okay to discharge to SNF on oral furosemide. Recommend following up with his primary care provider with labs in 1 week. ACE inhibitor can be started after sodium levels are >135 (ACE inhibitors would not cause hyponatremia but make correction more difficult).  Subjective:   Reports that he continues to feel fair and is concerned that his sodium level is not up to 135.    Objective:   BP 130/84 (BP Location: Left Arm)   Pulse 82   Temp 97.6 F (36.4 C) (Oral)   Resp 18   Ht 5\' 8"  (1.727 m)   Wt 90.6 kg (199 lb 11.2 oz)   SpO2 100%   BMI 30.36 kg/m  No intake or output data in the 24 hours ending 01/07/17 1246 Weight change:   Physical Exam: IWL:[id number] resting up in recliner CVS: Pulse regular rhythm, normal rate, S1 and S2 normal Resp: Clear to auscultation, no rales/rhonchi Abd: Soft,  Distended/obese, nontender Ext: Trace ankle edema  Imaging: No results found.  Labs: BMET  Recent Labs Lab 01/05/17 0908 01/05/17 1525 01/06/17 0538 01/06/17 1100 01/06/17 1737 01/06/17 2300 01/07/17 0636  NA 121* 120* 126* 130* 130* 130* 131*  K 4.3 4.4 4.0 4.0 4.1 4.1 4.1  CL 91* 91* 96* 100* 98* 102 99*  CO2 25 23 22 26 26 23 25   GLUCOSE 103* 118* 109* 127* 114* 138* 125*  BUN 20 23* 19 19 20  21* 22*  CREATININE 0.87 0.90 0.81 0.91 1.00 0.92 0.91  CALCIUM 8.4* 8.4* 8.8* 9.0 9.1 9.1 8.9   CBC  Recent Labs Lab 01/03/17 0220 01/03/17 0242 01/04/17 0117  WBC 12.3*  --  12.4*  NEUTROABS 9.2*  --   --   HGB 15.0 16.0 13.7  HCT 41.2 47.0 38.7*  MCV 91.8  --  95.1  PLT 223  --  211   Medications:    . acidophilus   Oral Daily  . aspirin EC  81 mg Oral Daily  . atorvastatin  20 mg Oral QPM  . dorzolamide-timolol  1 drop Right Eye BID  . enoxaparin (LOVENOX) injection  40 mg Subcutaneous Q24H  . metoprolol tartrate  25 mg Oral BID  . multivitamin with minerals  1 tablet Oral Daily  . neomycin-polymyxin b-dexamethasone   Left Eye QHS  . omega-3 acid ethyl esters  1,000 mg Oral BID  . polyethylene glycol  17 g Oral BID  . prednisoLONE acetate  1 drop Both Eyes TID  .  senna-docusate  1 tablet Oral BID  . sodium chloride  1 drop Right Eye TID  . sodium chloride flush  3 mL Intravenous Q12H  . zinc sulfate  220 mg Oral Daily   Elmarie Shiley, MD 01/07/2017, 12:46 PM

## 2017-01-07 NOTE — Care Management Note (Signed)
Case Management Note  Patient Details  Name: [name] MRN: [id number] Date of Birth: [birth year]  Subjective/Objective:  Spouse initially chose [facility]-they are unable to accept-spouse chose [facility] aware of d/c today & HHPT order. No further CM needs. Patient has all dme.Spouse to have own transp @ d/c.                  Action/Plan:d/c home w/HHC.   Expected Discharge Date:  01/07/17               Expected Discharge Plan:  [place]  In-House Referral:  Clinical Social Work  Discharge planning Services  CM Consult  Post Acute Care Choice:  Durable Medical Equipment (Has rw,3n1) Choice offered to:  Spouse  DME Arranged:    DME Agency:     HH Arranged:  PT HH Agency:  [place]  Status of Service:  Completed, signed off  If discussed at [place] of Stay Meetings, dates discussed:    Additional Comments:  Dessa Phi, RN 01/07/2017, 1:19 PM

## 2017-01-07 NOTE — Discharge Instructions (Signed)
[name]  You were admitted because of low sodium (hyponatremia). This was corrected with fluids and medications. You will go home on Lasix. Your hydrochlorothiazide will be discontinued and lisinopril can be restarted by your primary care physician if your sodium is above 135.  You were also found to have atrial fibrillation. This is new. A lot of times, blood thinning is required when you have this, but because of your risk factors, the cardiologists felt that controlling your rate alone would be safer than also starting a blood thinner.  Please follow-up with your primary care physician.

## 2017-01-07 NOTE — Discharge Summary (Signed)
Physician Discharge Summary  [name] VZD:[id number] DOB: 1927/08/11 DOA: 01/03/2017  PCP: Gregor Hams, FNP  Admit date: 01/03/2017 Discharge date: 01/07/2017  Admitted From: Home Disposition: Home  Recommendations for Outpatient Follow-up:  1. Follow up with PCP in 1 week 2. Follow up with cardiology 3. Consider repeat chest x-ray in 1-2 weeks 4. Please obtain BMP in one week to recheck sodium  Home Health: Physical therapy Equipment/Devices: 3-in-1  Discharge Condition: Stable CODE STATUS: Full code Diet recommendation: Heart healthy   Brief/Interim Summary:  Admission HPI written by Ivor Costa, MD   Chief Complaint: Constipation, nausea, abdominal distention, generalized weakness  HPI: [name] is a 83 y.o. male with medical history significant of hypertension, hyperlipidemia, prostate cancer (s/p of brachytherapy 2003), CAD, bladder cancer (s/p of tumor resection), ventricular bigeminy, ventricular tachycardia, first degree AV block, who presents with constipation, nausea, abdominal distention and generalized weakness.  Patient states that he has been having constipation, nausea, abdominal distention and generalized weakness for 5 days. His last bowel movement was 3 days ago. He tried MiraLAX without help. He has mild abdominal pain, but no vomiting or diarrhea. Patient denies chest pain, cough, SOB, fever or chills. No symptoms of UTI or unilateral weakness.  ED Course: pt was found to have WBC 12.3, pending urinalysis, sodium 113, creatinine normal, temperature normal, no tachycardia, oxygen saturation 95% on room air, negative CT-head is negative for acute intracranial abnormalities. Pt is admitted to tele bed as inpt.  # CT abdomen/pelvis showed: Infiltration or consolidation in both lung bases with small right pleural effusion; small amount of free fluid around the liver likely ascites. Sludge in the gallbladder. No evidence of bowel obstruction or  inflammation.Aortic atherosclerosis. Prostate seed implants.    Hospital course:  Hyponatremia Severe. Improved slowly on IV fluids and then developed plateau. Repeat urine osmolality was persistently high with undetectably low sodium (high on admission). Nephrology was consulted and patient given Tolvaptan in addition to cessation of IV fluids for treatment of a mixed dehydration/SIADH picture. Patient's sodium improved significantly and he was started on lasix. Sodium of 131 before discharge. Discharge with lasix and discontinue hydrochlorothiazide on discharge. Restart ramapril once sodium level is greater than 135. Can consider discontinuing lasix if hyponatremia full resolved.  Atrial fibrillation New. CHA2DS2-VASc Scoreis 4. Cardiology recommending no anticoagulation secondary to high risk for bleeding. Started on a beta blocker. Rate controlled. Rhythm irregular. Follow-up with cardiologist as an outpatient.  Constipation Improved with enema. Continued miralax  Hypertension Continued amlodipine and metoprolol started for atrial fibrillation. Discontinued hydrochlorothiazide and ramipril on discharge.  Ventricular bigeminy Nonsustained V-tach Cardiology recommendations included discontinuation propafenone secondary to atrial fibrillation. Follow-up as outpatient.  Nonobstructive CAD Continued aspirin and Lipitor  Hyperlipidemia Continued Lipitor  Bilateral lung infiltrates Favor atelectasis rather than pneumonia likely secondary to patient's protuberant belly. Afebrile. Given incentive spirometry to help expand lungs.  Discharge Diagnoses:  Principal Problem:   Hyponatremia Active Problems:   Ventricular bigeminy   Nonsustained ventricular tachycardia (HCC)   Nonobstructive CAD (coronary artery disease)   Hypertension   Hyperlipidemia   Constipation   Atrial fibrillation [facility])    Discharge Instructions  Discharge Instructions    Call MD for:  difficulty  breathing, headache or visual disturbances    Complete by:  As directed    Call MD for:  extreme fatigue    Complete by:  As directed    Call MD for:  persistant dizziness or light-headedness    Complete  by:  As directed    Call MD for:  persistant nausea and vomiting    Complete by:  As directed    Call MD for:  temperature >100.4    Complete by:  As directed      Allergies as of 01/07/2017      Reactions   Neosporin [neomycin-bacitracin Zn-polymyx] Other (See Comments)   Makes his skin red   Penicillins Hives, Itching   Penicillin G Rash      Medication List    STOP taking these medications   amLODipine 5 MG tablet Commonly known as:  NORVASC   hydrochlorothiazide 12.5 MG capsule Commonly known as:  MICROZIDE   moxifloxacin 0.5 % ophthalmic solution Commonly known as:  VIGAMOX   propafenone 225 MG tablet Commonly known as:  RYTHMOL   ramipril 5 MG capsule Commonly known as:  ALTACE     TAKE these medications   acetaminophen 500 MG tablet Commonly known as:  TYLENOL Take 500 mg by mouth every 6 (six) hours as needed (pain).   aspirin EC 81 MG tablet Take 81 mg by mouth.   atorvastatin 20 MG tablet Commonly known as:  LIPITOR Take 20 mg by mouth every evening.   dorzolamide-timolol 22.3-6.8 MG/ML ophthalmic solution Commonly known as:  COSOPT Place 1 drop into the right eye 2 (two) times daily.   DUREZOL 0.05 % Emul Generic drug:  Difluprednate 1 DROP IN RIGHT EYE THREE TIMES A DAY   furosemide 20 MG tablet Commonly known as:  LASIX Take 1 tablet (20 mg total) by mouth daily.   metoprolol tartrate 25 MG tablet Commonly known as:  LOPRESSOR Take 1 tablet (25 mg total) by mouth 2 (two) times daily.   multivitamin with minerals Tabs tablet Take 1 tablet by mouth daily.   neomycin-polymyxin b-dexamethasone 3.5-10000-0.1 Oint Commonly known as:  MAXITROL as directed. 0.25 inch to left eye at bedtime   Omega-3 1000 MG Caps Take 1,000 mg by mouth 2  (two) times daily.   PHILLIPS COLON HEALTH PO Take 1 capsule by mouth daily.   polyethylene glycol packet Commonly known as:  MIRALAX / GLYCOLAX Take 17 g by mouth daily.   prednisoLONE acetate 1 % ophthalmic suspension Commonly known as:  PRED FORTE Place 1 drop into both eyes 3 (three) times daily.   sodium chloride 2 % ophthalmic solution Commonly known as:  MURO 128 Place 1 drop into the right eye 3 (three) times daily.   zinc gluconate 50 MG tablet Take 50 mg by mouth daily.            Durable Medical Equipment        Start     Ordered   01/03/17 1859  For home use only DME 3 n 1  Once     01/03/17 1858     Follow-up Information    Gregor Hams, FNP. Schedule an appointment as soon as possible for a visit in 1 week(s).   Specialty:  Family Medicine Contact information: [place] [address] [place] Follow up.   Specialty:  [place] Why:  [facility] physical therapy Contact information: [place] [address] [phone]        Virl Axe, MD. Schedule an appointment as soon as possible for a visit.   Specialty:  Cardiology Contact information: [address] [place] [address]  [phone]          Allergies  Allergen Reactions  . Neosporin [Neomycin-Bacitracin Zn-Polymyx] Other (See Comments)    Makes his skin red   . Penicillins Hives and Itching  . Penicillin G Rash    Consultations:  Cardiology  Nephrology   Procedures/Studies: Ct Abdomen Pelvis Wo Contrast  Result Date: 01/03/2017 CLINICAL DATA:  Increasing weakness for 4 days. Abdominal pain, bloating, nausea, and constipation. White cell count 12.3. History of bladder and prostate cancer. Chronic renal disease. Unable to give IV contrast material. EXAM: CT ABDOMEN AND PELVIS WITHOUT CONTRAST TECHNIQUE: Multidetector CT imaging of the abdomen and  pelvis was performed following the standard protocol without IV contrast. COMPARISON:  06/24/2014 FINDINGS: Lower chest: Small to moderate right pleural effusion with basilar infiltration or atelectasis. Focal infiltration in the left lung base. Cardiac enlargement. Coronary artery calcifications. Hepatobiliary: No focal liver lesions identified. Increased density within the gallbladder suggesting sludge. No wall thickening. No bile duct dilatation. Small amount of free fluid around the liver suggesting ascites. Circumscribed soft tissue nodule inferior to the liver edge posteriorly. No change since prior study. Pancreas: Unremarkable. No pancreatic ductal dilatation or surrounding inflammatory changes. Spleen: Normal in size without focal abnormality. Adrenals/Urinary Tract: No adrenal gland nodules. Kidneys are symmetrical in size. No hydronephrosis or hydroureter. No renal, ureteral, or bladder stones. Bladder wall is not thickened. Stomach/Bowel: Stomach, small bowel, and colon are not abnormally distended. No wall thickening is appreciated. Appendix is not identified. Vascular/Lymphatic: Aortic atherosclerosis. No enlarged abdominal or pelvic lymph nodes. Reproductive: Prostate seed implants are present. Other: No free air in the abdomen. Abdominal wall musculature appears intact. Musculoskeletal: Degenerative changes in the spine. No destructive bone lesions identified. IMPRESSION: Infiltration or consolidation in both lung bases with small right pleural effusion. Small amount of free fluid around the liver likely ascites. Sludge in the gallbladder. No evidence of bowel obstruction or inflammation. Aortic atherosclerosis.  Prostate seed implants. Electronically Signed   By: Lucienne Capers M.D.   On: 01/03/2017 05:10   Ct Head Wo Contrast  Result Date: 01/03/2017 CLINICAL DATA:  Increasing weakness for 4 days. Dizziness. History of bladder cancer and prostate cancer. Hypertension. EXAM: CT HEAD WITHOUT  CONTRAST TECHNIQUE: Contiguous axial images were obtained from the base of the skull through the vertex without intravenous contrast. COMPARISON:  02/11/2016 FINDINGS: Brain: Mild diffuse cerebral atrophy. Ventricular dilatation consistent with central atrophy. Low-attenuation changes in the deep white matter consistent with small vessel ischemia. No mass effect or midline shift. No abnormal extra-axial fluid collections. Gray-white matter junctions are distinct. Basal cisterns are not effaced. No acute intracranial hemorrhage. Vascular: Intracranial arterial vascular calcifications are present. Skull: No depressed skull fractures. Sinuses/Orbits: Mucosal thickening in the paranasal sinuses. No acute air-fluid levels. Mastoid air cells are not opacified. Other: No significant changes in the intracranial contents since previous study. IMPRESSION: No acute intracranial abnormalities. Mild chronic atrophy and small vessel ischemic changes. Electronically Signed   By: Lucienne Capers M.D.   On: 01/03/2017 02:59   Dg Abd Acute W/chest  Result Date: 01/03/2017 CLINICAL DATA:  Nausea, vomiting, mid abdominal pain, and weakness for 48 hours. EXAM: DG ABDOMEN ACUTE W/ 1V CHEST COMPARISON:  CT abdomen and pelvis 06/24/2014 FINDINGS: Shallow inspiration. Infiltration or atelectasis in the right lung base. Cardiac enlargement without vascular congestion. No blunting of costophrenic angles. No pneumothorax. Scattered gas and stool throughout the colon without distention. Mildly distended gas-filled small bowel could represent early small bowel obstruction or  enteritis. No abnormal air-fluid levels. No free air. No radiopaque stones. Degenerative changes in the spine and hips. IMPRESSION: Infiltration or atelectasis in the right lung base. Cardiac enlargement without vascular congestion. Mildly dilated gas-filled small bowel may indicate early small bowel obstruction or enteritis. No free air. Electronically Signed   By:  Lucienne Capers M.D.   On: 01/03/2017 02:56      Subjective: No chest pain, dyspnea or abdominal pain  Discharge Exam: Vitals:   01/07/17 0451 01/07/17 0938  BP: 98/73 130/84  Pulse: 80 82  Resp: 18   Temp: 97.6 F (36.4 C)    Vitals:   01/06/17 1320 01/06/17 2043 01/07/17 0451 01/07/17 0938  BP: 105/69 (!) 129/96 98/73 130/84  Pulse: 71 76 80 82  Resp: 18 18 18    Temp: 98.1 F (36.7 C) 97.5 F (36.4 C) 97.6 F (36.4 C)   TempSrc: Oral Oral Oral   SpO2: 96% 98% 100%   Weight:      Height:        General: Pt is alert, awake, not in acute distress Cardiovascular: irregular rhythm, normal rate, S1/S2 +, no rubs, no gallops Respiratory: CTA bilaterally, no wheezing, no rhonchi Abdominal: Soft, NT, distended, bowel sounds + Extremities: no edema, no cyanosis    The results of significant diagnostics from this hospitalization (including imaging, microbiology, ancillary and laboratory) are listed below for reference.     Labs: Basic Metabolic Panel:  Recent Labs Lab 01/03/17 1055  01/06/17 0538 01/06/17 1100 01/06/17 1737 01/06/17 2300 01/07/17 0636  NA 116*  < > 126* 130* 130* 130* 131*  K 3.7  < > 4.0 4.0 4.1 4.1 4.1  CL 84*  < > 96* 100* 98* 102 99*  CO2 23  < > 22 26 26 23 25   GLUCOSE 114*  < > 109* 127* 114* 138* 125*  BUN 15  < > 19 19 20  21* 22*  CREATININE 0.74  < > 0.81 0.91 1.00 0.92 0.91  CALCIUM 8.4*  < > 8.8* 9.0 9.1 9.1 8.9  MG 1.8  --   --   --   --   --   --   < > = values in this interval not displayed. Liver Function Tests:  Recent Labs Lab 01/03/17 0311  AST 54*  ALT 54  ALKPHOS 82  BILITOT 1.3*  PROT 7.3  ALBUMIN 4.5   CBC:  Recent Labs Lab 01/03/17 0220 01/03/17 0242 01/04/17 0117  WBC 12.3*  --  12.4*  NEUTROABS 9.2*  --   --   HGB 15.0 16.0 13.7  HCT 41.2 47.0 38.7*  MCV 91.8  --  95.1  PLT 223  --  211   Cardiac Enzymes:  Recent Labs Lab 01/03/17 0600 01/03/17 1055 01/03/17 1740  TROPONINI 0.06* 0.08*  0.08*   BNP: Invalid input(s): POCBNP CBG:  Recent Labs Lab 01/03/17 0752 01/04/17 0737 01/05/17 0736 01/06/17 0736 01/07/17 0737  GLUCAP 131* 85 94 92 101*   Urinalysis    Component Value Date/Time   COLORURINE YELLOW 01/03/2017 [place] 01/03/2017 0633   LABSPEC 1.020 01/03/2017 0633   PHURINE 5.0 01/03/2017 0633   GLUCOSEU 50 (A) 01/03/2017 0633   HGBUR NEGATIVE 01/03/2017 0633   BILIRUBINUR NEGATIVE 01/03/2017 0633   KETONESUR 20 (A) 01/03/2017 0633   PROTEINUR 30 (A) 01/03/2017 0633   NITRITE NEGATIVE 01/03/2017 0633   LEUKOCYTESUR NEGATIVE 01/03/2017 8786    Time coordinating discharge: Over 30 minutes  SIGNED:   Cordelia Poche, MD [facility] 01/07/2017, 1:08 PM Pager [phone]  If 7PM-7AM, please contact night-coverage [email or web address] Password [email or web address]

## 2017-01-08 DIAGNOSIS — N5089 Other specified disorders of the male genital organs: Secondary | ICD-10-CM | POA: Diagnosis not present

## 2017-01-08 DIAGNOSIS — E871 Hypo-osmolality and hyponatremia: Secondary | ICD-10-CM | POA: Diagnosis not present

## 2017-01-09 ENCOUNTER — Encounter: Payer: Self-pay | Admitting: Internal Medicine

## 2017-01-09 ENCOUNTER — Telehealth: Payer: Self-pay | Admitting: Internal Medicine

## 2017-01-09 NOTE — Telephone Encounter (Signed)
I called and spoke with the patient's wife and advised her that according to the patient's hospital records, the propafenone was stopped by cardiology due to his a-fib. He was previously on this for NSVT. He was discharged on metoprolol.  I advised Mrs. Matheson that the patient should stay off his propafenone for now. He has follow up with Dr. Caryl Comes on 01/16/17 and this can be discussed further at that time.

## 2017-01-09 NOTE — Telephone Encounter (Signed)
New message   Pt wife is calling to find out if pt should of been taken off of his propafenone 225mg . Pt wife is concerned about the hospital d/cing this medication and wants to know if Dr. Caryl Comes is ok with it.

## 2017-01-16 ENCOUNTER — Encounter: Payer: Self-pay | Admitting: Internal Medicine

## 2017-01-16 ENCOUNTER — Ambulatory Visit (INDEPENDENT_AMBULATORY_CARE_PROVIDER_SITE_OTHER): Payer: 59 | Admitting: Internal Medicine

## 2017-01-16 VITALS — BP 142/90 | HR 69 | Ht 70.0 in | Wt 199.8 lb

## 2017-01-16 DIAGNOSIS — I471 Supraventricular tachycardia: Secondary | ICD-10-CM | POA: Diagnosis not present

## 2017-01-16 DIAGNOSIS — I491 Atrial premature depolarization: Secondary | ICD-10-CM

## 2017-01-16 DIAGNOSIS — I4719 Other supraventricular tachycardia: Secondary | ICD-10-CM

## 2017-01-16 DIAGNOSIS — I493 Ventricular premature depolarization: Secondary | ICD-10-CM | POA: Diagnosis not present

## 2017-01-16 DIAGNOSIS — I440 Atrioventricular block, first degree: Secondary | ICD-10-CM | POA: Diagnosis not present

## 2017-01-16 DIAGNOSIS — E871 Hypo-osmolality and hyponatremia: Secondary | ICD-10-CM

## 2017-01-16 NOTE — Patient Instructions (Signed)
Medication Instructions: Your physician has recommended you make the following change in your medication:  -1) INCREASE Furosemide 40 mg - Take 2 tablets (40 mg) by mouth daily   Labwork: Your physician recommends that you have lab work today: BMET   Procedures/Testing: None Ordered  Follow-Up: Your physician recommends that you schedule a follow-up appointment with your Primary Care Doctor as soon as possible.   Any Additional Special Instructions Will Be Listed Below (If Applicable).     If you need a refill on your cardiac medications before your next appointment, please call your pharmacy.

## 2017-01-16 NOTE — Progress Notes (Signed)
Patient Care Team: Gregor Hams, FNP as PCP - General (Family Medicine)   HPI  [name] is a 83 y.o. male Seen in followup for ventricular ectopy associated with functional bradycardia;  he has been treated with propafenone.  He is having no dizziness or metallic taste    The patient denies chest pain, shortness of breath, nocturnal dyspnea, orthopnea or peripheral edema.  There have been no palpitations, lightheadedness or syncope.    He was admitted 4/18 for hyponatremia. These notes were reviewed. It was felt to be due to a combination of Ace inhibitors and thiazide diuretics.  He was discharged on furosemide 20 mg.   His propafenone was held in the hospital for reasons that are not clear.  He was felt to be in atrial fibrillation in the hospital.  The issue of anticoagulation was raised again and it was felt because of falls that he was not a candidate.  Echocardiogram 4/18 EF 60-65% with biatrial enlargement   Date Propafenone QRS PR  10/13 225 112 240  4/15 225 100   7/16 225 116 296  11/17 225 108 300  5/18 0 100 280        Admission weight the hospital was 199.   His energy is somewhat improved since discharge. His mental status is not back to baseline according to his wife. He's having significant peripheral edema and erythema over his lower extremities.  Past Medical History:  Diagnosis Date  . Arthritis   . Bladder cancer ([place])   . CAD (coronary artery disease)    cath 02/28/11: dLM 10%, pLAD 20%, pRCA calcified nodule 50% (FFR 0.86 - not significant), mRCA 40-50%, mildly elevated filling pressures, EF 50-55%  . Cataract   . Chronic kidney disease   . Hard of hearing   . Hyperlipidemia   . Hypertension    LOV with EKG 06/11/11 Dr Caryl Comes  [facility]  . Prostate cancer ([place])    status post brachytherapy---in 2003  . Sleep apnea    STOP BANG SCORE 4  . Ventricular tachycardia, nonsustained/bigeminy    echo 6/12: EF 55%, basal inferolat AK, mild  LVH    Past Surgical History:  Procedure Laterality Date  . APPENDECTOMY    . CARDIAC CATHETERIZATION  6/12  . CYSTOSCOPY  05/20/2012   Procedure: CYSTOSCOPY;  Surgeon: Alexis Frock, MD;  Location: [facility];  Service: Urology;  Laterality: N/A;  . CYSTOSCOPY  07/01/2012   Procedure: CYSTOSCOPY;  Surgeon: Molli Hazard, MD;  Location: [facility];  Service: Urology;  Laterality: N/A;  . EYE SURGERY     left cataract extraction with IOL  . SKIN CANCER EXCISION     x 4  . TRANSURETHRAL RESECTION OF BLADDER TUMOR  05/20/2012   Procedure: TRANSURETHRAL RESECTION OF BLADDER TUMOR (TURBT);  Surgeon: Alexis Frock, MD;  Location: [facility];  Service: Urology;  Laterality: N/A;  [place]   . TRANSURETHRAL RESECTION OF BLADDER TUMOR  07/01/2012   Procedure: TRANSURETHRAL RESECTION OF BLADDER TUMOR (TURBT);  Surgeon: Molli Hazard, MD;  Location: [facility];  Service: Urology;  Laterality: N/A;  Cystoscopy, TURBT, be prepared for Open Cystorraphy        Current Outpatient Prescriptions  Medication Sig Dispense Refill  . acetaminophen (TYLENOL) 500 MG tablet Take 500 mg by mouth every 6 (six) hours as needed (pain).     Marland Kitchen aspirin EC 81 MG tablet Take 81 mg by mouth.    Marland Kitchen atorvastatin (LIPITOR)  20 MG tablet Take 20 mg by mouth every evening.     . dorzolamide-timolol (COSOPT) 22.3-6.8 MG/ML ophthalmic solution Place 1 drop into the right eye 2 (two) times daily.    . DUREZOL 0.05 % EMUL 1 DROP IN RIGHT EYE THREE TIMES A DAY  1  . furosemide (LASIX) 20 MG tablet Take 1 tablet (20 mg total) by mouth daily. 30 tablet 0  . metoprolol tartrate (LOPRESSOR) 25 MG tablet Take 1 tablet (25 mg total) by mouth 2 (two) times daily. 60 tablet 0  . Multiple Vitamin (MULTIVITAMIN WITH MINERALS) TABS Take 1 tablet by mouth daily.    Marland Kitchen neomycin-polymyxin b-dexamethasone (MAXITROL) 3.5-10000-0.1 OINT as directed. 0.25 inch to left eye at bedtime    . Omega-3 1000 MG CAPS Take 1,000 mg by mouth 2 (two) times  daily.     . polyethylene glycol (MIRALAX / GLYCOLAX) packet Take 17 g by mouth daily.     . prednisoLONE acetate (PRED FORTE) 1 % ophthalmic suspension Place 1 drop into both eyes 3 (three) times daily.    . Probiotic Product (PHILLIPS COLON HEALTH PO) Take 1 capsule by mouth daily.     . sodium chloride (MURO 128) 2 % ophthalmic solution Place 1 drop into the right eye 3 (three) times daily.     Marland Kitchen zinc gluconate 50 MG tablet Take 50 mg by mouth daily.     No current facility-administered medications for this visit.     Allergies  Allergen Reactions  . Neosporin [Neomycin-Bacitracin Zn-Polymyx] Other (See Comments)    Makes his skin red   . Penicillins Hives and Itching  . Penicillin G Rash    Review of Systems negative except from HPI and PMH  Physical Exam BP (!) 142/90   Pulse 69   Ht 5\' 10"  (1.778 m)   Wt 199 lb 12.8 oz (90.6 kg)   SpO2 96%   BMI 28.67 kg/m  Well developed and well nourished in no acute distress HENT normal E scleral and icterus clear Neck Supple JVP 9-10 ; carotids brisk and full Clear to ausculation  Regular rate and rhythm, no murmurs gallops or rub Soft with active bowel sounds No clubbing cyanosis  1-2+ Edema  Overlying erythema Alert and oriented, grossly normal motor and sensory function Skin Warm and Dry malodoruos  ECG Personally reviewed    SR 76 28/10//44 Axis -5r  Assessment and  Plan  PVC  PACs/Atrial Tach  1 AVB   Hypertension  Hyponatremia   HFpEF    He is significantly volume overloaded. He was significantly hyponatremic at hospital and so up titration of diuretics is of some concern as it may aggravate this. It is not clear from the family as to whether his PCP checked his sodium at his most recent visit. His wife thinks not. We will check it today. I've advised that they see their PCP next week for recheck as we will be increasing his furosemide from 20--40.  He is at some risk for cellulitis over his lower  extremities. His PCP will need to follow this.  I do not see evidence of atrial fibrillation on the heart tracings from the hospital. He does have frequent ectopy and a significant first degree AV block. His P-wave is often lost the antecedent T-wave.  His propafenone was discontinued. At this juncture, I don't see any need to resume it.  More than 50% of 45 min was spent in counseling related to the above

## 2017-01-17 LAB — BASIC METABOLIC PANEL
BUN/Creatinine Ratio: 15 (ref 10–24)
BUN: 15 mg/dL (ref 8–27)
CHLORIDE: 90 mmol/L — AB (ref 96–106)
CO2: 27 mmol/L (ref 18–29)
CREATININE: 1.03 mg/dL (ref 0.76–1.27)
Calcium: 9.3 mg/dL (ref 8.6–10.2)
GFR calc non Af Amer: 65 mL/min/{1.73_m2} (ref >59)
GFR, EST AFRICAN AMERICAN: 75 mL/min/{1.73_m2} (ref >59)
Glucose: 98 mg/dL (ref 65–99)
Potassium: 4.4 mmol/L (ref 3.5–5.2)
Sodium: 132 mmol/L — ABNORMAL LOW (ref 134–144)

## 2017-01-21 DIAGNOSIS — R5381 Other malaise: Secondary | ICD-10-CM | POA: Diagnosis not present

## 2017-01-21 DIAGNOSIS — E871 Hypo-osmolality and hyponatremia: Secondary | ICD-10-CM | POA: Diagnosis not present

## 2017-01-21 DIAGNOSIS — D72829 Elevated white blood cell count, unspecified: Secondary | ICD-10-CM | POA: Diagnosis not present

## 2017-01-21 DIAGNOSIS — C679 Malignant neoplasm of bladder, unspecified: Secondary | ICD-10-CM | POA: Diagnosis not present

## 2017-01-21 DIAGNOSIS — I4891 Unspecified atrial fibrillation: Secondary | ICD-10-CM | POA: Diagnosis not present

## 2017-01-24 DIAGNOSIS — R5381 Other malaise: Secondary | ICD-10-CM | POA: Diagnosis not present

## 2017-01-24 DIAGNOSIS — D72829 Elevated white blood cell count, unspecified: Secondary | ICD-10-CM | POA: Diagnosis not present

## 2017-01-24 DIAGNOSIS — E871 Hypo-osmolality and hyponatremia: Secondary | ICD-10-CM | POA: Diagnosis not present

## 2017-02-06 DIAGNOSIS — E871 Hypo-osmolality and hyponatremia: Secondary | ICD-10-CM | POA: Diagnosis not present

## 2017-02-06 DIAGNOSIS — R5383 Other fatigue: Secondary | ICD-10-CM | POA: Diagnosis not present

## 2017-02-06 DIAGNOSIS — I10 Essential (primary) hypertension: Secondary | ICD-10-CM | POA: Diagnosis not present

## 2017-02-06 DIAGNOSIS — I2510 Atherosclerotic heart disease of native coronary artery without angina pectoris: Secondary | ICD-10-CM | POA: Diagnosis not present

## 2017-02-06 DIAGNOSIS — R413 Other amnesia: Secondary | ICD-10-CM | POA: Diagnosis not present

## 2017-02-07 DIAGNOSIS — I480 Paroxysmal atrial fibrillation: Secondary | ICD-10-CM | POA: Diagnosis not present

## 2017-02-07 DIAGNOSIS — I2510 Atherosclerotic heart disease of native coronary artery without angina pectoris: Secondary | ICD-10-CM | POA: Diagnosis not present

## 2017-02-07 DIAGNOSIS — I472 Ventricular tachycardia: Secondary | ICD-10-CM | POA: Diagnosis not present

## 2017-02-07 DIAGNOSIS — I440 Atrioventricular block, first degree: Secondary | ICD-10-CM | POA: Diagnosis not present

## 2017-05-26 ENCOUNTER — Encounter (INDEPENDENT_AMBULATORY_CARE_PROVIDER_SITE_OTHER): Payer: Self-pay

## 2017-05-26 ENCOUNTER — Ambulatory Visit (INDEPENDENT_AMBULATORY_CARE_PROVIDER_SITE_OTHER): Payer: Medicare Other | Admitting: Neurology

## 2017-05-26 ENCOUNTER — Encounter: Payer: Self-pay | Admitting: Neurology

## 2017-05-26 VITALS — BP 155/82 | HR 78 | Ht 70.0 in | Wt 190.0 lb

## 2017-05-26 DIAGNOSIS — R269 Unspecified abnormalities of gait and mobility: Secondary | ICD-10-CM

## 2017-05-26 DIAGNOSIS — E538 Deficiency of other specified B group vitamins: Secondary | ICD-10-CM | POA: Diagnosis not present

## 2017-05-26 DIAGNOSIS — R413 Other amnesia: Secondary | ICD-10-CM

## 2017-05-26 DIAGNOSIS — Z5181 Encounter for therapeutic drug level monitoring: Secondary | ICD-10-CM

## 2017-05-26 DIAGNOSIS — R5382 Chronic fatigue, unspecified: Secondary | ICD-10-CM

## 2017-05-26 HISTORY — DX: Other amnesia: R41.3

## 2017-05-26 HISTORY — DX: Unspecified abnormalities of gait and mobility: R26.9

## 2017-05-26 NOTE — Progress Notes (Signed)
Reason for visit: Memory disturbance  Referring physician: Dr. Stephens Shire T [name] is a 83 y.o. male  History of present illness:  [name] is an 83 year old right-handed white male with a history of a chronic gait disorder that has been present for about 5 years, gradually getting worse. The patient went to the hospital around 01/03/2017 with hyponatremia with sodium levels as low as 114. The hyponatremia was thought secondary to diuretics. The patient has had correction of the hyponatremia, but the confusion that has been present since the onset of the hyponatremia has not fully cleared. The patient has been unable to manage the finances, he has needed help keeping up with appointments. He still manages his own medications, he does not operate a motor vehicle in part secondary to visual problems. The patient has chronic fatigue issues, he claims he sleeps fairly well at night. The patient does have some urinary frequency, he has a history of bladder cancer. The patient reports no numbness or weakness of extremities, he denies headaches or dizziness. The wife indicates that he has significant short-term memory issues, he will forget things that were told him only 15 or 20 minutes previously. The patient is sent to this office for further evaluation. A CT scan of the brain done in April shows no significant small vessel ischemic changes, with a remarkable lack of atrophy for age.   Past Medical History:  Diagnosis Date  . Arthritis   . Bladder cancer ([place])   . CAD (coronary artery disease)    cath 02/28/11: dLM 10%, pLAD 20%, pRCA calcified nodule 50% (FFR 0.86 - not significant), mRCA 40-50%, mildly elevated filling pressures, EF 50-55%  . Cataract   . Chronic kidney disease   . Hard of hearing   . Hyperlipidemia   . Hypertension    LOV with EKG 06/11/11 Dr Caryl Comes  [facility]  . Prostate cancer ([place])    status post brachytherapy---in 2003  . Sleep apnea    STOP BANG SCORE 4  .  Ventricular tachycardia, nonsustained/bigeminy    echo 6/12: EF 55%, basal inferolat AK, mild LVH    Past Surgical History:  Procedure Laterality Date  . APPENDECTOMY    . CARDIAC CATHETERIZATION  6/12  . CYSTOSCOPY  05/20/2012   Procedure: CYSTOSCOPY;  Surgeon: Alexis Frock, MD;  Location: [facility];  Service: Urology;  Laterality: N/A;  . CYSTOSCOPY  07/01/2012   Procedure: CYSTOSCOPY;  Surgeon: Molli Hazard, MD;  Location: [facility];  Service: Urology;  Laterality: N/A;  . EYE SURGERY     left cataract extraction with IOL  . SKIN CANCER EXCISION     x 4  . TRANSURETHRAL RESECTION OF BLADDER TUMOR  05/20/2012   Procedure: TRANSURETHRAL RESECTION OF BLADDER TUMOR (TURBT);  Surgeon: Alexis Frock, MD;  Location: [facility];  Service: Urology;  Laterality: N/A;  [place]   . TRANSURETHRAL RESECTION OF BLADDER TUMOR  07/01/2012   Procedure: TRANSURETHRAL RESECTION OF BLADDER TUMOR (TURBT);  Surgeon: Molli Hazard, MD;  Location: [facility];  Service: Urology;  Laterality: N/A;  Cystoscopy, TURBT, be prepared for Open Cystorraphy        Family History  Problem Relation Age of Onset  . Arthritis Son   . Heart Problems Son        Aortic valve replacement  . Lung cancer Daughter   . Heart attack Neg Hx   . Stroke Neg Hx   . Hypertension Neg Hx     Social  history:  reports that he quit smoking about 22 years ago. His smoking use included Cigars. His smokeless tobacco use includes Chew. He reports that he drinks alcohol. He reports that he does not use drugs.  Medications:  Prior to Admission medications   Medication Sig Start Date End Date Taking? Authorizing Provider  acetaminophen (TYLENOL) 500 MG tablet Take 500 mg by mouth every 6 (six) hours as needed (pain).    Yes [provider]  aspirin EC 81 MG tablet Take 81 mg by mouth.   Yes [provider]  atorvastatin (LIPITOR) 20 MG tablet Take 20 mg by mouth every evening.    Yes [provider]    dorzolamide-timolol (COSOPT) 22.3-6.8 MG/ML ophthalmic solution Place 1 drop into the right eye 2 (two) times daily. 04/19/16  Yes [provider]  furosemide (LASIX) 20 MG tablet Take 1 tablet (20 mg total) by mouth daily. 01/07/17  Yes Mariel Aloe, MD  Multiple Vitamin (MULTIVITAMIN WITH MINERALS) TABS Take 1 tablet by mouth daily.   Yes [provider]  Omega-3 1000 MG CAPS Take 1,000 mg by mouth 2 (two) times daily.    Yes [provider]  prednisoLONE acetate (PRED FORTE) 1 % ophthalmic suspension Place 1 drop into both eyes 3 (three) times daily.   Yes [provider]  Probiotic Product ([place]) Take 1 capsule by mouth daily.    Yes [provider]  zinc gluconate 50 MG tablet Take 50 mg by mouth daily.   Yes [provider]      Allergies  Allergen Reactions  . Neosporin [Neomycin-Bacitracin Zn-Polymyx] Other (See Comments)    Makes his skin red   . Penicillins Hives and Itching  . Penicillin G Rash    ROS:  Out of a complete 14 system review of symptoms, the patient complains only of the following symptoms, and all other reviewed systems are negative.  Fatigue Memory loss, confusion Too much sleep, decreased energy, change in appetite, disinterest in activities  Blood pressure (!) 155/82, pulse 78, height 5\' 10"  (1.778 m), weight 190 lb (86.2 kg).  Physical Exam  General: The patient is alert and cooperative at the time of the examination.  Eyes: Pupils are equal, round, and reactive to light. Discs are flat bilaterally.  Neck: The neck is supple, no carotid bruits are noted.  Respiratory: The respiratory examination is clear.  Cardiovascular: The cardiovascular examination reveals a regular rate and rhythm, no obvious murmurs or rubs are noted.  Skin: Extremities are without significant edema.  Neurologic Exam  Mental status: The patient is alert and oriented x 2 at the time of the  examination (not oriented to date). The Mini-Mental Status Examination done today shows a total score of 20/30.  Cranial nerves: Facial symmetry is present. There is good sensation of the face to pinprick and soft touch bilaterally. The strength of the facial muscles and the muscles to head turning and shoulder shrug are normal bilaterally. Speech is well enunciated, no aphasia or dysarthria is noted. Extraocular movements are full. Visual fields are full. The tongue is midline, and the patient has symmetric elevation of the soft palate. No obvious hearing deficits are noted.  Motor: The motor testing reveals 5 over 5 strength of all 4 extremities. Good symmetric motor tone is noted throughout.  Sensory: Sensory testing is intact to pinprick, soft touch, vibration sensation, and position sense on all 4 extremities, with exception of a decrease in position sense  in both feet. No stocking pattern pinprick sensory deficit was noted in the lower extremities.. No evidence of extinction is noted.  Coordination: Cerebellar testing reveals good finger-nose-finger and heel-to-shin bilaterally.  Gait and station: Gait is wide-based, unsteady.tandem gait was not attempted. Romberg is negative. No drift is seen.  Reflexes: Deep tendon reflexes are symmetric, but are depressed bilaterally. Toes are downgoing bilaterally.   CT head 01/03/17:  IMPRESSION: No acute intracranial abnormalities. Mild chronic atrophy and small vessel ischemic changes.  * CT scan images were reviewed online. I agree with the written report.    Assessment/Plan:  1. Gait disorder  2. History of hyponatremia  3. Memory disturbance  According to the family's history, the patient had a sudden change in memory that occurred in April 2018 associated with an event of significant hyponatremia. The patient had a CT scan of the brain at that time that was relatively unremarkable. He will be sent back for MRI of the brain. The  patient will undergo blood work today. The patient does have a significant gait disorder, he has decreased position sense in both feet. He will follow-up in 3-4 months.   Jill Alexanders MD 05/26/2017 2:07 PM  [facility] [address] [place], [place] [address]  Phone [phone] Fax [phone]

## 2017-05-26 NOTE — Patient Instructions (Signed)
    We will check blood work today and get MRI of the brain. 

## 2017-05-28 ENCOUNTER — Telehealth: Payer: Self-pay | Admitting: *Deleted

## 2017-05-28 LAB — COMPREHENSIVE METABOLIC PANEL
A/G RATIO: 2.1 (ref 1.2–2.2)
ALT: 16 IU/L (ref 0–44)
AST: 20 IU/L (ref 0–40)
Albumin: 4.5 g/dL (ref 3.5–4.7)
Alkaline Phosphatase: 102 IU/L (ref 39–117)
BUN/Creatinine Ratio: 14 (ref 10–24)
BUN: 14 mg/dL (ref 8–27)
Bilirubin Total: 0.8 mg/dL (ref 0.0–1.2)
CO2: 23 mmol/L (ref 20–29)
CREATININE: 0.97 mg/dL (ref 0.76–1.27)
Calcium: 9.3 mg/dL (ref 8.6–10.2)
Chloride: 96 mmol/L (ref 96–106)
GFR, EST AFRICAN AMERICAN: 80 mL/min/{1.73_m2} (ref >59)
GFR, EST NON AFRICAN AMERICAN: 69 mL/min/{1.73_m2} (ref >59)
GLOBULIN, TOTAL: 2.1 g/dL (ref 1.5–4.5)
GLUCOSE: 114 mg/dL — AB (ref 65–99)
Potassium: 4.6 mmol/L (ref 3.5–5.2)
SODIUM: 139 mmol/L (ref 134–144)
TOTAL PROTEIN: 6.6 g/dL (ref 6.0–8.5)

## 2017-05-28 LAB — TSH: TSH: 2.84 u[IU]/mL (ref 0.450–4.500)

## 2017-05-28 LAB — RPR: RPR Ser Ql: NONREACTIVE

## 2017-05-28 LAB — COPPER, SERUM: Copper: 103 ug/dL (ref 72–166)

## 2017-05-28 LAB — VITAMIN B12: VITAMIN B 12: 540 pg/mL (ref 232–1245)

## 2017-05-28 LAB — SEDIMENTATION RATE: SED RATE: 2 mm/h (ref 0–30)

## 2017-05-28 NOTE — Telephone Encounter (Signed)
Called and spoke with patient about unremarkable labs per CW,MD note. He verbalized understanding. 

## 2017-05-28 NOTE — Telephone Encounter (Signed)
-----   Message from Kathrynn Ducking, MD sent at 05/28/2017  7:23 AM EDT -----   The blood work results are unremarkable. Please call the patient.  ----- Message ----- From: Lavone Neri Lab Results In Sent: 05/27/2017   7:41 AM To: Kathrynn Ducking, MD

## 2017-06-03 ENCOUNTER — Ambulatory Visit: Payer: 59 | Admitting: Physician Assistant

## 2017-06-03 ENCOUNTER — Telehealth: Payer: Self-pay

## 2017-06-03 NOTE — Telephone Encounter (Signed)
SENT NOTES TO SCHEDULING 

## 2017-06-05 ENCOUNTER — Telehealth: Payer: Self-pay | Admitting: Neurology

## 2017-06-05 NOTE — Telephone Encounter (Signed)
Pt wife is asking for a call re: the MRI that is supposed to be scheduled, she is asking for a call back after 3pm

## 2017-06-05 NOTE — Telephone Encounter (Signed)
Spoke to the patients wife and informed her that [place] try to reach out to her on 05/28/17.. She stated she never go the message. I did give her the number of [phone]. She stated she will call later.

## 2017-06-19 ENCOUNTER — Ambulatory Visit
Admission: RE | Admit: 2017-06-19 | Discharge: 2017-06-19 | Disposition: A | Discharge status: Home / Self Care | Payer: Medicare Other | Source: Ambulatory Visit | Attending: Neurology | Admitting: Neurology

## 2017-06-19 DIAGNOSIS — R413 Other amnesia: Secondary | ICD-10-CM | POA: Diagnosis not present

## 2017-06-19 DIAGNOSIS — R269 Unspecified abnormalities of gait and mobility: Secondary | ICD-10-CM

## 2017-06-20 ENCOUNTER — Telehealth: Payer: Self-pay | Admitting: Neurology

## 2017-06-20 MED ORDER — DONEPEZIL HCL 5 MG PO TABS: 5.0000 mg | ORAL_TABLET | Freq: Every day | ORAL | 1 refills | Status: DC

## 2017-06-20 NOTE — Telephone Encounter (Signed)
I called the patient, talk with the wife. The MRI does show generalized atrophy, minimal small vessel disease was noted.  We will start Aricept at this time, the patient will follow-up in January.   MRI brain 06/20/17:  IMPRESSION:  This MRI of the brain without contrast shows the following: 1.   Mild to moderate generalized cortical atrophy that is most pronounced in the mesial temporal lobes. 2.   Four chronic microhemorrhages are noted, one in the cerebellar vermis and 3 in the subcortical or deep white matter of the left hemisphere.   The etiology is not clear and they could be incidental or represent minimal early cerebral amyloid angiopathy.    The pattern is not consistent with hypertensive microangiopathy. 3.   Minimal age-appropriate chronic microvascular ischemic change. 4.   Mild maxillary and ethmoid chronic sinusitis.

## 2017-08-15 ENCOUNTER — Other Ambulatory Visit: Payer: Self-pay | Admitting: Neurology

## 2017-10-08 ENCOUNTER — Ambulatory Visit: Payer: Medicare Other | Admitting: Adult Health

## 2017-10-08 ENCOUNTER — Encounter: Payer: Self-pay | Admitting: Adult Health

## 2017-10-08 ENCOUNTER — Ambulatory Visit: Payer: Medicare HMO | Admitting: Adult Health

## 2017-10-08 VITALS — BP 106/72 | HR 84 | Wt 192.8 lb

## 2017-10-08 DIAGNOSIS — R269 Unspecified abnormalities of gait and mobility: Secondary | ICD-10-CM | POA: Diagnosis not present

## 2017-10-08 DIAGNOSIS — R413 Other amnesia: Secondary | ICD-10-CM | POA: Diagnosis not present

## 2017-10-08 MED ORDER — DONEPEZIL HCL 10 MG PO TABS: ORAL_TABLET | ORAL | 11 refills | Status: DC

## 2017-10-08 NOTE — Progress Notes (Signed)
I have read the note, and I agree with the clinical assessment and plan.  Eban Weick K Katurah Karapetian   

## 2017-10-08 NOTE — Progress Notes (Signed)
PATIENT: [name] Keep DOB: 08-25-1927  REASON FOR VISIT: follow up HISTORY FROM: patient  HISTORY OF PRESENT ILLNESS: Today 10/08/17 [name] is an 84 year old male with a history of gait disorder and memory disturbance.  He returns today for follow-up.  At the last visit he was placed on Aricept.  He reports that he is tolerating the medication well.  He feels that his memory has remained stable.  He is able to complete all ADLs independently.  Does report at times he needs some prompting.  The patient's wife prepares all his medication for him.  He denies any change in his appetite.  Denies hallucinations.  Denies any significant agitation.  He does sleep quite a bit.  Typically goes to bed around midnight and will not get up until 3 PM the next day.  However he does get up and down during that time.  Patient reports no significant change in his gait or balance.  He has a cane and walker but does not use them.  He has not had any recent falls.  He returns today for an evaluation.  HISTORY [name] is an 84 year old right-handed white male with a history of a chronic gait disorder that has been present for about 5 years, gradually getting worse. The patient went to the hospital around 01/03/2017 with hyponatremia with sodium levels as low as 114. The hyponatremia was thought secondary to diuretics. The patient has had correction of the hyponatremia, but the confusion that has been present since the onset of the hyponatremia has not fully cleared. The patient has been unable to manage the finances, he has needed help keeping up with appointments. He still manages his own medications, he does not operate a motor vehicle in part secondary to visual problems. The patient has chronic fatigue issues, he claims he sleeps fairly well at night. The patient does have some urinary frequency, he has a history of bladder cancer. The patient reports no numbness or weakness of extremities, he denies headaches  or dizziness. The wife indicates that he has significant short-term memory issues, he will forget things that were told him only 15 or 20 minutes previously. The patient is sent to this office for further evaluation. A CT scan of the brain done in April shows no significant small vessel ischemic changes, with a remarkable lack of atrophy for age.   REVIEW OF SYSTEMS: Out of a complete 14 system review of symptoms, the patient complains only of the following symptoms, and all other reviewed systems are negative.  See HPI  ALLERGIES: Allergies  Allergen Reactions  . Neosporin [Neomycin-Bacitracin Zn-Polymyx] Other (See Comments)    Makes his skin red   . Penicillins Hives and Itching  . Penicillin G Rash    HOME MEDICATIONS: Outpatient Medications Prior to Visit  Medication Sig Dispense Refill  . acetaminophen (TYLENOL) 500 MG tablet Take 500 mg by mouth every 6 (six) hours as needed (pain).     Marland Kitchen aspirin EC 81 MG tablet Take 81 mg by mouth.    Marland Kitchen atorvastatin (LIPITOR) 20 MG tablet Take 20 mg by mouth every evening.     . donepezil (ARICEPT) 5 MG tablet TAKE 1 TABLET BY MOUTH EVERYDAY AT BEDTIME 30 tablet 1  . Multiple Vitamin (MULTIVITAMIN WITH MINERALS) TABS Take 1 tablet by mouth daily.    . Omega-3 1000 MG CAPS Take 1,000 mg by mouth 2 (two) times daily.     . prednisoLONE acetate (PRED FORTE) 1 %  ophthalmic suspension Place 1 drop into both eyes 3 (three) times daily.    . Probiotic Product ([place]IPS COLON HEALTH PO) Take 1 capsule by mouth daily.     Marland Kitchen zinc gluconate 50 MG tablet Take 50 mg by mouth daily.    . furosemide (LASIX) 20 MG tablet Take 1 tablet (20 mg total) by mouth daily. (Patient not taking: Reported on 10/08/2017) 30 tablet 0  . dorzolamide-timolol (COSOPT) 22.3-6.8 MG/ML ophthalmic solution Place 1 drop into the right eye 2 (two) times daily.     No facility-administered medications prior to visit.     PAST MEDICAL HISTORY: Past Medical History:  Diagnosis Date   . Arthritis   . Bladder cancer ([place])   . CAD (coronary artery disease)    cath 02/28/11: dLM 10%, pLAD 20%, pRCA calcified nodule 50% (FFR 0.86 - not significant), mRCA 40-50%, mildly elevated filling pressures, EF 50-55%  . Cataract   . Chronic kidney disease   . Gait abnormality 05/26/2017  . Hard of hearing   . Hyperlipidemia   . Hypertension    LOV with EKG 06/11/11 Dr Caryl Comes  [facility]  . Memory difficulty 05/26/2017  . Prostate cancer ([place])    status post brachytherapy---in 2003  . Sleep apnea    STOP BANG SCORE 4  . Ventricular tachycardia, nonsustained/bigeminy    echo 6/12: EF 55%, basal inferolat AK, mild LVH    PAST SURGICAL HISTORY: Past Surgical History:  Procedure Laterality Date  . APPENDECTOMY    . CARDIAC CATHETERIZATION  6/12  . CYSTOSCOPY  05/20/2012   Procedure: CYSTOSCOPY;  Surgeon: Alexis Frock, MD;  Location: [facility];  Service: Urology;  Laterality: N/A;  . CYSTOSCOPY  07/01/2012   Procedure: CYSTOSCOPY;  Surgeon: Molli Hazard, MD;  Location: [facility];  Service: Urology;  Laterality: N/A;  . EYE SURGERY     left cataract extraction with IOL  . SKIN CANCER EXCISION     x 4  . TRANSURETHRAL RESECTION OF BLADDER TUMOR  05/20/2012   Procedure: TRANSURETHRAL RESECTION OF BLADDER TUMOR (TURBT);  Surgeon: Alexis Frock, MD;  Location[facility];  Service: Urology;  Laterality: N/A;  [place]   . TRANSURETHRAL RESECTION OF BLADDER TUMOR  07/01/2012   Procedure: TRANSURETHRAL RESECTION OF BLADDER TUMOR (TURBT);  Surgeon: Molli Hazard, MD;  Location: [facility];  Service: Urology;  Laterality: N/A;  Cystoscopy, TURBT, be prepared for Open Cystorraphy        FAMILY HISTORY: Family History  Problem Relation Age of Onset  . Arthritis Son   . Heart Problems Son        Aortic valve replacement  . Lung cancer Daughter   . Heart attack Neg Hx   . Stroke Neg Hx   . Hypertension Neg Hx     SOCIAL HISTORY: Social History   Socioeconomic History  . Marital  status: Married    Spouse name: [name] "Romie Minus"  . Number of children: 2  . Years of education: some college  . Highest education level: Not on file  Social Needs  . Financial resource strain: Not on file  . Food insecurity - worry: Not on file  . Food insecurity - inability: Not on file  . Transportation needs - medical: Not on file  . Transportation needs - non-medical: Not on file  Occupational History  . Occupation: Retired  Tobacco Use  . Smoking status: Former Smoker    Types: Cigars    Last attempt to quit: 05/20/1995  Years since quitting: 22.4  . Smokeless tobacco: Current User    Types: Chew  Substance and Sexual Activity  . Alcohol use: Yes    Comment: 6 beer week  . Drug use: No  . Sexual activity: Not on file  Other Topics Concern  . Not on file  Social History Narrative   He lives at home with his wife. He is remarried.    He lost his daughter at age of 73 to lung cancer. He does not smoke.    Uses alcohol occasionally.    Denies use of recreational drugs   Caffeine use: Drinks cofe (caffeine free)   1 cup coffee per morning (decaf)   Right handed      PHYSICAL EXAM  Vitals:   10/08/17 1453  BP: 106/72  Pulse: 84  Weight: 192 lb 12.8 oz (87.5 kg)   Body mass index is 27.66 kg/m.   MMSE - Mini Mental State Exam 10/08/2017 05/26/2017  Orientation to time 5 2  Orientation to Place 4 4  Registration 3 3  Attention/ Calculation 3 1  Recall 3 2  Language- name 2 objects 2 2  Language- repeat 0 1  Language- follow 3 step command 3 3  Language- read & follow direction 1 1  Write a sentence 1 0  Copy design 1 1  Total score 26 20     Generalized: Well developed, in no acute distress   Neurological examination  Mentation: Alert oriented to time, place, history taking. Follows all commands speech and language fluent Cranial nerve II-XII: Pupils were equal round reactive to light. Extraocular movements were full, visual field were full on  confrontational test. Facial sensation and strength were normal. Uvula tongue midline. Head turning and shoulder shrug  were normal and symmetric. Motor: The motor testing reveals 5 over 5 strength of all 4 extremities. Good symmetric motor tone is noted throughout.  Sensory: Sensory testing is intact to soft touch on all 4 extremities. No evidence of extinction is noted.  Coordination: Cerebellar testing reveals good finger-nose-finger and heel-to-shin bilaterally.  Gait and station: Gait is slightly unsteady.  He does not use a cane or walker.  Tandem gait not attempted. Reflexes: Deep tendon reflexes are symmetric and normal bilaterally.   DIAGNOSTIC DATA (LABS, IMAGING, TESTING) - I reviewed patient records, labs, notes, testing and imaging myself where available.  Lab Results  Component Value Date   WBC 12.4 (H) 01/04/2017   HGB 13.7 01/04/2017   HCT 38.7 (L) 01/04/2017   MCV 95.1 01/04/2017   PLT 211 01/04/2017      Component Value Date/Time   NA 139 05/26/2017 1433   K 4.6 05/26/2017 1433   CL 96 05/26/2017 1433   CO2 23 05/26/2017 1433   GLUCOSE 114 (H) 05/26/2017 1433   GLUCOSE 125 (H) 01/07/2017 0636   BUN 14 05/26/2017 1433   CREATININE 0.97 05/26/2017 1433   CALCIUM 9.3 05/26/2017 1433   PROT 6.6 05/26/2017 1433   ALBUMIN 4.5 05/26/2017 1433   AST 20 05/26/2017 1433   ALT 16 05/26/2017 1433   ALKPHOS 102 05/26/2017 1433   BILITOT 0.8 05/26/2017 1433   GFRNONAA 69 05/26/2017 1433   GFRAA 80 05/26/2017 1433   Lab Results  Component Value Date   CHOL 95 01/04/2017   HDL 45 01/04/2017   LDLCALC 41 01/04/2017   TRIG 45 01/04/2017   CHOLHDL 2.1 01/04/2017   Lab Results  Component Value Date   HGBA1C 5.8 (H) 01/04/2017  Lab Results  Component Value Date   ALPFXTKW40 973 05/26/2017   Lab Results  Component Value Date   TSH 2.840 05/26/2017      ASSESSMENT AND PLAN 84 y.o. year old male  has a past medical history of Arthritis, Bladder cancer ([place]),  CAD (coronary artery disease), Cataract, Chronic kidney disease, Gait abnormality (05/26/2017), Hard of hearing, Hyperlipidemia, Hypertension, Memory difficulty (05/26/2017), Prostate cancer [facility]), Sleep apnea, and Ventricular tachycardia, nonsustained/bigeminy. here with :  1.  Memory disturbance 2.  Gait disorder  The patient's memory score has remained stable.  We will increase Aricept to 10 mg at bedtime.  He is advised that if his symptoms worsen or he develops new symptoms they should let us know.  He will follow-up in 6 months or sooner if needed.     Ward Givens, MSN, NP-C 10/08/2017, 2:54 PM [facility] [address] [place], [place] [address] [phone]

## 2017-10-08 NOTE — Patient Instructions (Signed)
Your Plan:  Memory score improved slightly  Increase Aricept 10 mg at bedtime If your symptoms worsen or you develop new symptoms please let us know.   Thank you for coming to see [place] at [facility]. I hope we have been able to provide you high quality care today.  You may receive a patient satisfaction survey over the next few weeks. We would appreciate your feedback and comments so that we may continue to improve ourselves and the health of our patients.

## 2017-10-11 ENCOUNTER — Other Ambulatory Visit: Payer: Self-pay | Admitting: Neurology

## 2017-11-05 ENCOUNTER — Other Ambulatory Visit: Payer: Self-pay | Admitting: *Deleted

## 2017-11-05 MED ORDER — DONEPEZIL HCL 10 MG PO TABS: ORAL_TABLET | ORAL | 3 refills | Status: DC

## 2017-12-05 ENCOUNTER — Emergency Department (HOSPITAL_COMMUNITY): Payer: Medicare HMO

## 2017-12-05 ENCOUNTER — Inpatient Hospital Stay (HOSPITAL_COMMUNITY)
Admission: EM | Admit: 2017-12-05 | Discharge: 2017-12-09 | DRG: 291 | Disposition: A | Discharge status: Home Health | Payer: Medicare HMO | Attending: Internal Medicine | Admitting: Internal Medicine

## 2017-12-05 ENCOUNTER — Encounter (HOSPITAL_COMMUNITY): Payer: Self-pay

## 2017-12-05 ENCOUNTER — Other Ambulatory Visit: Payer: Self-pay

## 2017-12-05 DIAGNOSIS — I480 Paroxysmal atrial fibrillation: Secondary | ICD-10-CM | POA: Diagnosis present

## 2017-12-05 DIAGNOSIS — Z8551 Personal history of malignant neoplasm of bladder: Secondary | ICD-10-CM

## 2017-12-05 DIAGNOSIS — M4855XA Collapsed vertebra, not elsewhere classified, thoracolumbar region, initial encounter for fracture: Secondary | ICD-10-CM | POA: Diagnosis present

## 2017-12-05 DIAGNOSIS — Z880 Allergy status to penicillin: Secondary | ICD-10-CM

## 2017-12-05 DIAGNOSIS — J90 Pleural effusion, not elsewhere classified: Secondary | ICD-10-CM | POA: Diagnosis present

## 2017-12-05 DIAGNOSIS — R0602 Shortness of breath: Secondary | ICD-10-CM

## 2017-12-05 DIAGNOSIS — I5023 Acute on chronic systolic (congestive) heart failure: Secondary | ICD-10-CM | POA: Diagnosis present

## 2017-12-05 DIAGNOSIS — I498 Other specified cardiac arrhythmias: Secondary | ICD-10-CM | POA: Diagnosis present

## 2017-12-05 DIAGNOSIS — Z9181 History of falling: Secondary | ICD-10-CM | POA: Diagnosis not present

## 2017-12-05 DIAGNOSIS — R413 Other amnesia: Secondary | ICD-10-CM | POA: Diagnosis present

## 2017-12-05 DIAGNOSIS — R0902 Hypoxemia: Secondary | ICD-10-CM

## 2017-12-05 DIAGNOSIS — I493 Ventricular premature depolarization: Secondary | ICD-10-CM | POA: Diagnosis present

## 2017-12-05 DIAGNOSIS — I499 Cardiac arrhythmia, unspecified: Secondary | ICD-10-CM | POA: Diagnosis not present

## 2017-12-05 DIAGNOSIS — Z7982 Long term (current) use of aspirin: Secondary | ICD-10-CM | POA: Diagnosis not present

## 2017-12-05 DIAGNOSIS — J918 Pleural effusion in other conditions classified elsewhere: Secondary | ICD-10-CM | POA: Diagnosis present

## 2017-12-05 DIAGNOSIS — F0390 Unspecified dementia without behavioral disturbance: Secondary | ICD-10-CM | POA: Diagnosis present

## 2017-12-05 DIAGNOSIS — I10 Essential (primary) hypertension: Secondary | ICD-10-CM | POA: Diagnosis present

## 2017-12-05 DIAGNOSIS — R5383 Other fatigue: Secondary | ICD-10-CM

## 2017-12-05 DIAGNOSIS — Z8249 Family history of ischemic heart disease and other diseases of the circulatory system: Secondary | ICD-10-CM

## 2017-12-05 DIAGNOSIS — I4891 Unspecified atrial fibrillation: Secondary | ICD-10-CM | POA: Diagnosis present

## 2017-12-05 DIAGNOSIS — I110 Hypertensive heart disease with heart failure: Principal | ICD-10-CM | POA: Diagnosis present

## 2017-12-05 DIAGNOSIS — J9811 Atelectasis: Secondary | ICD-10-CM | POA: Diagnosis present

## 2017-12-05 DIAGNOSIS — R008 Other abnormalities of heart beat: Secondary | ICD-10-CM | POA: Diagnosis present

## 2017-12-05 DIAGNOSIS — M7989 Other specified soft tissue disorders: Secondary | ICD-10-CM | POA: Diagnosis present

## 2017-12-05 DIAGNOSIS — J9601 Acute respiratory failure with hypoxia: Secondary | ICD-10-CM | POA: Diagnosis present

## 2017-12-05 DIAGNOSIS — Z8261 Family history of arthritis: Secondary | ICD-10-CM

## 2017-12-05 DIAGNOSIS — Z66 Do not resuscitate: Secondary | ICD-10-CM | POA: Diagnosis present

## 2017-12-05 DIAGNOSIS — F17220 Nicotine dependence, chewing tobacco, uncomplicated: Secondary | ICD-10-CM | POA: Diagnosis present

## 2017-12-05 DIAGNOSIS — E785 Hyperlipidemia, unspecified: Secondary | ICD-10-CM | POA: Diagnosis not present

## 2017-12-05 DIAGNOSIS — Z8546 Personal history of malignant neoplasm of prostate: Secondary | ICD-10-CM

## 2017-12-05 DIAGNOSIS — I2510 Atherosclerotic heart disease of native coronary artery without angina pectoris: Secondary | ICD-10-CM | POA: Diagnosis present

## 2017-12-05 DIAGNOSIS — E8770 Fluid overload, unspecified: Secondary | ICD-10-CM

## 2017-12-05 DIAGNOSIS — J9621 Acute and chronic respiratory failure with hypoxia: Secondary | ICD-10-CM | POA: Diagnosis not present

## 2017-12-05 DIAGNOSIS — C679 Malignant neoplasm of bladder, unspecified: Secondary | ICD-10-CM | POA: Diagnosis present

## 2017-12-05 DIAGNOSIS — I959 Hypotension, unspecified: Secondary | ICD-10-CM

## 2017-12-05 DIAGNOSIS — I340 Nonrheumatic mitral (valve) insufficiency: Secondary | ICD-10-CM | POA: Diagnosis not present

## 2017-12-05 DIAGNOSIS — Z801 Family history of malignant neoplasm of trachea, bronchus and lung: Secondary | ICD-10-CM

## 2017-12-05 DIAGNOSIS — R001 Bradycardia, unspecified: Secondary | ICD-10-CM | POA: Diagnosis present

## 2017-12-05 LAB — CBC WITH DIFFERENTIAL/PLATELET
BASOS ABS: 0.1 10*3/uL (ref 0.0–0.1)
Basophils Relative: 1 %
EOS PCT: 6 %
Eosinophils Absolute: 0.7 10*3/uL (ref 0.0–0.7)
HEMATOCRIT: 47.6 % (ref 39.0–52.0)
Hemoglobin: 15.3 g/dL (ref 13.0–17.0)
LYMPHS PCT: 16 %
Lymphs Abs: 1.8 10*3/uL (ref 0.7–4.0)
MCH: 32.3 pg (ref 26.0–34.0)
MCHC: 32.1 g/dL (ref 30.0–36.0)
MCV: 100.4 fL — AB (ref 78.0–100.0)
Monocytes Absolute: 1.4 10*3/uL — ABNORMAL HIGH (ref 0.1–1.0)
Monocytes Relative: 12 %
NEUTROS ABS: 7.7 10*3/uL (ref 1.7–7.7)
Neutrophils Relative %: 65 %
PLATELETS: 183 10*3/uL (ref 150–400)
RBC: 4.74 MIL/uL (ref 4.22–5.81)
RDW: 14.9 % (ref 11.5–15.5)
WBC: 11.7 10*3/uL — AB (ref 4.0–10.5)

## 2017-12-05 LAB — URINALYSIS, ROUTINE W REFLEX MICROSCOPIC
Bilirubin Urine: NEGATIVE
Glucose, UA: NEGATIVE mg/dL
HGB URINE DIPSTICK: NEGATIVE
KETONES UR: NEGATIVE mg/dL
Leukocytes, UA: NEGATIVE
NITRITE: NEGATIVE
PH: 5 (ref 5.0–8.0)
Protein, ur: 100 mg/dL — AB

## 2017-12-05 LAB — I-STAT TROPONIN, ED
TROPONIN I, POC: 0.07 ng/mL (ref 0.00–0.08)
TROPONIN I, POC: 0.06 ng/mL (ref 0.00–0.08)

## 2017-12-05 LAB — COMPREHENSIVE METABOLIC PANEL
ALBUMIN: 3.8 g/dL (ref 3.5–5.0)
ALT: 29 U/L (ref 17–63)
AST: 32 U/L (ref 15–41)
Alkaline Phosphatase: 100 U/L (ref 38–126)
Anion gap: 9 (ref 5–15)
BUN: 20 mg/dL (ref 6–20)
CHLORIDE: 97 mmol/L — AB (ref 101–111)
CO2: 30 mmol/L (ref 22–32)
CREATININE: 0.98 mg/dL (ref 0.61–1.24)
Calcium: 9.1 mg/dL (ref 8.9–10.3)
GFR calc Af Amer: >60 mL/min (ref >60)
GLUCOSE: 109 mg/dL — AB (ref 65–99)
Potassium: 4.7 mmol/L (ref 3.5–5.1)
Sodium: 136 mmol/L (ref 135–145)
Total Bilirubin: 0.8 mg/dL (ref 0.3–1.2)
Total Protein: 6.4 g/dL — ABNORMAL LOW (ref 6.5–8.1)

## 2017-12-05 LAB — BRAIN NATRIURETIC PEPTIDE: B Natriuretic Peptide: 1182.7 pg/mL — ABNORMAL HIGH (ref 0.0–100.0)

## 2017-12-05 LAB — LIPASE, BLOOD: LIPASE: 44 U/L (ref 11–51)

## 2017-12-05 LAB — PROTIME-INR
INR: 1.22
Prothrombin Time: 15.3 seconds — ABNORMAL HIGH (ref 11.4–15.2)

## 2017-12-05 LAB — D-DIMER, QUANTITATIVE: D-Dimer, Quant: 0.86 ug/mL-FEU — ABNORMAL HIGH (ref 0.00–0.50)

## 2017-12-05 LAB — TROPONIN I: Troponin I: 0.08 ng/mL (ref <0.03)

## 2017-12-05 LAB — I-STAT CG4 LACTIC ACID, ED: Lactic Acid, Venous: 1.49 mmol/L (ref 0.5–1.9)

## 2017-12-05 LAB — MAGNESIUM: Magnesium: 2.5 mg/dL — ABNORMAL HIGH (ref 1.7–2.4)

## 2017-12-05 MED ORDER — NOREPINEPHRINE BITARTRATE 1 MG/ML IV SOLN
0.0000 ug/min | Freq: Once | INTRAVENOUS | Status: AC
  Administered 2017-12-05: 5 ug/min via INTRAVENOUS
  Filled 2017-12-05: qty 4

## 2017-12-05 MED ORDER — FLUTICASONE PROPIONATE 50 MCG/ACT NA SUSP
1.0000 | Freq: Two times a day (BID) | NASAL | Status: DC
Start: 2017-12-06 — End: 2017-12-09
  Administered 2017-12-06 – 2017-12-09 (×7): 1 via NASAL
  Filled 2017-12-05: qty 16

## 2017-12-05 MED ORDER — IOPAMIDOL (ISOVUE-370) INJECTION 76%
INTRAVENOUS | Status: AC
  Administered 2017-12-05: 100 mL
  Filled 2017-12-05: qty 100

## 2017-12-05 MED ORDER — FUROSEMIDE 10 MG/ML IJ SOLN
40.0000 mg | Freq: Once | INTRAMUSCULAR | Status: AC
  Administered 2017-12-06: 40 mg via INTRAVENOUS
  Filled 2017-12-05: qty 4

## 2017-12-05 MED ORDER — ALBUTEROL SULFATE (2.5 MG/3ML) 0.083% IN NEBU
5.0000 mg | INHALATION_SOLUTION | Freq: Once | RESPIRATORY_TRACT | Status: AC
  Administered 2017-12-05: 5 mg via RESPIRATORY_TRACT
  Filled 2017-12-05: qty 6

## 2017-12-05 MED ORDER — NOREPINEPHRINE 4 MG/250ML-% IV SOLN
0.0000 ug/min | INTRAVENOUS | Status: DC
  Administered 2017-12-05: 12.5 ug/min via INTRAVENOUS
  Filled 2017-12-05: qty 250

## 2017-12-05 NOTE — ED Notes (Signed)
While palpating right radial pulse-patient is not perfusing when he has frequent ectopy

## 2017-12-05 NOTE — H&P (Signed)
[name] JJO:[id number] DOB: [birth year] DOA: 12/05/2017     PCP: Gregor Hams, FNP   Outpatient Specialists:   [place]   NEurology Dr. Jannifer Franklin      Patient arrived to ER on 12/05/17 at [facility]  Patient coming from:  home Lives With family    Chief Complaint:  Chief Complaint  Patient presents with  . Shortness of Breath  . Weakness    HPI: [name] is a 84 y.o. male with medical history significant of CAD and paroxysmal atrial fibrillation, dementia history of bladder cancer, CKD, HLD Prostate cancer nonsustained ventricular tachycardia in 2012  Presented patient for the past 1 week have been feeling particularly short of breath and weak all over he presented to his primary care office today was noted to be hypoxic down to 88% and sent to emergency department of note at the doctor's office blood pressure was systolics in the 160F on arrival to emergency department initially satting 91% on room air Reports some associated cough but no wheezing has been overall feeling nauseous and lightheaded No chest pain last week he endorsed near syncopal episode but not often this week have not had any palpitations.  patient does have history of mild dementia Last echogram in April 2018 showed preserved EF   While in ER: Noted to be hypoxic and started on 2 L of nasal cannula seem to have improved transiently was noted to be hypotensive in the emergency department this felt to be most likely erroneous secondary to bigeminy and inability of blood pressure cuff to read appropriately.  But given transient hypotension he was started on levo fed this was discontinued as blood pressure noted to be normal on manual reading and patient clinically appeared to be without any evidence of distress Noted to be in bigeminy in the emergency department heart rates in's 40s Initially critical care was called but after further discussion Levophed was discontinued and plan was to  admit to medicine Significant initial  Findings: Abnormal Labs Reviewed  CBC WITH DIFFERENTIAL/PLATELET - Abnormal; Notable for the following components:      Result Value   WBC 11.7 (*)    MCV 100.4 (*)    Monocytes Absolute 1.4 (*)    All other components within normal limits  COMPREHENSIVE METABOLIC PANEL - Abnormal; Notable for the following components:   Chloride 97 (*)    Glucose, Bld 109 (*)    Total Protein 6.4 (*)    All other components within normal limits  PROTIME-INR - Abnormal; Notable for the following components:   Prothrombin Time 15.3 (*)    All other components within normal limits  MAGNESIUM - Abnormal; Notable for the following components:   Magnesium 2.5 (*)    All other components within normal limits  BRAIN NATRIURETIC PEPTIDE - Abnormal; Notable for the following components:   B Natriuretic Peptide 1,182.7 (*)    All other components within normal limits  D-DIMER, QUANTITATIVE (NOT AT [facility]) - Abnormal; Notable for the following components:   D-Dimer, Quant 0.86 (*)    All other components within normal limits     Na 136 K 4.7  Cr   stable,   Lab Results  Component Value Date   CREATININE 0.98 12/05/2017   CREATININE 0.97 05/26/2017   CREATININE 1.03 01/16/2017        HG/HCT     Component Value Date/Time   HGB 15.3 12/05/2017 1936   HCT 47.6 12/05/2017 1936  Troponin (Point of Care Test) Recent Labs    12/05/17 2203  TROPIPOC 0.06       BNP (last 3 results) Recent Labs    12/05/17 1936  BNP 1,182.7*    ProBNP (last 3 results) No results for input(s): PROBNP in the last 8760 hours.  Lactic Acid, Venous    Component Value Date/Time   LATICACIDVEN 1.49 12/05/2017 2218      UA no evidence of infection    CXR -cardiomegaly and right pleural effusion Ct angio: No PE moderate size right pleural effusion with right basilar atelectasis mild cardiomegaly  ECG:  Personally reviewed by me showing: HR :77 Rhythm:  Prolonged PR interval new bigeminy Ischemic changes nonspecific changes,  QTC 436    ED Triage Vitals  Enc Vitals Group     BP 12/05/17 1835 107/74     Pulse Rate 12/05/17 1830 78     Resp 12/05/17 1830 20     Temp 12/05/17 1830 98.2 F (36.8 C)     Temp src --      SpO2 12/05/17 1830 91 %     Weight 12/05/17 1836 192 lb (87.1 kg)     Height 12/05/17 1836 5\' 10"  (1.778 m)     Head Circumference --      Peak Flow --      Pain Score 12/05/17 1835 0     Pain Loc --      Pain Edu? --      Excl. in [place]? --   TMAX(24)@     on arrival  ED Triage Vitals  Enc Vitals Group     BP 12/05/17 1835 107/74     Pulse Rate 12/05/17 1830 78     Resp 12/05/17 1830 20     Temp 12/05/17 1830 98.2 F (36.8 C)     Temp src --      SpO2 12/05/17 1830 91 %     Weight 12/05/17 1836 192 lb (87.1 kg)     Height 12/05/17 1836 5\' 10"  (1.778 m)     Head Circumference --      Peak Flow --      Pain Score 12/05/17 1835 0     Pain Loc --      Pain Edu? --      Excl. in [place]? --      Latest  Blood pressure (!) 171/94, pulse 91, temperature 98.2 F (36.8 C), resp. rate (!) 24, height 5\' 10"  (1.778 m), weight 87.1 kg (192 lb), SpO2 97 %.   Following Medications were ordered in ER: Medications  norepinephrine (LEVOPHED) 4mg  in D5W 264mL premix infusion (0 mcg/min Intravenous Stopped 12/05/17 2122)  albuterol (PROVENTIL) (2.5 MG/3ML) 0.083% nebulizer solution 5 mg (5 mg Nebulization Given 12/05/17 1903)  norepinephrine (LEVOPHED) 4 mg in dextrose 5 % 250 mL (0.016 mg/mL) infusion (0 mcg/min Intravenous Stopped 12/05/17 2137)  iopamidol (ISOVUE-370) 76 % injection (100 mLs  Contrast Given 12/05/17 2039)     ER Provider Called:   Critical care medicine They Recommend admit to internal medicine    Hospitalist was called for admission for symptomatic pleural effusion and new bigeminy  Regarding pertinent Chronic problems: History of hypertension reports compliance with home medications reports chronic leg  edema but no significant change from prior  Review of Systems:    Pertinent positives include: fatigue,shortness of breath at rest dyspnea on exertion, productive cough, constipation, Bilateral lower extremity swelling    Constitutional:  No weight loss, night  sweats, Fevers, chills,  weight loss HEENT:  No headaches, Difficulty swallowing,Tooth/dental problems,Sore throat,  No sneezing, itching, ear ache, nasal congestion, post nasal drip,  Cardio-vascular:  No chest pain, Orthopnea, PND, anasarca, dizziness, palpitations. GI:  No heartburn, indigestion, abdominal pain, nausea, vomiting, diarrhea, change in bowel habits, loss of appetite, melena, blood in stool, hematemesis Resp:    No excess mucus, no  No non-productive cough, No coughing up of blood.No change in color of mucus.No wheezing. Skin:  no rash or lesions. No jaundice GU:  no dysuria, change in color of urine, no urgency or frequency. No straining to urinate.  No flank pain.  Musculoskeletal:  No joint pain or no joint swelling. No decreased range of motion. No back pain.  Psych:  No change in mood or affect. No depression or anxiety. No memory loss.  Neuro: no localizing neurological complaints, no tingling, no weakness, no double vision, no gait abnormality, no slurred speech, no confusion  As per HPI otherwise 10 point review of systems negative.   Past Medical History: Blood pressure (!) 171/94, pulse 91, temperature 98.2 F (36.8 C), resp. rate (!) 24, height 5\' 10"  (1.778 m), weight 87.1 kg (192 lb), SpO2 97 %.EDMEDS Past Medical History:  Diagnosis Date  . Arthritis   . Bladder cancer ([place])   . CAD (coronary artery disease)    cath 02/28/11: dLM 10%, pLAD 20%, pRCA calcified nodule 50% (FFR 0.86 - not significant), mRCA 40-50%, mildly elevated filling pressures, EF 50-55%  . Cataract   . Chronic kidney disease   . Gait abnormality 05/26/2017  . Hard of hearing   . Hyperlipidemia   . Hypertension    LOV  with EKG 06/11/11 Dr Caryl Comes  [facility]  . Memory difficulty 05/26/2017  . Prostate cancer ([place])    status post brachytherapy---in 2003  . Sleep apnea    STOP BANG SCORE 4  . Ventricular tachycardia, nonsustained/bigeminy    echo 6/12: EF 55%, basal inferolat AK, mild LVH     Past Surgical History:  Procedure Laterality Date  . APPENDECTOMY    . CARDIAC CATHETERIZATION  6/12  . CYSTOSCOPY  05/20/2012   Procedure: CYSTOSCOPY;  Surgeon: Alexis Frock, MD;  Location: [facility];  Service: Urology;  Laterality: N/A;  . CYSTOSCOPY  07/01/2012   Procedure: CYSTOSCOPY;  Surgeon: Molli Hazard, MD;  Location: [facility];  Service: Urology;  Laterality: N/A;  . EYE SURGERY     left cataract extraction with IOL  . SKIN CANCER EXCISION     x 4  . TRANSURETHRAL RESECTION OF BLADDER TUMOR  05/20/2012   Procedure: TRANSURETHRAL RESECTION OF BLADDER TUMOR (TURBT);  Surgeon: Alexis Frock, MD;  Location: [facility];  Service: Urology;  Laterality: N/A;  [place]   . TRANSURETHRAL RESECTION OF BLADDER TUMOR  07/01/2012   Procedure: TRANSURETHRAL RESECTION OF BLADDER TUMOR (TURBT);  Surgeon: Molli Hazard, MD;  Location: [facility];  Service: Urology;  Laterality: N/A;  Cystoscopy, TURBT, be prepared for Open Cystorraphy        Social History:  Ambulatory occasional cane or walker      reports that he quit smoking about 22 years ago. His smoking use included cigars. His smokeless tobacco use includes chew. He reports that he drinks alcohol. He reports that he does not use drugs.     Family History:   Family History  Problem Relation Age of Onset  . Arthritis Son   . Heart Problems Son  Aortic valve replacement  . Lung cancer Daughter   . Heart attack Neg Hx   . Stroke Neg Hx   . Hypertension Neg Hx     Allergies: Allergies  Allergen Reactions  . Neosporin [Neomycin-Bacitracin Zn-Polymyx] Other (See Comments)    Makes his skin red   . Penicillins Hives and Itching  . Penicillin G  Rash     Prior to Admission medications   Medication Sig Start Date End Date Taking? Authorizing Provider  aspirin EC 81 MG tablet Take 81 mg by mouth daily.    Yes [provider]  atorvastatin (LIPITOR) 20 MG tablet Take 20 mg by mouth every evening.    Yes [provider]  fluticasone (FLONASE) 50 MCG/ACT nasal spray Place 1 spray into the nose daily.  11/14/17 11/14/18 Yes [provider]  donepezil (ARICEPT) 10 MG tablet TAKE 1 TABLET BY MOUTH EVERYDAY AT BEDTIME 11/05/17   Ward Givens, NP  furosemide (LASIX) 20 MG tablet Take 1 tablet (20 mg total) by mouth daily. Patient not taking: Reported on 10/08/2017 01/07/17   Mariel Aloe, MD   Physical Exam:  1. General:  in No Acute distress  well  -appearing 2. Psychological: Alert and   Oriented 3. Head/ENT:   Moist   Mucous Membranes                          Head Non traumatic, neck supple                           Poor Dentition 4. SKIN: normal  Skin turgor,  Skin clean Dry and intact scaling lower extremities noted with evidence of chronic venous stasis changes 5. Heart: Regular rate and rhythm no  Murmur, no Rub or gallop 6. Lungs:   no wheezes some crackles diminished on the right 7. Abdomen: Soft,  non-tender,   distended   obese  bowel sounds present 8. Lower extremities: no clubbing, cyanosis, 1+ edema 9. Neurologically Grossly intact, moving all 4 extremities equally   10. MSK: Normal range of motion   LABS:     Recent Labs  Lab 12/05/17 1936  WBC 11.7*  NEUTROABS 7.7  HGB 15.3  HCT 47.6  MCV 100.4*  PLT 222   Basic Metabolic Panel: Recent Labs  Lab 12/05/17 1936  NA 136  K 4.7  CL 97*  CO2 30  GLUCOSE 109*  BUN 20  CREATININE 0.98  CALCIUM 9.1  MG 2.5*    Recent Labs  Lab 12/05/17 1936  LIPASE 44     Urine analysis:    Component Value Date/Time   COLORURINE YELLOW 12/05/2017 2239   APPEARANCEUR CLEAR 12/05/2017 2239   LABSPEC >1.046 (H) 12/05/2017 2239    PHURINE 5.0 12/05/2017 2239   GLUCOSEU NEGATIVE 12/05/2017 2239   HGBUR NEGATIVE 12/05/2017 2239   BILIRUBINUR NEGATIVE 12/05/2017 2239   KETONESUR NEGATIVE 12/05/2017 2239   PROTEINUR 100 (A) 12/05/2017 2239   NITRITE NEGATIVE 12/05/2017 2239   LEUKOCYTESUR NEGATIVE 12/05/2017 2239       Cultures: No results found for: SDES, [place], CULT, REPTSTATUS   Radiological Exams on Admission: Dg Chest 2 View  Result Date: 12/05/2017 CLINICAL DATA:  Shortness of breath EXAM: CHEST - 2 VIEW COMPARISON:  Chest radiograph 01/03/2017 FINDINGS: Moderate cardiomegaly with medium-sized right pleural effusion. Central pulmonary vascular congestion. Right basilar atelectasis. No pneumothorax. IMPRESSION: Moderate cardiomegaly and medium-sized right pleural  effusion. Central pulmonary vascular congestion without overt pulmonary edema. Electronically Signed   By: Ulyses Jarred M.D.   On: 12/05/2017 19:57   Ct Angio Chest Pe W And/or Wo Contrast  Result Date: 12/05/2017 CLINICAL DATA:  Shortness of breath 3 days.  Mild hypoxia. EXAM: CT ANGIOGRAPHY CHEST WITH CONTRAST TECHNIQUE: Multidetector CT imaging of the chest was performed using the standard protocol during bolus administration of intravenous contrast. Multiplanar CT image reconstructions and MIPs were obtained to evaluate the vascular anatomy. CONTRAST:  98 mL ISOVUE-370 IOPAMIDOL (ISOVUE-370) INJECTION 76% COMPARISON:  CT abdomen 06/24/2014 FINDINGS: Cardiovascular: Mild cardiomegaly. Calcified plaque over the left main, left anterior descending and right coronary arteries. Calcified plaque over the thoracic aorta. No evidence of pulmonary embolism. Mediastinum/Nodes: 1 cm right paratracheal lymph node. No mediastinal adenopathy. Remaining mediastinal structures are within normal. Lungs/Pleura: Lungs are adequately inflated demonstrate a moderate size right pleural effusion with mild associated atelectasis in the right base. Calcified granuloma over  the right base. Left lung is clear. Airways are normal. Upper Abdomen: No acute abnormality. Musculoskeletal: Mild degenerate change of the spine. There is a mild compression fracture in the region of the thoracolumbar junction new since the previous exam, but age indeterminate. Review of the MIP images confirms the above findings. IMPRESSION: No evidence of pulmonary embolism. Moderate size right pleural effusion with right basilar atelectasis. Mild cardiomegaly with atherosclerotic coronary artery disease. Aortic Atherosclerosis (ICD10-I70.0). Mild compression fracture in the region of the thoracolumbar junction new since the previous exam, but age indeterminate. Electronically Signed   By: Marin Olp M.D.   On: 12/05/2017 20:59    Chart has been reviewed    Assessment/Plan   84 y.o. male with medical history significant of CAD and paroxysmal atrial fibrillation, dementia history of bladder cancer, CKD, HLD Prostate cancer nonsustained ventricular tachycardia in 2012 Admitted for symptomatic pleural effusion and new bigeminy   Present on Admission:  . Nonobstructive CAD (coronary artery disease) -  - chronic, hold aspirin may need procedures continue statin    Elevated troponin in a setting of new onset bigeminy and fluid overload no chest pain likely demand related we will continue to cycle obtain echogram appreciate cardiology consult . Memory difficulty - continue Aricept . Hypertension -for now we will diurese with IV Lasix monitor carefully blood pressures   . Hyperlipidemia - stable continue statins . Atrial fibrillation (HCC) paroxysmal currently does not appear to be in atrial fibrillation patient has been followed by cardiology in the past not on anticoagulation felt to be likely secondary to risk of falls and advanced age defer to cardiology regarding long-term management . Bladder cancer (HCC) chronic remote . Pleural effusion on right IR consult for thoracentesis sent for  cytology given history of cancer . Acute respiratory failure with hypoxia (HCC) most likely multifactorial but likely secondary to pleural effusion will benefit from thoracentesis for symptomatic pleural effusion . Ventricular bigeminy -intermittent currently appears to be minimally symptomatic with complaints of fatigue.  Will obtain echogram appreciate cardiology consult continue to cycle cardiac enzymes  Other plan as per orders.  DVT prophylaxis:  SCD     Code Status:  FULL CODE  as per patient    Family Communication:   Family not at  Bedside    Disposition Plan:      To home once workup is complete and patient is stable  Would benefit from PT/OT eval prior to DC please order when stable                                                Consults called: email cardiology  Admission status:    inpatient     Level of care        SDU given significant shortness of breath and increased respirations We will notify E-link  regarding admission   I have spent a total of 66 min on this admission extra time spent to discuss case with consultants  [name] 12/05/2017, 11:55 PM    [facility]  Pager [phone]   after 2 AM please page floor coverage PA If 7AM-7PM, please contact the day team taking care of the patient  [email or web address]  Password [email or web address]

## 2017-12-05 NOTE — ED Triage Notes (Signed)
Patient was at the PCP to be seen for c/o SOB x 3 days. Room air sats 91% in triage.

## 2017-12-05 NOTE — ED Notes (Signed)
MP atrial fib with frequent PVC's-occ multifocal couplet noted. Patient denies any SOB but noted RR22-24 at rest

## 2017-12-05 NOTE — ED Notes (Signed)
BP cuff moved to right radial Systolic 352-YELYHT BP 093/1-PETKK to right thigh area/Levophed discontinued. MP atrial fib with frequent ectopy/PVC's-occ couplets

## 2017-12-05 NOTE — ED Notes (Signed)
Unable to obtain BP right thigh-moved to left thigh

## 2017-12-05 NOTE — ED Notes (Signed)
Respiratory made aware of breathing treatment, asked if ED RN can administer.

## 2017-12-05 NOTE — ED Notes (Signed)
Unable to obtain BP with dinamapp-previous manual 78 with very poor volume-Levophed being titrated

## 2017-12-05 NOTE — ED Notes (Signed)
RN made aware of blood pressure

## 2017-12-05 NOTE — ED Provider Notes (Signed)
[facility] DEPT Provider Note   CSN: [id number] Arrival date & time: 12/05/17  1093     History   Chief Complaint Chief Complaint  Patient presents with  . Shortness of Breath  . Weakness    HPI [name] is a 84 y.o. male.  The history is provided by the patient, the spouse and medical records. No language interpreter was used.  Shortness of Breath  This is a new problem. The average episode lasts 3 days. The problem occurs continuously.The current episode started more than 2 days ago. The problem has been gradually worsening. Associated symptoms include cough. Pertinent negatives include no fever, no headaches, no rhinorrhea, no neck pain, no sputum production, no hemoptysis, no wheezing, no chest pain, no syncope, no vomiting, no abdominal pain, no rash, no leg pain and no leg swelling. It is unknown what precipitated the problem. He has tried nothing for the symptoms. The treatment provided no relief. He has had no prior hospitalizations. Associated medical issues include CAD.    Past Medical History:  Diagnosis Date  . Arthritis   . Bladder cancer ([place])   . CAD (coronary artery disease)    cath 02/28/11: dLM 10%, pLAD 20%, pRCA calcified nodule 50% (FFR 0.86 - not significant), mRCA 40-50%, mildly elevated filling pressures, EF 50-55%  . Cataract   . Chronic kidney disease   . Gait abnormality 05/26/2017  . Hard of hearing   . Hyperlipidemia   . Hypertension    LOV with EKG 06/11/11 Dr Caryl Comes  [facility]  . Memory difficulty 05/26/2017  . Prostate cancer ([place])    status post brachytherapy---in 2003  . Sleep apnea    STOP BANG SCORE 4  . Ventricular tachycardia, nonsustained/bigeminy    echo 6/12: EF 55%, basal inferolat AK, mild LVH    Patient Active Problem List   Diagnosis Date Noted  . Memory difficulty 05/26/2017  . Gait abnormality 05/26/2017  . Constipation 01/03/2017  . Atrial fibrillation ([place]) 01/03/2017  . Hyponatremia  01/03/2017  . First degree AV block 06/28/2013  . Bladder cancer ([place]) 07/01/2012  . Cataracts, bilateral 03/14/2011  . Ventricular bigeminy   . Nonsustained ventricular tachycardia ([place])   . Nonobstructive CAD (coronary artery disease)   . Hypertension   . Hyperlipidemia     Past Surgical History:  Procedure Laterality Date  . APPENDECTOMY    . CARDIAC CATHETERIZATION  6/12  . CYSTOSCOPY  05/20/2012   Procedure: CYSTOSCOPY;  Surgeon: Alexis Frock, MD;  Location: [facility];  Service: Urology;  Laterality: N/A;  . CYSTOSCOPY  07/01/2012   Procedure: CYSTOSCOPY;  Surgeon: Molli Hazard, MD;  Location: [facility];  Service: Urology;  Laterality: N/A;  . EYE SURGERY     left cataract extraction with IOL  . SKIN CANCER EXCISION     x 4  . TRANSURETHRAL RESECTION OF BLADDER TUMOR  05/20/2012   Procedure: TRANSURETHRAL RESECTION OF BLADDER TUMOR (TURBT);  Surgeon: Alexis Frock, MD;  Location: [facility];  Service: Urology;  Laterality: N/A;  [place]   . TRANSURETHRAL RESECTION OF BLADDER TUMOR  07/01/2012   Procedure: TRANSURETHRAL RESECTION OF BLADDER TUMOR (TURBT);  Surgeon: Molli Hazard, MD;  Location: [facility];  Service: Urology;  Laterality: N/A;  Cystoscopy, TURBT, be prepared for Open Cystorraphy            Home Medications    Prior to Admission medications   Medication Sig Start Date End Date Taking? Authorizing Provider  acetaminophen (  TYLENOL) 500 MG tablet Take 500 mg by mouth every 6 (six) hours as needed (pain).     [provider]  aspirin EC 81 MG tablet Take 81 mg by mouth.    [provider]  atorvastatin (LIPITOR) 20 MG tablet Take 20 mg by mouth every evening.     [provider]  donepezil (ARICEPT) 10 MG tablet TAKE 1 TABLET BY MOUTH EVERYDAY AT BEDTIME 11/05/17   Ward Givens, NP  furosemide (LASIX) 20 MG tablet Take 1 tablet (20 mg total) by mouth daily. Patient not taking: Reported on 10/08/2017 01/07/17   Mariel Aloe, MD    Multiple Vitamin (MULTIVITAMIN WITH MINERALS) TABS Take 1 tablet by mouth daily.    [provider]  Omega-3 1000 MG CAPS Take 1,000 mg by mouth 2 (two) times daily.     [provider]  prednisoLONE acetate (PRED FORTE) 1 % ophthalmic suspension Place 1 drop into both eyes 3 (three) times daily.    [provider]  Probiotic Product ([place]) Take 1 capsule by mouth daily.     [provider]  zinc gluconate 50 MG tablet Take 50 mg by mouth daily.    [provider]    Family History Family History  Problem Relation Age of Onset  . Arthritis Son   . Heart Problems Son        Aortic valve replacement  . Lung cancer Daughter   . Heart attack Neg Hx   . Stroke Neg Hx   . Hypertension Neg Hx     Social History Social History   Tobacco Use  . Smoking status: Former Smoker    Types: Cigars    Last attempt to quit: 05/20/1995    Years since quitting: 22.5  . Smokeless tobacco: Current User    Types: Chew  Substance Use Topics  . Alcohol use: Yes    Comment: 6 beer week  . Drug use: No     Allergies   Neosporin [neomycin-bacitracin zn-polymyx]; Penicillins; and Penicillin g   Review of Systems Review of Systems  Constitutional: Positive for chills and fatigue. Negative for diaphoresis and fever.  HENT: Negative for congestion and rhinorrhea.   Eyes: Negative for visual disturbance.  Respiratory: Positive for cough and shortness of breath. Negative for hemoptysis, sputum production, choking, chest tightness, wheezing and stridor.   Cardiovascular: Negative for chest pain, palpitations, leg swelling and syncope.  Gastrointestinal: Positive for nausea. Negative for abdominal pain, constipation, diarrhea and vomiting.  Genitourinary: Negative for dysuria, enuresis, flank pain and frequency.  Musculoskeletal: Negative for back pain, neck pain and neck stiffness.  Skin: Negative for rash and wound.  Neurological:  Positive for light-headedness. Negative for dizziness, tremors, syncope, weakness and headaches.  Psychiatric/Behavioral: Negative for agitation and confusion.  All other systems reviewed and are negative.    Physical Exam Updated Vital Signs BP 107/74 (BP Location: Right Arm)   Pulse 78   Temp 98.2 F (36.8 C)   Resp 20   Ht 5\' 10"  (1.778 m)   Wt 87.1 kg (192 lb)   SpO2 91%   BMI 27.55 kg/m   Physical Exam  Constitutional: He is oriented to person, place, and time. He appears well-developed and well-nourished.  Non-toxic appearance. He does not appear ill. No distress.  HENT:  Head: Normocephalic.  Nose: Nose normal.  Mouth/Throat: Oropharynx is clear and moist. No oropharyngeal exudate.  Eyes: Pupils are equal, round, and reactive to light.  Conjunctivae and EOM are normal.  Neck: Normal range of motion.  Cardiovascular: Intact distal pulses. An irregular rhythm present. Bradycardia present.  No murmur heard. Patient is in bigeminy with a palpated bradycardia.  Pulmonary/Chest: Effort normal. No respiratory distress. He has no wheezes. He has no rhonchi. He has rales in the right lower field and the left lower field. He exhibits no tenderness.  Abdominal: Soft. There is no tenderness. There is no guarding.  Musculoskeletal: He exhibits no edema or tenderness.  Neurological: He is alert and oriented to person, place, and time. No sensory deficit. He exhibits normal muscle tone.  Skin: Capillary refill takes less than 2 seconds. He is not diaphoretic. No erythema. No pallor.  Psychiatric: He has a normal mood and affect.  Nursing note and vitals reviewed.    ED Treatments / Results  Labs (all labs ordered are listed, but only abnormal results are displayed) Labs Reviewed  CBC WITH DIFFERENTIAL/PLATELET - Abnormal; Notable for the following components:      Result Value   WBC 11.7 (*)    MCV 100.4 (*)    Monocytes Absolute 1.4 (*)    All other components within normal  limits  COMPREHENSIVE METABOLIC PANEL - Abnormal; Notable for the following components:   Chloride 97 (*)    Glucose, Bld 109 (*)    Total Protein 6.4 (*)    All other components within normal limits  PROTIME-INR - Abnormal; Notable for the following components:   Prothrombin Time 15.3 (*)    All other components within normal limits  MAGNESIUM - Abnormal; Notable for the following components:   Magnesium 2.5 (*)    All other components within normal limits  BRAIN NATRIURETIC PEPTIDE - Abnormal; Notable for the following components:   B Natriuretic Peptide 1,182.7 (*)    All other components within normal limits  D-DIMER, QUANTITATIVE (NOT AT [facility]) - Abnormal; Notable for the following components:   D-Dimer, Quant 0.86 (*)    All other components within normal limits  URINALYSIS, ROUTINE W REFLEX MICROSCOPIC - Abnormal; Notable for the following components:   Specific Gravity, Urine >1.046 (*)    Protein, ur 100 (*)    Bacteria, UA RARE (*)    Squamous Epithelial / LPF 0-5 (*)    All other components within normal limits  TROPONIN I - Abnormal; Notable for the following components:   Troponin I 0.08 (*)    All other components within normal limits  URINE CULTURE  CULTURE, BLOOD (ROUTINE X 2)  CULTURE, BLOOD (ROUTINE X 2)  LIPASE, BLOOD  TROPONIN I  TROPONIN I  I-STAT TROPONIN, ED  I-STAT CG4 LACTIC ACID, ED  I-STAT TROPONIN, ED  I-STAT CG4 LACTIC ACID, ED    EKG EKG Interpretation  Date/Time:  Friday December 05 2017 18:34:40 EDT Ventricular Rate:  77 PR Interval:    QRS Duration: 99 QT Interval:  385 QTC Calculation: 436 R Axis:   -57 Text Interpretation:  Sinus rhythm Ventricular bigeminy Prolonged PR interval Left anterior fascicular block Anterior infarct, old Nonspecific T abnormalities, lateral leads When compared to prior, new bigeminy.  No STEMI Confirmed by Antony Blackbird [phone]) on 12/05/2017 6:43:33 PM   Radiology Dg Chest 2 View  Result Date:  12/05/2017 CLINICAL DATA:  Shortness of breath EXAM: CHEST - 2 VIEW COMPARISON:  Chest radiograph 01/03/2017 FINDINGS: Moderate cardiomegaly with medium-sized right pleural effusion. Central pulmonary vascular congestion. Right basilar atelectasis. No pneumothorax. IMPRESSION: Moderate cardiomegaly and medium-sized right pleural effusion. Central  pulmonary vascular congestion without overt pulmonary edema. Electronically Signed   By: Ulyses Jarred M.D.   On: 12/05/2017 19:57   Ct Angio Chest Pe W And/or Wo Contrast  Result Date: 12/05/2017 CLINICAL DATA:  Shortness of breath 3 days.  Mild hypoxia. EXAM: CT ANGIOGRAPHY CHEST WITH CONTRAST TECHNIQUE: Multidetector CT imaging of the chest was performed using the standard protocol during bolus administration of intravenous contrast. Multiplanar CT image reconstructions and MIPs were obtained to evaluate the vascular anatomy. CONTRAST:  98 mL ISOVUE-370 IOPAMIDOL (ISOVUE-370) INJECTION 76% COMPARISON:  CT abdomen 06/24/2014 FINDINGS: Cardiovascular: Mild cardiomegaly. Calcified plaque over the left main, left anterior descending and right coronary arteries. Calcified plaque over the thoracic aorta. No evidence of pulmonary embolism. Mediastinum/Nodes: 1 cm right paratracheal lymph node. No mediastinal adenopathy. Remaining mediastinal structures are within normal. Lungs/Pleura: Lungs are adequately inflated demonstrate a moderate size right pleural effusion with mild associated atelectasis in the right base. Calcified granuloma over the right base. Left lung is clear. Airways are normal. Upper Abdomen: No acute abnormality. Musculoskeletal: Mild degenerate change of the spine. There is a mild compression fracture in the region of the thoracolumbar junction new since the previous exam, but age indeterminate. Review of the MIP images confirms the above findings. IMPRESSION: No evidence of pulmonary embolism. Moderate size right pleural effusion with right basilar  atelectasis. Mild cardiomegaly with atherosclerotic coronary artery disease. Aortic Atherosclerosis (ICD10-I70.0). Mild compression fracture in the region of the thoracolumbar junction new since the previous exam, but age indeterminate. Electronically Signed   By: Marin Olp M.D.   On: 12/05/2017 20:59    Procedures Procedures (including critical care time)  CRITICAL CARE Performed by: Gwenyth Allegra Kathe Wirick Total critical care time: 35 minutes Critical care time was exclusive of separately billable procedures and treating other patients. Critical care was necessary to treat or prevent imminent or life-threatening deterioration. Critical care was time spent personally by me on the following activities: development of treatment plan with patient and/or surrogate as well as nursing, discussions with consultants, evaluation of patient's response to treatment, examination of patient, obtaining history from patient or surrogate, ordering and performing treatments and interventions, ordering and review of laboratory studies, ordering and review of radiographic studies, pulse oximetry and re-evaluation of patient's condition.  Medications Ordered in ED Medications  norepinephrine (LEVOPHED) 4mg  in D5W 225mL premix infusion (0 mcg/min Intravenous Stopped 12/05/17 2122)  furosemide (LASIX) injection 40 mg (has no administration in time range)  albuterol (PROVENTIL) (2.5 MG/3ML) 0.083% nebulizer solution 5 mg (5 mg Nebulization Given 12/05/17 1903)  norepinephrine (LEVOPHED) 4 mg in dextrose 5 % 250 mL (0.016 mg/mL) infusion (0 mcg/min Intravenous Stopped 12/05/17 2137)  iopamidol (ISOVUE-370) 76 % injection (100 mLs  Contrast Given 12/05/17 2039)     Initial Impression / Assessment and Plan / ED Course  I have reviewed the triage vital signs and the nursing notes.  Pertinent labs & imaging results that were available during my care of the patient were reviewed by me and considered in my medical  decision making (see chart for details).     [name] is a 84 y.o. male with a past medical history significant for CAD, CKD, hypertension, hyperlipidemia, prostate cancer, prior atrial fibrillation, and prior other arrhythmias who presents from his PCP for exertional shortness of breath, chills, dry cough, and some nausea.  Patient reports that for the last 3 days he has been having worsening exertional shortness of breath.  He reports that he cannot  take more than 2 steps without being winded.  This is a new problem for him.  He denies any leg pain or leg swelling but does have some chronic edema.  He denies any chest pain or palpitations but does report that his abdomen is slightly more swollen than normal.  He denies fevers but does report some chills.  He denies hemoptysis or production from his cough.  He denies any constipation, diarrhea, or dysuria.  He denies any emesis with the nausea.  He reports that last week he did have a near syncopal episode where he got very lightheaded after lunch.  He denied any palpitations or chest pain during that episode.  Patient reports that at the doctor's office his oxygen saturation was in the mid 80s.  Patient was started on oxygen.  Patient does not normally stay on oxygen.  On arrival, patient did have hypoxia and was placed on 2 L nasal cannula.  Patient reports his shortness of breath is slightly improved.  Patient is feeling very fatigued and tired.  He reports that he has no energy.  He denies any chest pain or palpitations at this time.  On my exam, lungs have faint crackles in the bases but there was no significant wheezing appreciated.  Chest was nontender.  Abdomen nontender.  Legs had mild edema in the lower extremity's.  Exam otherwise unremarkable.  Based on symptoms, patient workup to look for etiology of shortness of breath.  Patient will have workup to look for pneumonia given the cough, significant electrolyte abnormalities given the  bigeminy that was seen on EKG, or even fluid overload based on the swelling in his legs and reported swelling of his abdomen with crackles on the lungs.  Given the new option requirement, I suspect patient will need to be admitted however we will try to determine the etiology of his worsening symptoms.  Of note, patient was in bigeminy with a palpated heart rate in the 40s, symptom medic bradycardia from the arrhythmia also considered.  While workup is ongoing, nursing called to report that patient's blood pressure has deteriorated and was nearly undetectable.  Patient was started on fluids as BNP had not returned.  Patient continued to have some mild crackles in the bases and some decreased breath sounds on the right lower lobe.  D-dimer was found to be elevated, CT PE study was ordered.   As blood pressures were in the 40 systolic, decision was made to start the patient on levophed as it appears she is also fluid overloaded.  Anticipate patient being admitted to critical care for CHF exacerbation with fluid overload and low blood pressures and some trigeminy bradycardia.  Awaiting results of PE study.  During his low blood pressures, patient was alert and oriented.  On my evaluation with a low blood pressures I did not see any evidence of facial droop.  Patient has an irregular right eye which she reports is unchanged from baseline.  9:31 PM Critical care was called after he was started on levo fed.  Blood pressure improved and he was normotensive.  Levophed was discontinued and he remained normotensive with a blood pressure of approximately 120 measured manually.  First unknown reason, blood pressure cuffs are not reading accurately on the patient.  Critical care recommended patient be admitted to hospitalist service for further management of the likely fluid overload and CHF as well as to have thoracentesis for both therapeutic and diagnostic purposes on the right pleural effusion.  Hospital  team  called for admission.    Final Clinical Impressions(s) / ED Diagnoses   Final diagnoses:  Exertional shortness of breath  Hypoxia  Fatigue, unspecified type  Hypotension, unspecified hypotension type  Hypervolemia, unspecified hypervolemia type  Pleural effusion    Clinical Impression: 1. Exertional shortness of breath   2. Hypoxia   3. Fatigue, unspecified type   4. Hypotension, unspecified hypotension type   5. Hypervolemia, unspecified hypervolemia type   6. Pleural effusion     Disposition: Admit  This note was prepared with assistance of Dragon voice recognition software. Occasional wrong-word or sound-a-like substitutions may have occurred due to the inherent limitations of voice recognition software.      Tiffanye Hartmann, Gwenyth Allegra, MD 12/06/17 [phone]

## 2017-12-05 NOTE — ED Notes (Signed)
Ser[place]in [place], [phone] home, can be reached if needed.

## 2017-12-06 ENCOUNTER — Other Ambulatory Visit: Payer: Self-pay

## 2017-12-06 ENCOUNTER — Inpatient Hospital Stay (HOSPITAL_COMMUNITY): Payer: Medicare HMO

## 2017-12-06 DIAGNOSIS — R001 Bradycardia, unspecified: Secondary | ICD-10-CM

## 2017-12-06 DIAGNOSIS — I340 Nonrheumatic mitral (valve) insufficiency: Secondary | ICD-10-CM

## 2017-12-06 DIAGNOSIS — R413 Other amnesia: Secondary | ICD-10-CM

## 2017-12-06 DIAGNOSIS — I499 Cardiac arrhythmia, unspecified: Secondary | ICD-10-CM

## 2017-12-06 DIAGNOSIS — J90 Pleural effusion, not elsewhere classified: Secondary | ICD-10-CM

## 2017-12-06 DIAGNOSIS — I480 Paroxysmal atrial fibrillation: Secondary | ICD-10-CM

## 2017-12-06 DIAGNOSIS — J9601 Acute respiratory failure with hypoxia: Secondary | ICD-10-CM

## 2017-12-06 LAB — PROTEIN, PLEURAL OR PERITONEAL FLUID

## 2017-12-06 LAB — ECHOCARDIOGRAM COMPLETE
HEIGHTINCHES: 70 in
WEIGHTICAEL: 3072 [oz_av]

## 2017-12-06 LAB — GRAM STAIN

## 2017-12-06 LAB — BODY FLUID CELL COUNT WITH DIFFERENTIAL
EOS FL: 0 %
Lymphs, Fluid: 26 %
MONOCYTE-MACROPHAGE-SEROUS FLUID: 68 % (ref 50–90)
Neutrophil Count, Fluid: 6 % (ref 0–25)
WBC FLUID: 698 uL (ref 0–1000)

## 2017-12-06 LAB — COMPREHENSIVE METABOLIC PANEL
ALT: 36 U/L (ref 17–63)
AST: 38 U/L (ref 15–41)
Albumin: 3.8 g/dL (ref 3.5–5.0)
Alkaline Phosphatase: 100 U/L (ref 38–126)
Anion gap: 10 (ref 5–15)
BILIRUBIN TOTAL: 0.9 mg/dL (ref 0.3–1.2)
BUN: 18 mg/dL (ref 6–20)
CO2: 30 mmol/L (ref 22–32)
Calcium: 9.3 mg/dL (ref 8.9–10.3)
Chloride: 96 mmol/L — ABNORMAL LOW (ref 101–111)
Creatinine, Ser: 0.92 mg/dL (ref 0.61–1.24)
GFR calc non Af Amer: >60 mL/min (ref >60)
Glucose, Bld: 117 mg/dL — ABNORMAL HIGH (ref 65–99)
POTASSIUM: 4.6 mmol/L (ref 3.5–5.1)
Sodium: 136 mmol/L (ref 135–145)
TOTAL PROTEIN: 6.7 g/dL (ref 6.5–8.1)

## 2017-12-06 LAB — CBC
HEMATOCRIT: 45.2 % (ref 39.0–52.0)
HEMOGLOBIN: 15 g/dL (ref 13.0–17.0)
MCH: 33.3 pg (ref 26.0–34.0)
MCHC: 33.2 g/dL (ref 30.0–36.0)
MCV: 100.2 fL — ABNORMAL HIGH (ref 78.0–100.0)
Platelets: 170 10*3/uL (ref 150–400)
RBC: 4.51 MIL/uL (ref 4.22–5.81)
RDW: 15.1 % (ref 11.5–15.5)
WBC: 10.2 10*3/uL (ref 4.0–10.5)

## 2017-12-06 LAB — TSH: TSH: 2.09 u[IU]/mL (ref 0.350–4.500)

## 2017-12-06 LAB — TROPONIN I
TROPONIN I: 0.09 ng/mL — AB (ref <0.03)
Troponin I: 0.1 ng/mL (ref <0.03)

## 2017-12-06 LAB — GLUCOSE, PLEURAL OR PERITONEAL FLUID: GLUCOSE FL: 131 mg/dL

## 2017-12-06 LAB — MAGNESIUM: MAGNESIUM: 2.3 mg/dL (ref 1.7–2.4)

## 2017-12-06 LAB — PHOSPHORUS: PHOSPHORUS: 4.3 mg/dL (ref 2.5–4.6)

## 2017-12-06 LAB — MRSA PCR SCREENING: MRSA BY PCR: NEGATIVE

## 2017-12-06 LAB — LACTATE DEHYDROGENASE, PLEURAL OR PERITONEAL FLUID: LD, Fluid: 62 U/L — ABNORMAL HIGH (ref 3–23)

## 2017-12-06 MED ORDER — ONDANSETRON HCL 4 MG PO TABS: 4.0000 mg | ORAL_TABLET | Freq: Four times a day (QID) | ORAL | Status: DC | PRN

## 2017-12-06 MED ORDER — HYDROCODONE-ACETAMINOPHEN 5-325 MG PO TABS: 1.0000 | ORAL_TABLET | ORAL | Status: DC | PRN

## 2017-12-06 MED ORDER — FUROSEMIDE 10 MG/ML IJ SOLN
40.0000 mg | Freq: Every day | INTRAMUSCULAR | Status: DC
  Administered 2017-12-06 – 2017-12-07 (×2): 40 mg via INTRAVENOUS
  Filled 2017-12-06 (×2): qty 4

## 2017-12-06 MED ORDER — BISACODYL 10 MG RE SUPP: 10.0000 mg | Freq: Every day | RECTAL | Status: DC | PRN

## 2017-12-06 MED ORDER — SODIUM CHLORIDE 0.9 % IV SOLN
250.0000 mL | INTRAVENOUS | Status: DC | PRN
  Administered 2017-12-06: 250 mL via INTRAVENOUS

## 2017-12-06 MED ORDER — ACETAMINOPHEN 325 MG PO TABS: 650.0000 mg | ORAL_TABLET | Freq: Four times a day (QID) | ORAL | Status: DC | PRN

## 2017-12-06 MED ORDER — SODIUM CHLORIDE 0.9% FLUSH: 3.0000 mL | INTRAVENOUS | Status: DC | PRN

## 2017-12-06 MED ORDER — ASPIRIN EC 81 MG PO TBEC
81.0000 mg | DELAYED_RELEASE_TABLET | Freq: Every day | ORAL | Status: DC
Start: 2017-12-06 — End: 2017-12-09
  Administered 2017-12-06 – 2017-12-09 (×4): 81 mg via ORAL
  Filled 2017-12-06 (×4): qty 1

## 2017-12-06 MED ORDER — SODIUM CHLORIDE 0.9% FLUSH
3.0000 mL | Freq: Two times a day (BID) | INTRAVENOUS | Status: DC
  Administered 2017-12-06 – 2017-12-09 (×7): 3 mL via INTRAVENOUS

## 2017-12-06 MED ORDER — DONEPEZIL HCL 5 MG PO TABS: 10.0000 mg | ORAL_TABLET | Freq: Every day | ORAL | Status: DC

## 2017-12-06 MED ORDER — POLYETHYLENE GLYCOL 3350 17 G PO PACK: 17.0000 g | PACK | Freq: Every day | ORAL | Status: DC | PRN

## 2017-12-06 MED ORDER — LIDOCAINE HCL 1 % IJ SOLN
INTRAMUSCULAR | Status: AC
  Administered 2017-12-06: 10:00:00
  Filled 2017-12-06: qty 20

## 2017-12-06 MED ORDER — ACETAMINOPHEN 650 MG RE SUPP: 650.0000 mg | Freq: Four times a day (QID) | RECTAL | Status: DC | PRN

## 2017-12-06 MED ORDER — ATORVASTATIN CALCIUM 20 MG PO TABS
20.0000 mg | ORAL_TABLET | Freq: Every evening | ORAL | Status: DC
  Administered 2017-12-06 – 2017-12-08 (×3): 20 mg via ORAL
  Filled 2017-12-06: qty 1
  Filled 2017-12-06: qty 2
  Filled 2017-12-06 (×2): qty 1

## 2017-12-06 MED ORDER — ALBUTEROL SULFATE (2.5 MG/3ML) 0.083% IN NEBU: 2.5000 mg | INHALATION_SOLUTION | RESPIRATORY_TRACT | Status: DC | PRN

## 2017-12-06 MED ORDER — ONDANSETRON HCL 4 MG/2ML IJ SOLN: 4.0000 mg | Freq: Four times a day (QID) | INTRAMUSCULAR | Status: DC | PRN

## 2017-12-06 MED ORDER — PREDNISOLONE ACETATE 1 % OP SUSP
2.0000 [drp] | Freq: Two times a day (BID) | OPHTHALMIC | Status: DC | PRN
  Administered 2017-12-06 – 2017-12-08 (×2): 2 [drp] via OPHTHALMIC
  Filled 2017-12-06: qty 5

## 2017-12-06 NOTE — ED Notes (Signed)
Contacted Admitting MD about changing order from IR to Ultrasound guided Thoracentesis.

## 2017-12-06 NOTE — Procedures (Signed)
PROCEDURE SUMMARY:  Successful US guided right thoracentesis. Yielded 600 mL of clear orange fluid. Pt tolerated procedure well. No immediate complications.  Specimen was sent for labs.  Post procedure chest X-ray reveals no pneumothorax  Karleen Seebeck S Tallin Hart PA-C 12/06/2017 11:27 AM

## 2017-12-06 NOTE — Consult Note (Signed)
CARDIOLOGY CONSULT NOTE       Patient ID: [name] MRN: [id number] DOB/AGE: Jan 01, 1927 84 y.o.  Admit date: 12/05/2017 Referring Physician: Erlinda Hong Primary Physician: Gregor Hams, FNP Primary Cardiologist: Klein/Turner Reason for Consultation: Bigeminy   Active Problems:   Ventricular bigeminy   Nonobstructive CAD (coronary artery disease)   Hypertension   Hyperlipidemia   Bladder cancer [facility])   Atrial fibrillation ([place])   Memory difficulty   Pleural effusion on right   Acute respiratory failure with hypoxia ([place])   HPI:  84 y.o. admitted with fatigue and dyspnea. Noted to have large right pleural effusion Thought to be bradycardic but had pulse deficit on peripheral exam due to ectopy. This is a chronic problem for which he as seen Dr Caryl Comes for years. Was on propafenone but this was stopped recently. He is still on Aricept for dementia. No cardiac complaints on admission No chest pain, palpitations or syncope.  No obstructive CAD by Cath in 2012.  History of HTN, HLD as well as dementia. He was thought to have some PAF in April of this year.Not a candidate for anticoagulation due to frequent falls and dementia.   ROS All other systems reviewed and negative except as noted above  Past Medical History:  Diagnosis Date  . Arthritis   . Bladder cancer ([place])   . CAD (coronary artery disease)    cath 02/28/11: dLM 10%, pLAD 20%, pRCA calcified nodule 50% (FFR 0.86 - not significant), mRCA 40-50%, mildly elevated filling pressures, EF 50-55%  . Cataract   . Chronic kidney disease   . Gait abnormality 05/26/2017  . Hard of hearing   . Hyperlipidemia   . Hypertension    LOV with EKG 06/11/11 Dr Caryl Comes  [facility]  . Memory difficulty 05/26/2017  . Prostate cancer ([place])    status post brachytherapy---in 2003  . Sleep apnea    STOP BANG SCORE 4  . Ventricular tachycardia, nonsustained/bigeminy    echo 6/12: EF 55%, basal inferolat AK, mild LVH    Family History  Problem  Relation Age of Onset  . Arthritis Son   . Heart Problems Son        Aortic valve replacement  . Lung cancer Daughter   . Heart attack Neg Hx   . Stroke Neg Hx   . Hypertension Neg Hx     Social History   Socioeconomic History  . Marital status: Married    Spouse name: [name] "Romie Minus"  . Number of children: 2  . Years of education: some college  . Highest education level: Not on file  Occupational History  . Occupation: Retired  Scientific laboratory technician  . Financial resource strain: Not on file  . Food insecurity:    Worry: Not on file    Inability: Not on file  . Transportation needs:    Medical: Not on file    Non-medical: Not on file  Tobacco Use  . Smoking status: Former Smoker    Types: Cigars    Last attempt to quit: 05/20/1995    Years since quitting: 22.5  . Smokeless tobacco: Current User    Types: Chew  Substance and Sexual Activity  . Alcohol use: Yes    Comment: 6 beer week  . Drug use: No  . Sexual activity: Not on file  Lifestyle  . Physical activity:    Days per week: Not on file    Minutes per session: Not on file  . Stress: Not on file  Relationships  . Social connections:    Talks on phone: Not on file    Gets together: Not on file    Attends religious service: Not on file    Active member of club or organization: Not on file    Attends meetings of clubs or organizations: Not on file    Relationship status: Not on file  . Intimate partner violence:    Fear of current or ex partner: Not on file    Emotionally abused: Not on file    Physically abused: Not on file    Forced sexual activity: Not on file  Other Topics Concern  . Not on file  Social History Narrative   He lives at home with his wife. He is remarried.    He lost his daughter at age of 30 to lung cancer. He does not smoke.    Uses alcohol occasionally.    Denies use of recreational drugs   Caffeine use: Drinks cofe (caffeine free)   1 cup coffee per morning (decaf)   Right handed      Past Surgical History:  Procedure Laterality Date  . APPENDECTOMY    . CARDIAC CATHETERIZATION  6/12  . CYSTOSCOPY  05/20/2012   Procedure: CYSTOSCOPY;  Surgeon: Alexis Frock, MD;  Location: [facility];  Service: Urology;  Laterality: N/A;  . CYSTOSCOPY  07/01/2012   Procedure: CYSTOSCOPY;  Surgeon: Molli Hazard, MD;  Location: [facility];  Service: Urology;  Laterality: N/A;  . EYE SURGERY     left cataract extraction with IOL  . SKIN CANCER EXCISION     x 4  . TRANSURETHRAL RESECTION OF BLADDER TUMOR  05/20/2012   Procedure: TRANSURETHRAL RESECTION OF BLADDER TUMOR (TURBT);  Surgeon: Alexis Frock, MD;  Location: [facility];  Service: Urology;  Laterality: N/A;  [place]   . TRANSURETHRAL RESECTION OF BLADDER TUMOR  07/01/2012   Procedure: TRANSURETHRAL RESECTION OF BLADDER TUMOR (TURBT);  Surgeon: Molli Hazard, MD;  Location: [facility];  Service: Urology;  Laterality: N/A;  Cystoscopy, TURBT, be prepared for Open Cystorraphy         . atorvastatin  20 mg Oral QPM  . donepezil  10 mg Oral QHS  . fluticasone  1 spray Each Nare BID  . furosemide  40 mg Intravenous Daily  . sodium chloride flush  3 mL Intravenous Q12H   . sodium chloride 250 mL (12/06/17 0618)  . norepinephrine (LEVOPHED) Adult infusion Stopped (12/05/17 2122)    Physical Exam: Blood pressure 105/67, pulse 78, temperature 97.6 F (36.4 C), temperature source Oral, resp. rate (!) 23, height 5\' 10"  (1.778 m), weight 192 lb (87.1 kg), SpO2 (S) 94 %.   Affect appropriate Elderly demented white male  HEENT: normal Neck supple with no adenopathy JVP normal no bruits no thyromegaly Lungs Decreased BS right and good diaphragmatic motion Heart:  S1/S2 no murmur, no rub, gallop or click PMI normal Abdomen: benighn, BS positve, no tenderness, no AAA no bruit.  No HSM or HJR post appy  Distal pulses intact with no bruits No edema Neuro non-focal Skin warm and dry No muscular weakness   Labs:   Lab Results   Component Value Date   WBC 10.2 12/06/2017   HGB 15.0 12/06/2017   HCT 45.2 12/06/2017   MCV 100.2 (H) 12/06/2017   PLT 170 12/06/2017    Recent Labs  Lab 12/06/17 0608  NA 136  K 4.6  CL 96*  CO2 30  BUN 18  CREATININE 0.92  CALCIUM 9.3  PROT 6.7  BILITOT 0.9  ALKPHOS 100  ALT 36  AST 38  GLUCOSE 117*   Lab Results  Component Value Date   CKTOTAL 140 02/26/2011   CKMB 5.9 (H) 02/26/2011   TROPONINI 0.10 (HH) 12/06/2017    Lab Results  Component Value Date   CHOL 95 01/04/2017   CHOL 130 02/26/2011   Lab Results  Component Value Date   HDL 45 01/04/2017   HDL 50 02/26/2011   Lab Results  Component Value Date   LDLCALC 41 01/04/2017   LDLCALC 66 02/26/2011   Lab Results  Component Value Date   TRIG 45 01/04/2017   TRIG 71 02/26/2011   Lab Results  Component Value Date   CHOLHDL 2.1 01/04/2017   CHOLHDL 2.6 02/26/2011   No results found for: LDLDIRECT    Radiology: Dg Chest 2 View  Result Date: 12/05/2017 CLINICAL DATA:  Shortness of breath EXAM: CHEST - 2 VIEW COMPARISON:  Chest radiograph 01/03/2017 FINDINGS: Moderate cardiomegaly with medium-sized right pleural effusion. Central pulmonary vascular congestion. Right basilar atelectasis. No pneumothorax. IMPRESSION: Moderate cardiomegaly and medium-sized right pleural effusion. Central pulmonary vascular congestion without overt pulmonary edema. Electronically Signed   By: Ulyses Jarred M.D.   On: 12/05/2017 19:57   Ct Angio Chest Pe W And/or Wo Contrast  Result Date: 12/05/2017 CLINICAL DATA:  Shortness of breath 3 days.  Mild hypoxia. EXAM: CT ANGIOGRAPHY CHEST WITH CONTRAST TECHNIQUE: Multidetector CT imaging of the chest was performed using the standard protocol during bolus administration of intravenous contrast. Multiplanar CT image reconstructions and MIPs were obtained to evaluate the vascular anatomy. CONTRAST:  98 mL ISOVUE-370 IOPAMIDOL (ISOVUE-370) INJECTION 76% COMPARISON:  CT abdomen  06/24/2014 FINDINGS: Cardiovascular: Mild cardiomegaly. Calcified plaque over the left main, left anterior descending and right coronary arteries. Calcified plaque over the thoracic aorta. No evidence of pulmonary embolism. Mediastinum/Nodes: 1 cm right paratracheal lymph node. No mediastinal adenopathy. Remaining mediastinal structures are within normal. Lungs/Pleura: Lungs are adequately inflated demonstrate a moderate size right pleural effusion with mild associated atelectasis in the right base. Calcified granuloma over the right base. Left lung is clear. Airways are normal. Upper Abdomen: No acute abnormality. Musculoskeletal: Mild degenerate change of the spine. There is a mild compression fracture in the region of the thoracolumbar junction new since the previous exam, but age indeterminate. Review of the MIP images confirms the above findings. IMPRESSION: No evidence of pulmonary embolism. Moderate size right pleural effusion with right basilar atelectasis. Mild cardiomegaly with atherosclerotic coronary artery disease. Aortic Atherosclerosis (ICD10-I70.0). Mild compression fracture in the region of the thoracolumbar junction new since the previous exam, but age indeterminate. Electronically Signed   By: Marin Olp M.D.   On: 12/05/2017 20:59    EKG: SR bigeminy no acute chagnes    ASSESSMENT AND PLAN:  1. Ectopy:  Chronic asymptomatic no history of CAD and normal EF in past would stop aricept give peripheral pulse deficit and relative bradycardia 2. PAF : history of CHA2VASC 4 no anticoagulation due to dementia , falls and poor vision. In NSR currently 3. Pleural effusion per primary service low dose diuretic reasonale and IR thoracentesis Check echo for RV/LV EF was normal 60-65% April 2018 4. HTN:  Was low on arrival monitor 5. HLD:  On statin not clear of need at his age with no documented vascular dx  Signed: Jenkins Rouge 12/06/2017, 8:06 AM

## 2017-12-06 NOTE — Progress Notes (Signed)
  Echocardiogram 2D Echocardiogram has been performed.  [name] 12/06/2017, 12:04 PM

## 2017-12-06 NOTE — Progress Notes (Signed)
PROGRESS NOTE  [name] XNA:[id number] DOB: [birth year] DOA: 12/05/2017 PCP: Tobie Lords D, FNP  Brief summary:   Patient is sent to the ER from [place] office due to sob and hypoxia  While in ER: Noted to be hypoxic and started on 2 L of nasal cannula seem to have improved transiently was noted to be hypotensive in the emergency department this felt to be most likely erroneous secondary to bigeminy and inability of blood pressure cuff to read appropriately.  But given transient hypotension he was started on levo fed this was discontinued as blood pressure noted to be normal on manual reading and patient clinically appeared to be without any evidence of distress Noted to be in bigeminy in the emergency department heart rates in's 40s Initially critical care was called but after further discussion Levophed was discontinued and plan was to admit to medicine      HPI/Recap of past 24 hours:  Pleasantly demented elderly reports feeling better, " I have no pain what so ever"   Assessment/Plan: Active Problems:   Ventricular bigeminy   Nonobstructive CAD (coronary artery disease)   Hypertension   Hyperlipidemia   Bladder cancer ([place])   Atrial fibrillation ([place])   Memory difficulty   Pleural effusion on right   Acute respiratory failure with hypoxia ([place])  Acute hypoxic respiratory failure likely from acute on chronic systolic chf -he is sent from pmd office due to sob, hypoxia, 88% on room air -CTA negative for PE,  Mild cardiomegaly with atherosclerotic coronary artery disease.  -Troponin mildly elevated. bnp 1182. -he denies chest pain, he does has bilateral lower extremity pitting edema, asymmetrical pleural effusion on right -s/p therapeutic and diagnostic US guided thoracentesis with 600cc clear orange fluid, fluids study, culture, cytology pending -continue on iv lasix, monitor urine output, cr -cardiology consulted   bp low normal , patient has bradycardia, will  follow cardiology recommendation regarding coreg, entresto.   Bigeminy, bradycardia , h/o NSVT, h/o PAF -stop aricept -keep K.4, mag >2,  tsh2 -he was not anticoagulation due to history of falls and dementia -keep on tele -cardiology consulted  CAD: last cath in 2012 Currently denies chest pain, mild troponin elevation likely from chf exacerbation Continue asa, statin  Macrocytosis: check b12/folate  Mild compression fracture in the region of the thoracolumbar junction new since the previous exam, but age indeterminate Incidental finding on CTA chest Patient denies back pain  Dementia : pleasantly demented, not oriented to month D/c aricept due to bradycardia  H/o prostate cancer s/p brachytherapy H/o bladder cancer s/p resection  FTT: will get PT eval once chf improving  Code Status: full  Family Communication: patient   Disposition Plan: remain in stepdown   Consultants:  cardiology  Procedures:  US guided thoracentesis on right on 3/23  Antibiotics:  none   Objective: BP 92/79   Pulse 73   Temp 97.6 F (36.4 C) (Oral)   Resp (!) 27   Ht 5\' 10"  (1.778 m)   Wt 87.1 kg (192 lb)   SpO2 94%   BMI 27.55 kg/m   Intake/Output Summary (Last 24 hours) at 12/06/2017 0833 Last data filed at 12/06/2017 0556 Gross per 24 hour  Intake 1012.05 ml  Output 1150 ml  Net -137.95 ml   Filed Weights   12/05/17 1836 12/06/17 0552  Weight: 87.1 kg (192 lb) 87.1 kg (192 lb)    Exam: Patient is examined daily including today on 12/06/2017, exams remain the same as of  yesterday except that has changed    General:  NAD, pleasantly demented, not oriented to month  Cardiovascular: RRR  Respiratory: bradycardia with ectopy   Abdomen: Soft/ND/NT, positive BS  Musculoskeletal: bilateral lower extremity pitting  Edema  Neuro: alert, pleasantly demented, not oriented to month  Data Reviewed: Basic Metabolic Panel: Recent Labs  Lab 12/05/17 1936 12/06/17 0608    NA 136 136  K 4.7 4.6  CL 97* 96*  CO2 30 30  GLUCOSE 109* 117*  BUN 20 18  CREATININE 0.98 0.92  CALCIUM 9.1 9.3  MG 2.5* 2.3  PHOS  --  4.3   Liver Function Tests: Recent Labs  Lab 12/05/17 1936 12/06/17 0608  AST 32 38  ALT 29 36  ALKPHOS 100 100  BILITOT 0.8 0.9  PROT 6.4* 6.7  ALBUMIN 3.8 3.8   Recent Labs  Lab 12/05/17 1936  LIPASE 44   No results for input(s): AMMONIA in the last 168 hours. CBC: Recent Labs  Lab 12/05/17 1936 12/06/17 0608  WBC 11.7* 10.2  NEUTROABS 7.7  --   HGB 15.3 15.0  HCT 47.6 45.2  MCV 100.4* 100.2*  PLT 183 170   Cardiac Enzymes:   Recent Labs  Lab 12/05/17 2144 12/06/17 0436  TROPONINI 0.08* 0.10*   BNP (last 3 results) Recent Labs    12/05/17 1936  BNP 1,182.7*    ProBNP (last 3 results) No results for input(s): PROBNP in the last 8760 hours.  CBG: No results for input(s): GLUCAP in the last 168 hours.  No results found for this or any previous visit (from the past 240 hour(s)).   Studies: Dg Chest 2 View  Result Date: 12/05/2017 CLINICAL DATA:  Shortness of breath EXAM: CHEST - 2 VIEW COMPARISON:  Chest radiograph 01/03/2017 FINDINGS: Moderate cardiomegaly with medium-sized right pleural effusion. Central pulmonary vascular congestion. Right basilar atelectasis. No pneumothorax. IMPRESSION: Moderate cardiomegaly and medium-sized right pleural effusion. Central pulmonary vascular congestion without overt pulmonary edema. Electronically Signed   By: Ulyses Jarred M.D.   On: 12/05/2017 19:57   Ct Angio Chest Pe W And/or Wo Contrast  Result Date: 12/05/2017 CLINICAL DATA:  Shortness of breath 3 days.  Mild hypoxia. EXAM: CT ANGIOGRAPHY CHEST WITH CONTRAST TECHNIQUE: Multidetector CT imaging of the chest was performed using the standard protocol during bolus administration of intravenous contrast. Multiplanar CT image reconstructions and MIPs were obtained to evaluate the vascular anatomy. CONTRAST:  98 mL  ISOVUE-370 IOPAMIDOL (ISOVUE-370) INJECTION 76% COMPARISON:  CT abdomen 06/24/2014 FINDINGS: Cardiovascular: Mild cardiomegaly. Calcified plaque over the left main, left anterior descending and right coronary arteries. Calcified plaque over the thoracic aorta. No evidence of pulmonary embolism. Mediastinum/Nodes: 1 cm right paratracheal lymph node. No mediastinal adenopathy. Remaining mediastinal structures are within normal. Lungs/Pleura: Lungs are adequately inflated demonstrate a moderate size right pleural effusion with mild associated atelectasis in the right base. Calcified granuloma over the right base. Left lung is clear. Airways are normal. Upper Abdomen: No acute abnormality. Musculoskeletal: Mild degenerate change of the spine. There is a mild compression fracture in the region of the thoracolumbar junction new since the previous exam, but age indeterminate. Review of the MIP images confirms the above findings. IMPRESSION: No evidence of pulmonary embolism. Moderate size right pleural effusion with right basilar atelectasis. Mild cardiomegaly with atherosclerotic coronary artery disease. Aortic Atherosclerosis (ICD10-I70.0). Mild compression fracture in the region of the thoracolumbar junction new since the previous exam, but age indeterminate. Electronically Signed   By: Quillian Quince  Derrel Nip M.D.   On: 12/05/2017 20:59    Scheduled Meds: . atorvastatin  20 mg Oral QPM  . fluticasone  1 spray Each Nare BID  . furosemide  40 mg Intravenous Daily  . sodium chloride flush  3 mL Intravenous Q12H    Continuous Infusions: . sodium chloride 250 mL (12/06/17 0618)  . norepinephrine (LEVOPHED) Adult infusion Stopped (12/05/17 2122)     Time spent: 11mins I have personally reviewed and interpreted on  12/06/2017 daily labs, tele strips, imagings as discussed above under date review session and assessment and plans.  I reviewed all nursing notes, pharmacy notes, consultant notes,  vitals, pertinent old  records  I have discussed plan of care as described above with RN , patient  on 12/06/2017   Florencia Reasons MD, PhD  [facility] Pager [phone]. If 7PM-7AM, please contact night-coverage at [email or web address], password [facility] 12/06/2017, 8:33 AM  LOS: 1 day

## 2017-12-06 NOTE — ED Notes (Signed)
ED TO INPATIENT HANDOFF REPORT  Name/Age/Gender [name] 84 y.o. male  Code Status    Code Status Orders  (From admission, onward)        Start     Ordered   12/06/17 0525  Full code  Continuous     12/06/17 0524    Code Status History    Date Active Date Inactive Code Status Order ID Comments User Context   01/03/2017 0523 01/07/2017 1912 Full Code [id number]  Ivor Costa, MD ED      Home/SNF/Other Home  Chief Complaint SOB; sent by pcp   Level of Care/Admitting Diagnosis ED Disposition    ED Disposition Condition [place]: [facility] [[id number]]  Level of Care: Stepdown [14]  Admit to SDU based on following criteria: Respiratory Distress:  Frequent assessment and/or intervention to maintain adequate ventilation/respiration, pulmonary toilet, and respiratory treatment.  Diagnosis: Acute respiratory failure with hypoxia [facility]) [[id number]]  Admitting Physician: Toy Baker [[id number]]  Attending Physician: Toy Baker [[id number]]  Estimated length of stay: 3 - 4 days  Certification:: I certify this patient will need inpatient services for at least 2 midnights  PT Class (Do Not Modify): Inpatient [101]  PT Acc Code (Do Not Modify): Private [1]       Medical History Past Medical History:  Diagnosis Date  . Arthritis   . Bladder cancer ([place])   . CAD (coronary artery disease)    cath 02/28/11: dLM 10%, pLAD 20%, pRCA calcified nodule 50% (FFR 0.86 - not significant), mRCA 40-50%, mildly elevated filling pressures, EF 50-55%  . Cataract   . Chronic kidney disease   . Gait abnormality 05/26/2017  . Hard of hearing   . Hyperlipidemia   . Hypertension    LOV with EKG 06/11/11 Dr Caryl Comes  [facility]  . Memory difficulty 05/26/2017  . Prostate cancer ([place])    status post brachytherapy---in 2003  . Sleep apnea    STOP BANG SCORE 4  . Ventricular tachycardia, nonsustained/bigeminy    echo 6/12: EF 55%, basal inferolat AK, mild LVH     Allergies Allergies  Allergen Reactions  . Neosporin [Neomycin-Bacitracin Zn-Polymyx] Other (See Comments)    Makes his skin red   . Penicillins Hives and Itching  . Penicillin G Rash    IV Location/Drains/Wounds Patient Lines/Drains/Airways Status   Active Line/Drains/Airways    Name:   Placement date:   Placement time:   Site:   Days:   Peripheral IV 12/05/17 Left Forearm   12/05/17    -    Forearm   1   External Urinary Catheter   12/06/17    0556    -   less than 1          Labs/Imaging Results for orders placed or performed during the hospital encounter of 12/05/17 (from the past 48 hour(s))  CBC with Differential     Status: Abnormal   Collection Time: 12/05/17  7:36 PM  Result Value Ref Range   WBC 11.7 (H) 4.0 - 10.5 K/uL   RBC 4.74 4.22 - 5.81 MIL/uL   Hemoglobin 15.3 13.0 - 17.0 g/dL   HCT 47.6 39.0 - 52.0 %   MCV 100.4 (H) 78.0 - 100.0 fL   MCH 32.3 26.0 - 34.0 pg   MCHC 32.1 30.0 - 36.0 g/dL   RDW 14.9 11.5 - 15.5 %   Platelets 183 150 - 400 K/uL   Neutrophils Relative % 65 %  Neutro Abs 7.7 1.7 - 7.7 K/uL   Lymphocytes Relative 16 %   Lymphs Abs 1.8 0.7 - 4.0 K/uL   Monocytes Relative 12 %   Monocytes Absolute 1.4 (H) 0.1 - 1.0 K/uL   Eosinophils Relative 6 %   Eosinophils Absolute 0.7 0.0 - 0.7 K/uL   Basophils Relative 1 %   Basophils Absolute 0.1 0.0 - 0.1 K/uL    Comment: Performed at [facility], [place] [address] [place], [place] [address]  Comprehensive metabolic panel     Status: Abnormal   Collection Time: 12/05/17  7:36 PM  Result Value Ref Range   Sodium 136 135 - 145 mmol/L   Potassium 4.7 3.5 - 5.1 mmol/L   Chloride 97 (L) 101 - 111 mmol/L   CO2 30 22 - 32 mmol/L   Glucose, Bld 109 (H) 65 - 99 mg/dL   BUN 20 6 - 20 mg/dL   Creatinine, Ser 0.98 0.61 - 1.24 mg/dL   Calcium 9.1 8.9 - 10.3 mg/dL   Total Protein 6.4 (L) 6.5 - 8.1 g/dL   Albumin 3.8 3.5 - 5.0 g/dL   AST 32 15 - 41 U/L   ALT 29 17 - 63 U/L    Alkaline Phosphatase 100 38 - 126 U/L   Total Bilirubin 0.8 0.3 - 1.2 mg/dL   GFR calc non Af Amer >60 >60 mL/min   GFR calc Af Amer >60 >60 mL/min    Comment: (NOTE) The eGFR has been calculated using the CKD EPI equation. This calculation has not been validated in all clinical situations. eGFR's persistently <60 mL/min signify possible Chronic Kidney Disease.    Anion gap 9 5 - 15    Comment: Performed at [facility], [place] [address], [place], [place] [address]  Lipase, blood     Status: None   Collection Time: 12/05/17  7:36 PM  Result Value Ref Range   Lipase 44 11 - 51 U/L    Comment: Performed at [facility], [place] [address], [place], [place] [address]  Protime-INR     Status: Abnormal   Collection Time: 12/05/17  7:36 PM  Result Value Ref Range   Prothrombin Time 15.3 (H) 11.4 - 15.2 seconds   INR 1.22     Comment: Performed at [facility], [place] [address] [place], [place] [address]  Magnesium     Status: Abnormal   Collection Time: 12/05/17  7:36 PM  Result Value Ref Range   Magnesium 2.5 (H) 1.7 - 2.4 mg/dL    Comment: Performed at [facility], [place] [address], [place], [place] [address]  Brain natriuretic peptide     Status: Abnormal   Collection Time: 12/05/17  7:36 PM  Result Value Ref Range   B Natriuretic Peptide 1,182.7 (H) 0.0 - 100.0 pg/mL    Comment: Performed at [facility], [place] [address] [place], [place] [address]  D-dimer, quantitative (not at [facility])     Status: Abnormal   Collection Time: 12/05/17  7:36 PM  Result Value Ref Range   D-Dimer, Quant 0.86 (H) 0.00 - 0.50 ug/mL-FEU    Comment: (NOTE) At the manufacturer cut-off of 0.50 ug/mL FEU, this assay has been documented to exclude PE with a sensitivity and negative predictive value of 97 to 99%.  At this time, this assay has not been approved by the FDA to exclude DVT/VTE. Results should be correlated  with clinical presentation. Performed at [facility]  [place] [address], [place], [place] [address]   I-Stat Troponin, ED (not at [facility])     Status: None   Collection Time: 12/05/17  7:41 PM  Result Value Ref Range   Troponin i, poc 0.07 0.00 - 0.08 ng/mL   Comment 3            Comment: Due to the release kinetics of cTnI, a negative result within the first hours of the onset of symptoms does not rule out myocardial infarction with certainty. If myocardial infarction is still suspected, repeat the test at appropriate intervals.   Blood culture (routine x 2)     Status: None (Preliminary result)   Collection Time: 12/05/17  9:44 PM  Result Value Ref Range   Specimen Description      BLOOD LEFT HAND Performed at [place] [address], [place], [place] [address]    Special Requests      BOTTLES DRAWN AEROBIC AND ANAEROBIC Blood Culture results may not be optimal due to an inadequate volume of blood received in culture bottles Performed at [facility], [place] [address], [place], [place] [address]    Culture      NO GROWTH < 24 HOURS Performed at [place] [address], [place], [place] [address]    Report Status PENDING   Blood culture (routine x 2)     Status: None (Preliminary result)   Collection Time: 12/05/17  9:44 PM  Result Value Ref Range   Specimen Description      BLOOD RIGHT ANTECUBITAL Performed at [place] [address], [place], [place] [address]    Special Requests      BOTTLES DRAWN AEROBIC AND ANAEROBIC Blood Culture adequate volume Performed at [place] [address] [place], [place] [address]    Culture      NO GROWTH < 24 HOURS Performed at [place] [address], [place], [place] [address]    Report Status PENDING   Troponin I (q 6hr x 3)     Status: Abnormal   Collection Time: 12/05/17  9:44 PM  Result Value Ref Range    Troponin I 0.08 (HH) <0.03 ng/mL    Comment: CRITICAL VALUE NOTED.  VALUE IS CONSISTENT WITH PREVIOUSLY REPORTED AND CALLED VALUE. Performed at [facility], [place] [address] [place], [place] [address]   I-Stat Troponin, ED (not at [facility])     Status: None   Collection Time: 12/05/17 10:03 PM  Result Value Ref Range   Troponin i, poc 0.06 0.00 - 0.08 ng/mL   Comment 3            Comment: Due to the release kinetics of cTnI, a negative result within the first hours of the onset of symptoms does not rule out myocardial infarction with certainty. If myocardial infarction is still suspected, repeat the test at appropriate intervals.   I-Stat CG4 Lactic Acid, ED     Status: None   Collection Time: 12/05/17 10:18 PM  Result Value Ref Range   Lactic Acid, Venous 1.49 0.5 - 1.9 mmol/L  Urinalysis, Routine w reflex microscopic     Status: Abnormal   Collection Time: 12/05/17 10:39 PM  Result Value Ref Range   Color, Urine YELLOW YELLOW   APPearance CLEAR CLEAR   Specific Gravity, Urine >1.046 (H) 1.005 - 1.030   pH 5.0 5.0 - 8.0   Glucose, UA  NEGATIVE NEGATIVE mg/dL   Hgb urine dipstick NEGATIVE NEGATIVE   Bilirubin Urine NEGATIVE NEGATIVE   Ketones, ur NEGATIVE NEGATIVE mg/dL   Protein, ur 100 (A) NEGATIVE mg/dL   Nitrite NEGATIVE NEGATIVE   Leukocytes, UA NEGATIVE NEGATIVE   RBC / HPF 6-30 0 - 5 RBC/hpf   WBC, UA 0-5 0 - 5 WBC/hpf   Bacteria, UA RARE (A) NONE SEEN   Squamous Epithelial / LPF 0-5 (A) NONE SEEN   Mucus PRESENT    Hyaline Casts, UA PRESENT    Ca Oxalate Crys, UA PRESENT     Comment: Performed at [facility], [place] [address] [place], [place] [address]  Troponin I (q 6hr x 3)     Status: Abnormal   Collection Time: 12/06/17  4:36 AM  Result Value Ref Range   Troponin I 0.10 (HH) <0.03 ng/mL    Comment: CRITICAL VALUE NOTED.  VALUE IS CONSISTENT WITH PREVIOUSLY REPORTED AND CALLED VALUE. Performed at [facility], [place] [address] [place], [place] [address]   Magnesium     Status: None   Collection Time: 12/06/17  6:08 AM  Result Value Ref Range   Magnesium 2.3 1.7 - 2.4 mg/dL    Comment: Performed at [facility], [place] [address] [place], [place] [address]  Phosphorus     Status: None   Collection Time: 12/06/17  6:08 AM  Result Value Ref Range   Phosphorus 4.3 2.5 - 4.6 mg/dL    Comment: Performed at [facility], [place] [address], [place], [place] [address]  TSH     Status: None   Collection Time: 12/06/17  6:08 AM  Result Value Ref Range   TSH 2.090 0.350 - 4.500 uIU/mL    Comment: Performed by a 3rd Generation assay with a functional sensitivity of <=0.01 uIU/mL. Performed at [facility], [place] [address] [place], [place] [address]   Comprehensive metabolic panel     Status: Abnormal   Collection Time: 12/06/17  6:08 AM  Result Value Ref Range   Sodium 136 135 - 145 mmol/L   Potassium 4.6 3.5 - 5.1 mmol/L   Chloride 96 (L) 101 - 111 mmol/L   CO2 30 22 - 32 mmol/L   Glucose, Bld 117 (H) 65 - 99 mg/dL   BUN 18 6 - 20 mg/dL   Creatinine, Ser 0.92 0.61 - 1.24 mg/dL   Calcium 9.3 8.9 - 10.3 mg/dL   Total Protein 6.7 6.5 - 8.1 g/dL   Albumin 3.8 3.5 - 5.0 g/dL   AST 38 15 - 41 U/L   ALT 36 17 - 63 U/L   Alkaline Phosphatase 100 38 - 126 U/L   Total Bilirubin 0.9 0.3 - 1.2 mg/dL   GFR calc non Af Amer >60 >60 mL/min   GFR calc Af Amer >60 >60 mL/min    Comment: (NOTE) The eGFR has been calculated using the CKD EPI equation. This calculation has not been validated in all clinical situations. eGFR's persistently <60 mL/min signify possible Chronic Kidney Disease.    Anion gap 10 5 - 15    Comment: Performed at [facility], [place] [address], [place], [place] [address]  CBC     Status: Abnormal   Collection Time: 12/06/17  6:08 AM  Result Value Ref Range   WBC 10.2 4.0 - 10.5 K/uL   RBC 4.51 4.22 -  5.81 MIL/uL   Hemoglobin 15.0 13.0 - 17.0  g/dL   HCT 45.2 39.0 - 52.0 %   MCV 100.2 (H) 78.0 - 100.0 fL   MCH 33.3 26.0 - 34.0 pg   MCHC 33.2 30.0 - 36.0 g/dL   RDW 15.1 11.5 - 15.5 %   Platelets 170 150 - 400 K/uL    Comment: Performed at [facility], [place] [address] [place], [place] [address]  Troponin I (q 6hr x 3)     Status: Abnormal   Collection Time: 12/06/17  9:42 AM  Result Value Ref Range   Troponin I 0.09 (HH) <0.03 ng/mL    Comment: CRITICAL VALUE NOTED.  VALUE IS CONSISTENT WITH PREVIOUSLY REPORTED AND CALLED VALUE. Performed at [facility], [place] [address] [place], [place] [address]   Body fluid cell count with differential     Status: Abnormal   Collection Time: 12/06/17 10:15 AM  Result Value Ref Range   Fluid Type-FCT PLEURAL     Comment: RIGHT CORRECTED ON 03/23 AT 1045: PREVIOUSLY REPORTED AS Pleural R    Color, Fluid STRAW (A) YELLOW   Appearance, Fluid CLEAR CLEAR   WBC, Fluid 698 0 - 1,000 cu mm   Neutrophil Count, Fluid 6 0 - 25 %   Lymphs, Fluid 26 %   Monocyte-Macrophage-Serous Fluid 68 50 - 90 %   Eos, Fluid 0 %   Other Cells, Fluid CORRELATE WITH CYTOLOGY. %    Comment: Performed at [facility], [place] [address] [place], [place] [address]   Dg Chest 2 View  Result Date: 12/05/2017 CLINICAL DATA:  Shortness of breath EXAM: CHEST - 2 VIEW COMPARISON:  Chest radiograph 01/03/2017 FINDINGS: Moderate cardiomegaly with medium-sized right pleural effusion. Central pulmonary vascular congestion. Right basilar atelectasis. No pneumothorax. IMPRESSION: Moderate cardiomegaly and medium-sized right pleural effusion. Central pulmonary vascular congestion without overt pulmonary edema. Electronically Signed   By: Ulyses Jarred M.D.   On: 12/05/2017 19:57   Ct Angio Chest Pe W And/or Wo Contrast  Result Date: 12/05/2017 CLINICAL DATA:  Shortness of breath 3 days.  Mild hypoxia. EXAM: CT ANGIOGRAPHY CHEST  WITH CONTRAST TECHNIQUE: Multidetector CT imaging of the chest was performed using the standard protocol during bolus administration of intravenous contrast. Multiplanar CT image reconstructions and MIPs were obtained to evaluate the vascular anatomy. CONTRAST:  98 mL ISOVUE-370 IOPAMIDOL (ISOVUE-370) INJECTION 76% COMPARISON:  CT abdomen 06/24/2014 FINDINGS: Cardiovascular: Mild cardiomegaly. Calcified plaque over the left main, left anterior descending and right coronary arteries. Calcified plaque over the thoracic aorta. No evidence of pulmonary embolism. Mediastinum/Nodes: 1 cm right paratracheal lymph node. No mediastinal adenopathy. Remaining mediastinal structures are within normal. Lungs/Pleura: Lungs are adequately inflated demonstrate a moderate size right pleural effusion with mild associated atelectasis in the right base. Calcified granuloma over the right base. Left lung is clear. Airways are normal. Upper Abdomen: No acute abnormality. Musculoskeletal: Mild degenerate change of the spine. There is a mild compression fracture in the region of the thoracolumbar junction new since the previous exam, but age indeterminate. Review of the MIP images confirms the above findings. IMPRESSION: No evidence of pulmonary embolism. Moderate size right pleural effusion with right basilar atelectasis. Mild cardiomegaly with atherosclerotic coronary artery disease. Aortic Atherosclerosis (ICD10-I70.0). Mild compression fracture in the region of the thoracolumbar junction new since the previous exam, but age indeterminate. Electronically Signed   By: Marin Olp M.D.   On: 12/05/2017 20:59   Dg Chest Portable 1 View  Result Date: 12/06/2017 CLINICAL DATA:  Cough and shortness of breath for the past 3 days. EXAM: PORTABLE CHEST 1 VIEW COMPARISON:  Chest x-ray from yesterday. FINDINGS: Stable cardiomegaly. Improved central pulmonary vascular congestion. Interval decrease in size of now small right pleural effusion  status post thoracentesis. No pneumothorax. The left lung is clear. No acute osseous abnormality. IMPRESSION: 1. Interval decrease in size of now small right pleural effusion status post thoracentesis. No pneumothorax. 2. Improved central pulmonary vascular congestion. Electronically Signed   By: Titus Dubin M.D.   On: 12/06/2017 10:54   US Thoracentesis Asp Pleural Space W/img Guide  Result Date: 12/06/2017 INDICATION: Shortness of breath. History of bladder and prostate cancer. Right pleural effusion. Request for diagnostic and therapeutic thoracentesis. EXAM: ULTRASOUND GUIDED RIGHT THORACENTESIS MEDICATIONS: 1% Lidocaine = 10 mL COMPLICATIONS: None immediate. PROCEDURE: An ultrasound guided thoracentesis was thoroughly discussed with the patient and questions answered. The benefits, risks, alternatives and complications were also discussed. The patient understands and wishes to proceed with the procedure. Written consent was obtained. Ultrasound was performed to localize and mark an adequate pocket of fluid in the right chest. The area was then prepped and draped in the normal sterile fashion. 1% Lidocaine was used for local anesthesia. Under ultrasound guidance a 6 Fr Safe-T-Centesis catheter was introduced. Thoracentesis was performed. The catheter was removed and a dressing applied. FINDINGS: A total of approximately 600 mL of clear orange fluid was removed. Samples were sent to the laboratory as requested by the clinical team. IMPRESSION: Successful ultrasound guided right thoracentesis yielding 600 mL of pleural fluid. No pneumothorax on post procedure chest X-ray. Read by: Gareth Eagle, PA-C Electronically Signed   By: Markus Daft M.D.   On: 12/06/2017 11:27    Pending Labs Unresulted Labs (From admission, onward)   Start     Ordered   12/06/17 1033  PH, Body Fluid  R     12/06/17 1033   12/06/17 1033  Lactate dehydrogenase (pleural or peritoneal fluid)  R     12/06/17 1033   12/06/17 1033   Gram stain  R     12/06/17 1033   12/06/17 1033  Body fluid culture  R     12/06/17 1033   12/06/17 1033  Protein, pleural or peritoneal fluid  R     12/06/17 1033   12/06/17 1033  Glucose, pleural or peritoneal fluid  R     12/06/17 1033   12/05/17 1907  Urine culture  STAT,   STAT     12/05/17 1906      Vitals/Pain Today's Vitals   12/06/17 1201 12/06/17 1232 12/06/17 1300 12/06/17 1330  BP:  118/77 (!) 126/110 133/79  Pulse: (!) 57 74 71 (!) 28  Resp: (!) 25 (!) 25 (!) 24 (!) 22  Temp:      TempSrc:      SpO2: 95% 100% 97% 94%  Weight:      Height:      PainSc:        Isolation Precautions No active isolations  Medications Medications  norepinephrine (LEVOPHED) 76m in D5W 258mpremix infusion (0 mcg/min Intravenous Stopped 12/05/17 2122)  furosemide (LASIX) injection 40 mg (40 mg Intravenous Given 12/06/17 0915)  atorvastatin (LIPITOR) tablet 20 mg (has no administration in time range)  acetaminophen (TYLENOL) tablet 650 mg (has no administration in time range)    Or  acetaminophen (TYLENOL) suppository 650 mg (has no administration in time range)  HYDROcodone-acetaminophen (NORCO/VICODIN) 5-325 MG per tablet 1-2 tablet (has no  administration in time range)  ondansetron (ZOFRAN) tablet 4 mg (has no administration in time range)    Or  ondansetron (ZOFRAN) injection 4 mg (has no administration in time range)  sodium chloride flush (NS) 0.9 % injection 3 mL (3 mLs Intravenous Given 12/06/17 0908)  sodium chloride flush (NS) 0.9 % injection 3 mL (has no administration in time range)  0.9 %  sodium chloride infusion (250 mLs Intravenous New Bag/Given 12/06/17 0618)  bisacodyl (DULCOLAX) suppository 10 mg (has no administration in time range)  polyethylene glycol (MIRALAX / GLYCOLAX) packet 17 g (has no administration in time range)  albuterol (PROVENTIL) (2.5 MG/3ML) 0.083% nebulizer solution 2.5 mg (has no administration in time range)  fluticasone (FLONASE) 50 MCG/ACT  nasal spray 1 spray (1 spray Each Nare Given 12/06/17 0915)  albuterol (PROVENTIL) (2.5 MG/3ML) 0.083% nebulizer solution 5 mg (5 mg Nebulization Given 12/05/17 1903)  norepinephrine (LEVOPHED) 4 mg in dextrose 5 % 250 mL (0.016 mg/mL) infusion (0 mcg/min Intravenous Stopped 12/05/17 2137)  iopamidol (ISOVUE-370) 76 % injection (100 mLs  Contrast Given 12/05/17 2039)  furosemide (LASIX) injection 40 mg (40 mg Intravenous Given 12/06/17 0257)  lidocaine (XYLOCAINE) 1 % (with pres) injection (  Given 12/06/17 0938)    Mobility walks

## 2017-12-07 DIAGNOSIS — I5023 Acute on chronic systolic (congestive) heart failure: Secondary | ICD-10-CM

## 2017-12-07 LAB — BASIC METABOLIC PANEL
Anion gap: 7 (ref 5–15)
BUN: 18 mg/dL (ref 6–20)
CHLORIDE: 93 mmol/L — AB (ref 101–111)
CO2: 33 mmol/L — ABNORMAL HIGH (ref 22–32)
CREATININE: 1 mg/dL (ref 0.61–1.24)
Calcium: 8.8 mg/dL — ABNORMAL LOW (ref 8.9–10.3)
GFR calc Af Amer: >60 mL/min (ref >60)
GFR calc non Af Amer: >60 mL/min (ref >60)
GLUCOSE: 141 mg/dL — AB (ref 65–99)
POTASSIUM: 4.1 mmol/L (ref 3.5–5.1)
Sodium: 133 mmol/L — ABNORMAL LOW (ref 135–145)

## 2017-12-07 LAB — URINE CULTURE

## 2017-12-07 LAB — CORTISOL: CORTISOL PLASMA: 11.3 ug/dL

## 2017-12-07 LAB — VITAMIN B12: Vitamin B-12: 402 pg/mL (ref 180–914)

## 2017-12-07 LAB — FOLATE: Folate: 15.3 ng/mL (ref >5.9)

## 2017-12-07 NOTE — Progress Notes (Signed)
PROGRESS NOTE  [name] OVZ:[id number] DOB: May 31, 1927 DOA: 12/05/2017 PCP: Tobie Lords D, FNP  Brief summary:   Patient is sent to the ER from [place] office due to sob and hypoxia  While in ER: Noted to be hypoxic and started on 2 L of nasal cannula seem to have improved transiently was noted to be hypotensive in the emergency department this felt to be most likely erroneous secondary to bigeminy and inability of blood pressure cuff to read appropriately.  But given transient hypotension he was started on levo fed this was discontinued as blood pressure noted to be normal on manual reading and patient clinically appeared to be without any evidence of distress Noted to be in bigeminy in the emergency department heart rates in's 40s Initially critical care was called but after further discussion Levophed was discontinued and plan was to admit to medicine      HPI/Recap of past 24 hours:  2.9liter urine output last 24hrs, bilateral lower extremity edema is improving Continue to have bradycardia No fever, denies pain, No hypoxia, blood pressure stable  Assessment/Plan: Active Problems:   Ventricular bigeminy   Nonobstructive CAD (coronary artery disease)   Hypertension   Hyperlipidemia   Bladder cancer (HCC)   Atrial fibrillation ([place])   Memory difficulty   Pleural effusion on right   Acute respiratory failure with hypoxia (HCC)  Acute hypoxic respiratory failure likely from acute on chronic systolic chf -he is sent from pmd office due to sob, hypoxia, 88% on room air -CTA negative for PE,  Mild cardiomegaly with atherosclerotic coronary artery disease.  -Troponin mildly elevated. bnp 1182. -he denies chest pain, he does has bilateral lower extremity pitting edema, asymmetrical pleural effusion on right -s/p therapeutic and diagnostic US guided thoracentesis with 600cc clear orange fluid, fluids study consistent with transudates, gram stain negative , culture in  process, cytology pending. No fever, denies pain. -continue on iv lasix, monitor urine output, cr -cardiology consulted , input appreciated  bp low normal , patient has bradycardia,  Now has reduced EF, will follow cardiology recommendation regarding coreg, entresto.   Bigeminy, bradycardia , h/o NSVT, h/o PAF -stop aricept -keep K.4, mag >2,  tsh2 -he was not anticoagulation due to history of falls and dementia -keep on tele -cardiology consulted, meds per cardiology  CAD: last cath in 2012 Currently denies chest pain, mild troponin elevation likely from chf exacerbation Continue asa, statin Cards following  Macrocytosis: b12/folate in process.  Mild compression fracture in the region of the thoracolumbar junction new since the previous exam, but age indeterminate Incidental finding on CTA chest Patient denies back pain  Dementia : pleasantly demented, not oriented to month D/c aricept due to bradycardia  H/o prostate cancer s/p brachytherapy H/o bladder cancer s/p resection  FTT: start PT eval   Code Status: full  Family Communication: patient   Disposition Plan: transfer out of  Stepdown to med tele, start PT eval, likely able to d/c on 3/25 with cardiology clearance   Consultants:  cardiology  Procedures:  US guided thoracentesis on right on 3/23  Antibiotics:  none   Objective: BP (!) 129/51   Pulse (!) 46   Temp 97.6 F (36.4 C) (Oral)   Resp (!) 37   Ht 5\' 10"  (1.778 m)   Wt 92.7 kg (204 lb 5.9 oz)   SpO2 99%   BMI 29.32 kg/m   Intake/Output Summary (Last 24 hours) at 12/07/2017 0742 Last data filed at 12/07/2017 0550 Gross  per 24 hour  Intake 0 ml  Output 2900 ml  Net -2900 ml   Filed Weights   12/05/17 1836 12/06/17 0552 12/07/17 0600  Weight: 87.1 kg (192 lb) 87.1 kg (192 lb) 92.7 kg (204 lb 5.9 oz)    Exam: Patient is examined daily including today on 12/07/2017, exams remain the same as of yesterday except that has changed     General:  NAD, pleasantly demented, not oriented to month  Cardiovascular: bradycardia with ectopy  Respiratory:  Diminished at basis, no wheezing, no rhonchi  Abdomen: Soft/ND/NT, positive BS  Musculoskeletal: bilateral lower extremity pitting  Edema improving  Neuro: alert, pleasantly demented, not oriented to month  Data Reviewed: Basic Metabolic Panel: Recent Labs  Lab 12/05/17 1936 12/06/17 0608  NA 136 136  K 4.7 4.6  CL 97* 96*  CO2 30 30  GLUCOSE 109* 117*  BUN 20 18  CREATININE 0.98 0.92  CALCIUM 9.1 9.3  MG 2.5* 2.3  PHOS  --  4.3   Liver Function Tests: Recent Labs  Lab 12/05/17 1936 12/06/17 0608  AST 32 38  ALT 29 36  ALKPHOS 100 100  BILITOT 0.8 0.9  PROT 6.4* 6.7  ALBUMIN 3.8 3.8   Recent Labs  Lab 12/05/17 1936  LIPASE 44   No results for input(s): AMMONIA in the last 168 hours. CBC: Recent Labs  Lab 12/05/17 1936 12/06/17 0608  WBC 11.7* 10.2  NEUTROABS 7.7  --   HGB 15.3 15.0  HCT 47.6 45.2  MCV 100.4* 100.2*  PLT 183 170   Cardiac Enzymes:   Recent Labs  Lab 12/05/17 2144 12/06/17 0436 12/06/17 0942  TROPONINI 0.08* 0.10* 0.09*   BNP (last 3 results) Recent Labs    12/05/17 1936  BNP 1,182.7*    ProBNP (last 3 results) No results for input(s): PROBNP in the last 8760 hours.  CBG: No results for input(s): GLUCAP in the last 168 hours.  Recent Results (from the past 240 hour(s))  Blood culture (routine x 2)     Status: None (Preliminary result)   Collection Time: 12/05/17  9:44 PM  Result Value Ref Range Status   Specimen Description   Final    BLOOD LEFT HAND Performed at [facility] [address], [place], [place] [address]    Special Requests   Final    BOTTLES DRAWN AEROBIC AND ANAEROBIC Blood Culture results may not be optimal due to an inadequate volume of blood received in culture bottles Performed at [facility] [address], [place], [place]  [address]    Culture   Final    NO GROWTH < 24 HOURS Performed at [facility] [address], [place], [place] [address]    Report Status PENDING  Incomplete  Blood culture (routine x 2)     Status: None (Preliminary result)   Collection Time: 12/05/17  9:44 PM  Result Value Ref Range Status   Specimen Description   Final    BLOOD RIGHT ANTECUBITAL Performed at [facility] [address], [place], [place] [address]    Special Requests   Final    BOTTLES DRAWN AEROBIC AND ANAEROBIC Blood Culture adequate volume Performed at [facility] [address], [place], [place] [address]    Culture   Final    NO GROWTH < 24 HOURS Performed at [facility] [address], [place],  [address]    Report Status  PENDING  Incomplete  Gram stain     Status: None   Collection Time: 12/06/17 10:15 AM  Result Value Ref Range Status   Specimen Description PLEURAL RIGHT  Final   Special Requests NONE  Final   Gram Stain   Final    FEW WBC PRESENT,BOTH PMN AND MONONUCLEAR NO ORGANISMS SEEN Performed at [facility], [address], [place], [place] [address]    Report Status 12/06/2017 FINAL  Final  MRSA PCR Screening     Status: None   Collection Time: 12/06/17  2:46 PM  Result Value Ref Range Status   MRSA by PCR NEGATIVE NEGATIVE Final    Comment:        The GeneXpert MRSA Assay (FDA approved for NASAL specimens only), is one component of a comprehensive MRSA colonization surveillance program. It is not intended to diagnose MRSA infection nor to guide or monitor treatment for MRSA infections. Performed at [facility], [place] [address] [place], [place] [address]      Studies: Dg Chest Portable 1 View  Result Date: 12/06/2017 CLINICAL DATA:  Cough and shortness of breath for the past 3 days. EXAM: PORTABLE CHEST 1 VIEW COMPARISON:  Chest x-ray from yesterday. FINDINGS: Stable cardiomegaly. Improved  central pulmonary vascular congestion. Interval decrease in size of now small right pleural effusion status post thoracentesis. No pneumothorax. The left lung is clear. No acute osseous abnormality. IMPRESSION: 1. Interval decrease in size of now small right pleural effusion status post thoracentesis. No pneumothorax. 2. Improved central pulmonary vascular congestion. Electronically Signed   By: Titus Dubin M.D.   On: 12/06/2017 10:54   US Thoracentesis Asp Pleural Space W/img Guide  Result Date: 12/06/2017 INDICATION: Shortness of breath. History of bladder and prostate cancer. Right pleural effusion. Request for diagnostic and therapeutic thoracentesis. EXAM: ULTRASOUND GUIDED RIGHT THORACENTESIS MEDICATIONS: 1% Lidocaine = 10 mL COMPLICATIONS: None immediate. PROCEDURE: An ultrasound guided thoracentesis was thoroughly discussed with the patient and questions answered. The benefits, risks, alternatives and complications were also discussed. The patient understands and wishes to proceed with the procedure. Written consent was obtained. Ultrasound was performed to localize and mark an adequate pocket of fluid in the right chest. The area was then prepped and draped in the normal sterile fashion. 1% Lidocaine was used for local anesthesia. Under ultrasound guidance a 6 Fr Safe-T-Centesis catheter was introduced. Thoracentesis was performed. The catheter was removed and a dressing applied. FINDINGS: A total of approximately 600 mL of clear orange fluid was removed. Samples were sent to the laboratory as requested by the clinical team. IMPRESSION: Successful ultrasound guided right thoracentesis yielding 600 mL of pleural fluid. No pneumothorax on post procedure chest X-ray. Read by: Gareth Eagle, PA-C Electronically Signed   By: Markus Daft M.D.   On: 12/06/2017 11:27    Scheduled Meds: . aspirin EC  81 mg Oral Daily  . atorvastatin  20 mg Oral QPM  . fluticasone  1 spray Each Nare BID  . furosemide  40  mg Intravenous Daily  . sodium chloride flush  3 mL Intravenous Q12H    Continuous Infusions: . sodium chloride 250 mL (12/06/17 0618)     Time spent: 17mins I have personally reviewed and interpreted on  12/07/2017 daily labs, tele strips, imagings as discussed above under date review session and assessment and plans.  I reviewed all nursing notes, pharmacy notes, consultant notes,  vitals, pertinent old records  I have discussed plan of care  as described above with RN , patient  on 12/07/2017   Florencia Reasons MD, PhD  [facility] Pager [phone]. If 7PM-7AM, please contact night-coverage at [email or web address], password [facility] 12/07/2017, 7:42 AM  LOS: 2 days

## 2017-12-07 NOTE — Progress Notes (Signed)
Subjective:  Denies SSCP, palpitations or Dyspnea   Objective:  Vitals:   12/07/17 0000 12/07/17 0400 12/07/17 0405 12/07/17 0600  BP: 114/64 (!) 129/51    Pulse: 68 (!) 46    Resp: (!) 24 (!) 37    Temp:   97.6 F (36.4 C)   TempSrc:   Oral   SpO2: 100% 99%    Weight:    204 lb 5.9 oz (92.7 kg)  Height:        Intake/Output from previous day:  Intake/Output Summary (Last 24 hours) at 12/07/2017 9381 Last data filed at 12/07/2017 0550 Gross per 24 hour  Intake 0 ml  Output 2900 ml  Net -2900 ml    Physical Exam: Affect appropriate Healthy:  appears stated age HEENT: normal Neck supple with no adenopathy JVP normal no bruits no thyromegaly Lungs clear with no wheezing and good diaphragmatic motion Heart:  S1/S2 no murmur, no rub, gallop or click PMI normal Abdomen: benighn, BS positve, no tenderness, no AAA no bruit.  No HSM or HJR Distal pulses intact with no bruits No edema Neuro non-focal Skin warm and dry No muscular weakness   Lab Results: Basic Metabolic Panel: Recent Labs    12/05/17 1936 12/06/17 0608  NA 136 136  K 4.7 4.6  CL 97* 96*  CO2 30 30  GLUCOSE 109* 117*  BUN 20 18  CREATININE 0.98 0.92  CALCIUM 9.1 9.3  MG 2.5* 2.3  PHOS  --  4.3   Liver Function Tests: Recent Labs    12/05/17 1936 12/06/17 0608  AST 32 38  ALT 29 36  ALKPHOS 100 100  BILITOT 0.8 0.9  PROT 6.4* 6.7  ALBUMIN 3.8 3.8   Recent Labs    12/05/17 1936  LIPASE 44   CBC: Recent Labs    12/05/17 1936 12/06/17 0608  WBC 11.7* 10.2  NEUTROABS 7.7  --   HGB 15.3 15.0  HCT 47.6 45.2  MCV 100.4* 100.2*  PLT 183 170   Cardiac Enzymes: Recent Labs    12/05/17 2144 12/06/17 0436 12/06/17 0942  TROPONINI 0.08* 0.10* 0.09*   BNP: Invalid input(s): POCBNP D-Dimer: Recent Labs    12/05/17 1936  DDIMER 0.86*   Hemoglobin A1C: No results for input(s): HGBA1C in the last 72 hours. Fasting Lipid Panel: No results for input(s): CHOL, HDL,  LDLCALC, TRIG, CHOLHDL, LDLDIRECT in the last 72 hours. Thyroid Function Tests: Recent Labs    12/06/17 0608  TSH 2.090   Anemia Panel: No results for input(s): VITAMINB12, FOLATE, FERRITIN, TIBC, IRON, RETICCTPCT in the last 72 hours.  Imaging: Dg Chest 2 View  Result Date: 12/05/2017 CLINICAL DATA:  Shortness of breath EXAM: CHEST - 2 VIEW COMPARISON:  Chest radiograph 01/03/2017 FINDINGS: Moderate cardiomegaly with medium-sized right pleural effusion. Central pulmonary vascular congestion. Right basilar atelectasis. No pneumothorax. IMPRESSION: Moderate cardiomegaly and medium-sized right pleural effusion. Central pulmonary vascular congestion without overt pulmonary edema. Electronically Signed   By: Ulyses Jarred M.D.   On: 12/05/2017 19:57   Ct Angio Chest Pe W And/or Wo Contrast  Result Date: 12/05/2017 CLINICAL DATA:  Shortness of breath 3 days.  Mild hypoxia. EXAM: CT ANGIOGRAPHY CHEST WITH CONTRAST TECHNIQUE: Multidetector CT imaging of the chest was performed using the standard protocol during bolus administration of intravenous contrast. Multiplanar CT image reconstructions and MIPs were obtained to evaluate the vascular anatomy. CONTRAST:  98 mL ISOVUE-370 IOPAMIDOL (ISOVUE-370) INJECTION 76% COMPARISON:  CT abdomen 06/24/2014 FINDINGS: Cardiovascular:  Mild cardiomegaly. Calcified plaque over the left main, left anterior descending and right coronary arteries. Calcified plaque over the thoracic aorta. No evidence of pulmonary embolism. Mediastinum/Nodes: 1 cm right paratracheal lymph node. No mediastinal adenopathy. Remaining mediastinal structures are within normal. Lungs/Pleura: Lungs are adequately inflated demonstrate a moderate size right pleural effusion with mild associated atelectasis in the right base. Calcified granuloma over the right base. Left lung is clear. Airways are normal. Upper Abdomen: No acute abnormality. Musculoskeletal: Mild degenerate change of the spine. There  is a mild compression fracture in the region of the thoracolumbar junction new since the previous exam, but age indeterminate. Review of the MIP images confirms the above findings. IMPRESSION: No evidence of pulmonary embolism. Moderate size right pleural effusion with right basilar atelectasis. Mild cardiomegaly with atherosclerotic coronary artery disease. Aortic Atherosclerosis (ICD10-I70.0). Mild compression fracture in the region of the thoracolumbar junction new since the previous exam, but age indeterminate. Electronically Signed   By: Marin Olp M.D.   On: 12/05/2017 20:59   Dg Chest Portable 1 View  Result Date: 12/06/2017 CLINICAL DATA:  Cough and shortness of breath for the past 3 days. EXAM: PORTABLE CHEST 1 VIEW COMPARISON:  Chest x-ray from yesterday. FINDINGS: Stable cardiomegaly. Improved central pulmonary vascular congestion. Interval decrease in size of now small right pleural effusion status post thoracentesis. No pneumothorax. The left lung is clear. No acute osseous abnormality. IMPRESSION: 1. Interval decrease in size of now small right pleural effusion status post thoracentesis. No pneumothorax. 2. Improved central pulmonary vascular congestion. Electronically Signed   By: Titus Dubin M.D.   On: 12/06/2017 10:54   US Thoracentesis Asp Pleural Space W/img Guide  Result Date: 12/06/2017 INDICATION: Shortness of breath. History of bladder and prostate cancer. Right pleural effusion. Request for diagnostic and therapeutic thoracentesis. EXAM: ULTRASOUND GUIDED RIGHT THORACENTESIS MEDICATIONS: 1% Lidocaine = 10 mL COMPLICATIONS: None immediate. PROCEDURE: An ultrasound guided thoracentesis was thoroughly discussed with the patient and questions answered. The benefits, risks, alternatives and complications were also discussed. The patient understands and wishes to proceed with the procedure. Written consent was obtained. Ultrasound was performed to localize and mark an adequate pocket  of fluid in the right chest. The area was then prepped and draped in the normal sterile fashion. 1% Lidocaine was used for local anesthesia. Under ultrasound guidance a 6 Fr Safe-T-Centesis catheter was introduced. Thoracentesis was performed. The catheter was removed and a dressing applied. FINDINGS: A total of approximately 600 mL of clear orange fluid was removed. Samples were sent to the laboratory as requested by the clinical team. IMPRESSION: Successful ultrasound guided right thoracentesis yielding 600 mL of pleural fluid. No pneumothorax on post procedure chest X-ray. Read by: Gareth Eagle, PA-C Electronically Signed   By: Markus Daft M.D.   On: 12/06/2017 11:27    Cardiac Studies:  ECG:  Orders placed or performed during the hospital encounter of 12/05/17  . EKG 12-Lead  . EKG 12-Lead  . ED EKG  . ED EKG  . EKG 12-Lead  . EKG 12-Lead  . EKG 12-Lead  . EKG     Telemetry:  NSR PAC;s PVC;s short 3-4 beats NSVT   Echo: EF 35-40% mild MR  Medications:   . aspirin EC  81 mg Oral Daily  . atorvastatin  20 mg Oral QPM  . fluticasone  1 spray Each Nare BID  . furosemide  40 mg Intravenous Daily  . sodium chloride flush  3 mL Intravenous Q12H     .  sodium chloride 250 mL (12/06/17 0618)    Assessment/Plan:   Bradycardia: relative due to ectopy chronic no indication for pacer CHF: clinically euvolemic start ACE due to low EF on echo continue lasix change to PO in am  PVC;s: chronic previously on propofenone d/c by Dr Caryl Comes asymptomatic observe Pleural Effusion: likely transudative post US guided thoracentesis no pneumothorax   Ok to d/c home from cardiology perspective   Jenkins Rouge 12/07/2017, 8:06 AM

## 2017-12-08 LAB — BASIC METABOLIC PANEL
ANION GAP: 7 (ref 5–15)
BUN: 18 mg/dL (ref 6–20)
CALCIUM: 8.6 mg/dL — AB (ref 8.9–10.3)
CHLORIDE: 91 mmol/L — AB (ref 101–111)
CO2: 33 mmol/L — AB (ref 22–32)
Creatinine, Ser: 0.99 mg/dL (ref 0.61–1.24)
GFR calc non Af Amer: >60 mL/min (ref >60)
GLUCOSE: 143 mg/dL — AB (ref 65–99)
Potassium: 3.9 mmol/L (ref 3.5–5.1)
Sodium: 131 mmol/L — ABNORMAL LOW (ref 135–145)

## 2017-12-08 LAB — PH, BODY FLUID: PH, BODY FLUID: 7.4

## 2017-12-08 LAB — MAGNESIUM: MAGNESIUM: 2.2 mg/dL (ref 1.7–2.4)

## 2017-12-08 LAB — BRAIN NATRIURETIC PEPTIDE: B Natriuretic Peptide: 666.7 pg/mL — ABNORMAL HIGH (ref 0.0–100.0)

## 2017-12-08 MED ORDER — FUROSEMIDE 20 MG PO TABS
20.0000 mg | ORAL_TABLET | Freq: Every day | ORAL | Status: DC
  Administered 2017-12-08 – 2017-12-09 (×2): 20 mg via ORAL
  Filled 2017-12-08 (×2): qty 1

## 2017-12-08 MED ORDER — LOSARTAN POTASSIUM 25 MG PO TABS
25.0000 mg | ORAL_TABLET | Freq: Every day | ORAL | Status: DC
  Administered 2017-12-08 – 2017-12-09 (×2): 25 mg via ORAL
  Filled 2017-12-08 (×2): qty 1

## 2017-12-08 NOTE — Evaluation (Signed)
Physical Therapy Evaluation Patient Details Name: [name] MRN: [id number] DOB: Aug 03, 1927 Today's Date: 12/08/2017   History of Present Illness  84 y.o. male with medical history significant of CAD and paroxysmal atrial fibrillation, dementia history of bladder cancer, CKD, HLD admitted with ventricular bigeminy  Clinical Impression  Pt admitted with above diagnosis. Pt currently with functional limitations due to the deficits listed below (see PT Problem List). Pt ambulated 120' with RW, no loss of balance, SaO2 86% on room air walking, 92% on 3L O2.   Pt will benefit from skilled PT to increase their independence and safety with mobility to allow discharge to the venue listed below.       Follow Up Recommendations Home health PT   Equipment Recommendations  Rolling walker with 5" wheels    Recommendations for Other Services       Precautions / Restrictions Precautions Precautions: Fall Precaution Comments: monitor O2 Restrictions Weight Bearing Restrictions: No      Mobility  Bed Mobility               General bed mobility comments: up in recliner  Transfers Overall transfer level: Needs assistance Equipment used: Rolling walker (2 wheeled) Transfers: Sit to/from Stand Sit to Stand: Min guard         General transfer comment: min/guard for safety; incontinent of urine upon standing  Ambulation/Gait Ambulation/Gait assistance: Min guard Ambulation Distance (Feet): 120 Feet Assistive device: Rolling walker (2 wheeled) Gait Pattern/deviations: Step-through pattern     General Gait Details: steady with RW, no LOB, SaO2 86% on room air, 92% on 3L O2  Stairs            Wheelchair Mobility    Modified Rankin (Stroke Patients Only)       Balance Overall balance assessment: Modified Independent                                           Pertinent Vitals/Pain Pain Assessment: No/denies pain    Home Living  Family/patient expects to be discharged to:: Private residence Living Arrangements: Spouse/significant other Available Help at Discharge: Available PRN/intermittently Type of Home: House Home Access: Stairs to enter Entrance Stairs-Rails: None Entrance Stairs-Number of Steps: 2 Home Layout: One level Home Equipment: None      Prior Function Level of Independence: Independent         Comments: "furniture walks"     Hand Dominance        Extremity/Trunk Assessment   Upper Extremity Assessment Upper Extremity Assessment: Overall WFL for tasks assessed    Lower Extremity Assessment Lower Extremity Assessment: Overall WFL for tasks assessed    Cervical / Trunk Assessment Cervical / Trunk Assessment: Kyphotic  Communication   Communication: HOH  Cognition Arousal/Alertness: Awake/alert Behavior During Therapy: WFL for tasks assessed/performed Overall Cognitive Status: Within Functional Limits for tasks assessed                                        General Comments      Exercises     Assessment/Plan    PT Assessment Patient needs continued PT services  PT Problem List Cardiopulmonary status limiting activity;Decreased activity tolerance       PT Treatment Interventions Gait training;Therapeutic activities;Therapeutic exercise;DME instruction    PT  Goals (Current goals can be found in the Care Plan section)  Acute Rehab PT Goals Patient Stated Goal: return home PT Goal Formulation: With patient Time For Goal Achievement: 12/22/17 Potential to Achieve Goals: Good    Frequency Min 3X/week   Barriers to discharge        Co-evaluation               AM-PAC PT "6 Clicks" Daily Activity  Outcome Measure Difficulty turning over in bed (including adjusting bedclothes, sheets and blankets)?: A Little Difficulty moving from lying on back to sitting on the side of the bed? : A Lot Difficulty sitting down on and standing up from a chair  with arms (e.g., wheelchair, bedside commode, etc,.)?: A Little Help needed moving to and from a bed to chair (including a wheelchair)?: A Little Help needed walking in hospital room?: A Little Help needed climbing 3-5 steps with a railing? : A Little 6 Click Score: 17    End of Session Equipment Utilized During Treatment: Gait belt;Oxygen Activity Tolerance: Patient tolerated treatment well Patient left: in bed;with call bell/phone within reach;with bed alarm set Nurse Communication: Mobility status PT Visit Diagnosis: Difficulty in walking, not elsewhere classified (R26.2)    Time: 4270-6237 PT Time Calculation (min) (ACUTE ONLY): 23 min   Charges:   PT Evaluation $PT Eval Low Complexity: 1 Low PT Treatments $Gait Training: 8-22 mins   PT G Codes:          Philomena Doheny 12/08/2017, 3:07 PM [phone]

## 2017-12-08 NOTE — Clinical Social Work Note (Signed)
Clinical Social Work Assessment  Patient Details  Name: [name] MRN: [id number] Date of Birth: October 15, 1926  Date of referral:  12/08/17               Reason for consult:  Facility Placement                Permission sought to share information with:  Facility Sport and exercise psychologist, Family Supports Permission granted to share information::  Yes, Verbal Permission Granted  Name::     [name]  Agency::     Relationship::  Wife  Contact Information:  [phone]  Housing/Transportation Living arrangements for the past 2 months:  Single Family Home Source of Information:  Spouse Patient Interpreter Needed:  None Criminal Activity/Legal Involvement Pertinent to Current Situation/Hospitalization:  No - Comment as needed Significant Relationships:  Spouse Lives with:  Spouse Do you feel safe going back to the place where you live?    Need for family participation in patient care:  Yes (Comment)  Care giving concerns:  Patient from home with wife. Patient's wife reported that prior to hospitalization patient bathed independently and required assistance with putting on shirt. Patient's wife reported that patient walked independently and sometimes used a cane. PT recommending home health PT. CSW consulted for SNF placement.    Social Worker assessment / plan:  CSW spoke with patient about PT recommendation for home health PT and consult for SNF placement, patient requested that CSW speak with his wife about discharge planning. CSW spoke with patient's wife about PT recommendation for home health PT and consult for SNF placement. Patient's wife reported that she prefer that patient go to SNF for rehab because patient did not complete exercises at home after PT sessions the last time patient had home health services. CSW explained to patient's wife that PT recommended home health and that patient's insurance would not cover SNF because SNF was not the recommendation, patient's wife  verbalized understanding. Patient's wife reported that she is agreeable to private paying for 2 weeks for patient to go to SNF near [facility], noting that she prefers [facility]. CSW agreed to contact [facility] to see if they had beds available. CSW explained to patient's wife that most SNFs require 30 days of payment upfront, patient's wife reported that she only wanted to pay for two weeks. CSW agreed to see if the preferred SNF had beds available and that if paying for two weeks was an option.   CSW contacted [facility] and spoke with staff member Joellen Jersey, staff reported no beds available.  CSW updated patient's wife, patient's wife requested that CSW check with other local SNFs to see if they had beds available and would accept two weeks private pay.   CSW contacted other local SNFs to get quotes. [facility] reported that they have beds available and would accept two weeks private pay. [facility] reported that they had beds available and would accept two weeks private pay.  CSW updated patient's wife, patient's wife reported that she would prefer to just take patient home versus patient going to available SNFs. Patient's wife reported that they will try home health services again. CSW agreed to notify patient's RNCM.  CSW notified patient's RNCM about patient's needs for home health services.  CSW signing off, no other needs identified at this time.  Employment status:  Retired Nurse, adult PT Recommendations:  Home with [facility] / Referral to community  resources:  [place]  Patient/Family's Response to care:  Patient's wife appreciative of CSW assistance with discharge planning.   Patient/Family's Understanding of and Emotional Response to Diagnosis, Current Treatment, and Prognosis:  Patient presented calm and deferred to his wife to discuss discharge planning. Patient's wife  verbalized some understanding of patient's current treatment and verbalized  plan for patient to discharge home with home health services.  Emotional Assessment Appearance:  Appears stated age Attitude/Demeanor/Rapport:  Other(Cooperative) Affect (typically observed):  Calm, Quiet Orientation:  Oriented to Self, Oriented to Place, Oriented to Situation Alcohol / Substance use:  Not Applicable Psych involvement (Current and /or in the community):  No (Comment)  Discharge Needs  Concerns to be addressed:  Care Coordination Readmission within the last 30 days:  No Current discharge risk:  None Barriers to Discharge:  Continued Medical Work up   [employer], LCSW 12/08/2017, 4:26 PM

## 2017-12-08 NOTE — Progress Notes (Signed)
PROGRESS NOTE  JABAR KRYSIAK NOB:[id number] DOB: 1927/07/29 DOA: 12/05/2017 PCP: Tobie Lords D, FNP  Brief summary:   Patient is sent to the ER from [facility] office due to sob and hypoxia  While in ER: Noted to be hypoxic and started on 2 L of nasal cannula seem to have improved transiently was noted to be hypotensive in the emergency department this felt to be most likely erroneous secondary to bigeminy and inability of blood pressure cuff to read appropriately.  But given transient hypotension he was started on levo fed this was discontinued as blood pressure noted to be normal on manual reading and patient clinically appeared to be without any evidence of distress Noted to be in bigeminy in the emergency department heart rates in's 40s Initially critical care was called but after further discussion Levophed was discontinued and plan was to admit to medicine      HPI/Recap of past 24 hours:  He is sitting up in chair, hard of hearing, reports feeling better  bilateral lower extremity edema has resolved SaO2 86% on room air walking, 92% on 3L O2 Continue to have bradycardia No fever, denies pain,   Assessment/Plan: Active Problems:   Ventricular bigeminy   Nonobstructive CAD (coronary artery disease)   Hypertension   Hyperlipidemia   Bladder cancer ([place])   Atrial fibrillation ([place])   Memory difficulty   Pleural effusion on right   Acute respiratory failure with hypoxia ([place])  Acute hypoxic respiratory failure likely from acute on chronic systolic chf -he is sent from pmd office due to sob, hypoxia, 88% on room air -CTA negative for PE,  Mild cardiomegaly with atherosclerotic coronary artery disease.  -Troponin mildly elevated. bnp 1182. -he denies chest pain, he does has bilateral lower extremity pitting edema, asymmetrical pleural effusion on right -s/p therapeutic and diagnostic US guided thoracentesis with 600cc clear orange fluid, fluids study consistent with  transudates, gram stain negative , culture no growth, cytology pending. No fever, denies pain. -he received iv lasix, now on oral lasix 20mg  daily, he is started on cozaar, not able to start coreg due to bradycardia -cardiology consulted , input appreciated, no further invasive study, recommend medical management. - I have discussed with wife and recommend close follow up with patient 's cardiology Dr Mauricio Po for further meds adjustment.  Bigeminy, bradycardia ,  NSVT, h/o PAF -stop aricept -keep K.4, mag >2,  tsh2 -he was not anticoagulation due to history of falls and dementia -not able to start coreg due to bradycardia -close follow up with cardiology  CAD: last cath in 2012 Currently denies chest pain, mild troponin elevation likely from chf exacerbation Continue asa, statin   Macrocytosis: b12/folate wnl, tsh wnl.  Mild compression fracture in the region of the thoracolumbar junction new since the previous exam, but age indeterminate Incidental finding on CTA chest Patient denies back pain  Dementia : pleasantly demented, not oriented to month D/c aricept due to bradycardia  H/o prostate cancer s/p brachytherapy H/o bladder cancer s/p resection  FTT: start PT eval   Code Status: full  Family Communication: patient   Disposition Plan: d/c on 3/26 with home o2, home health vs snf   Consultants:  cardiology  Procedures:  US guided thoracentesis on right on 3/23  Antibiotics:  none   Objective: BP 132/68 (BP Location: Right Arm)   Pulse 62   Temp 98 F (36.7 C) (Oral)   Resp 18   Ht 5\' 10"  (1.778 m)   Wt  92.7 kg (204 lb 5.9 oz)   SpO2 99%   BMI 29.32 kg/m   Intake/Output Summary (Last 24 hours) at 12/08/2017 1951 Last data filed at 12/08/2017 0900 Gross per 24 hour  Intake 360 ml  Output 51 ml  Net 309 ml   Filed Weights   12/06/17 0552 12/07/17 0600 12/08/17 0603  Weight: 87.1 kg (192 lb) 92.7 kg (204 lb 5.9 oz) 92.7 kg (204 lb 5.9 oz)     Exam: Patient is examined daily including today on 12/08/2017, exams remain the same as of yesterday except that has changed    General:  NAD, pleasantly demented, not oriented to month  Cardiovascular: bradycardia with ectopy  Respiratory:  Diminished at basis, no wheezing, no rhonchi  Abdomen: Soft/ND/NT, positive BS  Musculoskeletal: bilateral lower extremity pitting  Edema has resolved  Neuro: alert, pleasantly demented, not oriented to month  Data Reviewed: Basic Metabolic Panel: Recent Labs  Lab 12/05/17 1936 12/06/17 0608 12/07/17 0315 12/08/17 0415  NA 136 136 133* 131*  K 4.7 4.6 4.1 3.9  CL 97* 96* 93* 91*  CO2 30 30 33* 33*  GLUCOSE 109* 117* 141* 143*  BUN 20 18 18 18   CREATININE 0.98 0.92 1.00 0.99  CALCIUM 9.1 9.3 8.8* 8.6*  MG 2.5* 2.3  --  2.2  PHOS  --  4.3  --   --    Liver Function Tests: Recent Labs  Lab 12/05/17 1936 12/06/17 0608  AST 32 38  ALT 29 36  ALKPHOS 100 100  BILITOT 0.8 0.9  PROT 6.4* 6.7  ALBUMIN 3.8 3.8   Recent Labs  Lab 12/05/17 1936  LIPASE 44   No results for input(s): AMMONIA in the last 168 hours. CBC: Recent Labs  Lab 12/05/17 1936 12/06/17 0608  WBC 11.7* 10.2  NEUTROABS 7.7  --   HGB 15.3 15.0  HCT 47.6 45.2  MCV 100.4* 100.2*  PLT 183 170   Cardiac Enzymes:   Recent Labs  Lab 12/05/17 2144 12/06/17 0436 12/06/17 0942  TROPONINI 0.08* 0.10* 0.09*   BNP (last 3 results) Recent Labs    12/05/17 1936 12/08/17 0415  BNP 1,182.7* 666.7*    ProBNP (last 3 results) No results for input(s): PROBNP in the last 8760 hours.  CBG: No results for input(s): GLUCAP in the last 168 hours.  Recent Results (from the past 240 hour(s))  Blood culture (routine x 2)     Status: None (Preliminary result)   Collection Time: 12/05/17  9:44 PM  Result Value Ref Range Status   Specimen Description   Final    BLOOD LEFT HAND Performed at [facility] [address], [place],  [place] [address]    Special Requests   Final    BOTTLES DRAWN AEROBIC AND ANAEROBIC Blood Culture results may not be optimal due to an inadequate volume of blood received in culture bottles Performed at [facility] [address], [place], [place] [address]    Culture   Final    NO GROWTH 3 DAYS Performed at [facility], [place] [address], [place], [place] [address]    Report Status PENDING  Incomplete  Blood culture (routine x 2)     Status: None (Preliminary result)   Collection Time: 12/05/17  9:44 PM  Result Value Ref Range Status   Specimen Description   Final    BLOOD RIGHT ANTECUBITAL Performed at [facility] [address] [place], [place]  [address]    Special Requests   Final    BOTTLES DRAWN AEROBIC AND ANAEROBIC Blood Culture adequate volume Performed at [facility] [address] [place], [place] [address]    Culture   Final    NO GROWTH 3 DAYS Performed at [facility], [place] [address] [place], [place] [address]    Report Status PENDING  Incomplete  Urine culture     Status: Abnormal   Collection Time: 12/05/17 10:39 PM  Result Value Ref Range Status   Specimen Description   Final    URINE, RANDOM Performed at [facility] [address], [place], [place] [address]    Special Requests   Final    NONE Performed at [facility], [place] [address], [place], [place] [address]    Culture (A)  Final    <10,000 COLONIES/mL INSIGNIFICANT GROWTH Performed at [facility] [address] [place], [place] [address]    Report Status 12/07/2017 FINAL  Final  Culture, body fluid-bottle     Status: None (Preliminary result)   Collection Time: 12/06/17 10:15 AM  Result Value Ref Range Status   Specimen Description PLEURAL RIGHT  Final   Special Requests NONE  Final   Culture   Final    NO GROWTH 2 DAYS Performed at [facility], [place] [address]  [place], [place] [address]    Report Status PENDING  Incomplete  Gram stain     Status: None   Collection Time: 12/06/17 10:15 AM  Result Value Ref Range Status   Specimen Description PLEURAL RIGHT  Final   Special Requests NONE  Final   Gram Stain   Final    FEW WBC PRESENT,BOTH PMN AND MONONUCLEAR NO ORGANISMS SEEN Performed at [facility], [address] [place], [place] [address]    Report Status 12/06/2017 FINAL  Final  MRSA PCR Screening     Status: None   Collection Time: 12/06/17  2:46 PM  Result Value Ref Range Status   MRSA by PCR NEGATIVE NEGATIVE Final    Comment:        The GeneXpert MRSA Assay (FDA approved for NASAL specimens only), is one component of a comprehensive MRSA colonization surveillance program. It is not intended to diagnose MRSA infection nor to guide or monitor treatment for MRSA infections. Performed at [facility], [place] [address] [place], [place] [address]      Studies: No results found.  Scheduled Meds: . aspirin EC  81 mg Oral Daily  . atorvastatin  20 mg Oral QPM  . fluticasone  1 spray Each Nare BID  . furosemide  20 mg Oral Daily  . losartan  25 mg Oral Daily  . sodium chloride flush  3 mL Intravenous Q12H    Continuous Infusions: . sodium chloride 10 mL/hr at 12/07/17 0800     Time spent: 54mins I have personally reviewed and interpreted on  12/08/2017 daily labs, tele strips, imagings as discussed above under date review session and assessment and plans.  I reviewed all nursing notes, pharmacy notes, consultant notes,  vitals, pertinent old records  I have discussed plan of care as described above with RN , patient and wife on 12/08/2017   Florencia Reasons MD, PhD  [facility] Pager [phone]. If 7PM-7AM, please contact night-coverage at [email or web address], password [facility] 12/08/2017, 7:51 PM  LOS: 3 days

## 2017-12-08 NOTE — Care Management Important Message (Signed)
Important Message  Patient Details  Name: SYLVAN SOOKDEO MRN: [id number] Date of Birth: 1927/04/12   Medicare Important Message Given:  Yes    Kerin Salen 12/08/2017, 12:03 [place] Message  Patient Details  Name: JYQUAN KENLEY MRN: [id number] Date of Birth: 1926/11/10   Medicare Important Message Given:  Yes    Kerin Salen 12/08/2017, 12:02 PM

## 2017-12-08 NOTE — Progress Notes (Signed)
Pt ambulating in hallway to assess oxygen saturations on room air. Pt on room air  with ambulation 86% and when placed on oxygen @ 2l [place] sat increased to 92 % however during return trip to Pt's room Oxygen Saturations decreased to 89 % and Pt with increased respiratory effort. O2 increased to 3l [place] with O2 saturations @ 92%.Marland Kitchen

## 2017-12-08 NOTE — Progress Notes (Signed)
Subjective:  No chest pain ambulating    Objective:  Vitals:   12/07/17 1200 12/07/17 1444 12/07/17 2200 12/08/17 0603  BP:  128/84 124/73 130/81  Pulse:  70 61 60  Resp:   18 19  Temp: 97.9 F (36.6 C) 97.8 F (36.6 C) 97.8 F (36.6 C) 98 F (36.7 C)  TempSrc: Axillary Oral Oral Oral  SpO2:  96% 95% 94%  Weight:    204 lb 5.9 oz (92.7 kg)  Height:        Intake/Output from previous day:  Intake/Output Summary (Last 24 hours) at 12/08/2017 1053 Last data filed at 12/08/2017 0900 Gross per 24 hour  Intake 960 ml  Output 301 ml  Net 659 ml    Physical Exam: Affect appropriate Healthy:  appears stated age HEENT: normal Neck supple with no adenopathy JVP normal no bruits no thyromegaly Lungs clear with no wheezing and good diaphragmatic motion Heart:  S1/S2 no murmur, no rub, gallop or click PMI normal Abdomen: benighn, BS positve, no tenderness, no AAA no bruit.  No HSM or HJR Distal pulses intact with no bruits No edema Neuro non-focal Skin warm and dry No muscular weakness   Lab Results: Basic Metabolic Panel: Recent Labs    12/06/17 0608 12/07/17 0315 12/08/17 0415  NA 136 133* 131*  K 4.6 4.1 3.9  CL 96* 93* 91*  CO2 30 33* 33*  GLUCOSE 117* 141* 143*  BUN 18 18 18   CREATININE 0.92 1.00 0.99  CALCIUM 9.3 8.8* 8.6*  MG 2.3  --  2.2  PHOS 4.3  --   --    Liver Function Tests: Recent Labs    12/05/17 1936 12/06/17 0608  AST 32 38  ALT 29 36  ALKPHOS 100 100  BILITOT 0.8 0.9  PROT 6.4* 6.7  ALBUMIN 3.8 3.8   Recent Labs    12/05/17 1936  LIPASE 44   CBC: Recent Labs    12/05/17 1936 12/06/17 0608  WBC 11.7* 10.2  NEUTROABS 7.7  --   HGB 15.3 15.0  HCT 47.6 45.2  MCV 100.4* 100.2*  PLT 183 170   Cardiac Enzymes: Recent Labs    12/05/17 2144 12/06/17 0436 12/06/17 0942  TROPONINI 0.08* 0.10* 0.09*   BNP: Invalid input(s): POCBNP D-Dimer: Recent Labs    12/05/17 1936  DDIMER 0.86*   Hemoglobin A1C: No  results for input(s): HGBA1C in the last 72 hours. Fasting Lipid Panel: No results for input(s): CHOL, HDL, LDLCALC, TRIG, CHOLHDL, LDLDIRECT in the last 72 hours. Thyroid Function Tests: Recent Labs    12/06/17 0608  TSH 2.090   Anemia Panel: Recent Labs    12/07/17 0317  VITAMINB12 402  FOLATE 15.3    Imaging: No results found.  Cardiac Studies:  ECG: SR PVC LAFB poor R wave progression    Telemetry:  NSR PVCls 12/08/2017   Echo: EF 35-40% mild MR  Medications:   . aspirin EC  81 mg Oral Daily  . atorvastatin  20 mg Oral QPM  . fluticasone  1 spray Each Nare BID  . furosemide  20 mg Oral Daily  . losartan  25 mg Oral Daily  . sodium chloride flush  3 mL Intravenous Q12H     . sodium chloride 10 mL/hr at 12/07/17 0800    Assessment/Plan:   Bradycardia: relative due to ectopy chronic no indication for pacer CHF: clinically euvolemic but I/O's positive start oral lasix 20 mg daily and ARB cozaar 25  mg No beta blocker given relative bradycardia PVC;s: chronic previously on propofenone d/c by Dr Caryl Comes asymptomatic observe Pleural Effusion: likely transudative post US guided thoracentesis no pneumothorax exam Improved as is CXR    Ok to d/c home from cardiology perspective Etiology of EF drop likely cardiomyopathic not ischemic And given age and lack of chest pain no indication for further w/u   Jenkins Rouge 12/08/2017, 10:53 AM

## 2017-12-08 NOTE — Progress Notes (Signed)
SATURATION QUALIFICATIONS: (This note is used to comply with regulatory documentation for home oxygen)  Patient Saturations on Room Air at Rest =88%  Patient Saturations on Room Air while Ambulating = 86%  Patient Saturations on 3 Liters of oxygen while Ambulating = 92%  Please briefly explain why patient needs home oxygen:pt with decreasing oxygen saturations with ambulation and Increased respiratory effort.

## 2017-12-09 DIAGNOSIS — J9621 Acute and chronic respiratory failure with hypoxia: Secondary | ICD-10-CM

## 2017-12-09 LAB — BASIC METABOLIC PANEL
ANION GAP: 6 (ref 5–15)
BUN: 16 mg/dL (ref 6–20)
CO2: 35 mmol/L — ABNORMAL HIGH (ref 22–32)
Calcium: 8.6 mg/dL — ABNORMAL LOW (ref 8.9–10.3)
Chloride: 88 mmol/L — ABNORMAL LOW (ref 101–111)
Creatinine, Ser: 0.79 mg/dL (ref 0.61–1.24)
GFR calc Af Amer: >60 mL/min (ref >60)
GLUCOSE: 99 mg/dL (ref 65–99)
POTASSIUM: 4.5 mmol/L (ref 3.5–5.1)
Sodium: 129 mmol/L — ABNORMAL LOW (ref 135–145)

## 2017-12-09 LAB — MAGNESIUM: Magnesium: 2.2 mg/dL (ref 1.7–2.4)

## 2017-12-09 MED ORDER — FUROSEMIDE 20 MG PO TABS: 20.0000 mg | ORAL_TABLET | ORAL | 0 refills | Status: DC

## 2017-12-09 MED ORDER — LOSARTAN POTASSIUM 25 MG PO TABS: 25.0000 mg | ORAL_TABLET | Freq: Every day | ORAL | 0 refills | Status: DC

## 2017-12-09 NOTE — Care Management Note (Signed)
Case Management Note  Patient Details  Name: [name] MRN: [id number] Date of Birth: 09/03/1927  Subjective/Objective:  [name] ordered. Qualified for home 02-[facility] chosen by son [name] rep [name] aware of d/c & HHC,home 02 order to deliver home 02 to rm prior d/c. Spouse plans to transp home on own.                 Action/Plan:d/c home w/HHC/dme.   Expected Discharge Date:  12/09/17               Expected Discharge Plan:  [name]  In-House Referral:     Discharge planning Services  CM Consult  Post Acute Care Choice:    Choice offered to:  Spouse  DME Arranged:  Oxygen DME Agency:  [place]:  PT, RN, Nurse's Aide, Social Work [employer] Agency:  [facility]  Status of Service:  Completed, signed off  If discussed at [employer], dates discussed:    Additional Comments:  Dessa Phi, RN 12/09/2017, 11:32 AM

## 2017-12-09 NOTE — Discharge Summary (Addendum)
Discharge Summary  LOUDON KRAKOW DJS:[id number] DOB: November 14, 1926  PCP: Christopher Hams, FNP  Admit date: 12/05/2017 Discharge date: 12/09/2017  Time spent: >71mins, more than 50% time spent on coordination of care.  Recommendations for Outpatient Follow-up:  1. F/u with PMD within a week  for hospital discharge follow up, repeat cbc/bmp at follow up. pmd to follow up on final pleural fluids cytology result 2. F/u with cardiology, cardiology to monitor lasix dose, he is started on cozaar , cardiology to monitor renal function.  3. Home health and home 02 arranged  Discharge Diagnoses:  Active Hospital Problems   Diagnosis Date Noted  . Pleural effusion on right 12/05/2017  . Acute respiratory failure with hypoxia ([facility]) 12/05/2017  . Memory difficulty 05/26/2017  . Atrial fibrillation ([facility]) 01/03/2017  . Bladder cancer ([facility] [place]) 07/01/2012  . Ventricular bigeminy   . Nonobstructive CAD (coronary artery disease)   . Hypertension   . Hyperlipidemia     Resolved Hospital Problems  No resolved problems to display.    Discharge Condition: stable  Diet recommendation: heart healthy  Filed Weights   12/07/17 0600 12/08/17 0603 12/09/17 0500  Weight: 92.7 kg (204 lb 5.9 oz) 92.7 kg (204 lb 5.9 oz) 93.1 kg (205 lb 4 oz)    History of present illness: (per admitting MD Dr Roel Cluck) HPI: [name] is a 84 y.o. male with medical history significant of CAD and paroxysmal atrial fibrillation, dementia history of bladder cancer, CKD, HLD Prostate cancer nonsustained ventricular tachycardia in 2012  Presented patient for the past 1 week have been feeling particularly short of breath and weak all over he presented to his primary care office today was noted to be hypoxic down to 88% and sent to emergency department of note at the doctor's office blood pressure was systolics in the 885O on arrival to emergency department initially satting 91% on room air Reports some associated cough  but no wheezing has been overall feeling nauseous and lightheaded No chest pain last week he endorsed near syncopal episode but not often this week have not had any palpitations.  patient does have history of mild dementia Last echogram in April 2018 showed preserved EF   While in ER: Noted to be hypoxic and started on 2 L of nasal cannula seem to have improved transiently was noted to be hypotensive in the emergency department this felt to be most likely erroneous secondary to bigeminy and inability of blood pressure cuff to read appropriately.  But given transient hypotension he was started on levo fed this was discontinued as blood pressure noted to be normal on manual reading and patient clinically appeared to be without any evidence of distress Noted to be in bigeminy in the emergency department heart rates in's 40s Initially critical care was called but after further discussion Levophed was discontinued and plan was to admit to medicine    Hospital Course:  Active Problems:   Ventricular bigeminy   Nonobstructive CAD (coronary artery disease)   Hypertension   Hyperlipidemia   Bladder cancer (HCC)   Atrial fibrillation (HCC)   Memory difficulty   Pleural effusion on right   Acute respiratory failure with hypoxia (HCC)   Acute hypoxic respiratory failure likely from acute on chronic systolic chf -he is sent from pmd office due to sob, hypoxia, 88% on room air -CTA negative for PE,  Mild cardiomegaly with atherosclerotic coronary artery disease.  -Troponin mildly elevated. bnp 1182. -he denies chest pain, he does has bilateral  lower extremity pitting edema, asymmetrical pleural effusion on right -s/p therapeutic and diagnostic US guided thoracentesis with 600ccclear orangefluid, fluids study consistent with transudates, gram stain negative , culture no growth, cytology pending. No fever, denies pain. -he received iv lasix, then on oral lasix 20mg  daily while in the hospital, he is  started on cozaar, not able to start coreg due to bradycardia -cardiology consulted , input appreciated, no further invasive study, recommend medical management. -patient is discharged on lasix 20mg  qmwf, cozaar 25mg  daily.  I have discussed with wife and recommend close follow up with patient 's cardiology Dr [name] for further meds adjustment.  Wife reports patient has a history of hyponatremia while taking lasix, patient is advised to repeat bmp in one week at cardiology office , further lasix dosing per cardiology.  Patient is discharged on home o2.   Bigeminy, bradycardia ,  NSVT, h/o PAF -stop aricept -keep K.4, mag >2,  tsh2 -he was not anticoagulation due to history of falls and dementia -not able to start coreg due to bradycardia -close follow up with cardiology  CAD: last cath in 2012 Currently denies chest pain, mild troponin elevation likely from chf exacerbation Continue asa, statin   Macrocytosis: b12/folate wnl, tsh wnl.  Mild compression fracture in the region of the thoracolumbar junction new since the previous exam, but age indeterminate Incidental finding on CTA chest Patient denies back pain  Dementia : pleasantly demented, not oriented to month D/c aricept due to bradycardia  H/o prostate cancer s/p brachytherapy H/o bladder cancer s/p resection  FTT: start PT eval /home health  Code Status: DNR, confirmed with wife over the phone  Family Communication: patient   Disposition Plan: d/c on 3/26 with home o2, home health   Consultants:  cardiology  Procedures:  US guided thoracentesis on right on 3/23  Antibiotics:  none   Discharge Exam: BP 140/77 (BP Location: Right Arm)   Pulse (!) 59   Temp 98 F (36.7 C) (Oral)   Resp 16   Ht 5\' 10"  (1.778 m)   Wt 93.1 kg (205 lb 4 oz)   SpO2 96%   BMI 29.45 kg/m   General: NAD, not oriented to month, pleasant Cardiovascular: RRR with ectopy Respiratory: CTABL Extremity:  pitting edema has resolved   Discharge Instructions You were cared for by a hospitalist during your hospital stay. If you have any questions about your discharge medications or the care you received while you were in the hospital after you are discharged, you can call the unit and asked to speak with the hospitalist on call if the hospitalist that took care of you is not available. Once you are discharged, your primary care physician will handle any further medical issues. Please note that NO REFILLS for any discharge medications will be authorized once you are discharged, as it is imperative that you return to your primary care physician (or establish a relationship with a primary care physician if you do not have one) for your aftercare needs so that they can reassess your need for medications and monitor your lab values.  Discharge Instructions    Diet - low sodium heart healthy   Complete by:  As directed    Increase activity slowly   Complete by:  As directed      Allergies as of 12/09/2017      Reactions   Neosporin [neomycin-bacitracin Zn-polymyx] Other (See Comments)   Makes his skin red   Penicillins Hives, Itching   Penicillin G  Rash      Medication List    STOP taking these medications   donepezil 10 MG tablet Commonly known as:  ARICEPT     TAKE these medications   aspirin EC 81 MG tablet Take 81 mg by mouth daily.   atorvastatin 20 MG tablet Commonly known as:  LIPITOR Take 20 mg by mouth every evening.   fluticasone 50 MCG/ACT nasal spray Commonly known as:  FLONASE Place 1 spray into the nose daily.   furosemide 20 MG tablet Commonly known as:  LASIX Take 1 tablet (20 mg total) by mouth every Monday, Wednesday, and Friday. Start taking on:  12/10/2017 What changed:  when to take this   losartan 25 MG tablet Commonly known as:  COZAAR Take 1 tablet (25 mg total) by mouth daily. Start taking on:  12/10/2017   prednisoLONE acetate 1 % ophthalmic  suspension Commonly known as:  PRED FORTE Place 2 drops into both eyes 2 (two) times daily as needed (redness).            Durable Medical Equipment  (From admission, onward)        Start     Ordered   12/08/17 2000  DME Oxygen  Once    Question Answer Comment  Mode or (Route) Nasal cannula   Liters per Minute 2   Frequency Continuous (stationary and portable oxygen unit needed)   Oxygen conserving device Yes   Oxygen delivery system Gas      12/08/17 2000     Allergies  Allergen Reactions  . Neosporin [Neomycin-Bacitracin Zn-Polymyx] Other (See Comments)    Makes his skin red   . Penicillins Hives and Itching  . Penicillin G Rash   Follow-up Information    Christopher Hams, FNP Follow up.   Specialty:  Family Medicine Why:  pmd to repeat cxr in 3-4 weeks Contact information: [place] [address] [phone]        Ellis Parents, MD Follow up.   Specialty:  Internal Medicine Why:  repeat bmp at follow up, cardiology to continue adjust lasix dose and monitor sodium level for heart failure ( reduced EF at 35-40%)  Contact information: [place] [address] [phone]            The results of significant diagnostics from this hospitalization (including imaging, microbiology, ancillary and laboratory) are listed below for reference.    Significant Diagnostic Studies: Dg Chest 2 View  Result Date: 12/05/2017 CLINICAL DATA:  Shortness of breath EXAM: CHEST - 2 VIEW COMPARISON:  Chest radiograph 01/03/2017 FINDINGS: Moderate cardiomegaly with medium-sized right pleural effusion. Central pulmonary vascular congestion. Right basilar atelectasis. No pneumothorax. IMPRESSION: Moderate cardiomegaly and medium-sized right pleural effusion. Central pulmonary vascular congestion without overt pulmonary edema. Electronically Signed   By: Ulyses Jarred M.D.   On: 12/05/2017 19:57   Ct Angio Chest Pe W And/or  Wo Contrast  Result Date: 12/05/2017 CLINICAL DATA:  Shortness of breath 3 days.  Mild hypoxia. EXAM: CT ANGIOGRAPHY CHEST WITH CONTRAST TECHNIQUE: Multidetector CT imaging of the chest was performed using the standard protocol during bolus administration of intravenous contrast. Multiplanar CT image reconstructions and MIPs were obtained to evaluate the vascular anatomy. CONTRAST:  98 mL ISOVUE-370 IOPAMIDOL (ISOVUE-370) INJECTION 76% COMPARISON:  CT abdomen 06/24/2014 FINDINGS: Cardiovascular: Mild cardiomegaly. Calcified plaque over the left main, left anterior descending and right coronary arteries. Calcified plaque over the thoracic aorta. No evidence of pulmonary  embolism. Mediastinum/Nodes: 1 cm right paratracheal lymph node. No mediastinal adenopathy. Remaining mediastinal structures are within normal. Lungs/Pleura: Lungs are adequately inflated demonstrate a moderate size right pleural effusion with mild associated atelectasis in the right base. Calcified granuloma over the right base. Left lung is clear. Airways are normal. Upper Abdomen: No acute abnormality. Musculoskeletal: Mild degenerate change of the spine. There is a mild compression fracture in the region of the thoracolumbar junction new since the previous exam, but age indeterminate. Review of the MIP images confirms the above findings. IMPRESSION: No evidence of pulmonary embolism. Moderate size right pleural effusion with right basilar atelectasis. Mild cardiomegaly with atherosclerotic coronary artery disease. Aortic Atherosclerosis (ICD10-I70.0). Mild compression fracture in the region of the thoracolumbar junction new since the previous exam, but age indeterminate. Electronically Signed   By: Marin Olp M.D.   On: 12/05/2017 20:59   Dg Chest Portable 1 View  Result Date: 12/06/2017 CLINICAL DATA:  Cough and shortness of breath for the past 3 days. EXAM: PORTABLE CHEST 1 VIEW COMPARISON:  Chest x-ray from yesterday. FINDINGS: Stable  cardiomegaly. Improved central pulmonary vascular congestion. Interval decrease in size of now small right pleural effusion status post thoracentesis. No pneumothorax. The left lung is clear. No acute osseous abnormality. IMPRESSION: 1. Interval decrease in size of now small right pleural effusion status post thoracentesis. No pneumothorax. 2. Improved central pulmonary vascular congestion. Electronically Signed   By: Titus Dubin M.D.   On: 12/06/2017 10:54   US Thoracentesis Asp Pleural Space W/img Guide  Result Date: 12/06/2017 INDICATION: Shortness of breath. History of bladder and prostate cancer. Right pleural effusion. Request for diagnostic and therapeutic thoracentesis. EXAM: ULTRASOUND GUIDED RIGHT THORACENTESIS MEDICATIONS: 1% Lidocaine = 10 mL COMPLICATIONS: None immediate. PROCEDURE: An ultrasound guided thoracentesis was thoroughly discussed with the patient and questions answered. The benefits, risks, alternatives and complications were also discussed. The patient understands and wishes to proceed with the procedure. Written consent was obtained. Ultrasound was performed to localize and mark an adequate pocket of fluid in the right chest. The area was then prepped and draped in the normal sterile fashion. 1% Lidocaine was used for local anesthesia. Under ultrasound guidance a 6 Fr Safe-T-Centesis catheter was introduced. Thoracentesis was performed. The catheter was removed and a dressing applied. FINDINGS: A total of approximately 600 mL of clear orange fluid was removed. Samples were sent to the laboratory as requested by the clinical team. IMPRESSION: Successful ultrasound guided right thoracentesis yielding 600 mL of pleural fluid. No pneumothorax on post procedure chest X-ray. Read by: Gareth Eagle, PA-C Electronically Signed   By: Markus Daft M.D.   On: 12/06/2017 11:27    Microbiology: Recent Results (from the past 240 hour(s))  Blood culture (routine x 2)     Status: None  (Preliminary result)   Collection Time: 12/05/17  9:44 PM  Result Value Ref Range Status   Specimen Description   Final    BLOOD LEFT HAND Performed at [facility], [place] [address], [place], [place] [address]    Special Requests   Final    BOTTLES DRAWN AEROBIC AND ANAEROBIC Blood Culture results may not be optimal due to an inadequate volume of blood received in culture bottles Performed at [facility] [address] [place], [place] [address]    Culture   Final    NO GROWTH 4 DAYS Performed at [facility], [place] [address] [place], [place] [address]    Report  Status PENDING  Incomplete  Blood culture (routine x 2)     Status: None (Preliminary result)   Collection Time: 12/05/17  9:44 PM  Result Value Ref Range Status   Specimen Description   Final    BLOOD RIGHT ANTECUBITAL Performed at [facility] [address], [place], [place] [address]    Special Requests   Final    BOTTLES DRAWN AEROBIC AND ANAEROBIC Blood Culture adequate volume Performed at [facility] [address], [place],  [place] [address]    Culture   Final    NO GROWTH 4 DAYS Performed at [facility], [place] [address] [place], [place] [address]    Report Status PENDING  Incomplete  Urine culture     Status: Abnormal   Collection Time: 12/05/17 10:39 PM  Result Value Ref Range Status   Specimen Description   Final    URINE, RANDOM Performed at [facility] [address], [place], [place] [address]    Special Requests   Final    NONE Performed at [facility], [place] [address] [place], [place] [address]    Culture (A)  Final    <10,000 COLONIES/mL INSIGNIFICANT GROWTH Performed at [facility] [address], [place], [place] [address]    Report Status 12/07/2017 FINAL  Final  Culture, body fluid-bottle     Status: None (Preliminary result)   Collection Time:  12/06/17 10:15 AM  Result Value Ref Range Status   Specimen Description PLEURAL RIGHT  Final   Special Requests NONE  Final   Culture   Final    NO GROWTH 3 DAYS Performed at [facility] [address] [place], [place] [address]    Report Status PENDING  Incomplete  Gram stain     Status: None   Collection Time: 12/06/17 10:15 AM  Result Value Ref Range Status   Specimen Description PLEURAL RIGHT  Final   Special Requests NONE  Final   Gram Stain   Final    FEW WBC PRESENT,BOTH PMN AND MONONUCLEAR NO ORGANISMS SEEN Performed at [facility], [address], [place], [place] [address]    Report Status 12/06/2017 FINAL  Final  MRSA PCR Screening     Status: None   Collection Time: 12/06/17  2:46 PM  Result Value Ref Range Status   MRSA by PCR NEGATIVE NEGATIVE Final    Comment:        The GeneXpert MRSA Assay (FDA approved for NASAL specimens only), is one component of a comprehensive MRSA colonization surveillance program. It is not intended to diagnose MRSA infection nor to guide or monitor treatment for MRSA infections. Performed at [facility], [place] [address] [place], [place] [address]      Labs: Basic Metabolic Panel: Recent Labs  Lab 12/05/17 1936 12/06/17 0608 12/07/17 0315 12/08/17 0415 12/09/17 0604  NA 136 136 133* 131* 129*  K 4.7 4.6 4.1 3.9 4.5  CL 97* 96* 93* 91* 88*  CO2 30 30 33* 33* 35*  GLUCOSE 109* 117* 141* 143* 99  BUN 20 18 18 18 16   CREATININE 0.98 0.92 1.00 0.99 0.79  CALCIUM 9.1 9.3 8.8* 8.6* 8.6*  MG 2.5* 2.3  --  2.2 2.2  PHOS  --  4.3  --   --   --    Liver Function Tests: Recent Labs  Lab 12/05/17 1936 12/06/17 0608  AST 32 38  ALT 29 36  ALKPHOS 100 100  BILITOT 0.8 0.9  PROT 6.4* 6.7  ALBUMIN 3.8 3.8   Recent Labs  Lab 12/05/17 1936  LIPASE 44   No results for input(s): AMMONIA in the last 168 hours. CBC: Recent Labs  Lab 12/05/17 1936 12/06/17 0608  WBC 11.7* 10.2    NEUTROABS 7.7  --   HGB 15.3 15.0  HCT 47.6 45.2  MCV 100.4* 100.2*  PLT 183 170   Cardiac Enzymes: Recent Labs  Lab 12/05/17 2144 12/06/17 0436 12/06/17 0942  TROPONINI 0.08* 0.10* 0.09*   BNP: BNP (last 3 results) Recent Labs    12/05/17 1936 12/08/17 0415  BNP 1,182.7* 666.7*    ProBNP (last 3 results) No results for input(s): PROBNP in the last 8760 hours.  CBG: No results for input(s): GLUCAP in the last 168 hours.     Signed:  Florencia Reasons MD, PhD  [facility] 12/09/2017, 10:09 AM

## 2017-12-09 NOTE — Progress Notes (Signed)
Went over discharge papers with patient and family.  AVS given to patient.  O2 delivered to room.  HH needs set up by CM.  PT wheeled out via NT.

## 2017-12-10 LAB — CULTURE, BLOOD (ROUTINE X 2)
CULTURE: NO GROWTH
Culture: NO GROWTH
Special Requests: ADEQUATE

## 2017-12-11 LAB — CULTURE, BODY FLUID W GRAM STAIN -BOTTLE: Culture: NO GROWTH

## 2017-12-11 LAB — CULTURE, BODY FLUID-BOTTLE

## 2017-12-22 ENCOUNTER — Emergency Department (HOSPITAL_COMMUNITY)
Admission: EM | Admit: 2017-12-22 | Discharge: 2017-12-23 | Disposition: A | Discharge status: Home / Self Care | Payer: Medicare HMO | Attending: Emergency Medicine | Admitting: Emergency Medicine

## 2017-12-22 ENCOUNTER — Encounter (HOSPITAL_COMMUNITY): Payer: Self-pay | Admitting: Emergency Medicine

## 2017-12-22 DIAGNOSIS — Y929 Unspecified place or not applicable: Secondary | ICD-10-CM | POA: Diagnosis not present

## 2017-12-22 DIAGNOSIS — Z8546 Personal history of malignant neoplasm of prostate: Secondary | ICD-10-CM | POA: Diagnosis not present

## 2017-12-22 DIAGNOSIS — Y999 Unspecified external cause status: Secondary | ICD-10-CM | POA: Diagnosis not present

## 2017-12-22 DIAGNOSIS — Z79899 Other long term (current) drug therapy: Secondary | ICD-10-CM | POA: Insufficient documentation

## 2017-12-22 DIAGNOSIS — Y9301 Activity, walking, marching and hiking: Secondary | ICD-10-CM | POA: Diagnosis not present

## 2017-12-22 DIAGNOSIS — W0110XA Fall on same level from slipping, tripping and stumbling with subsequent striking against unspecified object, initial encounter: Secondary | ICD-10-CM | POA: Insufficient documentation

## 2017-12-22 DIAGNOSIS — Z8551 Personal history of malignant neoplasm of bladder: Secondary | ICD-10-CM | POA: Insufficient documentation

## 2017-12-22 DIAGNOSIS — I129 Hypertensive chronic kidney disease with stage 1 through stage 4 chronic kidney disease, or unspecified chronic kidney disease: Secondary | ICD-10-CM | POA: Diagnosis not present

## 2017-12-22 DIAGNOSIS — W19XXXA Unspecified fall, initial encounter: Secondary | ICD-10-CM

## 2017-12-22 DIAGNOSIS — N189 Chronic kidney disease, unspecified: Secondary | ICD-10-CM | POA: Diagnosis not present

## 2017-12-22 DIAGNOSIS — Z7982 Long term (current) use of aspirin: Secondary | ICD-10-CM | POA: Diagnosis not present

## 2017-12-22 DIAGNOSIS — S0990XA Unspecified injury of head, initial encounter: Secondary | ICD-10-CM | POA: Diagnosis present

## 2017-12-22 DIAGNOSIS — I2510 Atherosclerotic heart disease of native coronary artery without angina pectoris: Secondary | ICD-10-CM | POA: Insufficient documentation

## 2017-12-22 DIAGNOSIS — S0083XA Contusion of other part of head, initial encounter: Secondary | ICD-10-CM | POA: Insufficient documentation

## 2017-12-22 DIAGNOSIS — F17220 Nicotine dependence, chewing tobacco, uncomplicated: Secondary | ICD-10-CM | POA: Diagnosis not present

## 2017-12-22 NOTE — ED Triage Notes (Signed)
BIB EMS from home after a mechanical fall, pt was leaning forward to pick something up when he fell and hit his forehead on the floor. Hematoma noted. No thinners. Pt is A/OX4, VSS.

## 2017-12-22 NOTE — ED Provider Notes (Signed)
[facility] EMERGENCY DEPARTMENT Provider Note   CSN: [id number] Arrival date & time:        History   Chief Complaint Chief Complaint  Patient presents with  . Fall    HPI [name] is a 84 y.o. male.  HPI  This is an 84 year old male with history of coronary artery disease, hypertension, hyperlipidemia, paroxysmal atrial fibrillation who presents following a fall.  Patient reports that he was leaning down to pick something up when his foot slipped and he fell onto his head.  He is reporting no pain.  He does not take any blood thinners.  He is only on a baby aspirin.  He denies any chest pain, shortness of breath, dizziness.  Denies weakness, numbness, tingling of the extremities.  He is alert and oriented x3.  Past Medical History:  Diagnosis Date  . Arthritis   . Bladder cancer ([place])   . CAD (coronary artery disease)    cath 02/28/11: dLM 10%, pLAD 20%, pRCA calcified nodule 50% (FFR 0.86 - not significant), mRCA 40-50%, mildly elevated filling pressures, EF 50-55%  . Cataract   . Chronic kidney disease   . Gait abnormality 05/26/2017  . Hard of hearing   . Hyperlipidemia   . Hypertension    LOV with EKG 06/11/11 Dr Caryl Comes  [facility]  . Memory difficulty 05/26/2017  . Prostate cancer ([place])    status post brachytherapy---in 2003  . Sleep apnea    STOP BANG SCORE 4  . Ventricular tachycardia, nonsustained/bigeminy    echo 6/12: EF 55%, basal inferolat AK, mild LVH    Patient Active Problem List   Diagnosis Date Noted  . Pleural effusion on right 12/05/2017  . Acute respiratory failure with hypoxia ([place]) 12/05/2017  . Memory difficulty 05/26/2017  . Gait abnormality 05/26/2017  . Constipation 01/03/2017  . Atrial fibrillation ([place]) 01/03/2017  . Hyponatremia 01/03/2017  . First degree AV block 06/28/2013  . Bladder cancer ([place]) 07/01/2012  . Cataracts, bilateral 03/14/2011  . Ventricular bigeminy   . Nonsustained ventricular tachycardia ([place])    . Nonobstructive CAD (coronary artery disease)   . Hypertension   . Hyperlipidemia     Past Surgical History:  Procedure Laterality Date  . APPENDECTOMY    . CARDIAC CATHETERIZATION  6/12  . CYSTOSCOPY  05/20/2012   Procedure: CYSTOSCOPY;  Surgeon: Alexis Frock, MD;  Location: [facility];  Service: Urology;  Laterality: N/A;  . CYSTOSCOPY  07/01/2012   Procedure: CYSTOSCOPY;  Surgeon: Molli Hazard, MD;  Location: [facility];  Service: Urology;  Laterality: N/A;  . EYE SURGERY     left cataract extraction with IOL  . SKIN CANCER EXCISION     x 4  . TRANSURETHRAL RESECTION OF BLADDER TUMOR  05/20/2012   Procedure: TRANSURETHRAL RESECTION OF BLADDER TUMOR (TURBT);  Surgeon: Alexis Frock, MD;  Location: [facility];  Service: Urology;  Laterality: N/A;  [place]   . TRANSURETHRAL RESECTION OF BLADDER TUMOR  07/01/2012   Procedure: TRANSURETHRAL RESECTION OF BLADDER TUMOR (TURBT);  Surgeon: Molli Hazard, MD;  Location: [facility];  Service: Urology;  Laterality: N/A;  Cystoscopy, TURBT, be prepared for Open Cystorraphy            Home Medications    Prior to Admission medications   Medication Sig Start Date End Date Taking? Authorizing Provider  aspirin EC 81 MG tablet Take 81 mg by mouth daily.     [provider]  atorvastatin (LIPITOR) 20  MG tablet Take 20 mg by mouth every evening.     [provider]  fluticasone (FLONASE) 50 MCG/ACT nasal spray Place 1 spray into the nose daily.  11/14/17 11/14/18  [provider]  furosemide (LASIX) 20 MG tablet Take 1 tablet (20 mg total) by mouth every Monday, Wednesday, and Friday. 12/10/17   Florencia Reasons, MD  losartan (COZAAR) 25 MG tablet Take 1 tablet (25 mg total) by mouth daily. 12/10/17   Florencia Reasons, MD  prednisoLONE acetate (PRED FORTE) 1 % ophthalmic suspension Place 2 drops into both eyes 2 (two) times daily as needed (redness).    [provider]    Family History Family History  Problem Relation Age  of Onset  . Arthritis Son   . Heart Problems Son        Aortic valve replacement  . Lung cancer Daughter   . Heart attack Neg Hx   . Stroke Neg Hx   . Hypertension Neg Hx     Social History Social History   Tobacco Use  . Smoking status: Former Smoker    Types: Cigars    Last attempt to quit: 05/20/1995    Years since quitting: 22.6  . Smokeless tobacco: Current User    Types: Chew  Substance Use Topics  . Alcohol use: Yes    Comment: 6 beer week  . Drug use: No     Allergies   Neosporin [neomycin-bacitracin zn-polymyx]; Penicillins; and Penicillin g   Review of Systems Review of Systems  Constitutional: Negative for fever.  Respiratory: Negative for shortness of breath.   Cardiovascular: Negative for chest pain.  Gastrointestinal: Negative for abdominal pain.  Musculoskeletal: Negative for back pain and neck pain.  Skin: Positive for wound.  Neurological: Negative for dizziness, syncope, weakness and numbness.  All other systems reviewed and are negative.    Physical Exam Updated Vital Signs BP 101/64 (BP Location: Right Arm)   Pulse 61   Temp (!) 97.5 F (36.4 C)   Resp 19   SpO2 100%   Physical Exam  Constitutional: He is oriented to person, place, and time.  Elderly, no acute distress  HENT:  Abrasion/hematoma over the right forehead, no active bleeding, midface stable  Eyes: Pupils are equal, round, and reactive to light.  Pupils 3 mm reactive bilaterally  Neck: Normal range of motion. Neck supple.  No midline C-spine tenderness to palpation, step-off, or deformity  Cardiovascular: Normal rate and normal heart sounds.  No murmur heard. Irregular rhythm  Pulmonary/Chest: Effort normal and breath sounds normal. No respiratory distress. He has no wheezes.  Abdominal: Soft. Bowel sounds are normal. There is no tenderness. There is no rebound.  Musculoskeletal: He exhibits no edema.  Lymphadenopathy:    He has no cervical adenopathy.  Neurological:  He is alert and oriented to person, place, and time.  Cranial nerves II through XII intact, 5 out of 5 strength in all 4 extremities  Skin: Skin is warm and dry.  Psychiatric: He has a normal mood and affect.  Nursing note and vitals reviewed.    ED Treatments / Results  Labs (all labs ordered are listed, but only abnormal results are displayed) Labs Reviewed - No data to display  EKG EKG Interpretation  Date/Time:  Monday December 22 2017 23:17:39 EDT Ventricular Rate:  88 PR Interval:    QRS Duration: 104 QT Interval:  390 QTC Calculation: 471 R Axis:   -48 Text Interpretation:  Atrial fibrillation with premature ventricular  or aberrantly conducted complexes Left axis deviation Septal infarct , age undetermined Abnormal ECG Now in atrial fibrillation when compared to prior Confirmed by Thayer Jew [phone]) on 12/22/2017 11:41:00 PM   Radiology Ct Head Wo Contrast  Result Date: 12/23/2017 CLINICAL DATA:  Fall striking head on floor.  Forehead hematoma. EXAM: CT HEAD WITHOUT CONTRAST TECHNIQUE: Contiguous axial images were obtained from the base of the skull through the vertex without intravenous contrast. COMPARISON:  Head CT 01/03/2017 FINDINGS: Brain: Stable degree of atrophy and chronic small vessel ischemia. No intracranial hemorrhage, mass effect, or midline shift. No hydrocephalus. The basilar cisterns are patent. No evidence of territorial infarct or acute ischemia. No extra-axial or intracranial fluid collection. Vascular: Atherosclerosis of skullbase vasculature without hyperdense vessel or abnormal calcification. Skull: No fracture or focal lesion. Sinuses/Orbits: Chronic opacification of ethmoid air cells. No acute findings. Other: Frontal scalp hematoma. IMPRESSION: Frontal scalp hematoma without acute intracranial abnormality or skull fracture. Electronically Signed   By: Jeb Levering M.D.   On: 12/23/2017 01:49    Procedures Procedures (including critical care  time)  Medications Ordered in ED Medications - No data to display   Initial Impression / Assessment and Plan / ED Course  I have reviewed the triage vital signs and the nursing notes.  Pertinent labs & imaging results that were available during my care of the patient were reviewed by me and considered in my medical decision making (see chart for details).     Presents after reported mechanical fall.  He is overall nontoxic appearing and vital signs are reassuring.  Only obvious injury is a small hematoma and abrasion to the right forehead.  Is on aspirin daily.  CT scan of the head is negative.  C-spine cleared by Nexus criteria.  He is in atrial fibrillation.  History notable for paroxysmal atrial fibrillation.  Only on aspirin secondary to low chads score (mostly age) and risk for bleed secondary to fall per chart review.  This patients CHA2DS2-VASc Score and unadjusted Ischemic Stroke Rate (% per year) is equal to 3.2 % stroke rate/year from a score of 3  Above score calculated as 1 point each if present [CHF, HTN, DM, Vascular=MI/PAD/Aortic Plaque, Age if 65-74, or Male] Above score calculated as 2 points each if present [Age > 75, or Stroke/TIA/TE]    Final Clinical Impressions(s) / ED Diagnoses   Final diagnoses:  Fall, initial encounter  Contusion of face, initial encounter    ED Discharge Orders    None       Horton, Barbette Hair, MD 12/23/17 0200

## 2017-12-23 ENCOUNTER — Emergency Department (HOSPITAL_COMMUNITY): Payer: Medicare HMO

## 2017-12-23 NOTE — ED Notes (Signed)
Pt taken to CT.

## 2017-12-23 NOTE — Discharge Instructions (Addendum)
You were seen today for a fall.  Your CT scan is negative.  Follow-up with your primary physician.

## 2018-02-24 ENCOUNTER — Inpatient Hospital Stay (HOSPITAL_COMMUNITY)
Admission: EM | Admit: 2018-02-24 | Discharge: 2018-02-28 | DRG: 193 | Disposition: A | Discharge status: Skilled Nursing Facility | Payer: Medicare HMO | Attending: Family Medicine | Admitting: Family Medicine

## 2018-02-24 ENCOUNTER — Encounter (HOSPITAL_COMMUNITY): Payer: Self-pay | Admitting: Family Medicine

## 2018-02-24 ENCOUNTER — Emergency Department (HOSPITAL_COMMUNITY): Payer: Medicare HMO

## 2018-02-24 DIAGNOSIS — Z79899 Other long term (current) drug therapy: Secondary | ICD-10-CM | POA: Diagnosis not present

## 2018-02-24 DIAGNOSIS — N189 Chronic kidney disease, unspecified: Secondary | ICD-10-CM | POA: Diagnosis present

## 2018-02-24 DIAGNOSIS — Z7982 Long term (current) use of aspirin: Secondary | ICD-10-CM

## 2018-02-24 DIAGNOSIS — Z8546 Personal history of malignant neoplasm of prostate: Secondary | ICD-10-CM

## 2018-02-24 DIAGNOSIS — Z7951 Long term (current) use of inhaled steroids: Secondary | ICD-10-CM | POA: Diagnosis not present

## 2018-02-24 DIAGNOSIS — I130 Hypertensive heart and chronic kidney disease with heart failure and stage 1 through stage 4 chronic kidney disease, or unspecified chronic kidney disease: Secondary | ICD-10-CM | POA: Diagnosis present

## 2018-02-24 DIAGNOSIS — F17220 Nicotine dependence, chewing tobacco, uncomplicated: Secondary | ICD-10-CM | POA: Diagnosis present

## 2018-02-24 DIAGNOSIS — G4730 Sleep apnea, unspecified: Secondary | ICD-10-CM | POA: Diagnosis present

## 2018-02-24 DIAGNOSIS — Z9981 Dependence on supplemental oxygen: Secondary | ICD-10-CM

## 2018-02-24 DIAGNOSIS — R531 Weakness: Secondary | ICD-10-CM | POA: Diagnosis not present

## 2018-02-24 DIAGNOSIS — E860 Dehydration: Secondary | ICD-10-CM | POA: Diagnosis present

## 2018-02-24 DIAGNOSIS — Z880 Allergy status to penicillin: Secondary | ICD-10-CM

## 2018-02-24 DIAGNOSIS — J181 Lobar pneumonia, unspecified organism: Principal | ICD-10-CM | POA: Diagnosis present

## 2018-02-24 DIAGNOSIS — I959 Hypotension, unspecified: Secondary | ICD-10-CM | POA: Diagnosis present

## 2018-02-24 DIAGNOSIS — N390 Urinary tract infection, site not specified: Secondary | ICD-10-CM | POA: Diagnosis present

## 2018-02-24 DIAGNOSIS — R413 Other amnesia: Secondary | ICD-10-CM | POA: Diagnosis present

## 2018-02-24 DIAGNOSIS — E785 Hyperlipidemia, unspecified: Secondary | ICD-10-CM | POA: Diagnosis present

## 2018-02-24 DIAGNOSIS — I2510 Atherosclerotic heart disease of native coronary artery without angina pectoris: Secondary | ICD-10-CM | POA: Diagnosis present

## 2018-02-24 DIAGNOSIS — G9341 Metabolic encephalopathy: Secondary | ICD-10-CM | POA: Diagnosis not present

## 2018-02-24 DIAGNOSIS — J189 Pneumonia, unspecified organism: Secondary | ICD-10-CM | POA: Diagnosis not present

## 2018-02-24 DIAGNOSIS — N179 Acute kidney failure, unspecified: Secondary | ICD-10-CM | POA: Diagnosis present

## 2018-02-24 DIAGNOSIS — I4891 Unspecified atrial fibrillation: Secondary | ICD-10-CM | POA: Diagnosis present

## 2018-02-24 DIAGNOSIS — I5022 Chronic systolic (congestive) heart failure: Secondary | ICD-10-CM | POA: Diagnosis present

## 2018-02-24 DIAGNOSIS — H9190 Unspecified hearing loss, unspecified ear: Secondary | ICD-10-CM | POA: Diagnosis present

## 2018-02-24 DIAGNOSIS — R7989 Other specified abnormal findings of blood chemistry: Secondary | ICD-10-CM

## 2018-02-24 DIAGNOSIS — I10 Essential (primary) hypertension: Secondary | ICD-10-CM | POA: Diagnosis present

## 2018-02-24 DIAGNOSIS — Z8551 Personal history of malignant neoplasm of bladder: Secondary | ICD-10-CM | POA: Diagnosis not present

## 2018-02-24 DIAGNOSIS — R778 Other specified abnormalities of plasma proteins: Secondary | ICD-10-CM

## 2018-02-24 DIAGNOSIS — R627 Adult failure to thrive: Secondary | ICD-10-CM | POA: Diagnosis present

## 2018-02-24 DIAGNOSIS — B9620 Unspecified Escherichia coli [E. coli] as the cause of diseases classified elsewhere: Secondary | ICD-10-CM | POA: Diagnosis not present

## 2018-02-24 LAB — URINALYSIS, ROUTINE W REFLEX MICROSCOPIC
Bilirubin Urine: NEGATIVE
GLUCOSE, UA: NEGATIVE mg/dL
Ketones, ur: NEGATIVE mg/dL
NITRITE: NEGATIVE
Specific Gravity, Urine: 1.012 (ref 1.005–1.030)
WBC, UA: >50 WBC/hpf — ABNORMAL HIGH (ref 0–5)
pH: 7 (ref 5.0–8.0)

## 2018-02-24 LAB — COMPREHENSIVE METABOLIC PANEL
ALBUMIN: 2.4 g/dL — AB (ref 3.5–5.0)
ALT: 86 U/L — ABNORMAL HIGH (ref 17–63)
ANION GAP: 11 (ref 5–15)
AST: 69 U/L — AB (ref 15–41)
Alkaline Phosphatase: 101 U/L (ref 38–126)
BUN: 18 mg/dL (ref 6–20)
CHLORIDE: 87 mmol/L — AB (ref 101–111)
CO2: 33 mmol/L — AB (ref 22–32)
Calcium: 8.4 mg/dL — ABNORMAL LOW (ref 8.9–10.3)
Creatinine, Ser: 1.29 mg/dL — ABNORMAL HIGH (ref 0.61–1.24)
GFR calc Af Amer: 55 mL/min — ABNORMAL LOW (ref >60)
GFR calc non Af Amer: 48 mL/min — ABNORMAL LOW (ref >60)
GLUCOSE: 126 mg/dL — AB (ref 65–99)
POTASSIUM: 3.6 mmol/L (ref 3.5–5.1)
SODIUM: 131 mmol/L — AB (ref 135–145)
TOTAL PROTEIN: 5.9 g/dL — AB (ref 6.5–8.1)
Total Bilirubin: 1 mg/dL (ref 0.3–1.2)

## 2018-02-24 LAB — CBC WITH DIFFERENTIAL/PLATELET
BASOS ABS: 0 10*3/uL (ref 0.0–0.1)
BASOS PCT: 0 %
EOS ABS: 0.1 10*3/uL (ref 0.0–0.7)
Eosinophils Relative: 0 %
HEMATOCRIT: 39.6 % (ref 39.0–52.0)
Hemoglobin: 13.2 g/dL (ref 13.0–17.0)
Lymphocytes Relative: 10 %
Lymphs Abs: 1.7 10*3/uL (ref 0.7–4.0)
MCH: 32.3 pg (ref 26.0–34.0)
MCHC: 33.3 g/dL (ref 30.0–36.0)
MCV: 96.8 fL (ref 78.0–100.0)
MONO ABS: 1.7 10*3/uL — AB (ref 0.1–1.0)
MONOS PCT: 11 %
NEUTROS ABS: 12.4 10*3/uL — AB (ref 1.7–7.7)
Neutrophils Relative %: 79 %
PLATELETS: 312 10*3/uL (ref 150–400)
RBC: 4.09 MIL/uL — ABNORMAL LOW (ref 4.22–5.81)
RDW: 13.9 % (ref 11.5–15.5)
WBC: 15.9 10*3/uL — ABNORMAL HIGH (ref 4.0–10.5)

## 2018-02-24 LAB — MAGNESIUM: MAGNESIUM: 1.9 mg/dL (ref 1.7–2.4)

## 2018-02-24 LAB — I-STAT CG4 LACTIC ACID, ED: Lactic Acid, Venous: 1.37 mmol/L (ref 0.5–1.9)

## 2018-02-24 LAB — TROPONIN I: Troponin I: 0.12 ng/mL (ref <0.03)

## 2018-02-24 MED ORDER — SODIUM CHLORIDE 0.9 % IV SOLN
1.0000 g | Freq: Every day | INTRAVENOUS | Status: DC
  Administered 2018-02-24 – 2018-02-25 (×2): 1 g via INTRAVENOUS
  Filled 2018-02-24 (×2): qty 1

## 2018-02-24 MED ORDER — LEVOFLOXACIN IN D5W 500 MG/100ML IV SOLN
500.0000 mg | Freq: Once | INTRAVENOUS | Status: AC
  Administered 2018-02-24: 500 mg via INTRAVENOUS
  Filled 2018-02-24: qty 100

## 2018-02-24 MED ORDER — VANCOMYCIN HCL 10 G IV SOLR
1250.0000 mg | INTRAVENOUS | Status: DC
  Administered 2018-02-25 – 2018-02-26 (×2): 1250 mg via INTRAVENOUS
  Filled 2018-02-24 (×2): qty 1250

## 2018-02-24 MED ORDER — SODIUM CHLORIDE 0.9 % IV BOLUS
500.0000 mL | Freq: Once | INTRAVENOUS | Status: AC
  Administered 2018-02-24: 500 mL via INTRAVENOUS

## 2018-02-24 MED ORDER — VANCOMYCIN HCL IN DEXTROSE 1-5 GM/200ML-% IV SOLN
1000.0000 mg | Freq: Once | INTRAVENOUS | Status: AC
  Administered 2018-02-24: 1000 mg via INTRAVENOUS
  Filled 2018-02-24: qty 200

## 2018-02-24 NOTE — Progress Notes (Signed)
Pharmacy Antibiotic Note  [name] is a 84 y.o. male admitted on 02/24/2018 with pneumonia.  Pharmacy has been consulted for cefepime and vancomycin dosing.  MD aware PCN allergy = rash, hives wants to proceed.   Plan: Cefepime 1 Gm IV q24h  Vancomycin 1 Gm x1 then 1250 mg IV q24h for est AUC = 544 Goal AUC = 400-500 F/u scr/cultures/levels  Weight: 205 lb (93 kg)  Temp (24hrs), Avg:97.7 F (36.5 C), Min:97.7 F (36.5 C), Max:97.7 F (36.5 C)  Recent Labs  Lab 02/24/18 1805 02/24/18 1812  WBC 15.9*  --   CREATININE 1.29*  --   LATICACIDVEN  --  1.37    Estimated Creatinine Clearance: 45.3 mL/min (A) (by C-G formula based on SCr of 1.29 mg/dL (H)).    Allergies  Allergen Reactions  . Neosporin [Neomycin-Bacitracin Zn-Polymyx] Other (See Comments)    Makes his skin red   . Penicillins Hives and Itching  . Penicillin G Rash    Antimicrobials this admission: 6/11 levaquin >> x1 ED 6/11 cefepime >>  6/11 vancomycin >>  Dose adjustments this admission:   Microbiology results:  BCx:   UCx:    Sputum:    MRSA PCR:   Thank you for allowing pharmacy to be a part of this patient's care.  Dorrene German 02/24/2018 10:56 PM

## 2018-02-24 NOTE — ED Triage Notes (Signed)
Patient is from home and transported via [facility]. Patient is failure to thrive due to not eating for 3 days. Also, patients spouse reports to EMS that he has recently had a Lasix change.

## 2018-02-24 NOTE — ED Provider Notes (Signed)
[facility] DEPT Provider Note   CSN: [id number] Arrival date & time: 02/24/18  1607     History   Chief Complaint Chief Complaint  Patient presents with  . Failure To Thrive    HPI [name] is a 84 y.o. male.  HPI Patient with several months of worsening generalized weakness, confusion, weight loss and decreased appetite.  Wife states patient has lost about 25 pounds over this period.  He has been thirsty but not taking solid foods.  Wife is been restricting his fluid intake because of history of CHF.  Patient continues to be on Lasix.  He is denying any pain.  Specifically no headache, chest pain, abdominal pain.  No vomiting or diarrhea.  No recent fever or chills. Past Medical History:  Diagnosis Date  . Arthritis   . Bladder cancer ([place])   . CAD (coronary artery disease)    cath 02/28/11: dLM 10%, pLAD 20%, pRCA calcified nodule 50% (FFR 0.86 - not significant), mRCA 40-50%, mildly elevated filling pressures, EF 50-55%  . Cataract   . Chronic kidney disease   . Gait abnormality 05/26/2017  . Hard of hearing   . Hyperlipidemia   . Hypertension    LOV with EKG 06/11/11 Dr Caryl Comes  [facility]  . Memory difficulty 05/26/2017  . Prostate cancer ([place])    status post brachytherapy---in 2003  . Sleep apnea    STOP BANG SCORE 4  . Ventricular tachycardia, nonsustained/bigeminy    echo 6/12: EF 55%, basal inferolat AK, mild LVH    Patient Active Problem List   Diagnosis Date Noted  . Pleural effusion on right 12/05/2017  . Acute respiratory failure with hypoxia ([place]) 12/05/2017  . Memory difficulty 05/26/2017  . Gait abnormality 05/26/2017  . Constipation 01/03/2017  . Atrial fibrillation ([place]) 01/03/2017  . Hyponatremia 01/03/2017  . First degree AV block 06/28/2013  . Bladder cancer ([place]) 07/01/2012  . Cataracts, bilateral 03/14/2011  . Ventricular bigeminy   . Nonsustained ventricular tachycardia ([place])   . Nonobstructive CAD (coronary  artery disease)   . Hypertension   . Hyperlipidemia     Past Surgical History:  Procedure Laterality Date  . APPENDECTOMY    . CARDIAC CATHETERIZATION  6/12  . CYSTOSCOPY  05/20/2012   Procedure: CYSTOSCOPY;  Surgeon: Alexis Frock, MD;  Location: [facility];  Service: Urology;  Laterality: N/A;  . CYSTOSCOPY  07/01/2012   Procedure: CYSTOSCOPY;  Surgeon: Molli Hazard, MD;  Location: [facility];  Service: Urology;  Laterality: N/A;  . EYE SURGERY     left cataract extraction with IOL  . SKIN CANCER EXCISION     x 4  . TRANSURETHRAL RESECTION OF BLADDER TUMOR  05/20/2012   Procedure: TRANSURETHRAL RESECTION OF BLADDER TUMOR (TURBT);  Surgeon: Alexis Frock, MD;  Location: [facility];  Service: Urology;  Laterality: N/A;  [place]   . TRANSURETHRAL RESECTION OF BLADDER TUMOR  07/01/2012   Procedure: TRANSURETHRAL RESECTION OF BLADDER TUMOR (TURBT);  Surgeon: Molli Hazard, MD;  Location: [facility];  Service: Urology;  Laterality: N/A;  Cystoscopy, TURBT, be prepared for Open Cystorraphy            Home Medications    Prior to Admission medications   Medication Sig Start Date End Date Taking? Authorizing Provider  aspirin EC 81 MG tablet Take 81 mg by mouth daily.     [provider]  atorvastatin (LIPITOR) 20 MG tablet Take 20 mg by mouth every evening.  [provider]  fluticasone (FLONASE) 50 MCG/ACT nasal spray Place 1 spray into the nose daily.  11/14/17 11/14/18  [provider]  furosemide (LASIX) 20 MG tablet Take 1 tablet (20 mg total) by mouth every Monday, Wednesday, and Friday. 12/10/17   Florencia Reasons, MD  furosemide (LASIX) 40 MG tablet Take 40 mg by mouth daily. 01/19/18   [provider]  losartan (COZAAR) 25 MG tablet Take 1 tablet (25 mg total) by mouth daily. 12/10/17   Florencia Reasons, MD  prednisoLONE acetate (PRED FORTE) 1 % ophthalmic suspension Place 2 drops into both eyes 2 (two) times daily as needed (redness).    [provider]    Family History Family History  Problem Relation Age of Onset  . Arthritis Son   . Heart Problems Son        Aortic valve replacement  . Lung cancer Daughter   . Heart attack Neg Hx   . Stroke Neg Hx   . Hypertension Neg Hx     Social History Social History   Tobacco Use  . Smoking status: Former Smoker    Types: Cigars    Last attempt to quit: 05/20/1995    Years since quitting: 22.7  . Smokeless tobacco: Current User    Types: Chew  Substance Use Topics  . Alcohol use: Not Currently  . Drug use: No     Allergies   Neosporin [neomycin-bacitracin zn-polymyx]; Penicillins; and Penicillin g   Review of Systems Review of Systems  Constitutional: Positive for activity change, appetite change, fatigue and unexpected weight change.  HENT: Negative for trouble swallowing.   Eyes: Negative for visual disturbance.  Respiratory: Negative for cough, shortness of breath and wheezing.   Cardiovascular: Negative for chest pain, palpitations and leg swelling.  Gastrointestinal: Negative for abdominal pain, constipation, diarrhea, nausea and vomiting.  Genitourinary: Positive for frequency. Negative for dysuria, flank pain and hematuria.  Musculoskeletal: Negative for back pain, myalgias, neck pain and neck stiffness.  Skin: Negative for rash and wound.  Neurological: Positive for weakness. Negative for dizziness, light-headedness, numbness and headaches.  All other systems reviewed and are negative.    Physical Exam Updated Vital Signs BP 119/76 (BP Location: Right Arm)   Pulse 86   Temp 97.7 F (36.5 C) (Oral)   Resp (!) 24   SpO2 96%   Physical Exam  Constitutional: He is oriented to person, place, and time. He appears well-developed and well-nourished. No distress.  HENT:  Head: Normocephalic and atraumatic.  Dry mucous membranes  Eyes: Pupils are equal, round, and reactive to light. EOM are normal.  Neck: Normal range of motion. Neck supple.  Cardiovascular:  Normal rate and regular rhythm. Exam reveals no gallop and no friction rub.  No murmur heard. Pulmonary/Chest: Effort normal. No respiratory distress. He has no wheezes. He has rales.  Few crackles in bilateral bases.  Abdominal: Soft. Bowel sounds are normal. He exhibits no distension and no mass. There is no tenderness. There is no rebound and no guarding.  Musculoskeletal: Normal range of motion. He exhibits edema. He exhibits no tenderness.  Mild bilateral lower extremity edema.  Neurological: He is alert and oriented to person, place, and time.  Moves all extremities without focal deficit.  Sensation intact.  Skin: Skin is warm and dry. Capillary refill takes less than 2 seconds. No rash noted. He is not diaphoretic. No erythema.  Psychiatric: He has a normal mood and affect. His behavior is normal.  Nursing  note and vitals reviewed.    ED Treatments / Results  Labs (all labs ordered are listed, but only abnormal results are displayed) Labs Reviewed  CBC WITH DIFFERENTIAL/PLATELET - Abnormal; Notable for the following components:      Result Value   WBC 15.9 (*)    RBC 4.09 (*)    Neutro Abs 12.4 (*)    Monocytes Absolute 1.7 (*)    All other components within normal limits  COMPREHENSIVE METABOLIC PANEL - Abnormal; Notable for the following components:   Sodium 131 (*)    Chloride 87 (*)    CO2 33 (*)    Glucose, Bld 126 (*)    Creatinine, Ser 1.29 (*)    Calcium 8.4 (*)    Total Protein 5.9 (*)    Albumin 2.4 (*)    AST 69 (*)    ALT 86 (*)    GFR calc non Af Amer 48 (*)    GFR calc Af Amer 55 (*)    All other components within normal limits  TROPONIN I - Abnormal; Notable for the following components:   Troponin I 0.12 (*)    All other components within normal limits  URINALYSIS, ROUTINE W REFLEX MICROSCOPIC - Abnormal; Notable for the following components:   APPearance TURBID (*)    Hgb urine dipstick SMALL (*)    Protein, ur >=300 (*)    Leukocytes, UA  MODERATE (*)    WBC, UA >50 (*)    Bacteria, UA MANY (*)    All other components within normal limits  CULTURE, BLOOD (ROUTINE X 2)  CULTURE, BLOOD (ROUTINE X 2)  MAGNESIUM  I-STAT CG4 LACTIC ACID, ED    EKG EKG Interpretation  Date/Time:  Tuesday February 24 2018 21:30:53 EDT Ventricular Rate:  84 PR Interval:    QRS Duration: 105 QT Interval:  421 QTC Calculation: 498 R Axis:   -56 Text Interpretation:  Paired ventricular premature complexes Left anterior fascicular block Anteroseptal infarct, old Confirmed by Julianne Rice [phone]) on 02/24/2018 9:40:23 PM   Radiology Dg Chest Port 1 View  Result Date: 02/24/2018 CLINICAL DATA:  Failure to thrive, not eating for 3 days, history hypertension and prostate cancer EXAM: PORTABLE CHEST 1 VIEW COMPARISON:  Portable exam 1748 hours compared to 12/06/2017 FINDINGS: Enlargement of cardiac silhouette. Mediastinal contours and pulmonary vascularity normal. New RIGHT basilar infiltrate question pneumonia versus aspiration. Probable small RIGHT pleural effusion. Upper lungs clear. No pneumothorax. Bones unremarkable. IMPRESSION: New RIGHT basilar consolidation which could represent pneumonia or aspiration. Probable small RIGHT pleural effusion. Enlargement of cardiac silhouette. Electronically Signed   By: Lavonia Dana M.D.   On: 02/24/2018 18:13    Procedures Procedures (including critical care time)  Medications Ordered in ED Medications  vancomycin (VANCOCIN) IVPB 1000 mg/200 mL premix (1,000 mg Intravenous New Bag/Given 02/24/18 2113)  sodium chloride 0.9 % bolus 500 mL (0 mLs Intravenous Stopped 02/24/18 2007)  levofloxacin (LEVAQUIN) IVPB 500 mg (0 mg Intravenous Stopped 02/24/18 2023)  sodium chloride 0.9 % bolus 500 mL (0 mLs Intravenous Stopped 02/24/18 2007)     Initial Impression / Assessment and Plan / ED Course  I have reviewed the triage vital signs and the nursing notes.  Pertinent labs & imaging results that were available  during my care of the patient were reviewed by me and considered in my medical decision making (see chart for details).     Patient appears dehydrated.  Hypotensive on presentation.  Mentating well.  Will start  IV fluids. Blood pressure improved with IV fluids.  Evidence of right basilar infiltrate concerning for pneumonia.  Patient also has a UTI.  Mild elevation of troponin without ischemic findings.  Initiated IV fluids and antibiotics.  Hospitalist to admit. Final Clinical Impressions(s) / ED Diagnoses   Final diagnoses:  Dehydration  Community acquired pneumonia of right lower lobe of lung ([place])  Acute lower UTI  Elevated troponin    ED Discharge Orders    None       Julianne Rice, MD 02/24/18 2142

## 2018-02-24 NOTE — H&P (Addendum)
History and Physical    [name] FIE:[id number] DOB: 02/15/1927 DOA: 02/24/2018  PCP: Gregor Hams, FNP   Patient coming from: Home   Chief Complaint: SOB, Cough, lethargy  HPI: [name] is a 84 y.o. male with medical history significant for ventricular bigeminy, NSVT, nonobstructive CAD, HTN, bladder cancer, Fibrillation not on anticoagulation.  Patient was brought to the ED via EMS with complaints of poor p.o. intake over the past 3 days, weakness confusion and cough. Patient is extremely hard of hearing, is awake and alert, and able to answer some questions.  Patient spouse has been restricting patient's fluid intake because of CHF history.  Patient denies chest pain, abdominal pain, vomiting or loose stools, he denies fever or chills, denies dysuria.   Hospital admission 3/22- 12/09/17-acute hypoxic respiratory failure likely from acute on chronic systolic CHF.  ED Course: Blood pressure systolic initially 16/60 improved with 1L bolus IV fluids to 630 systolic.  WBC 15.9, creatinine - 1.29.  Lactic acid 1.37, troponin elevated 0.12, magnesium 1.9, UA moderate leukocytes bacteria, chest x-ray-new right basilar consolidation which could represent pneumonia or aspiration, probable small right pleural effusion.  Patient was started on IV vancomycin and Levaquin in ED hospitalist was called to admit for pneumonia and UTI.  As per HPI otherwise 10 point review of systems negative.   Past Medical History:  Diagnosis Date  . Arthritis   . Bladder cancer ([place])   . CAD (coronary artery disease)    cath 02/28/11: dLM 10%, pLAD 20%, pRCA calcified nodule 50% (FFR 0.86 - not significant), mRCA 40-50%, mildly elevated filling pressures, EF 50-55%  . Cataract   . Chronic kidney disease   . Gait abnormality 05/26/2017  . Hard of hearing   . Hyperlipidemia   . Hypertension    LOV with EKG 06/11/11 Dr Caryl Comes  [facility]  . Memory difficulty 05/26/2017  . Prostate cancer ([place])    status post  brachytherapy---in 2003  . Sleep apnea    STOP BANG SCORE 4  . Ventricular tachycardia, nonsustained/bigeminy    echo 6/12: EF 55%, basal inferolat AK, mild LVH    Past Surgical History:  Procedure Laterality Date  . APPENDECTOMY    . CARDIAC CATHETERIZATION  6/12  . CYSTOSCOPY  05/20/2012   Procedure: CYSTOSCOPY;  Surgeon: Alexis Frock, MD;  Location: [facility];  Service: Urology;  Laterality: N/A;  . CYSTOSCOPY  07/01/2012   Procedure: CYSTOSCOPY;  Surgeon: Molli Hazard, MD;  Location: [facility];  Service: Urology;  Laterality: N/A;  . EYE SURGERY     left cataract extraction with IOL  . SKIN CANCER EXCISION     x 4  . TRANSURETHRAL RESECTION OF BLADDER TUMOR  05/20/2012   Procedure: TRANSURETHRAL RESECTION OF BLADDER TUMOR (TURBT);  Surgeon: Alexis Frock, MD;  Location: [facility];  Service: Urology;  Laterality: N/A;  [place]   . TRANSURETHRAL RESECTION OF BLADDER TUMOR  07/01/2012   Procedure: TRANSURETHRAL RESECTION OF BLADDER TUMOR (TURBT);  Surgeon: Molli Hazard, MD;  Location: [facility];  Service: Urology;  Laterality: N/A;  Cystoscopy, TURBT, be prepared for Open Cystorraphy         reports that he quit smoking about 22 years ago. His smoking use included cigars. His smokeless tobacco use includes chew. He reports that he drank alcohol. He reports that he does not use drugs.  Allergies  Allergen Reactions  . Neosporin [Neomycin-Bacitracin Zn-Polymyx] Other (See Comments)    Makes his skin red   .  Penicillins Hives and Itching  . Penicillin G Rash    Family History  Problem Relation Age of Onset  . Arthritis Son   . Heart Problems Son        Aortic valve replacement  . Lung cancer Daughter   . Heart attack Neg Hx   . Stroke Neg Hx   . Hypertension Neg Hx     Prior to Admission medications   Medication Sig Start Date End Date Taking? Authorizing Provider  aspirin EC 81 MG tablet Take 81 mg by mouth daily.     [provider]  atorvastatin  (LIPITOR) 20 MG tablet Take 20 mg by mouth every evening.     [provider]  fluticasone (FLONASE) 50 MCG/ACT nasal spray Place 1 spray into the nose daily.  11/14/17 11/14/18  [provider]  furosemide (LASIX) 20 MG tablet Take 1 tablet (20 mg total) by mouth every Monday, Wednesday, and Friday. 12/10/17   Florencia Reasons, MD  furosemide (LASIX) 40 MG tablet Take 40 mg by mouth daily. 01/19/18   [provider]  losartan (COZAAR) 25 MG tablet Take 1 tablet (25 mg total) by mouth daily. 12/10/17   Florencia Reasons, MD  prednisoLONE acetate (PRED FORTE) 1 % ophthalmic suspension Place 2 drops into both eyes 2 (two) times daily as needed (redness).    [provider]    Physical Exam: Vitals:   02/24/18 2030 02/24/18 2100 02/24/18 2130 02/24/18 2200  BP: 113/77 107/79 122/70 114/77  Pulse: 85 81 76 81  Resp: (!) 27 (!) 30 (!) 27 (!) 30  Temp:      TempSrc:      SpO2: 96% 96% 97% 99%    Constitutional: patient weak, but calm, comfortable, extremely hard of hearing Vitals:   02/24/18 2030 02/24/18 2100 02/24/18 2130 02/24/18 2200  BP: 113/77 107/79 122/70 114/77  Pulse: 85 81 76 81  Resp: (!) 27 (!) 30 (!) 27 (!) 30  Temp:      TempSrc:      SpO2: 96% 96% 97% 99%   Eyes: PERRL, lids and conjunctivae normal ENMT: Mucous membranes are dry Posterior pharynx clear of any exudate or lesions.  Neck: normal, supple, no masses, no thyromegaly Respiratory: clear to auscultation bilaterally-to auscultation, no wheezing, no crackles. Normal respiratory effort. No accessory muscle use. On 3L [place]. Cardiovascular: Regular rate and rhythm, no murmurs / rubs / gallops. No extremity edema. 2+ pedal pulses.  Abdomen: no tenderness, no masses palpated. No hepatosplenomegaly. Bowel sounds positive.  Musculoskeletal: no clubbing / cyanosis. No joint deformity upper and lower extremities. Good ROM, no contractures. Normal muscle tone.  Skin: no rashes, lesions, ulcers. No  induration Neurologic: No gross cranial nerve abnormality. Strength 5/5 in all 4.  Psychiatric: Normal judgment and insight. Alert and oriented x person, place. Normal mood.    Labs on Admission: I have personally reviewed following labs and imaging studies  CBC: Recent Labs  Lab 02/24/18 1805  WBC 15.9*  NEUTROABS 12.4*  HGB 13.2  HCT 39.6  MCV 96.8  PLT 166   Basic Metabolic Panel: Recent Labs  Lab 02/24/18 1805  NA 131*  K 3.6  CL 87*  CO2 33*  GLUCOSE 126*  BUN 18  CREATININE 1.29*  CALCIUM 8.4*  MG 1.9   Liver Function Tests: Recent Labs  Lab 02/24/18 1805  AST 69*  ALT 86*  ALKPHOS 101  BILITOT 1.0  PROT 5.9*  ALBUMIN 2.4*   Cardiac Enzymes:  Recent Labs  Lab 02/24/18 1805  TROPONINI 0.12*   Urine analysis:    Component Value Date/Time   COLORURINE YELLOW 02/24/2018 1836   APPEARANCEUR TURBID (A) 02/24/2018 1836   LABSPEC 1.012 02/24/2018 1836   PHURINE 7.0 02/24/2018 1836   GLUCOSEU NEGATIVE 02/24/2018 1836   HGBUR SMALL (A) 02/24/2018 1836   BILIRUBINUR NEGATIVE 02/24/2018 1836   KETONESUR NEGATIVE 02/24/2018 1836   PROTEINUR >=300 (A) 02/24/2018 1836   NITRITE NEGATIVE 02/24/2018 1836   LEUKOCYTESUR MODERATE (A) 02/24/2018 1836    Radiological Exams on Admission: Dg Chest Port 1 View  Result Date: 02/24/2018 CLINICAL DATA:  Failure to thrive, not eating for 3 days, history hypertension and prostate cancer EXAM: PORTABLE CHEST 1 VIEW COMPARISON:  Portable exam 4174 hours compared to 12/06/2017 FINDINGS: Enlargement of cardiac silhouette. Mediastinal contours and pulmonary vascularity normal. New RIGHT basilar infiltrate question pneumonia versus aspiration. Probable small RIGHT pleural effusion. Upper lungs clear. No pneumothorax. Bones unremarkable. IMPRESSION: New RIGHT basilar consolidation which could represent pneumonia or aspiration. Probable small RIGHT pleural effusion. Enlargement of cardiac silhouette. Electronically Signed   By:  Lavonia Dana M.D.   On: 02/24/2018 18:13    EKG: Independently reviewed. LAFB, PVCs, seen on prior EKGs.  Assessment/Plan Active Problems:   Hypertension   Memory difficulty   Pneumonia  Pneumonia-recent hospital admission 3/22-3/26, barely meeting criteria for HCAP. Cough, leukocytosis- 15., hypotension. Lactic acid- 1.3. On home O2.  Chest x-ray-right basilar consolidation- PNA Vs aspiration.  -IV Vanco and cefepime per pharmacy (penicillin allergy noted) -Will add IV metronidazole for possible aspiration pneumonia -Swallow evaluation -Follow-up blood cultures -Narrow antibiotics pending blood cultures and clinical course  UTI-leukocytosis, lethargic, confusion.  UA suggestive. -Follow-up urine cultures -IV cefepime for pharmacy  Metabolic encephalopathy-likely from UTI and pneumonia, dehydration. -Antibiotics, blood cultures -PT eval when able may need placement  Mild AKI-creatinine 1.29.  Basline- 0.7-1. with hypotension-likely secondary to dehydration. Diff infection related.  No lactic acidosis. 1L in the ED -Gentle hydration N/s = 20KCl X 12 hs 75cc/hr, with CHF history  CHF, A. fib history, ventricular bigeminy- in the past was on propafenone d/c'd by Dr. Caryl Comes.  Not on anticoagulation 2/2 falls, dementia poor vision.  Last echo 11/2017-EF 35 to 40% diffuse hypokinesis, patient was evaluated by cardiology last admission, drop in EF (previously 60-65%  12/2016) thought to be nonischemic.     DVT prophylaxis: Lovenox Code Status: Full Family Communication:No Family at bedside Disposition Plan: Rounding team Consults called: none  Admission status: inpt, tele   Bethena Roys MD [facility] Pager [phone] From 6PM-2AM.  Otherwise please contact night-coverage [email or web address] Password [facility]  02/24/2018, 10:43 PM

## 2018-02-24 NOTE — ED Notes (Signed)
Date and time results received: 02/24/18 7:11 PM  (use smartphrase ".now" to insert current time)  Test: Troponin Critical Value: 0.12  Name of Provider Notified: Dr. Lita Mains  Orders Received? Or Actions Taken?: MD notified

## 2018-02-25 ENCOUNTER — Other Ambulatory Visit: Payer: Self-pay

## 2018-02-25 DIAGNOSIS — N179 Acute kidney failure, unspecified: Secondary | ICD-10-CM

## 2018-02-25 DIAGNOSIS — G9341 Metabolic encephalopathy: Secondary | ICD-10-CM

## 2018-02-25 DIAGNOSIS — J189 Pneumonia, unspecified organism: Secondary | ICD-10-CM

## 2018-02-25 DIAGNOSIS — N390 Urinary tract infection, site not specified: Secondary | ICD-10-CM

## 2018-02-25 LAB — BASIC METABOLIC PANEL
Anion gap: 9 (ref 5–15)
BUN: 17 mg/dL (ref 6–20)
CHLORIDE: 90 mmol/L — AB (ref 101–111)
CO2: 35 mmol/L — ABNORMAL HIGH (ref 22–32)
CREATININE: 1.13 mg/dL (ref 0.61–1.24)
Calcium: 8.4 mg/dL — ABNORMAL LOW (ref 8.9–10.3)
GFR calc non Af Amer: 56 mL/min — ABNORMAL LOW (ref >60)
Glucose, Bld: 97 mg/dL (ref 65–99)
Potassium: 3.9 mmol/L (ref 3.5–5.1)
SODIUM: 134 mmol/L — AB (ref 135–145)

## 2018-02-25 LAB — CBC
HCT: 38.5 % — ABNORMAL LOW (ref 39.0–52.0)
Hemoglobin: 12.8 g/dL — ABNORMAL LOW (ref 13.0–17.0)
MCH: 32.2 pg (ref 26.0–34.0)
MCHC: 33.2 g/dL (ref 30.0–36.0)
MCV: 97 fL (ref 78.0–100.0)
Platelets: 282 10*3/uL (ref 150–400)
RBC: 3.97 MIL/uL — AB (ref 4.22–5.81)
RDW: 13.9 % (ref 11.5–15.5)
WBC: 13.3 10*3/uL — AB (ref 4.0–10.5)

## 2018-02-25 LAB — MRSA PCR SCREENING: MRSA BY PCR: NEGATIVE

## 2018-02-25 MED ORDER — ENOXAPARIN SODIUM 40 MG/0.4ML ~~LOC~~ SOLN
40.0000 mg | SUBCUTANEOUS | Status: DC
  Administered 2018-02-25 – 2018-02-28 (×4): 40 mg via SUBCUTANEOUS
  Filled 2018-02-25 (×4): qty 0.4

## 2018-02-25 MED ORDER — POTASSIUM CHLORIDE IN NACL 20-0.9 MEQ/L-% IV SOLN
INTRAVENOUS | Status: AC
  Administered 2018-02-25: 06:00:00 via INTRAVENOUS
  Filled 2018-02-25: qty 1000

## 2018-02-25 MED ORDER — ATORVASTATIN CALCIUM 20 MG PO TABS: 20.0000 mg | ORAL_TABLET | Freq: Every evening | ORAL | Status: DC

## 2018-02-25 MED ORDER — METRONIDAZOLE IN NACL 5-0.79 MG/ML-% IV SOLN
500.0000 mg | Freq: Three times a day (TID) | INTRAVENOUS | Status: DC
  Administered 2018-02-25 – 2018-02-26 (×5): 500 mg via INTRAVENOUS
  Filled 2018-02-25 (×5): qty 100

## 2018-02-25 MED ORDER — ASPIRIN EC 81 MG PO TBEC
81.0000 mg | DELAYED_RELEASE_TABLET | Freq: Every day | ORAL | Status: DC
  Administered 2018-02-25 – 2018-02-28 (×4): 81 mg via ORAL
  Filled 2018-02-25 (×4): qty 1

## 2018-02-25 MED ORDER — ORAL CARE MOUTH RINSE
15.0000 mL | Freq: Two times a day (BID) | OROMUCOSAL | Status: DC
  Administered 2018-02-25 – 2018-02-26 (×3): 15 mL via OROMUCOSAL

## 2018-02-25 MED ORDER — ONDANSETRON HCL 4 MG/2ML IJ SOLN: 4.0000 mg | Freq: Four times a day (QID) | INTRAMUSCULAR | Status: DC | PRN

## 2018-02-25 MED ORDER — ONDANSETRON HCL 4 MG PO TABS: 4.0000 mg | ORAL_TABLET | Freq: Four times a day (QID) | ORAL | Status: DC | PRN

## 2018-02-25 MED ORDER — FLUTICASONE PROPIONATE 50 MCG/ACT NA SUSP
1.0000 | Freq: Every day | NASAL | Status: DC
  Administered 2018-02-25 – 2018-02-28 (×3): 1 via NASAL
  Filled 2018-02-25: qty 16

## 2018-02-25 MED ORDER — ACETAMINOPHEN 325 MG PO TABS
650.0000 mg | ORAL_TABLET | Freq: Four times a day (QID) | ORAL | Status: DC | PRN
  Administered 2018-02-25: 650 mg via ORAL
  Filled 2018-02-25: qty 2

## 2018-02-25 NOTE — Care Management Note (Signed)
Case Management Note  Patient Details  Name: [name] MRN: [id number] Date of Birth: 07/17/1927  Subjective/Objective:   Pt admitted with Pneumonia                 Action/Plan: Pt is active with [facility]   Expected Discharge Date:                  Expected Discharge Plan:  [name]  In-House Referral:     Discharge planning Services  CM Consult  Post Acute Care Choice:    Choice offered to:     DME Arranged:    DME Agency:     HH Arranged:  RN, PT [place] Agency:  [place]  Status of Service:     If discussed at [place] of Stay Meetings, dates discussed:    Additional CommentsPurcell Mouton, RN 02/25/2018, 11:41 AM

## 2018-02-25 NOTE — Progress Notes (Signed)
[facility]  Patient Status: Active (receiving services up to time of hospitalization)  [facility] is providing the following services: RN  If patient discharges after hours, please call [phone].   [name] 02/25/2018, 10:28 AM

## 2018-02-25 NOTE — Progress Notes (Signed)
PROGRESS NOTE [facility]   [name]   BMW:[id number] DOB: 01/31/1927  DOA: 02/24/2018 PCP: Gregor Hams, FNP   Brief Narrative:  [name] 84 year old male with medical history significant for NSVT, CAD, hypertension bladder cancer and A. fib not on AC.  Patient was brought to the emergency department via EMS with complaining of poor p.o. intake, weakness, confusion and cough.  Upon ED evaluation was found to have hypotension which improved with IV fluids, elevated WBC at 15.9, elevated creatinine, chest x-ray with basilar consolidation and UA concerning for UTI.  Patient was admitted with working diagnosis of pneumonia and possible UTI.  Patient was started on empiric antibiotic.  Subjective: Patient seen and examined, report feeling about the same as yesterday.  Denies chest pain, shortness of breath and abdominal pain.  Cough still present.  Afebrile  Assessment & Plan: Community-acquired pneumonia Cough, leukocytosis with chest x-ray showing right basilar consolidation Agree with antibiotics, continue cefepime, Flagyl and vancomycin.  Will check MRSA if negative can discontinue Vanc.  Swallow eval from which shows no difficulty with swallowing. ?  Silent aspiration. Follow-up blood cultures, de-escalate antibiotic pending results.  ? UTI -UA grossly abnormal Patient on antibiotics, follow urine culture  Acute metabolic encephalopathy Likely due to infectious process and dehydration Treat underlying causes, resolving  AKI From hypotension and dehydration Continue gentle hydration Monitor for signs of fluid overload, given history of CHF  Chronic systolic CHF, A. fib and ventricular bigeminy Echo 11/2017 shows EF of 35 to 40% with diffuse hypokinesis. Continue medical treatment and monitor Not on any anticoagulation. If creatinine remains stable resume losartan in a.m.  DVT prophylaxis: Lovenox Code Status: Full code Family Communication: None at  bedside Disposition Plan: Home in 2 to 3 days  Consultants:   None   Procedures:   None   Antimicrobials: Anti-infectives (From admission, onward)   Start     Dose/Rate Route Frequency Ordered Stop   02/25/18 0800  vancomycin (VANCOCIN) 1,250 mg in sodium chloride 0.9 % 250 mL IVPB     1,250 mg 166.7 mL/hr over 90 Minutes Intravenous Every 24 hours 02/24/18 2255     02/25/18 0200  metroNIDAZOLE (FLAGYL) IVPB 500 mg     500 mg 100 mL/hr over 60 Minutes Intravenous Every 8 hours 02/25/18 0104     02/24/18 2300  ceFEPIme (MAXIPIME) 1 g in sodium chloride 0.9 % 100 mL IVPB     1 g 200 mL/hr over 30 Minutes Intravenous Daily at bedtime 02/24/18 2255     02/24/18 1845  vancomycin (VANCOCIN) IVPB 1000 mg/200 mL premix     1,000 mg 200 mL/hr over 60 Minutes Intravenous  Once 02/24/18 1840 02/24/18 2220   02/24/18 1845  levofloxacin (LEVAQUIN) IVPB 500 mg     500 mg 100 mL/hr over 60 Minutes Intravenous  Once 02/24/18 1840 02/24/18 2023          Objective: Vitals:   02/25/18 0624 02/25/18 0637 02/25/18 1235 02/25/18 1412  BP: (!) 84/70 (!) 152/89  115/71  Pulse: (!) 52 86  80  Resp: 20   14  Temp: 97.6 F (36.4 C)   98.3 F (36.8 C)  TempSrc:    Oral  SpO2: 90%   97%  Weight:      Height:   5\' 10"  (1.778 m)     Intake/Output Summary (Last 24 hours) at 02/25/2018 1533 Last data filed at 02/25/2018 1405 Gross per 24 hour  Intake 2508.53 ml  Output 600 ml  Net 1908.53 ml   Filed Weights   02/24/18 2250  Weight: 93 kg (205 lb)    Examination:  General exam: NAD  HEENT: OP moist and clear Respiratory system: Decrease BS b/l, no wheezes, crackle or rhonchi Cardiovascular system: S1 & S2 heard, RRR. No JVD, murmurs, rubs or gallops Gastrointestinal system: Abdomen is nondistended, soft and nontender.  Central nervous system: Confuse, oriented to person and place only. Non focal  Extremities: LE edema 1+.  Skin: No rashess Psychiatry: Judgement and insight appear  normal. Mood & affect appropriate.    Data Reviewed: I have personally reviewed following labs and imaging studies  CBC: Recent Labs  Lab 02/24/18 1805 02/25/18 0410  WBC 15.9* 13.3*  NEUTROABS 12.4*  --   HGB 13.2 12.8*  HCT 39.6 38.5*  MCV 96.8 97.0  PLT 312 932   Basic Metabolic Panel: Recent Labs  Lab 02/24/18 1805 02/25/18 0410  NA 131* 134*  K 3.6 3.9  CL 87* 90*  CO2 33* 35*  GLUCOSE 126* 97  BUN 18 17  CREATININE 1.29* 1.13  CALCIUM 8.4* 8.4*  MG 1.9  --    GFR: Estimated Creatinine Clearance: 51.8 mL/min (by C-G formula based on SCr of 1.13 mg/dL). Liver Function Tests: Recent Labs  Lab 02/24/18 1805  AST 69*  ALT 86*  ALKPHOS 101  BILITOT 1.0  PROT 5.9*  ALBUMIN 2.4*   No results for input(s): LIPASE, AMYLASE in the last 168 hours. No results for input(s): AMMONIA in the last 168 hours. Coagulation Profile: No results for input(s): INR, PROTIME in the last 168 hours. Cardiac Enzymes: Recent Labs  Lab 02/24/18 1805  TROPONINI 0.12*   BNP (last 3 results) No results for input(s): PROBNP in the last 8760 hours. HbA1C: No results for input(s): HGBA1C in the last 72 hours. CBG: No results for input(s): GLUCAP in the last 168 hours. Lipid Profile: No results for input(s): CHOL, HDL, LDLCALC, TRIG, CHOLHDL, LDLDIRECT in the last 72 hours. Thyroid Function Tests: No results for input(s): TSH, T4TOTAL, FREET4, T3FREE, THYROIDAB in the last 72 hours. Anemia Panel: No results for input(s): VITAMINB12, FOLATE, FERRITIN, TIBC, IRON, RETICCTPCT in the last 72 hours. Sepsis Labs: Recent Labs  Lab 02/24/18 1812  LATICACIDVEN 1.37    Recent Results (from the past 240 hour(s))  Culture, blood (Routine X 2) w Reflex to ID Panel     Status: None (Preliminary result)   Collection Time: 02/24/18  6:13 PM  Result Value Ref Range Status   Specimen Description   Final    BLOOD RIGHT FOREARM Performed at [facility] [address], [place], [place] [address]    Special Requests   Final    BOTTLES DRAWN AEROBIC AND ANAEROBIC Blood Culture results may not be optimal due to an inadequate volume of blood received in culture bottles Performed at [facility] [address], [place], [place] [address]    Culture   Final    NO GROWTH < 24 HOURS Performed at [facility] [address], [place], [place] [address]    Report Status PENDING  Incomplete  Culture, blood (Routine X 2) w Reflex to ID Panel     Status: None (Preliminary result)   Collection Time: 02/24/18  6:13 PM  Result Value Ref Range Status   Specimen Description   Final    BLOOD RIGHT HAND Performed at [facility] [address]  [place], [place] [address]    Special Requests   Final    BOTTLES DRAWN AEROBIC AND ANAEROBIC Blood Culture adequate volume Performed at [facility] [address], [place], [place] [address]    Culture   Final    NO GROWTH < 24 HOURS Performed at [facility] [address], [place],  [address]    Report Status PENDING  Incomplete     Radiology Studies: Dg Chest Port 1 View  Result Date: 02/24/2018 CLINICAL DATA:  Failure to thrive, not eating for 3 days, history hypertension and prostate cancer EXAM: PORTABLE CHEST 1 VIEW COMPARISON:  Portable exam 9678 hours compared to 12/06/2017 FINDINGS: Enlargement of cardiac silhouette. Mediastinal contours and pulmonary vascularity normal. New RIGHT basilar infiltrate question pneumonia versus aspiration. Probable small RIGHT pleural effusion. Upper lungs clear. No pneumothorax. Bones unremarkable. IMPRESSION: New RIGHT basilar consolidation which could represent pneumonia or aspiration. Probable small RIGHT pleural effusion. Enlargement of cardiac silhouette. Electronically Signed   By: Lavonia Dana M.D.   On: 02/24/2018 18:13    Scheduled Meds: . aspirin EC  81 mg Oral Daily  . enoxaparin (LOVENOX)  injection  40 mg Subcutaneous Q24H  . fluticasone  1 spray Each Nare Daily   Continuous Infusions: . ceFEPime (MAXIPIME) IV Stopped (02/25/18 0029)  . metronidazole Stopped (02/25/18 1405)  . vancomycin Stopped (02/25/18 0940)     LOS: 1 day    Time spent: Total of 35 minutes spent with pt, greater than 50% of which was spent in discussion of  treatment, counseling and coordination of care   Chipper Oman, MD Pager: Text Page via [email or web address]   If 7PM-7AM, please contact night-coverage [email or web address] 02/25/2018, 3:33 PM   Note - This record has been created using [employer]. Chart creation errors have been sought, but may not always have been located. Such creation errors do not reflect on the standard of medical care.

## 2018-02-25 NOTE — Evaluation (Signed)
Clinical/Bedside Swallow Evaluation Patient Details  Name: GENEVIEVE RITZEL MRN: [id number] Date of Birth: 1926-09-27  Today's Date: 02/25/2018 Time: SLP Start Time (ACUTE ONLY): 35 SLP Stop Time (ACUTE ONLY): 1130 SLP Time Calculation (min) (ACUTE ONLY): 25 min  Past Medical History:  Past Medical History:  Diagnosis Date  . Arthritis   . Bladder cancer ([place])   . CAD (coronary artery disease)    cath 02/28/11: dLM 10%, pLAD 20%, pRCA calcified nodule 50% (FFR 0.86 - not significant), mRCA 40-50%, mildly elevated filling pressures, EF 50-55%  . Cataract   . Chronic kidney disease   . Gait abnormality 05/26/2017  . Hard of hearing   . Hyperlipidemia   . Hypertension    LOV with EKG 06/11/11 Dr Caryl Comes  [facility]  . Memory difficulty 05/26/2017  . Prostate cancer ([place])    status post brachytherapy---in 2003  . Sleep apnea    STOP BANG SCORE 4  . Ventricular tachycardia, nonsustained/bigeminy    echo 6/12: EF 55%, basal inferolat AK, mild LVH   Past Surgical History:  Past Surgical History:  Procedure Laterality Date  . APPENDECTOMY    . CARDIAC CATHETERIZATION  6/12  . CYSTOSCOPY  05/20/2012   Procedure: CYSTOSCOPY;  Surgeon: Alexis Frock, MD;  Location: [facility];  Service: Urology;  Laterality: N/A;  . CYSTOSCOPY  07/01/2012   Procedure: CYSTOSCOPY;  Surgeon: Molli Hazard, MD;  Location: [facility];  Service: Urology;  Laterality: N/A;  . EYE SURGERY     left cataract extraction with IOL  . SKIN CANCER EXCISION     x 4  . TRANSURETHRAL RESECTION OF BLADDER TUMOR  05/20/2012   Procedure: TRANSURETHRAL RESECTION OF BLADDER TUMOR (TURBT);  Surgeon: Alexis Frock, MD;  Location: [facility];  Service: Urology;  Laterality: N/A;  [place]   . TRANSURETHRAL RESECTION OF BLADDER TUMOR  07/01/2012   Procedure: TRANSURETHRAL RESECTION OF BLADDER TUMOR (TURBT);  Surgeon: Molli Hazard, MD;  Location: [facility];  Service: Urology;  Laterality: N/A;  Cystoscopy, TURBT, be prepared for Open  Cystorraphy       HPI:  84 year old male admitted 02/24/18 with failure to thrive, SOB, cough, lethargy. PMH: several months of generalized weakness, confusion, weight loss due to poor po intake. CXR = RLL consoldiation, PNA vs aspiration.   Assessment / Plan / Recommendation Clinical Impression  Pt presents with poor dentition, but otherwise functional oral motor strength. Pt was provided with trials of thin liquid, puree, and cracker. No oral issues or overt s/s aspiration were observed. RN reports no difficulty with po intake or meds. Pt denies swallowing difficulty, but does indicate poor intake at home. Pt exhibits no s/s aspiration, however, silent aspiration cannot be determined at bedside. Pt has advanced age and cognitive decline, but otherwise has no high risk indicators for dysphagia or increased risk for silent aspiration. MBS would rule out silent aspiration, however, given pt's minimal po intake - if results indicate need for restricted consistencies, this may exacerbate issues related to poor intake. Recommend continuing with regular diet/thin liquids.  Recommend consideration of Palliative Care consult to facilitate establishment of goals of care. RN and MD were informed of results and recommendations. ST wil follow for education and assessment of diet tolerance.   SLP Visit Diagnosis: Dysphagia, unspecified (R13.10)    Aspiration Risk  Mild aspiration risk    Diet Recommendation Regular;Thin liquid   Liquid Administration via: Cup;Straw Medication Administration: Whole meds with liquid Supervision: Patient able to self  feed Compensations: Minimize environmental distractions;Slow rate;Small sips/bites Postural Changes: Seated upright at 90 degrees    Other  Recommendations Oral Care Recommendations: Oral care BID   Follow up Recommendations (TBD)      Frequency and Duration min 1 x/week  1 week       Prognosis Prognosis for Safe Diet Advancement: Good Barriers to  Reach Goals: Cognitive deficits(poor po intake)      Swallow Study   General Date of Onset: 02/24/18 HPI: 84 year old male admitted 02/24/18 with failure to thrive, SOB, cough, lethargy. PMH: several months of generalized weakness, confusion, weight loss due to poor po intake. CXR = RLL consoldiation, PNA vs aspiration. Type of Study: Bedside Swallow Evaluation Previous Swallow Assessment: no prior ST intervention Diet Prior to this Study: Regular;Thin liquids Temperature Spikes Noted: No Respiratory Status: Nasal cannula History of Recent Intubation: No Behavior/Cognition: Alert;Cooperative;Pleasant mood(HOH) Oral Cavity Assessment: Within Functional Limits Oral Care Completed by SLP: No Oral Cavity - Dentition: Poor condition;Missing dentition Vision: Functional for self-feeding Self-Feeding Abilities: Able to feed self Patient Positioning: Upright in bed Baseline Vocal Quality: Normal Volitional Cough: Strong Volitional Swallow: Able to elicit    Oral/Motor/Sensory Function Overall Oral Motor/Sensory Function: Within functional limits   Ice Chips Ice chips: Not tested   Thin Liquid Thin Liquid: Within functional limits Presentation: Straw;Self Fed    Nectar Thick Nectar Thick Liquid: Not tested   Honey Thick Honey Thick Liquid: Not tested   Puree Puree: Within functional limits Presentation: Self Fed;Spoon   Solid   GO   Solid: Within functional limits Presentation: [place] B. Quentin Ore, [facility], [place] Speech Language Pathologist [phone]  Shonna Chock 02/25/2018,11:40 AM

## 2018-02-26 DIAGNOSIS — I10 Essential (primary) hypertension: Secondary | ICD-10-CM

## 2018-02-26 LAB — CBC WITH DIFFERENTIAL/PLATELET
Basophils Absolute: 0.1 10*3/uL (ref 0.0–0.1)
Basophils Relative: 0 %
EOS ABS: 0.3 10*3/uL (ref 0.0–0.7)
EOS PCT: 3 %
HCT: 40.4 % (ref 39.0–52.0)
Hemoglobin: 13 g/dL (ref 13.0–17.0)
LYMPHS ABS: 2 10*3/uL (ref 0.7–4.0)
LYMPHS PCT: 14 %
MCH: 31.3 pg (ref 26.0–34.0)
MCHC: 32.2 g/dL (ref 30.0–36.0)
MCV: 97.3 fL (ref 78.0–100.0)
MONO ABS: 1.2 10*3/uL — AB (ref 0.1–1.0)
Monocytes Relative: 9 %
Neutro Abs: 10.2 10*3/uL — ABNORMAL HIGH (ref 1.7–7.7)
Neutrophils Relative %: 74 %
Platelets: 295 10*3/uL (ref 150–400)
RBC: 4.15 MIL/uL — ABNORMAL LOW (ref 4.22–5.81)
RDW: 14 % (ref 11.5–15.5)
WBC: 13.8 10*3/uL — ABNORMAL HIGH (ref 4.0–10.5)

## 2018-02-26 LAB — BASIC METABOLIC PANEL
Anion gap: 11 (ref 5–15)
BUN: 15 mg/dL (ref 6–20)
CALCIUM: 8.4 mg/dL — AB (ref 8.9–10.3)
CHLORIDE: 94 mmol/L — AB (ref 101–111)
CO2: 29 mmol/L (ref 22–32)
CREATININE: 1.02 mg/dL (ref 0.61–1.24)
GFR calc non Af Amer: >60 mL/min (ref >60)
Glucose, Bld: 115 mg/dL — ABNORMAL HIGH (ref 65–99)
Potassium: 3.7 mmol/L (ref 3.5–5.1)
SODIUM: 134 mmol/L — AB (ref 135–145)

## 2018-02-26 LAB — MAGNESIUM: MAGNESIUM: 1.7 mg/dL (ref 1.7–2.4)

## 2018-02-26 MED ORDER — CEFTRIAXONE SODIUM 1 G IJ SOLR
1.0000 g | INTRAMUSCULAR | Status: DC
  Administered 2018-02-26 – 2018-02-27 (×2): 1 g via INTRAVENOUS
  Filled 2018-02-26 (×2): qty 1

## 2018-02-26 MED ORDER — SODIUM CHLORIDE 0.9 % IV SOLN
1.0000 g | Freq: Three times a day (TID) | INTRAVENOUS | Status: DC
  Filled 2018-02-26: qty 1

## 2018-02-26 NOTE — Progress Notes (Signed)
Pharmacy Antibiotic Note  [name] is a 84 y.o. male admitted on 02/24/2018 with pneumonia.  Pharmacy has been consulted for cefepime and vancomycin dosing.  MD aware PCN allergy = rash, hives wants to proceed.  02/26/2018:   Day 2 abx  Afebrile  Mild leukocytosis- WBC~13 for past 3 days  Renal function improving- CrCl ~47ml/min  UCx growing GNR  Plan: De-escalate Cefepime to Rocephin 1gm IV q24h as discussed with Dr Quincy Simmonds Monitor renal function and cx data  No further no adjustments anticipated- pharmacy to sign off.  Will follow cx data via electronic surveillance software.  Height: 5\' 10"  (177.8 cm) Weight: 205 lb (93 kg) IBW/kg (Calculated) : 73  Temp (24hrs), Avg:98.1 F (36.7 C), Min:97.6 F (36.4 C), Max:98.3 F (36.8 C)  Recent Labs  Lab 02/24/18 1805 02/24/18 1812 02/25/18 0410 02/26/18 0414  WBC 15.9*  --  13.3* 13.8*  CREATININE 1.29*  --  1.13 1.02  LATICACIDVEN  --  1.37  --   --     Estimated Creatinine Clearance: 57.4 mL/min (by C-G formula based on SCr of 1.02 mg/dL).    Allergies  Allergen Reactions  . Neosporin [Neomycin-Bacitracin Zn-Polymyx] Other (See Comments)    Makes his skin red   . Penicillins Hives and Itching  . Penicillin G Rash    Antimicrobials this admission: 6/11 levaquin >> x1 ED 6/11 cefepime >>  6/11 vancomycin >>6/13  Dose adjustments this admission:  Microbiology results: 6/11 BCx: NGTD 6/11 UCx: >100K GNR 6/12 MRSA PCR: negative  Thank you for allowing pharmacy to be a part of this patient's care.  Biagio Borg 02/26/2018 8:32 AM

## 2018-02-26 NOTE — Progress Notes (Signed)
PROGRESS NOTE [facility]   [name]   YIF:[id number] DOB: 07-29-1927  DOA: 02/24/2018 PCP: Gregor Hams, FNP   Brief Narrative:  [name] 84 year old male with medical history significant for NSVT, CAD, hypertension bladder cancer and A. fib not on AC.  Patient was brought to the emergency department via EMS with complaining of poor p.o. intake, weakness, confusion and cough.  Upon ED evaluation was found to have hypotension which improved with IV fluids, elevated WBC at 15.9, elevated creatinine, chest x-ray with basilar consolidation and UA concerning for UTI.  Patient was admitted with working diagnosis of pneumonia and possible UTI.  Patient was started on empiric antibiotic.  Subjective: Patient seen and examined, report feeling somewhat better than yesterday.  Denies chest pain or shortness of breath.  Afebrile.  Assessment & Plan: Community-acquired pneumonia Cough, leukocytosis with chest x-ray showing right basilar consolidation Initially treated with cefepime, Flagyl and vancomycin, MRSA screen is negative will discontinue vancomycin.  Will de-escalate cefepime to ceftriaxone.  Swallow eval with no signs of gross aspiration.  ? silent aspiration, will discontinue Flagyl for now.  Follow-up blood culture no growth so far.  Continue supportive treatment.  Out of bed as tolerated, PT eval ordered  UTI - UA grossly abnormal Urine culture growing E. coli Follow-up sensitivity, patient on ceftriaxone  Acute metabolic encephalopathy - resolved Likely due to infectious process and dehydration Treat underlying causes  AKI - resolved From hypotension and dehydration Treated with IV fluid, monitor renal function in a.m. Monitor for signs of fluid overload, given history of CHF  Chronic systolic CHF, A. fib and ventricular bigeminy Echo 11/2017 shows EF of 35 to 40% with diffuse hypokinesis. Continue medical treatment and monitor Not on any  anticoagulation. Resume Lasix in AM if Cr stable   DVT prophylaxis: Lovenox Code Status: Full code Family Communication: None at bedside Disposition Plan: Likely SNF in AM if remains stable   Consultants:   None   Procedures:   None   Antimicrobials: Anti-infectives (From admission, onward)   Start     Dose/Rate Route Frequency Ordered Stop   02/26/18 1400  ceFEPIme (MAXIPIME) 1 g in sodium chloride 0.9 % 100 mL IVPB  Status:  Discontinued     1 g 200 mL/hr over 30 Minutes Intravenous Every 8 hours 02/26/18 0831 02/26/18 1126   02/26/18 1400  cefTRIAXone (ROCEPHIN) 1 g in sodium chloride 0.9 % 100 mL IVPB     1 g 200 mL/hr over 30 Minutes Intravenous Every 24 hours 02/26/18 1126     02/25/18 0800  vancomycin (VANCOCIN) 1,250 mg in sodium chloride 0.9 % 250 mL IVPB  Status:  Discontinued     1,250 mg 166.7 mL/hr over 90 Minutes Intravenous Every 24 hours 02/24/18 2255 02/26/18 0757   02/25/18 0200  metroNIDAZOLE (FLAGYL) IVPB 500 mg     500 mg 100 mL/hr over 60 Minutes Intravenous Every 8 hours 02/25/18 0104     02/24/18 2300  ceFEPIme (MAXIPIME) 1 g in sodium chloride 0.9 % 100 mL IVPB  Status:  Discontinued     1 g 200 mL/hr over 30 Minutes Intravenous Daily at bedtime 02/24/18 2255 02/26/18 0831   02/24/18 1845  vancomycin (VANCOCIN) IVPB 1000 mg/200 mL premix     1,000 mg 200 mL/hr over 60 Minutes Intravenous  Once 02/24/18 1840 02/24/18 2220   02/24/18 1845  levofloxacin (LEVAQUIN) IVPB 500 mg     500 mg 100 mL/hr over 60 Minutes Intravenous  Once 02/24/18 1840 02/24/18 2023         Objective: Vitals:   02/25/18 1412 02/25/18 2117 02/26/18 0624 02/26/18 1403  BP: 115/71 128/73 98/73 132/80  Pulse: 80 75 98 84  Resp: 14 (!) 22 20 18   Temp: 98.3 F (36.8 C) 97.6 F (36.4 C) 98.3 F (36.8 C) 97.7 F (36.5 C)  TempSrc: Oral Oral Oral Oral  SpO2: 97% 99% 95% 94%  Weight:      Height:        Intake/Output Summary (Last 24 hours) at 02/26/2018 1453 Last  data filed at 02/26/2018 0736 Gross per 24 hour  Intake 300 ml  Output 1500 ml  Net -1200 ml   Filed Weights   02/24/18 2250  Weight: 93 kg (205 lb)    Examination:  General: Pt is alert, awake, not in acute distress Cardiovascular: RRR, S1/S2 +, no rubs, no gallops Respiratory: Air entry improved, no wheezing no crackles Abdominal: Soft, NT, ND, bowel sounds + Extremities: Trace LE edema, no cyanosis    Data Reviewed: I have personally reviewed following labs and imaging studies  CBC: Recent Labs  Lab 02/24/18 1805 02/25/18 0410 02/26/18 0414  WBC 15.9* 13.3* 13.8*  NEUTROABS 12.4*  --  10.2*  HGB 13.2 12.8* 13.0  HCT 39.6 38.5* 40.4  MCV 96.8 97.0 97.3  PLT 312 282 638   Basic Metabolic Panel: Recent Labs  Lab 02/24/18 1805 02/25/18 0410 02/26/18 0414  NA 131* 134* 134*  K 3.6 3.9 3.7  CL 87* 90* 94*  CO2 33* 35* 29  GLUCOSE 126* 97 115*  BUN 18 17 15   CREATININE 1.29* 1.13 1.02  CALCIUM 8.4* 8.4* 8.4*  MG 1.9  --  1.7   GFR: Estimated Creatinine Clearance: 57.4 mL/min (by C-G formula based on SCr of 1.02 mg/dL). Liver Function Tests: Recent Labs  Lab 02/24/18 1805  AST 69*  ALT 86*  ALKPHOS 101  BILITOT 1.0  PROT 5.9*  ALBUMIN 2.4*   No results for input(s): LIPASE, AMYLASE in the last 168 hours. No results for input(s): AMMONIA in the last 168 hours. Coagulation Profile: No results for input(s): INR, PROTIME in the last 168 hours. Cardiac Enzymes: Recent Labs  Lab 02/24/18 1805  TROPONINI 0.12*   BNP (last 3 results) No results for input(s): PROBNP in the last 8760 hours. HbA1C: No results for input(s): HGBA1C in the last 72 hours. CBG: No results for input(s): GLUCAP in the last 168 hours. Lipid Profile: No results for input(s): CHOL, HDL, LDLCALC, TRIG, CHOLHDL, LDLDIRECT in the last 72 hours. Thyroid Function Tests: No results for input(s): TSH, T4TOTAL, FREET4, T3FREE, THYROIDAB in the last 72 hours. Anemia Panel: No results  for input(s): VITAMINB12, FOLATE, FERRITIN, TIBC, IRON, RETICCTPCT in the last 72 hours. Sepsis Labs: Recent Labs  Lab 02/24/18 1812  LATICACIDVEN 1.37    Recent Results (from the past 240 hour(s))  Culture, blood (Routine X 2) w Reflex to ID Panel     Status: None (Preliminary result)   Collection Time: 02/24/18  6:13 PM  Result Value Ref Range Status   Specimen Description   Final    BLOOD RIGHT FOREARM Performed at [facility] [address], [place], [place] [address]    Special Requests   Final    BOTTLES DRAWN AEROBIC AND ANAEROBIC Blood Culture results may not be optimal due to an inadequate volume of blood received in culture bottles Performed at [facility]  [address], [place], [place] [address]    Culture   Final    NO GROWTH 2 DAYS Performed at [facility] [address], [place], [place] [address]    Report Status PENDING  Incomplete  Culture, blood (Routine X 2) w Reflex to ID Panel     Status: None (Preliminary result)   Collection Time: 02/24/18  6:13 PM  Result Value Ref Range Status   Specimen Description   Final    BLOOD RIGHT HAND Performed at [facility] [address], [place], [place] [address]    Special Requests   Final    BOTTLES DRAWN AEROBIC AND ANAEROBIC Blood Culture adequate volume Performed at [facility] [address] [place], [place] [address]    Culture   Final    NO GROWTH 2 DAYS Performed at [facility] [address], [place], [place] [address]    Report Status PENDING  Incomplete  Culture, Urine     Status: Abnormal (Preliminary result)   Collection Time: 02/24/18  6:36 PM  Result Value Ref Range Status   Specimen Description   Final    URINE, CLEAN CATCH Performed at [facility], [place] [address] [place], [place] [address]    Special Requests   Final    NONE Performed at [facility],  [place] [address], [place], [place] [address]    Culture >=100,000 COLONIES/mL ESCHERICHIA COLI (A)  Final   Report Status PENDING  Incomplete  MRSA PCR Screening     Status: None   Collection Time: 02/25/18  6:11 PM  Result Value Ref Range Status   MRSA by PCR NEGATIVE NEGATIVE Final    Comment:        The GeneXpert MRSA Assay (FDA approved for NASAL specimens only), is one component of a comprehensive MRSA colonization surveillance program. It is not intended to diagnose MRSA infection nor to guide or monitor treatment for MRSA infections. Performed at [facility], [place] [address], [place], [place] [address]      Radiology Studies: Dg Chest Port 1 View  Result Date: 02/24/2018 CLINICAL DATA:  Failure to thrive, not eating for 3 days, history hypertension and prostate cancer EXAM: PORTABLE CHEST 1 VIEW COMPARISON:  Portable exam 2536 hours compared to 12/06/2017 FINDINGS: Enlargement of cardiac silhouette. Mediastinal contours and pulmonary vascularity normal. New RIGHT basilar infiltrate question pneumonia versus aspiration. Probable small RIGHT pleural effusion. Upper lungs clear. No pneumothorax. Bones unremarkable. IMPRESSION: New RIGHT basilar consolidation which could represent pneumonia or aspiration. Probable small RIGHT pleural effusion. Enlargement of cardiac silhouette. Electronically Signed   By: Lavonia Dana M.D.   On: 02/24/2018 18:13    Scheduled Meds: . aspirin EC  81 mg Oral Daily  . enoxaparin (LOVENOX) injection  40 mg Subcutaneous Q24H  . fluticasone  1 spray Each Nare Daily  . mouth rinse  15 mL Mouth Rinse BID   Continuous Infusions: . cefTRIAXone (ROCEPHIN)  IV 1 g (02/26/18 1433)  . metronidazole 500 mg (02/26/18 1319)     LOS: 2 days    Time spent: Total of 25 minutes spent with pt, greater than 50% of which was spent in discussion of  treatment, counseling and coordination of care   Chipper Oman, MD Pager: Text Page via  [email or web address]   If 7PM-7AM, please contact night-coverage [email or web address] 02/26/2018, 2:53 PM   Note - This record has been created using [employer]. Chart creation  errors have been sought, but may not always have been located. Such creation errors do not reflect on the standard of medical care.

## 2018-02-26 NOTE — Progress Notes (Signed)
  Speech Language Pathology Treatment: Dysphagia  Patient Details Name: [name] MRN: [id number] DOB: 11-17-1926 Today's Date: 02/26/2018 Time: 4827-0786 SLP Time Calculation (min) (ACUTE ONLY): 10 min  Assessment / Plan / Recommendation Clinical Impression  Per RN, pt has not exhibited difficulty swallowing, and has had improved po intake. Pt neither reports nor demonstrates difficulty swallowing. Completion of MBS reiterated to pt, who indicated he did not think it needed to be done. Recommend consider MBS if lungs do not continue to clear, or if PNA becomes recurrent. This can be completed as an outpatient. Pt indicates he is hoping to be DC'd home tomorrow. Continue to recommend Palliative Care consult to facilitate establishment of goals of care.    HPI HPI: 84 year old male admitted 02/24/18 with failure to thrive, SOB, cough, lethargy. PMH: several months of generalized weakness, confusion, weight loss due to poor po intake. CXR = RLL consoldiation, PNA vs aspiration.      SLP Plan  Continue with current plan of care       Recommendations  Diet recommendations: Regular;Thin liquid Liquids provided via: Cup;Straw Medication Administration: Whole meds with liquid Supervision: Patient able to self feed;Staff to assist with self feeding Compensations: Minimize environmental distractions;Slow rate;Small sips/bites Postural Changes and/or Swallow Maneuvers: Seated upright 90 degrees;Upright 30-60 min after meal                Oral Care Recommendations: Oral care BID Follow up Recommendations: (Consider MBS if lungs do not clear, or PNA becomes recurent) SLP Visit Diagnosis: Dysphagia, unspecified (R13.10) Plan: Continue with current plan of care       GO               [name] B. [name] [facility], CCC-SLP Speech Language Pathologist [phone]  Shonna Chock 02/26/2018, 12:17 PM

## 2018-02-27 LAB — BASIC METABOLIC PANEL
Anion gap: 10 (ref 5–15)
BUN: 14 mg/dL (ref 6–20)
CO2: 31 mmol/L (ref 22–32)
Calcium: 8.5 mg/dL — ABNORMAL LOW (ref 8.9–10.3)
Chloride: 95 mmol/L — ABNORMAL LOW (ref 101–111)
Creatinine, Ser: 0.98 mg/dL (ref 0.61–1.24)
Glucose, Bld: 95 mg/dL (ref 65–99)
POTASSIUM: 3.4 mmol/L — AB (ref 3.5–5.1)
SODIUM: 136 mmol/L (ref 135–145)

## 2018-02-27 LAB — CBC WITH DIFFERENTIAL/PLATELET
BASOS ABS: 0 10*3/uL (ref 0.0–0.1)
Basophils Relative: 0 %
EOS PCT: 3 %
Eosinophils Absolute: 0.3 10*3/uL (ref 0.0–0.7)
HEMATOCRIT: 41.5 % (ref 39.0–52.0)
HEMOGLOBIN: 13.6 g/dL (ref 13.0–17.0)
LYMPHS PCT: 16 %
Lymphs Abs: 1.8 10*3/uL (ref 0.7–4.0)
MCH: 31.8 pg (ref 26.0–34.0)
MCHC: 32.8 g/dL (ref 30.0–36.0)
MCV: 97 fL (ref 78.0–100.0)
Monocytes Absolute: 1.1 10*3/uL — ABNORMAL HIGH (ref 0.1–1.0)
Monocytes Relative: 9 %
NEUTROS ABS: 8.5 10*3/uL — AB (ref 1.7–7.7)
NEUTROS PCT: 72 %
PLATELETS: 314 10*3/uL (ref 150–400)
RBC: 4.28 MIL/uL (ref 4.22–5.81)
RDW: 14.3 % (ref 11.5–15.5)
WBC: 11.8 10*3/uL — AB (ref 4.0–10.5)

## 2018-02-27 LAB — URINE CULTURE: Culture: >=100000 — AB

## 2018-02-27 LAB — MAGNESIUM: Magnesium: 1.8 mg/dL (ref 1.7–2.4)

## 2018-02-27 MED ORDER — CEFDINIR 300 MG PO CAPS
300.0000 mg | ORAL_CAPSULE | Freq: Two times a day (BID) | ORAL | Status: DC
  Administered 2018-02-27 – 2018-02-28 (×2): 300 mg via ORAL
  Filled 2018-02-27 (×2): qty 1

## 2018-02-27 MED ORDER — POTASSIUM CHLORIDE CRYS ER 20 MEQ PO TBCR
40.0000 meq | EXTENDED_RELEASE_TABLET | ORAL | Status: AC
Start: 2018-02-27 — End: 2018-02-27
  Administered 2018-02-27 (×2): 40 meq via ORAL
  Filled 2018-02-27 (×2): qty 2

## 2018-02-27 MED ORDER — FUROSEMIDE 20 MG PO TABS
20.0000 mg | ORAL_TABLET | Freq: Every day | ORAL | Status: DC
  Administered 2018-02-27 – 2018-02-28 (×2): 20 mg via ORAL
  Filled 2018-02-27 (×2): qty 1

## 2018-02-27 NOTE — Care Management Important Message (Signed)
Important Message  Patient Details  Name: [name] MRN: [id number] Date of Birth: December 02, 1926   Medicare Important Message Given:  Yes    Kerin Salen 02/27/2018, 11:42 AMImportant Message  Patient Details  Name: [name] MRN: [id number] Date of Birth: [birth year]   Medicare Important Message Given:  Yes    Kerin Salen 02/27/2018, 11:41 AM

## 2018-02-27 NOTE — Clinical Social Work Note (Signed)
Clinical Social Work Assessment  Patient Details  Name: [name] MRN: [id number] Date of Birth: 1926-12-08  Date of referral:  02/27/18               Reason for consult:  Facility Placement                Permission sought to share information with:  Facility Sport and exercise psychologist, Family Supports Permission granted to share information::  Yes, Verbal Permission Granted  Name::     [name]   Agency::     Relationship::  wife  Contact Information:  [phone]  Housing/Transportation Living arrangements for the past 2 months:  [place] of Information:  Patient, Spouse Patient Interpreter Needed:  None Criminal Activity/Legal Involvement Pertinent to Current Situation/Hospitalization:  No - Comment as needed Significant Relationships:  Spouse Lives with:  Spouse Do you feel safe going back to the place where you live?  (PT recommending SNF) Need for family participation in patient care:  Yes (Comment)  Care giving concerns:  Patient from home with spouse. Patient's wife reported that patient uses a walker and requires assistance with ADLs. Patient's wife reported that she is only person that assists patient in the home and that it's "not been an easy thing". PT recommending SNF.    Social Worker assessment / plan:  CSW spoke with patient at bedside regarding discharge planning and PT recommendation for SNF. Patient asked CSW to contact his wife to discuss discharge planning and that he is agreeable to whatever she says. CSW received call from patient's wife [name] after leaving patient's room. Patient's wife reported that she wants patient to dc to SNF. CSW explained SNF placement process and insurance authorization, patient's wife verbalized understanding. Patient's wife reported that she may want patient to go to [facility]. CSW agreed to complete patient's FL2 and follow up with [facility].   CSW contacted [facility] to inquire about ability to offer  patient a bed, staff reported that they were at capacity.   CSW completed patient's FL2 and will follow up with bed offers.   CSW will continue to follow and assist with discharge planning.  Employment status:  Retired Nurse, adult PT Recommendations:  [place], [place] / Referral to community resources:  [place]  Patient/Family's Response to care:  Patient's wife appreciative of CSW assistance with discharge planning.  Patient/Family's Understanding of and Emotional Response to Diagnosis, Current Treatment, and Prognosis:  Patient presented calm and deferred to wife to discuss discharge planning. Patient's wife involved in patient's care and verbalized understanding of current treatment plan. Patient's wife verbalized plan for patient to dc to SNF for ST rehab.   Emotional Assessment Appearance:  Appears stated age Attitude/Demeanor/Rapport:  Other(cooperative) Affect (typically observed):  Calm Orientation:  Oriented to Self, Oriented to Place, Oriented to Situation Alcohol / Substance use:  Not Applicable Psych involvement (Current and /or in the community):  No (Comment)  Discharge Needs  Concerns to be addressed:  Care Coordination Readmission within the last 30 days:  No Current discharge risk:  Physical Impairment, Lack of support system Barriers to Discharge:  [place], LCSW 02/27/2018, 1:58 PM

## 2018-02-27 NOTE — Progress Notes (Signed)
PROGRESS NOTE [facility]   [name]   LTJ:[id number] DOB: 1927-04-21  DOA: 02/24/2018 PCP: Gregor Hams, FNP   Brief Narrative:  [name] 84 year old male with medical history significant for NSVT, CAD, hypertension bladder cancer and A. fib not on AC.  Patient was brought to the emergency department via EMS with complaining of poor p.o. intake, weakness, confusion and cough.  Upon ED evaluation was found to have hypotension which improved with IV fluids, elevated WBC at 15.9, elevated creatinine, chest x-ray with basilar consolidation and UA concerning for UTI.  Patient was admitted with working diagnosis of pneumonia and possible UTI.  Patient was started on empiric antibiotic.  Subjective: Patient seen and examined, continues to improve.  Unable to wean off oxygen.  BP slight soft today.  Assessment & Plan:  Community-acquired pneumonia Cough, leukocytosis with chest x-ray showing right basilar consolidation.   Initially treated with cefepime, Flagyl and vancomycin, MRSA screen is negative will discontinue vancomycin. Then switched to ceftriaxone.  Patient also with UTI will switch to [facility] to complete antibiotic therapy for 10 days.  Swallow eval with no signs of gross aspiration.  ? silent aspiration.  Follow-up blood culture no growth so far.  Continue supportive treatment.  Out of bed as tolerated, PT recommending SNF.  UTI - E Coli Urine culture growing E. Coli sensitive rocephin, will switch to omnicef given PNA   Acute metabolic encephalopathy - resolved Likely due to infectious process and dehydration Treat underlying causes  AKI - resolved From hypotension and dehydration Treated with IV fluid, monitor renal function in a.m. Monitor for signs of fluid overload, given history of CHF  Chronic systolic CHF, A. fib and ventricular bigeminy Echo 11/2017 shows EF of 35 to 40% with diffuse hypokinesis. Continue medical treatment and monitor Not on  any anticoagulation. Will resume home dose Lasix  Manual BP 110/60  DVT prophylaxis: Lovenox Code Status: Full code Family Communication: None at bedside Disposition Plan: SNF when bed available   Consultants:   None   Procedures:   None   Antimicrobials: Anti-infectives (From admission, onward)   Start     Dose/Rate Route Frequency Ordered Stop   02/26/18 1400  ceFEPIme (MAXIPIME) 1 g in sodium chloride 0.9 % 100 mL IVPB  Status:  Discontinued     1 g 200 mL/hr over 30 Minutes Intravenous Every 8 hours 02/26/18 0831 02/26/18 1126   02/26/18 1400  cefTRIAXone (ROCEPHIN) 1 g in sodium chloride 0.9 % 100 mL IVPB     1 g 200 mL/hr over 30 Minutes Intravenous Every 24 hours 02/26/18 1126     02/25/18 0800  vancomycin (VANCOCIN) 1,250 mg in sodium chloride 0.9 % 250 mL IVPB  Status:  Discontinued     1,250 mg 166.7 mL/hr over 90 Minutes Intravenous Every 24 hours 02/24/18 2255 02/26/18 0757   02/25/18 0200  metroNIDAZOLE (FLAGYL) IVPB 500 mg  Status:  Discontinued     500 mg 100 mL/hr over 60 Minutes Intravenous Every 8 hours 02/25/18 0104 02/26/18 1458   02/24/18 2300  ceFEPIme (MAXIPIME) 1 g in sodium chloride 0.9 % 100 mL IVPB  Status:  Discontinued     1 g 200 mL/hr over 30 Minutes Intravenous Daily at bedtime 02/24/18 2255 02/26/18 0831   02/24/18 1845  vancomycin (VANCOCIN) IVPB 1000 mg/200 mL premix     1,000 mg 200 mL/hr over 60 Minutes Intravenous  Once 02/24/18 1840 02/24/18 2220   02/24/18 1845  levofloxacin (LEVAQUIN)  IVPB 500 mg     500 mg 100 mL/hr over 60 Minutes Intravenous  Once 02/24/18 1840 02/24/18 2023         Objective: Vitals:   02/27/18 0426 02/27/18 1148 02/27/18 1150 02/27/18 1317  BP: 97/77   (!) 88/59  Pulse: 94 98 94 71  Resp: 20   16  Temp: 97.9 F (36.6 C)   98.7 F (37.1 C)  TempSrc: Oral   Oral  SpO2: 90% (!) 85% 93% 96%  Weight:      Height:        Intake/Output Summary (Last 24 hours) at 02/27/2018 1543 Last data filed at  02/27/2018 1053 Gross per 24 hour  Intake 100 ml  Output 1600 ml  Net -1500 ml   Filed Weights   02/24/18 2250  Weight: 93 kg (205 lb)    Examination:  General: NAD  Cardiovascular: RRR, S1/S2 +, no rubs, no gallops Respiratory: Decrease BS B/L, no crackles or wheezing, improved from yesterday  Abdominal: Soft, NT, ND, bowel sounds + Extremities: Trace edema B/l cyanosis    Data Reviewed: I have personally reviewed following labs and imaging studies  CBC: Recent Labs  Lab 02/24/18 1805 02/25/18 0410 02/26/18 0414 02/27/18 0422  WBC 15.9* 13.3* 13.8* 11.8*  NEUTROABS 12.4*  --  10.2* 8.5*  HGB 13.2 12.8* 13.0 13.6  HCT 39.6 38.5* 40.4 41.5  MCV 96.8 97.0 97.3 97.0  PLT 312 282 295 509   Basic Metabolic Panel: Recent Labs  Lab 02/24/18 1805 02/25/18 0410 02/26/18 0414 02/27/18 0422  NA 131* 134* 134* 136  K 3.6 3.9 3.7 3.4*  CL 87* 90* 94* 95*  CO2 33* 35* 29 31  GLUCOSE 126* 97 115* 95  BUN 18 17 15 14   CREATININE 1.29* 1.13 1.02 0.98  CALCIUM 8.4* 8.4* 8.4* 8.5*  MG 1.9  --  1.7 1.8   GFR: Estimated Creatinine Clearance: 59.7 mL/min (by C-G formula based on SCr of 0.98 mg/dL). Liver Function Tests: Recent Labs  Lab 02/24/18 1805  AST 69*  ALT 86*  ALKPHOS 101  BILITOT 1.0  PROT 5.9*  ALBUMIN 2.4*   No results for input(s): LIPASE, AMYLASE in the last 168 hours. No results for input(s): AMMONIA in the last 168 hours. Coagulation Profile: No results for input(s): INR, PROTIME in the last 168 hours. Cardiac Enzymes: Recent Labs  Lab 02/24/18 1805  TROPONINI 0.12*   BNP (last 3 results) No results for input(s): PROBNP in the last 8760 hours. HbA1C: No results for input(s): HGBA1C in the last 72 hours. CBG: No results for input(s): GLUCAP in the last 168 hours. Lipid Profile: No results for input(s): CHOL, HDL, LDLCALC, TRIG, CHOLHDL, LDLDIRECT in the last 72 hours. Thyroid Function Tests: No results for input(s): TSH, T4TOTAL, FREET4,  T3FREE, THYROIDAB in the last 72 hours. Anemia Panel: No results for input(s): VITAMINB12, FOLATE, FERRITIN, TIBC, IRON, RETICCTPCT in the last 72 hours. Sepsis Labs: Recent Labs  Lab 02/24/18 1812  LATICACIDVEN 1.37    Recent Results (from the past 240 hour(s))  Culture, blood (Routine X 2) w Reflex to ID Panel     Status: None (Preliminary result)   Collection Time: 02/24/18  6:13 PM  Result Value Ref Range Status   Specimen Description   Final    BLOOD RIGHT FOREARM Performed at [facility] [address], [place], [place] [address]    Special Requests   Final    BOTTLES DRAWN AEROBIC AND  ANAEROBIC Blood Culture results may not be optimal due to an inadequate volume of blood received in culture bottles Performed at [facility], [place] [address] [place], [place] [address]    Culture   Final    NO GROWTH 3 DAYS Performed at [facility], [place] [address], [place], [place] [address]    Report Status PENDING  Incomplete  Culture, blood (Routine X 2) w Reflex to ID Panel     Status: None (Preliminary result)   Collection Time: 02/24/18  6:13 PM  Result Value Ref Range Status   Specimen Description   Final    BLOOD RIGHT HAND Performed at [facility] [address] [place], [place] [address]    Special Requests   Final    BOTTLES DRAWN AEROBIC AND ANAEROBIC Blood Culture adequate volume Performed at [facility] [address], [place], [place] [address]    Culture   Final    NO GROWTH 3 DAYS Performed at [facility], [place] [address], [place], [place] [address]    Report Status PENDING  Incomplete  Culture, Urine     Status: Abnormal   Collection Time: 02/24/18  6:36 PM  Result Value Ref Range Status   Specimen Description   Final    URINE, CLEAN CATCH Performed at [facility], [place] [address], [place], [place] [address]    Special Requests   Final     NONE Performed at [facility], [place] [address], [place], [place] [address]    Culture >=100,000 COLONIES/mL ESCHERICHIA COLI (A)  Final   Report Status 02/27/2018 FINAL  Final   Organism ID, Bacteria ESCHERICHIA COLI (A)  Final      Susceptibility   Escherichia coli - MIC*    AMPICILLIN >=32 RESISTANT Resistant     CEFAZOLIN <=4 SENSITIVE Sensitive     CEFTRIAXONE <=1 SENSITIVE Sensitive     CIPROFLOXACIN <=0.25 SENSITIVE Sensitive     GENTAMICIN <=1 SENSITIVE Sensitive     IMIPENEM <=0.25 SENSITIVE Sensitive     NITROFURANTOIN <=16 SENSITIVE Sensitive     TRIMETH/SULFA <=20 SENSITIVE Sensitive     AMPICILLIN/SULBACTAM 16 INTERMEDIATE Intermediate     PIP/TAZO <=4 SENSITIVE Sensitive     Extended ESBL NEGATIVE Sensitive     * >=100,000 COLONIES/mL ESCHERICHIA COLI  MRSA PCR Screening     Status: None   Collection Time: 02/25/18  6:11 PM  Result Value Ref Range Status   MRSA by PCR NEGATIVE NEGATIVE Final    Comment:        The GeneXpert MRSA Assay (FDA approved for NASAL specimens only), is one component of a comprehensive MRSA colonization surveillance program. It is not intended to diagnose MRSA infection nor to guide or monitor treatment for MRSA infections. Performed at [facility], [place] [address], [place], [place] [address]      Radiology Studies: No results found.  Scheduled Meds: . aspirin EC  81 mg Oral Daily  . enoxaparin (LOVENOX) injection  40 mg Subcutaneous Q24H  . fluticasone  1 spray Each Nare Daily  . mouth rinse  15 mL Mouth Rinse BID   Continuous Infusions: . cefTRIAXone (ROCEPHIN)  IV 1 g (02/27/18 1543)     LOS: 3 days    Time spent: Total of 25 minutes spent with pt, greater than 50% of which was spent in discussion of  treatment, counseling and coordination of care  Chipper Oman, MD Pager: Text Page via [email or web address]   If 7PM-7AM, please contact night-coverage [email or web address] 02/27/2018, 3:43 PM    Note - This record has been created using [employer]. Chart creation errors have been sought, but may not always have been located. Such creation errors do not reflect on the standard of medical care.

## 2018-02-27 NOTE — Evaluation (Signed)
Physical Therapy Evaluation Patient Details Name: [name] MRN: [id number] DOB: 01/28/1927 Today's Date: 02/27/2018   History of Present Illness  84 yo male admitted with Pna, UTI, hypotension. Hx of CAD, SVT, Afib, bladder ca, prostate ca, CKD, TURBT 2013  Clinical Impression  On eval, pt required Min assist for mobility. He walked ~15 feet x 2 with a RW. LOB x 2 requiring external assistance from therapist to prevent fall. O2 sat 97% on RA, dyspnea 2/4 during session. Pt presents with general weakness, decreased activity tolerance, and impaired gait and balance. Recommend ST rehab at SNF, if pt is agreeable. No family present during session. Will follow and progress activity as tolerated. If pt chooses to return home, recommend HHPT and 24 hour supervision/assist.     Follow Up Recommendations SNF;Supervision/Assistance - 24 hour    Equipment Recommendations  None recommended by PT    Recommendations for Other Services       Precautions / Restrictions Precautions Precautions: Fall Restrictions Weight Bearing Restrictions: No      Mobility  Bed Mobility Overal bed mobility: Needs Assistance Bed Mobility: Supine to Sit;Sit to Supine     Supine to sit: Min guard;HOB elevated Sit to supine: Min guard;HOB elevated   General bed mobility comments: Increased time. close guard for safety.   Transfers Overall transfer level: Needs assistance Equipment used: Rolling walker (2 wheeled) Transfers: Sit to/from Stand Sit to Stand: Min assist;From elevated surface         General transfer comment: x2 (once from elevated bed, once from low toilet) Assist to rises, stabilize, control descent. VCs safety, technique, hand placement. Increased time. Unsteady.   Ambulation/Gait Ambulation/Gait assistance: Min assist Gait Distance (Feet): 15 Feet(x2) Assistive device: Rolling walker (2 wheeled) Gait Pattern/deviations: Step-to pattern;Step-through pattern;Decreased stride  length     General Gait Details: Unsteady. LOB x 2 requiring external assist to prevent fall. Pt walked to and from bathroom with a RW.   Stairs            Wheelchair Mobility    Modified Rankin (Stroke Patients Only)       Balance Overall balance assessment: Needs assistance         Standing balance support: Bilateral upper extremity supported Standing balance-Leahy Scale: Poor                               Pertinent Vitals/Pain      Home Living Family/patient expects to be discharged to:: Unsure Living Arrangements: Spouse/significant other Available Help at Discharge: Family Type of Home: House Home Access: Stairs to enter Entrance Stairs-Rails: None Entrance Stairs-Number of Steps: 2 Home Layout: One level Home Equipment: Environmental consultant - 2 wheels      Prior Function Level of Independence: Independent with assistive device(s)         Comments: uses RW     Hand Dominance        Extremity/Trunk Assessment   Upper Extremity Assessment Upper Extremity Assessment: Generalized weakness    Lower Extremity Assessment Lower Extremity Assessment: Generalized weakness    Cervical / Trunk Assessment Cervical / Trunk Assessment: Kyphotic  Communication      Cognition Arousal/Alertness: Awake/alert Behavior During Therapy: WFL for tasks assessed/performed Overall Cognitive Status: Within Functional Limits for tasks assessed  General Comments: pt is very [facility]      General Comments      Exercises     Assessment/Plan    PT Assessment Patient needs continued PT services  PT Problem List Decreased strength;Decreased activity tolerance;Decreased mobility;Decreased balance;Decreased knowledge of use of DME       PT Treatment Interventions DME instruction;Gait training;Functional mobility training;Therapeutic activities;Balance training;Patient/family education;Therapeutic exercise    PT Goals  (Current goals can be found in the Care Plan section)  Acute Rehab PT Goals Patient Stated Goal: none stated PT Goal Formulation: With patient Time For Goal Achievement: 03/13/18 Potential to Achieve Goals: Good    Frequency Min 3X/week   Barriers to discharge        Co-evaluation               AM-PAC PT "6 Clicks" Daily Activity  Outcome Measure Difficulty turning over in bed (including adjusting bedclothes, sheets and blankets)?: None Difficulty moving from lying on back to sitting on the side of the bed? : A Little Difficulty sitting down on and standing up from a chair with arms (e.g., wheelchair, bedside commode, etc,.)?: Unable Help needed moving to and from a bed to chair (including a wheelchair)?: A Little Help needed walking in hospital room?: A Little Help needed climbing 3-5 steps with a railing? : A Lot 6 Click Score: 16    End of Session Equipment Utilized During Treatment: Gait belt Activity Tolerance: Patient tolerated treatment well Patient left: in bed;with call bell/phone within reach;with bed alarm set   PT Visit Diagnosis: Muscle weakness (generalized) (M62.81);Difficulty in walking, not elsewhere classified (R26.2)    Time: 7262-0355 PT Time Calculation (min) (ACUTE ONLY): 28 min   Charges:   PT Evaluation $PT Eval Moderate Complexity: 1 Mod PT Treatments $Gait Training: 8-22 mins   PT G Codes:          Weston Anna, MPT Pager: [phone]

## 2018-02-27 NOTE — Care Management Note (Signed)
Case Management Note  Patient Details  Name: [name] MRN: [id number] Date of Birth: [birth year]  Subjective/Objective:   Pt admitted with PNA                 Action/Plan: Please to discharge to SNF   Expected Discharge Date:  (unknown)               Expected Discharge Plan:  Skilled Nursing Facility  In-House Referral:  Clinical Social Work  Discharge planning Services  CM Consult  Post Acute Care Choice:    Choice offered to:     DME Arranged:    DME Agency:     HH Arranged:  RN, PT [place] Agency:  [place]  Status of Service:  In process, will continue to follow  If discussed at Long Length of Stay Meetings, dates discussed:    Additional CommentsPurcell Mouton, RN 02/27/2018, 11:05 AM

## 2018-02-27 NOTE — NC FL2 (Signed)
[place] LEVEL OF CARE SCREENING TOOL     IDENTIFICATION  Patient Name: [name] Birthdate: [birth year] Sex: male Admission Date (Current Location): 02/24/2018  [facility] and [place] Number:  Herbalist and Address:  [facility],  [place], [place]      Provider Number: [id number]  Attending Physician Name and Address:  Patrecia Pour, Christean Grief, MD  Relative Name and Phone Number:       Current Level of Care: Hospital Recommended Level of Care: [place] Prior Approval Number:    Date Approved/Denied:   PASRR Number: [id number]  Discharge Plan: SNF    Current Diagnoses: Patient Active Problem List   Diagnosis Date Noted  . Pneumonia 02/24/2018  . Pleural effusion on right 12/05/2017  . Acute respiratory failure with hypoxia ([place]) 12/05/2017  . Memory difficulty 05/26/2017  . Gait abnormality 05/26/2017  . Constipation 01/03/2017  . Atrial fibrillation ([place]) 01/03/2017  . Hyponatremia 01/03/2017  . First degree AV block 06/28/2013  . Bladder cancer ([place]) 07/01/2012  . Cataracts, bilateral 03/14/2011  . Ventricular bigeminy   . Nonsustained ventricular tachycardia ([place])   . Nonobstructive CAD (coronary artery disease)   . Hypertension   . Hyperlipidemia     Orientation RESPIRATION BLADDER Height & Weight     Self  O2 Indwelling catheter Weight: 205 lb (93 kg) Height:  5\' 10"  (177.8 cm)  BEHAVIORAL SYMPTOMS/MOOD NEUROLOGICAL BOWEL NUTRITION STATUS        Diet(see dc summary)  AMBULATORY STATUS COMMUNICATION OF NEEDS Skin   Limited Assist Verbally Normal                       Personal Care Assistance Level of Assistance  Bathing, Feeding, Dressing Bathing Assistance: Maximum assistance Feeding assistance: Independent Dressing Assistance: Maximum assistance     Functional Limitations Info  Sight, Hearing, Speech Sight Info: Adequate Hearing Info: Impaired Speech Info: Adequate     SPECIAL CARE FACTORS FREQUENCY  PT (By licensed PT), OT (By licensed OT)     PT Frequency: 5x/week OT Frequency: 5x/week            Contractures      Additional Factors Info  Code Status, Allergies Code Status Info: Full Code Allergies Info: Neosporin Neomycin-bacitracin Zn-polymyx;Penicillins;Penicillin G           Current Medications (02/27/2018):  This is the current hospital active medication list Current Facility-Administered Medications  Medication Dose Route Frequency Provider Last Rate Last Dose  . acetaminophen (TYLENOL) tablet 650 mg  650 mg Oral Q6H PRN Patrecia Pour, Christean Grief, MD   650 mg at 02/25/18 1303  . aspirin EC tablet 81 mg  81 mg Oral Daily Emokpae, Ejiroghene E, MD   81 mg at 02/27/18 0946  . cefTRIAXone (ROCEPHIN) 1 g in sodium chloride 0.9 % 100 mL IVPB  1 g Intravenous Q24H Patrecia Pour, Christean Grief, MD   Stopped at 02/26/18 1503  . enoxaparin (LOVENOX) injection 40 mg  40 mg Subcutaneous Q24H Emokpae, Ejiroghene E, MD   40 mg at 02/27/18 0946  . fluticasone (FLONASE) 50 MCG/ACT nasal spray 1 spray  1 spray Each Nare Daily Emokpae, Ejiroghene E, MD   1 spray at 02/26/18 0936  . MEDLINE mouth rinse  15 mL Mouth Rinse BID Patrecia Pour, Christean Grief, MD   15 mL at 02/26/18 2200  . ondansetron (ZOFRAN) tablet 4 mg  4 mg Oral Q6H PRN Emokpae, Leanne Chang, MD  Or  . ondansetron (ZOFRAN) injection 4 mg  4 mg Intravenous Q6H PRN Emokpae, Ejiroghene E, MD         Discharge Medications: Please see discharge summary for a list of discharge medications.  Relevant Imaging Results:  Relevant Lab Results:   Additional Information SSN  [id number]  Burnis Medin, LCSW

## 2018-02-27 NOTE — Clinical Social Work Placement (Addendum)
Patient received and accepted bed offer at [facility]. Insurance authorization received with a start date of 02/28/18, insurance details provided to [facility].  CLINICAL SOCIAL WORK PLACEMENT  NOTE  Date:  02/27/2018  Patient Details  Name: [name] MRN: [id number] Date of Birth: 07/03/1927  Clinical Social Work is seeking post-discharge placement for this patient at the [place] level of care (*CSW will initial, date and re-position this form in  chart as items are completed):  Yes   Patient/family provided with [place] Work Department's list of facilities offering this level of care within the geographic area requested by the patient (or if unable, by the patient's family).  Yes   Patient/family informed of their freedom to choose among providers that offer the needed level of care, that participate in Medicare, Medicaid or managed care program needed by the patient, have an available bed and are willing to accept the patient.  Yes   Patient/family informed of [facility]'s ownership interest in [facility], as well as of the fact that they are under no obligation to receive care at these facilities.  PASRR submitted to EDS on       PASRR number received on       Existing PASRR number confirmed on 02/27/18     FL2 transmitted to all facilities in geographic area requested by pt/family on 02/27/18     FL2 transmitted to all facilities within larger geographic area on       Patient informed that his/her managed care company has contracts with or will negotiate with certain facilities, including the following:        Yes   Patient/family informed of bed offers received.  Patient chooses bed at [facility]     Physician recommends and patient chooses bed at      Patient to be transferred to  [facility] on  02-28-18.  Patient to be transferred to facility by  [facility]     Patient family notified on  02-28-18 of transfer.  Name of family member notified:   Wife Pamala Hurry [phone]     PHYSICIAN       Additional Comment:    _______________________________________________ Burnis Medin, LCSW 02/27/2018, 5:06 PM Updated Jones Broom. [place], MSW, [place]  02/28/2018 10:51 AM

## 2018-02-28 DIAGNOSIS — B9620 Unspecified Escherichia coli [E. coli] as the cause of diseases classified elsewhere: Secondary | ICD-10-CM

## 2018-02-28 MED ORDER — FUROSEMIDE 20 MG PO TABS: 20.0000 mg | ORAL_TABLET | Freq: Every day | ORAL | Status: DC

## 2018-02-28 MED ORDER — CEFDINIR 300 MG PO CAPS: 300.0000 mg | ORAL_CAPSULE | Freq: Two times a day (BID) | ORAL | 0 refills | Status: AC

## 2018-02-28 NOTE — Clinical Social Work Note (Signed)
Patient to be d/c'ed today to [facility] room 511P.  Patient and family agreeable to plans will transport via ems RN to call report [phone].  CSW spoke to patient's wife Pamala Hurry, [phone] and updated her that patient is discharging today.  Evette Cristal, MSW, [place]

## 2018-02-28 NOTE — Discharge Summary (Signed)
Physician Discharge Summary  [name]  VOZ:[id number]  DOB: 05-14-1927  DOA: 02/24/2018 PCP: Gregor Hams, FNP  Admit date: 02/24/2018 Discharge date: 02/28/2018  Admitted From: Home  Disposition:  SNF   Recommendations for Outpatient Follow-up:  1. Follow up with SNF provider at earliest convenience  2. Please obtain BMP/CBC in one week to monitor Hgb and renal function  3. Repeat chest x-ray in 4 to 6 weeks to assure resolution of x-ray findings 4. Follow-up final blood culture results, so far negative  Equipment/Devices: Oxygen 1 L nasal cannula  Discharge Condition: Stable CODE STATUS: Full code Diet recommendation: Heart Healthy   Brief/Interim Summary: For full details see H&P/Progress note, but in brief, [name] is a 84 year old male with medical history significant for NSVT, CAD, hypertension bladder cancer and A. fib not on AC.  Patient was brought to the emergency department via EMS with complaining of poor p.o. intake, weakness, confusion and cough.  Upon ED evaluation was found to have hypotension which improved with IV fluids, elevated WBC at 15.9, elevated creatinine, chest x-ray with basilar consolidation and UA concerning for UTI.  Patient was admitted with working diagnosis of pneumonia and possible UTI.    Subjective: Patient seen and examined, has no complaints this morning.  Denies cough and chest pain.  Doing well.  Remains afebrile.  Ambulated with PT yesterday.  No acute events overnight  Discharge Diagnoses/Hospital Course:  Community-acquired pneumonia Cough, leukocytosis with chest x-ray showing right basilar consolidation.   Initially treated with cefepime, Flagyl and vancomycin, MRSA screen is negative, therefore vancomycin was discontinued.   Subsequently was switched to ceftriaxone alone.  Patient also with UTI and leaning count improving therefore he was switched to oral Omnicef to complete antibiotic therapy for 10 days. Swallow eval  with no signs of gross aspiration.  ? silent aspiration.  Follow-up blood culture no growth so far.  Continue supportive treatment.  Ambulate, PT recommending SNF  UTI - E Coli Urine culture growing E. Coli sensitive rocephin, being discharged on Omnicef tolerating well cephalosporins   Acute metabolic encephalopathy - resolved Likely due to infectious process and dehydration Treated underlying causes  AKI - resolved From hypotension and dehydration Treated with IV fluid.  Check renal function in 1 week  Chronic systolic CHF, A. fib and ventricular bigeminy Echo 11/2017 shows EF of 35 to 40% with diffuse hypokinesis. Not on any anticoagulation. Continue home Lasix with no changes   All other chronic medical condition were stable during the hospitalization.  Patient was seen by physical therapy, recommending SNF for SRT On the day of the discharge the patient's vitals were stable, and no other acute medical condition were reported by patient. the patient was felt safe to be discharge to SNF  Discharge Instructions  You were cared for by a hospitalist during your hospital stay. If you have any questions about your discharge medications or the care you received while you were in the hospital after you are discharged, you can call the unit and asked to speak with the hospitalist on call if the hospitalist that took care of you is not available. Once you are discharged, your primary care physician will handle any further medical issues. Please note that NO REFILLS for any discharge medications will be authorized once you are discharged, as it is imperative that you return to your primary care physician (or establish a relationship with a primary care physician if you do not have one) for your aftercare needs  so that they can reassess your need for medications and monitor your lab values.  Discharge Instructions    Call MD for:  difficulty breathing, headache or visual disturbances    Complete by:  As directed    Call MD for:  extreme fatigue   Complete by:  As directed    Call MD for:  hives   Complete by:  As directed    Call MD for:  persistant dizziness or light-headedness   Complete by:  As directed    Call MD for:  persistant nausea and vomiting   Complete by:  As directed    Call MD for:  redness, tenderness, or signs of infection (pain, swelling, redness, odor or green/yellow discharge around incision site)   Complete by:  As directed    Call MD for:  severe uncontrolled pain   Complete by:  As directed    Call MD for:  temperature >100.4   Complete by:  As directed    Diet - low sodium heart healthy   Complete by:  As directed    Increase activity slowly   Complete by:  As directed      Allergies as of 02/28/2018      Reactions   Neosporin [neomycin-bacitracin Zn-polymyx] Other (See Comments)   Makes his skin red   Penicillins Hives, Itching   Penicillin G Rash      Medication List    STOP taking these medications   losartan 25 MG tablet Commonly known as:  COZAAR     TAKE these medications   aspirin EC 81 MG tablet Take 81 mg by mouth daily.   atorvastatin 20 MG tablet Commonly known as:  LIPITOR Take 20 mg by mouth every evening.   cefdinir 300 MG capsule Commonly known as:  OMNICEF Take 1 capsule (300 mg total) by mouth every 12 (twelve) hours for 5 days.   furosemide 20 MG tablet Commonly known as:  LASIX Take 1 tablet (20 mg total) by mouth daily.   prednisoLONE acetate 1 % ophthalmic suspension Commonly known as:  PRED FORTE Place 2 drops into both eyes 2 (two) times daily as needed (redness).      Follow-up Information    Gregor Hams, FNP. Schedule an appointment as soon as possible for a visit in 1 week(s).   Specialty:  Family Medicine Why:  Hospital follow up  Contact information: [place] [phone]          Allergies  Allergen Reactions  . Neosporin  [Neomycin-Bacitracin Zn-Polymyx] Other (See Comments)    Makes his skin red   . Penicillins Hives and Itching  . Penicillin G Rash    Consultations:  None    Procedures/Studies: Dg Chest Port 1 View  Result Date: 02/24/2018 CLINICAL DATA:  Failure to thrive, not eating for 3 days, history hypertension and prostate cancer EXAM: PORTABLE CHEST 1 VIEW COMPARISON:  Portable exam 2440 hours compared to 12/06/2017 FINDINGS: Enlargement of cardiac silhouette. Mediastinal contours and pulmonary vascularity normal. New RIGHT basilar infiltrate question pneumonia versus aspiration. Probable small RIGHT pleural effusion. Upper lungs clear. No pneumothorax. Bones unremarkable. IMPRESSION: New RIGHT basilar consolidation which could represent pneumonia or aspiration. Probable small RIGHT pleural effusion. Enlargement of cardiac silhouette. Electronically Signed   By: Lavonia Dana M.D.   On: 02/24/2018 18:13    Discharge Exam: Vitals:   02/27/18 2120 02/28/18 0612  BP: (!) 155/86 (!) 143/93  Pulse: 89 91  Resp: 20 19  Temp: 97.6 F (36.4 C) 98.2 F (36.8 C)  SpO2: 99% 99%   Vitals:   02/27/18 1317 02/27/18 1549 02/27/18 2120 02/28/18 0612  BP: (!) 88/59 110/60 (!) 155/86 (!) 143/93  Pulse: 71  89 91  Resp: 16  20 19   Temp: 98.7 F (37.1 C)  97.6 F (36.4 C) 98.2 F (36.8 C)  TempSrc: Oral     SpO2: 96%  99% 99%  Weight:      Height:        General: Pt is alert, awake, not in acute distress Cardiovascular: RRR, S1/S2 +, no rubs, no gallops Respiratory: CTA bilaterally, no wheezing, no rhonchi Abdominal: Soft, NT, ND, bowel sounds + Extremities: no edema, no cyanosis   The results of significant diagnostics from this hospitalization (including imaging, microbiology, ancillary and laboratory) are listed below for reference.     Microbiology: Recent Results (from the past 240 hour(s))  Culture, blood (Routine X 2) w Reflex to ID Panel     Status: None (Preliminary result)    Collection Time: 02/24/18  6:13 PM  Result Value Ref Range Status   Specimen Description   Final    BLOOD RIGHT FOREARM Performed at [facility] [address], [place], [place] [address]    Special Requests   Final    BOTTLES DRAWN AEROBIC AND ANAEROBIC Blood Culture results may not be optimal due to an inadequate volume of blood received in culture bottles Performed at [facility] [address], [place], [place] [address]    Culture   Final    NO GROWTH 3 DAYS Performed at [facility], [place] [address], [place], [place] [address]    Report Status PENDING  Incomplete  Culture, blood (Routine X 2) w Reflex to ID Panel     Status: None (Preliminary result)   Collection Time: 02/24/18  6:13 PM  Result Value Ref Range Status   Specimen Description   Final    BLOOD RIGHT HAND Performed at [facility] [address] [place], [place] [address]    Special Requests   Final    BOTTLES DRAWN AEROBIC AND ANAEROBIC Blood Culture adequate volume Performed at [facility] [address], [place], [place] [address]    Culture   Final    NO GROWTH 3 DAYS Performed at [facility], [place] [address], [place], [place] [address]    Report Status PENDING  Incomplete  Culture, Urine     Status: Abnormal   Collection Time: 02/24/18  6:36 PM  Result Value Ref Range Status   Specimen Description   Final    URINE, CLEAN CATCH Performed at [facility], [place] [address], [place], [place] [address]    Special Requests   Final    NONE Performed at [facility], [place] [address], [place], [place] [address]    Culture >=100,000 COLONIES/mL ESCHERICHIA COLI (A)  Final   Report Status 02/27/2018 FINAL  Final   Organism ID, Bacteria ESCHERICHIA COLI (A)  Final      Susceptibility   Escherichia coli - MIC*    AMPICILLIN >=32 RESISTANT Resistant     CEFAZOLIN <=4 SENSITIVE  Sensitive     CEFTRIAXONE <=1 SENSITIVE Sensitive     CIPROFLOXACIN <=0.25 SENSITIVE Sensitive     GENTAMICIN <=1 SENSITIVE Sensitive     IMIPENEM <=0.25 SENSITIVE Sensitive     NITROFURANTOIN <=16  SENSITIVE Sensitive     TRIMETH/SULFA <=20 SENSITIVE Sensitive     AMPICILLIN/SULBACTAM 16 INTERMEDIATE Intermediate     PIP/TAZO <=4 SENSITIVE Sensitive     Extended ESBL NEGATIVE Sensitive     * >=100,000 COLONIES/mL ESCHERICHIA COLI  MRSA PCR Screening     Status: None   Collection Time: 02/25/18  6:11 PM  Result Value Ref Range Status   MRSA by PCR NEGATIVE NEGATIVE Final    Comment:        The GeneXpert MRSA Assay (FDA approved for NASAL specimens only), is one component of a comprehensive MRSA colonization surveillance program. It is not intended to diagnose MRSA infection nor to guide or monitor treatment for MRSA infections. Performed at [facility], [place] [address], [place], [place] [address]      Labs: BNP (last 3 results) Recent Labs    12/05/17 1936 12/08/17 0415  BNP 1,182.7* 932.6*   Basic Metabolic Panel: Recent Labs  Lab 02/24/18 1805 02/25/18 0410 02/26/18 0414 02/27/18 0422  NA 131* 134* 134* 136  K 3.6 3.9 3.7 3.4*  CL 87* 90* 94* 95*  CO2 33* 35* 29 31  GLUCOSE 126* 97 115* 95  BUN 18 17 15 14   CREATININE 1.29* 1.13 1.02 0.98  CALCIUM 8.4* 8.4* 8.4* 8.5*  MG 1.9  --  1.7 1.8   Liver Function Tests: Recent Labs  Lab 02/24/18 1805  AST 69*  ALT 86*  ALKPHOS 101  BILITOT 1.0  PROT 5.9*  ALBUMIN 2.4*   No results for input(s): LIPASE, AMYLASE in the last 168 hours. No results for input(s): AMMONIA in the last 168 hours. CBC: Recent Labs  Lab 02/24/18 1805 02/25/18 0410 02/26/18 0414 02/27/18 0422  WBC 15.9* 13.3* 13.8* 11.8*  NEUTROABS 12.4*  --  10.2* 8.5*  HGB 13.2 12.8* 13.0 13.6  HCT 39.6 38.5* 40.4 41.5  MCV 96.8 97.0 97.3 97.0  PLT 312 282 295 314   Cardiac Enzymes: Recent Labs  Lab 02/24/18 1805   TROPONINI 0.12*   BNP: Invalid input(s): POCBNP CBG: No results for input(s): GLUCAP in the last 168 hours. D-Dimer No results for input(s): DDIMER in the last 72 hours. Hgb A1c No results for input(s): HGBA1C in the last 72 hours. Lipid Profile No results for input(s): CHOL, HDL, LDLCALC, TRIG, CHOLHDL, LDLDIRECT in the last 72 hours. Thyroid function studies No results for input(s): TSH, T4TOTAL, T3FREE, THYROIDAB in the last 72 hours.  Invalid input(s): FREET3 Anemia work up No results for input(s): VITAMINB12, FOLATE, FERRITIN, TIBC, IRON, RETICCTPCT in the last 72 hours. Urinalysis    Component Value Date/Time   COLORURINE YELLOW 02/24/2018 1836   APPEARANCEUR TURBID (A) 02/24/2018 1836   LABSPEC 1.012 02/24/2018 1836   PHURINE 7.0 02/24/2018 1836   GLUCOSEU NEGATIVE 02/24/2018 1836   HGBUR SMALL (A) 02/24/2018 1836   BILIRUBINUR NEGATIVE 02/24/2018 1836   KETONESUR NEGATIVE 02/24/2018 1836   PROTEINUR >=300 (A) 02/24/2018 1836   NITRITE NEGATIVE 02/24/2018 1836   LEUKOCYTESUR MODERATE (A) 02/24/2018 1836   Sepsis Labs Invalid input(s): PROCALCITONIN,  WBC,  LACTICIDVEN Microbiology Recent Results (from the past 240 hour(s))  Culture, blood (Routine X 2) w Reflex to ID Panel     Status: None (Preliminary result)   Collection Time: 02/24/18  6:13 PM  Result Value Ref Range Status   Specimen Description   Final    BLOOD RIGHT FOREARM Performed at [facility], [place] [address], [place], [place] [address]  Special Requests   Final    BOTTLES DRAWN AEROBIC AND ANAEROBIC Blood Culture results may not be optimal due to an inadequate volume of blood received in culture bottles Performed at [facility] [address], [place], [place] [address]    Culture   Final    NO GROWTH 3 DAYS Performed at [facility], [place] [address], [place], [place] [address]    Report Status PENDING  Incomplete  Culture, blood (Routine X 2) w  Reflex to ID Panel     Status: None (Preliminary result)   Collection Time: 02/24/18  6:13 PM  Result Value Ref Range Status   Specimen Description   Final    BLOOD RIGHT HAND Performed at [facility] [address], [place], [place] [address]    Special Requests   Final    BOTTLES DRAWN AEROBIC AND ANAEROBIC Blood Culture adequate volume Performed at [facility] [address] [place], [place] [address]    Culture   Final    NO GROWTH 3 DAYS Performed at [facility], [place] [address] [place], [place] [address]    Report Status PENDING  Incomplete  Culture, Urine     Status: Abnormal   Collection Time: 02/24/18  6:36 PM  Result Value Ref Range Status   Specimen Description   Final    URINE, CLEAN CATCH Performed at [facility], [place] [address], [place], [place] [address]    Special Requests   Final    NONE Performed at [facility], [place] [address] [place], [place] [address]    Culture >=100,000 COLONIES/mL ESCHERICHIA COLI (A)  Final   Report Status 02/27/2018 FINAL  Final   Organism ID, Bacteria ESCHERICHIA COLI (A)  Final      Susceptibility   Escherichia coli - MIC*    AMPICILLIN >=32 RESISTANT Resistant     CEFAZOLIN <=4 SENSITIVE Sensitive     CEFTRIAXONE <=1 SENSITIVE Sensitive     CIPROFLOXACIN <=0.25 SENSITIVE Sensitive     GENTAMICIN <=1 SENSITIVE Sensitive     IMIPENEM <=0.25 SENSITIVE Sensitive     NITROFURANTOIN <=16 SENSITIVE Sensitive     TRIMETH/SULFA <=20 SENSITIVE Sensitive     AMPICILLIN/SULBACTAM 16 INTERMEDIATE Intermediate     PIP/TAZO <=4 SENSITIVE Sensitive     Extended ESBL NEGATIVE Sensitive     * >=100,000 COLONIES/mL ESCHERICHIA COLI  MRSA PCR Screening     Status: None   Collection Time: 02/25/18  6:11 PM  Result Value Ref Range Status   MRSA by PCR NEGATIVE NEGATIVE Final    Comment:        The GeneXpert MRSA Assay (FDA approved for NASAL  specimens only), is one component of a comprehensive MRSA colonization surveillance program. It is not intended to diagnose MRSA infection nor to guide or monitor treatment for MRSA infections. Performed at [facility], [place] [address] [place], [place] [address]     Time coordinating discharge: 32 minutes  SIGNED:  Chipper Oman, MD  [facility] 02/28/2018, 9:21 AM  Pager please text page via  [email or web address]  Note - This record has been created using [employer]. Chart creation errors have been sought, but may not always have been located. Such creation errors do not reflect on the standard of medical care.

## 2018-02-28 NOTE — Progress Notes (Signed)
Report given to [place] at [facility].  Barbee Shropshire. Brigitte Pulse, RN

## 2018-02-28 NOTE — Progress Notes (Signed)
Attempted to call report to [facility], no success at this time.  [name]. Brigitte Pulse, RN

## 2018-03-01 LAB — CULTURE, BLOOD (ROUTINE X 2)
CULTURE: NO GROWTH
Culture: NO GROWTH
Special Requests: ADEQUATE

## 2018-03-02 ENCOUNTER — Encounter: Payer: Self-pay | Admitting: Internal Medicine

## 2018-03-02 ENCOUNTER — Non-Acute Institutional Stay (SKILLED_NURSING_FACILITY): Payer: Medicare HMO | Admitting: Internal Medicine

## 2018-03-02 DIAGNOSIS — G9341 Metabolic encephalopathy: Secondary | ICD-10-CM

## 2018-03-02 DIAGNOSIS — B9620 Unspecified Escherichia coli [E. coli] as the cause of diseases classified elsewhere: Secondary | ICD-10-CM

## 2018-03-02 DIAGNOSIS — J181 Lobar pneumonia, unspecified organism: Secondary | ICD-10-CM

## 2018-03-02 DIAGNOSIS — E785 Hyperlipidemia, unspecified: Secondary | ICD-10-CM | POA: Diagnosis not present

## 2018-03-02 DIAGNOSIS — N179 Acute kidney failure, unspecified: Secondary | ICD-10-CM | POA: Diagnosis not present

## 2018-03-02 DIAGNOSIS — J189 Pneumonia, unspecified organism: Secondary | ICD-10-CM

## 2018-03-02 DIAGNOSIS — N390 Urinary tract infection, site not specified: Secondary | ICD-10-CM | POA: Diagnosis not present

## 2018-03-02 DIAGNOSIS — I2510 Atherosclerotic heart disease of native coronary artery without angina pectoris: Secondary | ICD-10-CM

## 2018-03-02 DIAGNOSIS — I5022 Chronic systolic (congestive) heart failure: Secondary | ICD-10-CM

## 2018-03-02 NOTE — Progress Notes (Signed)
: Provider:   Noah Delaine. Sheppard Coil, MD Location:  [place] Room Number: 111P Place of Service:  SNF ([phone])  PCP: Gregor Hams, FNP Patient Care Team: Gregor Hams, FNP as PCP - General (Family Medicine) [name], MD as Rounding Team (Internal Medicine)  Extended Emergency Contact Information Primary Emergency Contact: Brinton,Barbara Address: [address]          [place], [place] [address] [place] Phone: [phone] Relation: Spouse     Allergies: Neosporin [neomycin-bacitracin zn-polymyx]; Penicillins; and Penicillin g  Chief Complaint  Patient presents with  . New Admit To SNF    Admit to Facility     HPI: Patient is 84 y.o. male with ventricular bigeminy, NSVT, nonobstructive coronary artery disease, hypertension, bladder cancer, atrial fibrillation not on anticoagulation, who was brought in to [facility] emergency department per EMS with complaints of poor p.o. intake over the last 3 days, weakness, confusion and cough.  Patient spouse have been restricting patient's fluid intake because of CHF history.  Patient denied chest pain abdominal pain vomiting or loose stool, fever chills dysuria.  In the ED patient blood pressure was initially 71/59, WBC 15.9, creatinine 1.29, lactic acid 1.37, UA with moderate leukocyte esterase, chest x-ray with new right basilar consolidation.  Patient was started on IV vancomycin and Levaquin.  Patient was admitted to [facility] from 6/11-15 where he was treated for community-acquired pneumonia with Rocephin then [facility] and for E. coli UTI treated with Rocephin then Omnicef.  Patient was acutely encephalopathic, which resolved and had acute on chronic kidney disease which resolved with IV fluids.  Patient is admitted to skilled nursing facility for OT/PT.  At skilled nursing facility patient will be followed for coronary disease treated with aspirin, hyperlipidemia treated  with Lipitor and congestive heart failure treated with Lasix.  Past Medical History:  Diagnosis Date  . A-fib ([place])   . AKI (acute kidney injury) ([place])   . Arthritis   . Benign hypertension with chronic kidney disease, stage III ([place]) 11/28/2015  . Bladder cancer ([place])   . CAD (coronary artery disease) 05/31/2016   cath 02/28/11: dLM 10%, pLAD 20%, pRCA calcified nodule 50% (FFR 0.86 - not significant), mRCA 40-50%, mildly elevated filling pressures, EF 50-55%  . Cataract   . Chronic kidney disease   . CKD (chronic kidney disease) stage 3, GFR 30-59 ml/min (HCC)   . CKD (chronic kidney disease), stage III ([place]) 11/28/2015   follows up with Dr. Clydell Hakim Last creatinine 1.36, EGFR 50  . Dyslipidemia   . First degree AV block 06/28/2013  . Gait abnormality 05/26/2017  . Glaucoma   . Hard of hearing   . Hyperlipidemia 11/28/2015   Last Assessment & Plan: Lipids were at goal a year ago; continue statin  . Hypertension    LOV with EKG 06/11/11 Dr Caryl Comes  [facility]  . Malignant neoplasm of prostate [facility])    surgery and radiation  . Malignant neoplasm of urinary bladder ([place]) 07/01/2012   surgery and chemo  . Memory difficulty 05/26/2017  . NSVT (nonsustained ventricular tachycardia) ([place]) 11/28/2015   Overview: Overview: Overview: echo 6/12: EF 55%, basal inferolat AK, mild LVH Last Assessment & Plan: resolved Overview: echo 6/12: EF 55%, basal inferolat AK, mild LVH Last Assessment & Plan: resolved  . Paroxysmal ventricular tachycardia ([place]) 05/31/2016   Overview: Overview: echo 6/12: EF 55%, basal inferolat AK, mild LVH Last Assessment & Plan: resolved  .  Prostate cancer ([place])    status post brachytherapy---in 2003  . PVC (premature ventricular contraction)   . Sleep apnea    STOP BANG SCORE 4  . Ventricular bigeminy 05/31/2016   Overview: Overview: rhythmol Date PR QRS 6/12 23 088 10/13 24 110 10/14 31 109 Last Assessment & Plan: The patient is without symptomatic oral echocardiographic  ectopy. His exercise tolerance is quite good. I am concerned about the interval prolongation of his PR interval. We will follow this closely. It is potentially related to the presence of his 1C antiarrhythmic and we may need to discontinue  . Ventricular tachycardia, nonsustained/bigeminy    echo 6/12: EF 55%, basal inferolat AK, mild LVH    Past Surgical History:  Procedure Laterality Date  . APPENDECTOMY  1951  . BLADDER SURGERY  2013   followed by chemo  . CARDIAC CATHETERIZATION  6/12  . CATARACT EXTRACTION W/ INTRAOCULAR LENS IMPLANT Right 2016   Right eye [facility] Dr. Lennox Pippins  . CATARACT EXTRACTION W/ INTRAOCULAR LENS IMPLANT Left 2014  . CORONARY ANGIOPLASTY     no significant blockage requiring stenting per patient  . CYSTOSCOPY  05/20/2012   Procedure: CYSTOSCOPY;  Surgeon: Alexis Frock, MD;  Location: [facility];  Service: Urology;  Laterality: N/A;  . CYSTOSCOPY  07/01/2012   Procedure: CYSTOSCOPY;  Surgeon: Molli Hazard, MD;  Location: [facility];  Service: Urology;  Laterality: N/A;  . DESCEMETS STRIPPING AUTOMATED ENDOTHELIAL KERATOPLASTY Right 06/13/2016   Procedure: DESCEMETS STRIPPING ENDOTHELIAL KERATOPLASTY; Surgeon: Marilynne Halsted, MD; Location: [facility] MAIN OR; Service: Ophthalmology; Laterality: Right;  . DESCEMETS STRIPPING AUTOMATED ENDOTHELIAL KERATOPLASTY Right 11/14/2016   Procedure: DESCEMETS STRIPPING ENDOTHELIAL KERATOPLASTY; Surgeon: Marilynne Halsted, MD; Location: [facility] MAIN OR; Service: Ophthalmology; Laterality: Right;  . EYE SURGERY     left cataract extraction with IOL  . PROSTATE SURGERY  2003   followed by radiation  . SKIN BIOPSY     BCC excised   . SKIN CANCER EXCISION     x 4  . TRANSURETHRAL RESECTION OF BLADDER TUMOR  05/20/2012   Procedure: TRANSURETHRAL RESECTION OF BLADDER TUMOR (TURBT);  Surgeon: Alexis Frock, MD;  Location: [facility];  Service: Urology;  Laterality: N/A;  [place]   . TRANSURETHRAL RESECTION OF BLADDER TUMOR   07/01/2012   Procedure: TRANSURETHRAL RESECTION OF BLADDER TUMOR (TURBT);  Surgeon: Molli Hazard, MD;  Location: [facility];  Service: Urology;  Laterality: N/A;  Cystoscopy, TURBT, be prepared for Open Cystorraphy        Allergies as of 03/02/2018      Reactions   Neosporin [neomycin-bacitracin Zn-polymyx] Other (See Comments)   Makes his skin red   Penicillins Hives, Itching   Penicillin G Rash      Medication List        Accurate as of 03/02/18 10:04 AM. Always use your most recent med list.          aspirin EC 81 MG tablet Take 81 mg by mouth daily.   atorvastatin 20 MG tablet Commonly known as:  LIPITOR Take 20 mg by mouth every evening.   cefdinir 300 MG capsule Commonly known as:  OMNICEF Take 1 capsule (300 mg total) by mouth every 12 (twelve) hours for 5 days.   furosemide 20 MG tablet Commonly known as:  LASIX Take 1 tablet (20 mg total) by mouth daily.   prednisoLONE acetate 1 % ophthalmic suspension Commonly known as:  PRED FORTE Place 2 drops into both eyes 2 (two) times daily  as needed (redness).       No orders of the defined types were placed in this encounter.   Immunization History  Administered Date(s) Administered  . Influenza Inj Mdck Quad Pf 07/24/2016  . Influenza, High Dose Seasonal PF 07/24/2016, 08/22/2017  . Influenza,inj,Quad PF,6+ Mos 07/24/2016  . Tdap 02/11/2016    Social History   Tobacco Use  . Smoking status: Former Smoker    Types: Cigars    Last attempt to quit: 05/20/1995    Years since quitting: 22.8  . Smokeless tobacco: Current User    Types: Chew  Substance Use Topics  . Alcohol use: Not Currently    Family history is   Family History  Problem Relation Age of Onset  . Hypertension Mother   . Arthritis Son   . Heart Problems Son        Aortic valve replacement  . Lung cancer Daughter   . Heart attack Neg Hx   . Stroke Neg Hx       Review of Systems  DATA OBTAINED: from patient,  nurse GENERAL:  no fevers, fatigue, appetite changes SKIN: No itching, or rash EYES: No eye pain, redness, discharge EARS: No earache, tinnitus, change in hearing NOSE: No congestion, drainage or bleeding  MOUTH/THROAT: No mouth or tooth pain, No sore throat RESPIRATORY: No cough, wheezing, SOB CARDIAC: No chest pain, palpitations, lower extremity edema  GI: No abdominal pain, No N/V/D or constipation, No heartburn or reflux  GU: No dysuria, frequency or urgency, or incontinence  MUSCULOSKELETAL: No unrelieved bone/joint pain NEUROLOGIC: No headache, dizziness or focal weakness PSYCHIATRIC: No c/o anxiety or sadness   Vitals:   03/02/18 0931  BP: 139/88  Pulse: 76  Resp: 18  Temp: (!) 97 F (36.1 C)  SpO2: 95%    SpO2 Readings from Last 1 Encounters:  03/02/18 95%   Body mass index is 29.41 kg/m.     Physical Exam  GENERAL APPEARANCE: Alert, conversant,  No acute distress.  SKIN: No diaphoresis rash HEAD: Normocephalic, atraumatic  EYES: Conjunctiva/lids clear. Pupils round, reactive. EOMs intact.  EARS: External exam WNL, canals clear;  HOH NOSE: No deformity or discharge.  MOUTH/THROAT: Lips w/o lesions  RESPIRATORY: Breathing is even, unlabored. Lung sounds are clear   CARDIOVASCULAR: Heart RRR no murmurs, rubs or gallops. No peripheral edema.   GASTROINTESTINAL: Abdomen is soft, non-tender, not distended w/ normal bowel sounds. GENITOURINARY: Bladder non tender, not distended  MUSCULOSKELETAL: No abnormal joints or musculature NEUROLOGIC:  Cranial nerves 2-12 grossly intact. Moves all extremities  PSYCHIATRIC: Mood and affect appropriate to situation, no behavioral issues  Patient Active Problem List   Diagnosis Date Noted  . Pneumonia 02/24/2018  . Pleural effusion on right 12/05/2017  . Acute respiratory failure with hypoxia ([place]) 12/05/2017  . Memory difficulty 05/26/2017  . Gait abnormality 05/26/2017  . Constipation 01/03/2017  . Atrial fibrillation  ([place]) 01/03/2017  . Hyponatremia 01/03/2017  . First degree AV block 06/28/2013  . Bladder cancer ([place]) 07/01/2012  . Cataracts, bilateral 03/14/2011  . Ventricular bigeminy   . Nonsustained ventricular tachycardia ([place])   . Nonobstructive CAD (coronary artery disease)   . Hypertension   . Hyperlipidemia       Labs reviewed: Basic Metabolic Panel:    Component Value Date/Time   NA 136 02/27/2018 0422   NA 139 05/26/2017 1433   K 3.4 (L) 02/27/2018 0422   CL 95 (L) 02/27/2018 0422   CO2 31 02/27/2018 0422   GLUCOSE  95 02/27/2018 0422   BUN 14 02/27/2018 0422   BUN 14 05/26/2017 1433   CREATININE 0.98 02/27/2018 0422   CALCIUM 8.5 (L) 02/27/2018 0422   PROT 5.9 (L) 02/24/2018 1805   PROT 6.6 05/26/2017 1433   ALBUMIN 2.4 (L) 02/24/2018 1805   ALBUMIN 4.5 05/26/2017 1433   AST 69 (H) 02/24/2018 1805   ALT 86 (H) 02/24/2018 1805   ALKPHOS 101 02/24/2018 1805   BILITOT 1.0 02/24/2018 1805   BILITOT 0.8 05/26/2017 1433   GFRNONAA >60 02/27/2018 0422   GFRAA >60 02/27/2018 0422    Recent Labs    12/06/17 0177  02/24/18 1805 02/25/18 0410 02/26/18 0414 02/27/18 0422  NA 136   < > 131* 134* 134* 136  K 4.6   < > 3.6 3.9 3.7 3.4*  CL 96*   < > 87* 90* 94* 95*  CO2 30   < > 33* 35* 29 31  GLUCOSE 117*   < > 126* 97 115* 95  BUN 18   < > 18 17 15 14   CREATININE 0.92   < > 1.29* 1.13 1.02 0.98  CALCIUM 9.3   < > 8.4* 8.4* 8.4* 8.5*  MG 2.3   < > 1.9  --  1.7 1.8  PHOS 4.3  --   --   --   --   --    < > = values in this interval not displayed.   Liver Function Tests: Recent Labs    12/05/17 1936 12/06/17 0608 02/24/18 1805  AST 32 38 69*  ALT 29 36 86*  ALKPHOS 100 100 101  BILITOT 0.8 0.9 1.0  PROT 6.4* 6.7 5.9*  ALBUMIN 3.8 3.8 2.4*   Recent Labs    12/05/17 1936  LIPASE 44   No results for input(s): AMMONIA in the last 8760 hours. CBC: Recent Labs    02/24/18 1805 02/25/18 0410 02/26/18 0414 02/27/18 0422  WBC 15.9* 13.3* 13.8* 11.8*   NEUTROABS 12.4*  --  10.2* 8.5*  HGB 13.2 12.8* 13.0 13.6  HCT 39.6 38.5* 40.4 41.5  MCV 96.8 97.0 97.3 97.0  PLT 312 282 295 314   Lipid No results for input(s): CHOL, HDL, LDLCALC, TRIG in the last 8760 hours.  Cardiac Enzymes: Recent Labs    12/06/17 0436 12/06/17 0942 02/24/18 1805  TROPONINI 0.10* 0.09* 0.12*   BNP: Recent Labs    12/05/17 1936 12/08/17 0415  BNP 1,182.7* 666.7*   No results found for: [facility] Lab Results  Component Value Date   HGBA1C 5.8 (H) 01/04/2017   Lab Results  Component Value Date   TSH 2.090 12/06/2017   Lab Results  Component Value Date   VITAMINB12 402 12/07/2017   Lab Results  Component Value Date   FOLATE 15.3 12/07/2017   No results found for: IRON, TIBC, FERRITIN  Imaging and Procedures obtained prior to SNF admission: Dg Chest Port 1 View  Result Date: 02/24/2018 CLINICAL DATA:  Failure to thrive, not eating for 3 days, history hypertension and prostate cancer EXAM: PORTABLE CHEST 1 VIEW COMPARISON:  Portable exam 1748 hours compared to 12/06/2017 FINDINGS: Enlargement of cardiac silhouette. Mediastinal contours and pulmonary vascularity normal. New RIGHT basilar infiltrate question pneumonia versus aspiration. Probable small RIGHT pleural effusion. Upper lungs clear. No pneumothorax. Bones unremarkable. IMPRESSION: New RIGHT basilar consolidation which could represent pneumonia or aspiration. Probable small RIGHT pleural effusion. Enlargement of cardiac silhouette. Electronically Signed   By: Lavonia Dana M.D.   On: 02/24/2018  18:13     Not all labs, radiology exams or other studies done during hospitalization come through on my EPIC note; however they are reviewed by me.    Assessment and Plan  Community-acquired pneumonia-cough, leukocytosis and chest x-ray with right basilar consolidation; initially treated with cefepime Flagyl and vancomycin then switched to Rocephin alone then Omnicef to complete therapy for 10  days; swallow eval with no signs of gross aspiration SNF -continue Omnicef 300 mg every 12 for 5 more days  E. coli UTI-sensitive to Rocephin SNF -continue Omnicef 300 mg every 12 for 5 more days  Acute metabolic encephalopathy-resolved  Acute kidney injury-from hypotension and dehydration resolved with IV fluid SNF -follow-up BMP  Chronic systolic congestive heart failure- echo 11/2017 with EF 35 to 40% with diffuse hypokinesis SNF -continue Lasix 20 mg p.o. Daily  Coronary artery disease SNF -continue ASA 81 mg daily: Patient is on statin  Hyperlipidemia SNF -not stated as uncontrolled; continue Lipitor 20 mg daily   Time spent greater than 45 minutes;> 50% of time with patient was spent reviewing records, labs, tests and studies, counseling and developing plan of care  Webb Silversmith D. Sheppard Coil, MD

## 2018-03-10 ENCOUNTER — Encounter: Payer: Self-pay | Admitting: Internal Medicine

## 2018-03-10 DIAGNOSIS — B9620 Unspecified Escherichia coli [E. coli] as the cause of diseases classified elsewhere: Secondary | ICD-10-CM | POA: Insufficient documentation

## 2018-03-10 DIAGNOSIS — I5022 Chronic systolic (congestive) heart failure: Secondary | ICD-10-CM | POA: Insufficient documentation

## 2018-03-10 DIAGNOSIS — N179 Acute kidney failure, unspecified: Secondary | ICD-10-CM | POA: Insufficient documentation

## 2018-03-10 DIAGNOSIS — G9341 Metabolic encephalopathy: Secondary | ICD-10-CM | POA: Insufficient documentation

## 2018-03-10 DIAGNOSIS — N390 Urinary tract infection, site not specified: Secondary | ICD-10-CM

## 2018-03-16 ENCOUNTER — Non-Acute Institutional Stay (SKILLED_NURSING_FACILITY): Payer: Medicare HMO | Admitting: Internal Medicine

## 2018-03-16 ENCOUNTER — Encounter: Payer: Self-pay | Admitting: Internal Medicine

## 2018-03-16 DIAGNOSIS — I2510 Atherosclerotic heart disease of native coronary artery without angina pectoris: Secondary | ICD-10-CM

## 2018-03-16 DIAGNOSIS — B9620 Unspecified Escherichia coli [E. coli] as the cause of diseases classified elsewhere: Secondary | ICD-10-CM | POA: Diagnosis not present

## 2018-03-16 DIAGNOSIS — I5022 Chronic systolic (congestive) heart failure: Secondary | ICD-10-CM | POA: Diagnosis not present

## 2018-03-16 DIAGNOSIS — G9341 Metabolic encephalopathy: Secondary | ICD-10-CM

## 2018-03-16 DIAGNOSIS — N390 Urinary tract infection, site not specified: Secondary | ICD-10-CM

## 2018-03-16 DIAGNOSIS — I480 Paroxysmal atrial fibrillation: Secondary | ICD-10-CM

## 2018-03-16 DIAGNOSIS — N179 Acute kidney failure, unspecified: Secondary | ICD-10-CM

## 2018-03-16 DIAGNOSIS — E785 Hyperlipidemia, unspecified: Secondary | ICD-10-CM

## 2018-03-16 DIAGNOSIS — J181 Lobar pneumonia, unspecified organism: Secondary | ICD-10-CM | POA: Diagnosis not present

## 2018-03-16 DIAGNOSIS — J189 Pneumonia, unspecified organism: Secondary | ICD-10-CM

## 2018-03-16 NOTE — Progress Notes (Signed)
.   Location:  [place] Room Number: 111-P Place of Service:  SNF (31)   Noah Delaine. Sheppard Coil, MD  PCP: Gregor Hams, FNP Patient Care Team: Gregor Hams, FNP as PCP - General (Family Medicine) [name], MD as Rounding Team (Internal Medicine)  Extended Emergency Contact Information Primary Emergency Contact: Schueller,Barbara Address: [address]          [place], [place] [address] [place] Phone: [phone] Relation: Spouse  Allergies  Allergen Reactions  . Neosporin [Neomycin-Bacitracin Zn-Polymyx] Other (See Comments)    Makes his skin red   . Penicillins Hives and Itching  . Penicillin G Rash    Chief Complaint  Patient presents with  . Discharge Note    Discharge from [facility]    HPI:  84 y.o. male with ventricular bigeminy, NSVT, nonobstructive coronary artery disease, hypertension, bladder cancer, atrial fibrillation not on coagulation, who was brought to [facility] emergency department per he EMS with complaints of poor p.o. intake over the last 3 days, weakness, confusion, and cough.  Patient spouse have been restricting patient's fluid intake because of CHF history.  Patient denied chest pain abdominal pain vomiting or loose stool fever chills dysuria.  In the ED patient's blood pressure was initially 71/59, WBC 15.9, creatinine 1.29, lactic acid 1.37, UA with moderate leukocyte esterase, chest x-ray with new right basilar consolidation.  Patient was started on IV vancomycin and Levaquin.  Patient was admitted to [facility] from 6/11-15 where he was treated for community-acquired pneumonia with Rocephin then [facility] and for E. coli UTI treated with Rocephin then Omnicef. patient was acutely encephalopathic which resolved and had acute on chronic kidney disease which resolved with IV fluids.  Patient was admitted to skilled nursing facility for OT/PT and is now ready to be discharged to an  assisted living facility.    Past Medical History:  Diagnosis Date  . A-fib ([place])   . AKI (acute kidney injury) ([place])   . Arthritis   . Benign hypertension with chronic kidney disease, stage III ([place]) 11/28/2015  . Bladder cancer ([place])   . CAD (coronary artery disease) 05/31/2016   cath 02/28/11: dLM 10%, pLAD 20%, pRCA calcified nodule 50% (FFR 0.86 - not significant), mRCA 40-50%, mildly elevated filling pressures, EF 50-55%  . Cataract   . Chronic kidney disease   . CKD (chronic kidney disease) stage 3, GFR 30-59 ml/min (HCC)   . CKD (chronic kidney disease), stage III ([place]) 11/28/2015   follows up with Dr. Clydell Hakim Last creatinine 1.36, EGFR 50  . Dyslipidemia   . First degree AV block 06/28/2013  . Gait abnormality 05/26/2017  . Glaucoma   . Hard of hearing   . Hyperlipidemia 11/28/2015   Last Assessment & Plan: Lipids were at goal a year ago; continue statin  . Hypertension    LOV with EKG 06/11/11 Dr Caryl Comes  [facility]  . Malignant neoplasm of prostate [facility])    surgery and radiation  . Malignant neoplasm of urinary bladder ([place]) 07/01/2012   surgery and chemo  . Memory difficulty 05/26/2017  . NSVT (nonsustained ventricular tachycardia) ([place]) 11/28/2015   Overview: Overview: Overview: echo 6/12: EF 55%, basal inferolat AK, mild LVH Last Assessment & Plan: resolved Overview: echo 6/12: EF 55%, basal inferolat AK, mild LVH Last Assessment & Plan: resolved  . Paroxysmal ventricular tachycardia ([place]) 05/31/2016   Overview: Overview: echo 6/12: EF 55%, basal inferolat AK, mild  LVH Last Assessment & Plan: resolved  . Prostate cancer ([place])    status post brachytherapy---in 2003  . PVC (premature ventricular contraction)   . Sleep apnea    STOP BANG SCORE 4  . Ventricular bigeminy 05/31/2016   Overview: Overview: rhythmol Date PR QRS 6/12 23 088 10/13 24 110 10/14 31 109 Last Assessment & Plan: The patient is without symptomatic oral echocardiographic ectopy. His exercise tolerance is  quite good. I am concerned about the interval prolongation of his PR interval. We will follow this closely. It is potentially related to the presence of his 1C antiarrhythmic and we may need to discontinue  . Ventricular tachycardia, nonsustained/bigeminy    echo 6/12: EF 55%, basal inferolat AK, mild LVH    Past Surgical History:  Procedure Laterality Date  . APPENDECTOMY  1951  . BLADDER SURGERY  2013   followed by chemo  . CARDIAC CATHETERIZATION  6/12  . CATARACT EXTRACTION W/ INTRAOCULAR LENS IMPLANT Right 2016   Right eye [facility] Dr. Lennox Pippins  . CATARACT EXTRACTION W/ INTRAOCULAR LENS IMPLANT Left 2014  . CORONARY ANGIOPLASTY     no significant blockage requiring stenting per patient  . CYSTOSCOPY  05/20/2012   Procedure: CYSTOSCOPY;  Surgeon: Alexis Frock, MD;  Location: [facility];  Service: Urology;  Laterality: N/A;  . CYSTOSCOPY  07/01/2012   Procedure: CYSTOSCOPY;  Surgeon: Molli Hazard, MD;  Location: [facility];  Service: Urology;  Laterality: N/A;  . DESCEMETS STRIPPING AUTOMATED ENDOTHELIAL KERATOPLASTY Right 06/13/2016   Procedure: DESCEMETS STRIPPING ENDOTHELIAL KERATOPLASTY; Surgeon: Marilynne Halsted, MD; Location: [facility] MAIN OR; Service: Ophthalmology; Laterality: Right;  . DESCEMETS STRIPPING AUTOMATED ENDOTHELIAL KERATOPLASTY Right 11/14/2016   Procedure: DESCEMETS STRIPPING ENDOTHELIAL KERATOPLASTY; Surgeon: Marilynne Halsted, MD; Location: [facility] MAIN OR; Service: Ophthalmology; Laterality: Right;  . EYE SURGERY     left cataract extraction with IOL  . PROSTATE SURGERY  2003   followed by radiation  . SKIN BIOPSY     BCC excised   . SKIN CANCER EXCISION     x 4  . TRANSURETHRAL RESECTION OF BLADDER TUMOR  05/20/2012   Procedure: TRANSURETHRAL RESECTION OF BLADDER TUMOR (TURBT);  Surgeon: Alexis Frock, MD;  Location: [facility];  Service: Urology;  Laterality: N/A;  [place]   . TRANSURETHRAL RESECTION OF BLADDER TUMOR  07/01/2012   Procedure: TRANSURETHRAL  RESECTION OF BLADDER TUMOR (TURBT);  Surgeon: Molli Hazard, MD;  Location: [facility];  Service: Urology;  Laterality: N/A;  Cystoscopy, TURBT, be prepared for Open Cystorraphy         reports that he quit smoking about 22 years ago. His smoking use included cigars. His smokeless tobacco use includes chew. He reports that he drank alcohol. He reports that he does not use drugs. Social History   Socioeconomic History  . Marital status: Married    Spouse name: [name] "Romie Minus"  . Number of children: 2  . Years of education: some college  . Highest education level: Not on file  Occupational History  . Occupation: Retired  Scientific laboratory technician  . Financial resource strain: Not on file  . Food insecurity:    Worry: Not on file    Inability: Not on file  . Transportation needs:    Medical: Not on file    Non-medical: Not on file  Tobacco Use  . Smoking status: Former Smoker    Types: Cigars    Last attempt to quit: 05/20/1995    Years since quitting: 22.8  . Smokeless  tobacco: Current User    Types: Chew  Substance and Sexual Activity  . Alcohol use: Not Currently  . Drug use: No  . Sexual activity: Not on file  Lifestyle  . Physical activity:    Days per week: Not on file    Minutes per session: Not on file  . Stress: Not on file  Relationships  . Social connections:    Talks on phone: Not on file    Gets together: Not on file    Attends religious service: Not on file    Active member of club or organization: Not on file    Attends meetings of clubs or organizations: Not on file    Relationship status: Not on file  . Intimate partner violence:    Fear of current or ex partner: Not on file    Emotionally abused: Not on file    Physically abused: Not on file    Forced sexual activity: Not on file  Other Topics Concern  . Not on file  Social History Narrative   He lives at home with his wife. He is remarried.    He lost his daughter at age of 55 to lung cancer. He does  not smoke.    Uses alcohol occasionally.    Denies use of recreational drugs   Caffeine use: Drinks cofe (caffeine free)   1 cup coffee per morning (decaf)   Right handed    Pertinent  Health Maintenance Due  Topic Date Due  . PNA vac Low Risk Adult (1 of 2 - PCV13) 04/07/1994  . INFLUENZA VACCINE  04/16/2018    Medications: Allergies as of 03/16/2018      Reactions   Neosporin [neomycin-bacitracin Zn-polymyx] Other (See Comments)   Makes his skin red   Penicillins Hives, Itching   Penicillin G Rash      Medication List        Accurate as of 03/16/18 11:59 PM. Always use your most recent med list.          aspirin EC 81 MG tablet Take 81 mg by mouth daily.   atorvastatin 20 MG tablet Commonly known as:  LIPITOR Take 20 mg by mouth every evening.   furosemide 20 MG tablet Commonly known as:  LASIX Take 1 tablet (20 mg total) by mouth daily.   hydrOXYzine 25 MG tablet Commonly known as:  ATARAX/VISTARIL Take 25 mg by mouth 3 (three) times daily.   prednisoLONE acetate 1 % ophthalmic suspension Commonly known as:  PRED FORTE Place 2 drops into both eyes 2 (two) times daily as needed (redness).        Vitals:   03/16/18 1234  BP: 127/73  Pulse: 90  Resp: 20  Temp: 98 F (36.7 C)  SpO2: 93%  Weight: 205 lb (93 kg)  Height: 5' 10"  (1.778 m)   Body mass index is 29.41 kg/m.  Physical Exam  GENERAL APPEARANCE: Alert, conversant. No acute distress.  HEENT: Unremarkable. RESPIRATORY: Breathing is even, unlabored. Lung sounds are clear   CARDIOVASCULAR: Heart RRR no murmurs, rubs or gallops. No peripheral edema.  GASTROINTESTINAL: Abdomen is soft, non-tender, not distended w/ normal bowel sounds.  NEUROLOGIC: Cranial nerves 2-12 grossly intact. Moves all extremities   Labs reviewed: Basic Metabolic Panel: Recent Labs    12/06/17 0608  02/24/18 1805 02/25/18 0410 02/26/18 0414 02/27/18 0422  NA 136   < > 131* 134* 134* 136  K 4.6   < > 3.6 3.9  3.7 3.4*  CL 96*   < > 87* 90* 94* 95*  CO2 30   < > 33* 35* 29 31  GLUCOSE 117*   < > 126* 97 115* 95  BUN 18   < > 18 17 15 14   CREATININE 0.92   < > 1.29* 1.13 1.02 0.98  CALCIUM 9.3   < > 8.4* 8.4* 8.4* 8.5*  MG 2.3   < > 1.9  --  1.7 1.8  PHOS 4.3  --   --   --   --   --    < > = values in this interval not displayed.   No results found for: [facility] Hights Liver Function Tests: Recent Labs    12/05/17 1936 12/06/17 0608 02/24/18 1805  AST 32 38 69*  ALT 29 36 86*  ALKPHOS 100 100 101  BILITOT 0.8 0.9 1.0  PROT 6.4* 6.7 5.9*  ALBUMIN 3.8 3.8 2.4*   Recent Labs    12/05/17 1936  LIPASE 44   No results for input(s): AMMONIA in the last 8760 hours. CBC: Recent Labs    02/24/18 1805 02/25/18 0410 02/26/18 0414 02/27/18 0422  WBC 15.9* 13.3* 13.8* 11.8*  NEUTROABS 12.4*  --  10.2* 8.5*  HGB 13.2 12.8* 13.0 13.6  HCT 39.6 38.5* 40.4 41.5  MCV 96.8 97.0 97.3 97.0  PLT 312 282 295 314   Lipid No results for input(s): CHOL, HDL, LDLCALC, TRIG in the last 8760 hours. Cardiac Enzymes: Recent Labs    12/06/17 0436 12/06/17 0942 02/24/18 1805  TROPONINI 0.10* 0.09* 0.12*   BNP: Recent Labs    12/05/17 1936 12/08/17 0415  BNP 1,182.7* 666.7*   CBG: No results for input(s): GLUCAP in the last 8760 hours.  Procedures and Imaging Studies During Stay: No results found.  Assessment/Plan:   Pneumonia of right lower lobe due to infectious organism ([place])  E. coli UTI (urinary tract infection)  Acute metabolic encephalopathy  Acute kidney injury ([place])  Chronic systolic (congestive) heart failure (HCC)  Hyperlipidemia, unspecified hyperlipidemia type  Paroxysmal atrial fibrillation (HCC)  Coronary artery disease involving native coronary artery of native heart without angina pectoris   Patient is being discharged with the following home health services: OT/PT/nursing-Foley catheter care  Patient is being discharged with the following durable medical  equipment: Stationary and portable gaseous oxygen at 2 L/min nasal cannula continuously, standard wheelchair, semi-electric hospital bed  Patient has been advised to f/u with their PCP in 1-2 weeks to bring them up to date on their rehab stay.  Social services at facility was responsible for arranging this appointment.  Pt was provided with a 30 day supply of prescriptions for medications and refills must be obtained from their PCP.  For controlled substances, a more limited supply may be provided adequate until PCP appointment only.  Medications have been reconciled.  Spent greater than 30 minutes;> 50% of time with patient was spent reviewing records, labs, tests and studies, counseling and developing plan of care  Noah Delaine. Sheppard Coil, MD

## 2018-04-03 ENCOUNTER — Other Ambulatory Visit: Payer: Self-pay

## 2018-04-03 ENCOUNTER — Emergency Department (HOSPITAL_COMMUNITY)
Admission: EM | Admit: 2018-04-03 | Discharge: 2018-04-03 | Disposition: A | Discharge status: Skilled Nursing Facility | Payer: Medicare HMO | Attending: Emergency Medicine | Admitting: Emergency Medicine

## 2018-04-03 ENCOUNTER — Encounter (HOSPITAL_COMMUNITY): Payer: Self-pay | Admitting: *Deleted

## 2018-04-03 ENCOUNTER — Emergency Department (HOSPITAL_COMMUNITY): Payer: Medicare HMO

## 2018-04-03 DIAGNOSIS — N3000 Acute cystitis without hematuria: Secondary | ICD-10-CM | POA: Diagnosis not present

## 2018-04-03 DIAGNOSIS — L89159 Pressure ulcer of sacral region, unspecified stage: Secondary | ICD-10-CM | POA: Insufficient documentation

## 2018-04-03 DIAGNOSIS — I2510 Atherosclerotic heart disease of native coronary artery without angina pectoris: Secondary | ICD-10-CM | POA: Diagnosis not present

## 2018-04-03 DIAGNOSIS — I129 Hypertensive chronic kidney disease with stage 1 through stage 4 chronic kidney disease, or unspecified chronic kidney disease: Secondary | ICD-10-CM | POA: Insufficient documentation

## 2018-04-03 DIAGNOSIS — N183 Chronic kidney disease, stage 3 (moderate): Secondary | ICD-10-CM | POA: Diagnosis not present

## 2018-04-03 DIAGNOSIS — Z8551 Personal history of malignant neoplasm of bladder: Secondary | ICD-10-CM | POA: Insufficient documentation

## 2018-04-03 DIAGNOSIS — Z87891 Personal history of nicotine dependence: Secondary | ICD-10-CM | POA: Insufficient documentation

## 2018-04-03 DIAGNOSIS — Z4800 Encounter for change or removal of nonsurgical wound dressing: Secondary | ICD-10-CM | POA: Diagnosis present

## 2018-04-03 LAB — BASIC METABOLIC PANEL
Anion gap: 11 (ref 5–15)
BUN: 12 mg/dL (ref 8–23)
CHLORIDE: 90 mmol/L — AB (ref 98–111)
CO2: 34 mmol/L — ABNORMAL HIGH (ref 22–32)
Calcium: 8.9 mg/dL (ref 8.9–10.3)
Creatinine, Ser: 1.06 mg/dL (ref 0.61–1.24)
GFR calc Af Amer: >60 mL/min (ref >60)
GLUCOSE: 105 mg/dL — AB (ref 70–99)
POTASSIUM: 3.7 mmol/L (ref 3.5–5.1)
Sodium: 135 mmol/L (ref 135–145)

## 2018-04-03 LAB — URINALYSIS, ROUTINE W REFLEX MICROSCOPIC
Bilirubin Urine: NEGATIVE
Glucose, UA: NEGATIVE mg/dL
Ketones, ur: NEGATIVE mg/dL
Nitrite: NEGATIVE
PROTEIN: 30 mg/dL — AB
Specific Gravity, Urine: 1.009 (ref 1.005–1.030)
WBC, UA: >50 WBC/hpf — ABNORMAL HIGH (ref 0–5)
pH: 7 (ref 5.0–8.0)

## 2018-04-03 LAB — CBC WITH DIFFERENTIAL/PLATELET
BASOS ABS: 0 10*3/uL (ref 0.0–0.1)
Basophils Relative: 0 %
Eosinophils Absolute: 0.5 10*3/uL (ref 0.0–0.7)
Eosinophils Relative: 3 %
HEMATOCRIT: 41.1 % (ref 39.0–52.0)
Hemoglobin: 13.6 g/dL (ref 13.0–17.0)
LYMPHS PCT: 19 %
Lymphs Abs: 3 10*3/uL (ref 0.7–4.0)
MCH: 31.4 pg (ref 26.0–34.0)
MCHC: 33.1 g/dL (ref 30.0–36.0)
MCV: 94.9 fL (ref 78.0–100.0)
MONO ABS: 2 10*3/uL — AB (ref 0.1–1.0)
Monocytes Relative: 13 %
Neutro Abs: 10.3 10*3/uL — ABNORMAL HIGH (ref 1.7–7.7)
Neutrophils Relative %: 65 %
Platelets: 301 10*3/uL (ref 150–400)
RBC: 4.33 MIL/uL (ref 4.22–5.81)
RDW: 13.7 % (ref 11.5–15.5)
WBC: 15.7 10*3/uL — ABNORMAL HIGH (ref 4.0–10.5)

## 2018-04-03 MED ORDER — CEPHALEXIN 500 MG PO CAPS: 500.0000 mg | ORAL_CAPSULE | Freq: Three times a day (TID) | ORAL | 0 refills | Status: AC

## 2018-04-03 MED ORDER — IOPAMIDOL (ISOVUE-300) INJECTION 61%
100.0000 mL | Freq: Once | INTRAVENOUS | Status: AC | PRN
  Administered 2018-04-03: 100 mL via INTRAVENOUS

## 2018-04-03 MED ORDER — SODIUM CHLORIDE 0.9 % IV BOLUS
500.0000 mL | Freq: Once | INTRAVENOUS | Status: AC
  Administered 2018-04-03: 500 mL via INTRAVENOUS

## 2018-04-03 MED ORDER — SODIUM CHLORIDE 0.9 % IV SOLN
1.0000 g | Freq: Once | INTRAVENOUS | Status: AC
  Administered 2018-04-03: 1 g via INTRAVENOUS
  Filled 2018-04-03: qty 10

## 2018-04-03 MED ORDER — IOPAMIDOL (ISOVUE-300) INJECTION 61%
INTRAVENOUS | Status: AC
  Filled 2018-04-03: qty 100

## 2018-04-03 NOTE — ED Notes (Signed)
Bed: HS30 Expected date:  Expected time:  Means of arrival:  Comments: 84 yo Decubitus

## 2018-04-03 NOTE — ED Provider Notes (Signed)
Emergency Department Provider Note   I have reviewed the triage vital signs and the nursing notes.   HISTORY  Chief Complaint Wound Check   HPI [name] is a 84 y.o. male with PMH of a-fib, CKD, BPH and bladder cancer with indwelling foley cath, and HLD department for for evaluation from [place] facility.  Patient states over the last week he is developed a sore over his buttocks which is concerning staff.  He states he was sent here as a precaution.  He denies any fevers or chills.  Staff states, according to EMS, the patient has not been eating as much.  Denies any nausea or vomiting.  No diarrhea.  Denies abdominal pain.   Level 5 caveat: Dementia.   Wife called to report the patient was experiencing nausea and vomiting earlier along with abdominal pain. No further history provided. Patient denies any nausea at this time.   Past Medical History:  Diagnosis Date  . A-fib ([place])   . AKI (acute kidney injury) ([place])   . Arthritis   . Benign hypertension with chronic kidney disease, stage III ([place]) 11/28/2015  . Bladder cancer ([facility])   . CAD (coronary artery disease) 05/31/2016   cath 02/28/11: dLM 10%, pLAD 20%, pRCA calcified nodule 50% (FFR 0.86 - not significant), mRCA 40-50%, mildly elevated filling pressures, EF 50-55%  . Cataract   . Chronic kidney disease   . CKD (chronic kidney disease) stage 3, GFR 30-59 ml/min (HCC)   . CKD (chronic kidney disease), stage III ([place]) 11/28/2015   follows up with Dr. Clydell Hakim Last creatinine 1.36, EGFR 50  . Dyslipidemia   . First degree AV block 06/28/2013  . Gait abnormality 05/26/2017  . Glaucoma   . Hard of hearing   . Hyperlipidemia 11/28/2015   Last Assessment & Plan: Lipids were at goal a year ago; continue statin  . Hypertension    LOV with EKG 06/11/11 Dr Caryl Comes  [facility]  . Malignant neoplasm of prostate [facility])    surgery and radiation  . Malignant neoplasm of urinary bladder ([place]) 07/01/2012   surgery  and chemo  . Memory difficulty 05/26/2017  . NSVT (nonsustained ventricular tachycardia) ([place]) 11/28/2015   Overview: Overview: Overview: echo 6/12: EF 55%, basal inferolat AK, mild LVH Last Assessment & Plan: resolved Overview: echo 6/12: EF 55%, basal inferolat AK, mild LVH Last Assessment & Plan: resolved  . Paroxysmal ventricular tachycardia ([place]) 05/31/2016   Overview: Overview: echo 6/12: EF 55%, basal inferolat AK, mild LVH Last Assessment & Plan: resolved  . Prostate cancer ([place])    status post brachytherapy---in 2003  . PVC (premature ventricular contraction)   . Sleep apnea    STOP BANG SCORE 4  . Ventricular bigeminy 05/31/2016   Overview: Overview: rhythmol Date PR QRS 6/12 23 088 10/13 24 110 10/14 31 109 Last Assessment & Plan: The patient is without symptomatic oral echocardiographic ectopy. His exercise tolerance is quite good. I am concerned about the interval prolongation of his PR interval. We will follow this closely. It is potentially related to the presence of his 1C antiarrhythmic and we may need to discontinue  . Ventricular tachycardia, nonsustained/bigeminy    echo 6/12: EF 55%, basal inferolat AK, mild LVH    Patient Active Problem List   Diagnosis Date Noted  . E. coli UTI (urinary tract infection) 03/10/2018  . Acute metabolic encephalopathy 74/25/9563  . Acute kidney injury ([place]) 03/10/2018  . Chronic systolic (congestive) heart failure (HCC)  03/10/2018  . Pneumonia 02/24/2018  . Pleural effusion on right 12/05/2017  . Acute respiratory failure with hypoxia ([place]) 12/05/2017  . Memory difficulty 05/26/2017  . Gait abnormality 05/26/2017  . Constipation 01/03/2017  . Atrial fibrillation ([place]) 01/03/2017  . Hyponatremia 01/03/2017  . First degree AV block 06/28/2013  . Bladder cancer ([place]) 07/01/2012  . Cataracts, bilateral 03/14/2011  . Ventricular bigeminy   . Nonsustained ventricular tachycardia ([place])   . Nonobstructive CAD (coronary artery disease)     . Hypertension   . Hyperlipidemia     Past Surgical History:  Procedure Laterality Date  . APPENDECTOMY  1951  . BLADDER SURGERY  2013   followed by chemo  . CARDIAC CATHETERIZATION  6/12  . CATARACT EXTRACTION W/ INTRAOCULAR LENS IMPLANT Right 2016   Right eye [facility] Dr. Lennox Pippins  . CATARACT EXTRACTION W/ INTRAOCULAR LENS IMPLANT Left 2014  . CORONARY ANGIOPLASTY     no significant blockage requiring stenting per patient  . CYSTOSCOPY  05/20/2012   Procedure: CYSTOSCOPY;  Surgeon: Alexis Frock, MD;  Location: [facility];  Service: Urology;  Laterality: N/A;  . CYSTOSCOPY  07/01/2012   Procedure: CYSTOSCOPY;  Surgeon: Molli Hazard, MD;  Location: [facility];  Service: Urology;  Laterality: N/A;  . DESCEMETS STRIPPING AUTOMATED ENDOTHELIAL KERATOPLASTY Right 06/13/2016   Procedure: DESCEMETS STRIPPING ENDOTHELIAL KERATOPLASTY; Surgeon: Marilynne Halsted, MD; Location: [facility] MAIN OR; Service: Ophthalmology; Laterality: Right;  . DESCEMETS STRIPPING AUTOMATED ENDOTHELIAL KERATOPLASTY Right 11/14/2016   Procedure: DESCEMETS STRIPPING ENDOTHELIAL KERATOPLASTY; Surgeon: Marilynne Halsted, MD; Location: [facility] MAIN OR; Service: Ophthalmology; Laterality: Right;  . EYE SURGERY     left cataract extraction with IOL  . PROSTATE SURGERY  2003   followed by radiation  . SKIN BIOPSY     BCC excised   . SKIN CANCER EXCISION     x 4  . TRANSURETHRAL RESECTION OF BLADDER TUMOR  05/20/2012   Procedure: TRANSURETHRAL RESECTION OF BLADDER TUMOR (TURBT);  Surgeon: Alexis Frock, MD;  Location: [facility];  Service: Urology;  Laterality: N/A;  [place]   . TRANSURETHRAL RESECTION OF BLADDER TUMOR  07/01/2012   Procedure: TRANSURETHRAL RESECTION OF BLADDER TUMOR (TURBT);  Surgeon: Molli Hazard, MD;  Location: [facility];  Service: Urology;  Laterality: N/A;  Cystoscopy, TURBT, be prepared for Open Cystorraphy        Allergies Neosporin [neomycin-bacitracin zn-polymyx]; Penicillins; and  Penicillin g  Family History  Problem Relation Age of Onset  . Hypertension Mother   . Arthritis Son   . Heart Problems Son        Aortic valve replacement  . Lung cancer Daughter   . Heart attack Neg Hx   . Stroke Neg Hx     Social History Social History   Tobacco Use  . Smoking status: Former Smoker    Types: Cigars    Last attempt to quit: 05/20/1995    Years since quitting: 22.8  . Smokeless tobacco: Current User    Types: Chew  Substance Use Topics  . Alcohol use: Not Currently  . Drug use: No    Review of Systems  Constitutional: No fever/chills Eyes: No visual changes. ENT: No sore throat. Cardiovascular: Denies chest pain. Respiratory: Denies shortness of breath. Gastrointestinal: No abdominal pain.  No nausea, no vomiting.  No diarrhea.  No constipation. Decreased appetite.  Genitourinary: Negative for dysuria. Musculoskeletal: Negative for back pain. Skin: Sacral decubitus wound.  Neurological: Negative for headaches, focal weakness or numbness.  10-point ROS otherwise  negative.  ____________________________________________   PHYSICAL EXAM:  VITAL SIGNS: ED Triage Vitals [04/03/18 1608]  Enc Vitals Group     BP (!) 102/59     Pulse Rate 94     Resp (!) 23     Temp 97.8 F (36.6 C)     Temp Source Oral     SpO2 96 %   Constitutional: Alert and oriented. Well appearing and in no acute distress. Eyes: Conjunctivae are normal.  Head: Atraumatic. Nose: No congestion/rhinnorhea. Mouth/Throat: Mucous membranes are moist.  Oropharynx non-erythematous. Neck: No stridor.  Cardiovascular: Normal rate, regular rhythm. Good peripheral circulation. Grossly normal heart sounds.   Respiratory: Normal respiratory effort.  No retractions. Lungs CTAB. Gastrointestinal: Soft and nontender. No distention.  Musculoskeletal: No lower extremity tenderness nor edema. No gross deformities of extremities. Neurologic:  Normal speech and language. No gross focal  neurologic deficits are appreciated.  Skin:  Skin is warm and dry. 2 x 3 cm sacral decubitus ulcer with mild surrounding erythema and skin breakdown. No fluctuance, induration, or drainage.   ____________________________________________   LABS (all labs ordered are listed, but only abnormal results are displayed)  Labs Reviewed  BASIC METABOLIC PANEL - Abnormal; Notable for the following components:      Result Value   Chloride 90 (*)    CO2 34 (*)    Glucose, Bld 105 (*)    All other components within normal limits  CBC WITH DIFFERENTIAL/PLATELET - Abnormal; Notable for the following components:   WBC 15.7 (*)    Neutro Abs 10.3 (*)    Monocytes Absolute 2.0 (*)    All other components within normal limits  URINALYSIS, ROUTINE W REFLEX MICROSCOPIC - Abnormal; Notable for the following components:   APPearance CLOUDY (*)    Hgb urine dipstick SMALL (*)    Protein, ur 30 (*)    Leukocytes, UA LARGE (*)    WBC, UA >50 (*)    Bacteria, UA MANY (*)    All other components within normal limits  URINE CULTURE   ____________________________________________  RADIOLOGY  Ct Abdomen Pelvis W Contrast  Result Date: 04/03/2018 CLINICAL DATA:  Concern for complicated decubitus ulcer. Patient has not been eating. EXAM: CT ABDOMEN AND PELVIS WITH CONTRAST TECHNIQUE: Multidetector CT imaging of the abdomen and pelvis was performed using the standard protocol following bolus administration of intravenous contrast. CONTRAST:  142m ISOVUE-300 IOPAMIDOL (ISOVUE-300) INJECTION 61% COMPARISON:  None. FINDINGS: Lower chest: Bibasilar dependent opacities, likely atelectasis. Small BILATERAL effusions. RIGHT pleural calcifications. Coronary artery calcification. Hepatobiliary: No focal liver abnormality is seen. No gallstones, gallbladder wall thickening, or biliary dilatation. Increased density within the gallbladder, likely sludge. Pancreas: Unremarkable. Mildly atrophic. No pancreatic ductal dilatation  or surrounding inflammatory changes. Spleen: Normal in size without focal abnormality. Adrenals/Urinary Tract: Adrenal glands are unremarkable. Kidneys are normal, without renal calculi, focal mass, or hydronephrosis. Bladder is decompressed by Foley catheter and demonstrates mild wall thickening. Stomach/Bowel: Stomach is within normal limits. Appendix not visualized, but there is no RIGHT lower quadrant inflammation. No evidence of bowel wall thickening, distention, or inflammatory changes. Vascular/Lymphatic: Aortic atherosclerosis. No enlarged abdominal or pelvic lymph nodes. Reproductive: Multiple prostatic seeds, reflecting cancer treatment. Prostate size is not increased. Other: No abdominal wall hernia or abnormality. No abdominopelvic ascites. Musculoskeletal: Lumbar spondylosis. Chronic T12 compression fracture. There is a shallow LEFT sacrococcygeal decubitus ulcer, 6 x 22 mm cross-section, series 2, image 85, without significant deep inflammation or abscess. There is no evidence for sacral osteomyelitis.  IMPRESSION: Shallow sacral decubitus ulcer, without significant surrounding inflammation, abscess, or osteomyelitis. No acute intra-abdominal or pelvic findings. Aortic Atherosclerosis (ICD10-I70.0). Electronically Signed   By: Staci Righter M.D.   On: 04/03/2018 19:22    ____________________________________________   PROCEDURES  Procedure(s) performed:   Procedures  None ____________________________________________   INITIAL IMPRESSION / ASSESSMENT AND PLAN / ED COURSE  Pertinent labs & imaging results that were available during my care of the patient were reviewed by me and considered in my medical decision making (see chart for details).  Patient presents to the emergency department for evaluation of a sacral wound.  The wound over the sacrum has surrounding erythema and a central area which is extending into the subcutaneous tissues.  I have no obvious findings on external exam  to suggest abscess but do question possible osteomyelitis especially with nonspecific symptoms of decreased appetite.  Plan for CT imaging of the pelvis to rule out abscess or osteomyelitis.  Will obtain baseline labs and urinalysis.   CT resulted with no evidence of abscess or osteomyelitis in the pelvis. Placed ambulatory referral to outpatient wound care for continued mgmt and debridement as an outpatient. UA does show some concern for UTI. Patient with some UTI symptoms and WBC count of > 15 so will treat. Patient given Rocephin here and will be discharge home on Keflex. Patient to be transported back to nursing facility with clear written instructions and referral information for staff.   ____________________________________________  FINAL CLINICAL IMPRESSION(S) / ED DIAGNOSES  Final diagnoses:  Pressure injury of skin of sacral region, unspecified injury stage  Acute cystitis without hematuria     MEDICATIONS GIVEN DURING THIS VISIT:  Medications  iopamidol (ISOVUE-300) 61 % injection (has no administration in time range)  sodium chloride 0.9 % bolus 500 mL (0 mLs Intravenous Stopped 04/03/18 1819)  iopamidol (ISOVUE-300) 61 % injection 100 mL (100 mLs Intravenous Contrast Given 04/03/18 1858)  cefTRIAXone (ROCEPHIN) 1 g in sodium chloride 0.9 % 100 mL IVPB (0 g Intravenous Stopped 04/03/18 2024)     NEW OUTPATIENT MEDICATIONS STARTED DURING THIS VISIT:  Discharge Medication List as of 04/03/2018  8:22 PM    START taking these medications   Details  cephALEXin (KEFLEX) 500 MG capsule Take 1 capsule (500 mg total) by mouth 3 (three) times daily for 7 days., Starting Fri 04/03/2018, Until Fri 04/10/2018, Print        Note:  This document was prepared using Dragon voice recognition software and may include unintentional dictation errors.  Nanda Quinton, MD Emergency Medicine     , W[facility], MD 04/03/18 [phone]

## 2018-04-03 NOTE — ED Triage Notes (Signed)
Pt bib EMS and coming from [facility] ([place]).  Staff reported to EMS that the "doctor wanted the hospital to look at pt's sore."  Pt has a wound on the top of his sacrum. Staff states he has not been eating well but that is his normal.  Pt a/o x 4. Pt hard of hearing.  Pt has a foley catheter.   EMS VS: BP: 102/58, HR: 70 IR (hx Afib), RR: 18, 93% RA but EMS put on on 2L [place]. CBG: 105

## 2018-04-03 NOTE — Discharge Instructions (Signed)
You were seen in the ED today for evaluation of a sacral wound. The CT scan did not show any abscess or bone infection. You will need to call the wound care center to schedule an appointment. I am placing a referral to expedite your appointment. I am also sending you home with treatment for a possible urine infection. This could be normal bacteria from your urine catheter but your white blood cell count is elevated so we will start antibiotics and follow the urine culture results. Follow closely with your PCP and return to the ED with any new or worsening symptoms.

## 2018-04-06 LAB — URINE CULTURE
Culture: >=100000 — AB
SPECIAL REQUESTS: NORMAL

## 2018-04-07 ENCOUNTER — Telehealth: Payer: Self-pay | Admitting: Emergency Medicine

## 2018-04-07 NOTE — Progress Notes (Signed)
ED Antimicrobial Stewardship Positive Culture Follow Up   [name] is an 84 y.o. male who presented to [facility] on 04/03/2018 with a chief complaint of  Chief Complaint  Patient presents with  . Wound Check    Recent Results (from the past 720 hour(s))  Urine culture     Status: Abnormal   Collection Time: 04/03/18  6:19 PM  Result Value Ref Range Status   Specimen Description   Final    URINE, CATHETERIZED Performed at [facility] [address], [place], [place] [address]    Special Requests   Final    Normal Performed at [facility], [place] [address], [place], [place] [address]    Culture >=100,000 COLONIES/mL PSEUDOMONAS AERUGINOSA (A)  Final   Report Status 04/06/2018 FINAL  Final   Organism ID, Bacteria PSEUDOMONAS AERUGINOSA (A)  Final      Susceptibility   Pseudomonas aeruginosa - MIC*    CEFTAZIDIME 2 SENSITIVE Sensitive     CIPROFLOXACIN <=0.25 SENSITIVE Sensitive     GENTAMICIN <=1 SENSITIVE Sensitive     IMIPENEM 1 SENSITIVE Sensitive     PIP/TAZO <=4 SENSITIVE Sensitive     CEFEPIME 2 SENSITIVE Sensitive     * >=100,000 COLONIES/mL PSEUDOMONAS AERUGINOSA    [x]  Treated with cephalexin, organism resistant to prescribed antimicrobial []  Patient discharged originally without antimicrobial agent and treatment is now indicated  New antibiotic prescription: DC cephalexin, exchange foley and repeat culture at SNF. Consider treatment with cefdinir 300mg  PO BID x 10 days  ED Provider: Suella Broad, PA   Dorma Altman, Rande Lawman 04/07/2018, 9:24 AM Clinical Pharmacist Monday - Friday phone -  [phone] Saturday - Sunday phone - [phone]

## 2018-04-07 NOTE — Telephone Encounter (Signed)
Post ED Visit - Positive Culture Follow-up  Culture report reviewed by antimicrobial stewardship pharmacist:  []  Elenor Quinones, Pharm.D. []  Heide Guile, Pharm.D., BCPS AQ-ID []  [place]ks Neptune, Pharm.D., BCPS []  Alycia Rossetti, Pharm.D., BCPS []  [place], [place].D., BCPS, AAHIVP []  Legrand Como, Pharm.D., BCPS, AAHIVP [x]  Salome Arnt, PharmD, BCPS []  Johnnette Gourd, PharmD, BCPS []  Hughes Better, PharmD, BCPS []  Leeroy Cha, PharmD  Positive urine culture Treated with cephalexin, organism sensitive to the same and no further patient follow-up is required at this time. , culture report faxed to [facility] per request  Hazle Nordmann 04/07/2018, 2:24 PM

## 2018-04-09 ENCOUNTER — Other Ambulatory Visit (HOSPITAL_COMMUNITY)
Admission: RE | Admit: 2018-04-09 | Discharge: 2018-04-09 | Disposition: A | Discharge status: Home / Self Care | Payer: Medicare HMO | Source: Other Acute Inpatient Hospital | Attending: Family Medicine | Admitting: Family Medicine

## 2018-04-09 DIAGNOSIS — R339 Retention of urine, unspecified: Secondary | ICD-10-CM | POA: Insufficient documentation

## 2018-04-09 DIAGNOSIS — N390 Urinary tract infection, site not specified: Secondary | ICD-10-CM | POA: Insufficient documentation

## 2018-04-09 LAB — URINALYSIS, COMPLETE (UACMP) WITH MICROSCOPIC
Bilirubin Urine: NEGATIVE
GLUCOSE, UA: NEGATIVE mg/dL
Ketones, ur: NEGATIVE mg/dL
NITRITE: NEGATIVE
PH: 6 (ref 5.0–8.0)
Protein, ur: 30 mg/dL — AB
RBC / HPF: >50 RBC/hpf — ABNORMAL HIGH (ref 0–5)
SPECIFIC GRAVITY, URINE: 1.009 (ref 1.005–1.030)

## 2018-04-13 LAB — URINE CULTURE

## 2018-04-15 ENCOUNTER — Ambulatory Visit: Payer: Medicare HMO | Admitting: Adult Health

## 2018-04-20 ENCOUNTER — Emergency Department (HOSPITAL_COMMUNITY)
Admission: EM | Admit: 2018-04-20 | Discharge: 2018-04-20 | Disposition: A | Discharge status: Home / Self Care | Payer: Medicare HMO | Source: Home / Self Care | Attending: Emergency Medicine | Admitting: Emergency Medicine

## 2018-04-20 ENCOUNTER — Encounter (HOSPITAL_COMMUNITY): Payer: Self-pay

## 2018-04-20 ENCOUNTER — Other Ambulatory Visit: Payer: Self-pay

## 2018-04-20 ENCOUNTER — Emergency Department (HOSPITAL_COMMUNITY): Payer: Medicare HMO

## 2018-04-20 DIAGNOSIS — R410 Disorientation, unspecified: Secondary | ICD-10-CM | POA: Diagnosis not present

## 2018-04-20 DIAGNOSIS — W19XXXA Unspecified fall, initial encounter: Secondary | ICD-10-CM

## 2018-04-20 DIAGNOSIS — Z79899 Other long term (current) drug therapy: Secondary | ICD-10-CM | POA: Insufficient documentation

## 2018-04-20 DIAGNOSIS — I130 Hypertensive heart and chronic kidney disease with heart failure and stage 1 through stage 4 chronic kidney disease, or unspecified chronic kidney disease: Secondary | ICD-10-CM | POA: Insufficient documentation

## 2018-04-20 DIAGNOSIS — S51011A Laceration without foreign body of right elbow, initial encounter: Secondary | ICD-10-CM

## 2018-04-20 DIAGNOSIS — Y92122 Bedroom in nursing home as the place of occurrence of the external cause: Secondary | ICD-10-CM | POA: Insufficient documentation

## 2018-04-20 DIAGNOSIS — I5022 Chronic systolic (congestive) heart failure: Secondary | ICD-10-CM

## 2018-04-20 DIAGNOSIS — N183 Chronic kidney disease, stage 3 (moderate): Secondary | ICD-10-CM | POA: Insufficient documentation

## 2018-04-20 DIAGNOSIS — S0990XA Unspecified injury of head, initial encounter: Secondary | ICD-10-CM

## 2018-04-20 DIAGNOSIS — Z87891 Personal history of nicotine dependence: Secondary | ICD-10-CM | POA: Insufficient documentation

## 2018-04-20 DIAGNOSIS — Y999 Unspecified external cause status: Secondary | ICD-10-CM | POA: Insufficient documentation

## 2018-04-20 DIAGNOSIS — Z7982 Long term (current) use of aspirin: Secondary | ICD-10-CM | POA: Insufficient documentation

## 2018-04-20 DIAGNOSIS — Y9389 Activity, other specified: Secondary | ICD-10-CM | POA: Insufficient documentation

## 2018-04-20 DIAGNOSIS — W01198A Fall on same level from slipping, tripping and stumbling with subsequent striking against other object, initial encounter: Secondary | ICD-10-CM | POA: Insufficient documentation

## 2018-04-20 DIAGNOSIS — T83518A Infection and inflammatory reaction due to other urinary catheter, initial encounter: Secondary | ICD-10-CM | POA: Diagnosis not present

## 2018-04-20 NOTE — ED Notes (Signed)
Patient transported to ct scan

## 2018-04-20 NOTE — ED Triage Notes (Signed)
Patient BIB EMS from [facility] with complaints of unwitnessed fall. Patient reports he was going to call his wife when he tripped over the phone cord and fell, hitting the left side of his forehead on the nightstand. Patient denies LOC. Patient has history of Alzheimers, but is currently AxOx4. Per Ems report, patient is not taking any blood thinners. Patient complains of 1 of 10 pain to his head. Patient lives on [facility] for his COPD and CHF.

## 2018-04-20 NOTE — ED Notes (Signed)
Bed: [facility] Expected date:  Expected time:  Means of arrival:  Comments: 84 yo male, fall,

## 2018-04-20 NOTE — ED Notes (Signed)
Wound cleaned with soap and water and patted dry. Telfa guaze and mepore tape applied to patient wound.

## 2018-04-20 NOTE — ED Notes (Signed)
[facility] called for patient transport back to [facility] [address].

## 2018-04-20 NOTE — ED Provider Notes (Signed)
Echo DEPT Provider Note   CSN: [id number] Arrival date & time: 04/20/18  1325     History   Chief Complaint Chief Complaint  Patient presents with  . Fall    HPI [name] is a 84 y.o. male with multiple medical problems presents with head injury and fall. He resides at a SNF. He states that he got up to go to the bathroom and his legs gave out on him due to his chronic weakness. He fell and hit his head and hurt his R elbow and his L shoulder. He states he "saw stars" but did not pass out. He denies any pain currently. He is not on blood thinners.  HPI  Past Medical History:  Diagnosis Date  . A-fib ([place])   . AKI (acute kidney injury) ([place])   . Arthritis   . Benign hypertension with chronic kidney disease, stage III ([place]) 11/28/2015  . Bladder cancer ([place])   . CAD (coronary artery disease) 05/31/2016   cath 02/28/11: dLM 10%, pLAD 20%, pRCA calcified nodule 50% (FFR 0.86 - not significant), mRCA 40-50%, mildly elevated filling pressures, EF 50-55%  . Cataract   . Chronic kidney disease   . CKD (chronic kidney disease) stage 3, GFR 30-59 ml/min (HCC)   . CKD (chronic kidney disease), stage III ([place]) 11/28/2015   follows up with Dr. Clydell Hakim Last creatinine 1.36, EGFR 50  . Dyslipidemia   . First degree AV block 06/28/2013  . Gait abnormality 05/26/2017  . Glaucoma   . Hard of hearing   . Hyperlipidemia 11/28/2015   Last Assessment & Plan: Lipids were at goal a year ago; continue statin  . Hypertension    LOV with EKG 06/11/11 Dr Caryl Comes  [facility]  . Malignant neoplasm of prostate [facility])    surgery and radiation  . Malignant neoplasm of urinary bladder ([place]) 07/01/2012   surgery and chemo  . Memory difficulty 05/26/2017  . NSVT (nonsustained ventricular tachycardia) ([place]) 11/28/2015   Overview: Overview: Overview: echo 6/12: EF 55%, basal inferolat AK, mild LVH Last Assessment & Plan: resolved Overview: echo 6/12: EF 55%, basal inferolat  AK, mild LVH Last Assessment & Plan: resolved  . Paroxysmal ventricular tachycardia ([place]) 05/31/2016   Overview: Overview: echo 6/12: EF 55%, basal inferolat AK, mild LVH Last Assessment & Plan: resolved  . Prostate cancer ([place])    status post brachytherapy---in 2003  . PVC (premature ventricular contraction)   . Sleep apnea    STOP BANG SCORE 4  . Ventricular bigeminy 05/31/2016   Overview: Overview: rhythmol Date PR QRS 6/12 23 088 10/13 24 110 10/14 31 109 Last Assessment & Plan: The patient is without symptomatic oral echocardiographic ectopy. His exercise tolerance is quite good. I am concerned about the interval prolongation of his PR interval. We will follow this closely. It is potentially related to the presence of his 1C antiarrhythmic and we may need to discontinue  . Ventricular tachycardia, nonsustained/bigeminy    echo 6/12: EF 55%, basal inferolat AK, mild LVH    Patient Active Problem List   Diagnosis Date Noted  . E. coli UTI (urinary tract infection) 03/10/2018  . Acute metabolic encephalopathy 16/38/4665  . Acute kidney injury ([place]) 03/10/2018  . Chronic systolic (congestive) heart failure ([place]) 03/10/2018  . Pneumonia 02/24/2018  . Pleural effusion on right 12/05/2017  . Acute respiratory failure with hypoxia ([place]) 12/05/2017  . Memory difficulty 05/26/2017  . Gait abnormality 05/26/2017  . Constipation 01/03/2017  .  Atrial fibrillation ([place]) 01/03/2017  . Hyponatremia 01/03/2017  . First degree AV block 06/28/2013  . Bladder cancer ([place]) 07/01/2012  . Cataracts, bilateral 03/14/2011  . Ventricular bigeminy   . Nonsustained ventricular tachycardia ([place])   . Nonobstructive CAD (coronary artery disease)   . Hypertension   . Hyperlipidemia     Past Surgical History:  Procedure Laterality Date  . APPENDECTOMY  1951  . BLADDER SURGERY  2013   followed by chemo  . CARDIAC CATHETERIZATION  6/12  . CATARACT EXTRACTION W/ INTRAOCULAR LENS IMPLANT Right 2016    Right eye [facility] Dr. Lennox Pippins  . CATARACT EXTRACTION W/ INTRAOCULAR LENS IMPLANT Left 2014  . CORONARY ANGIOPLASTY     no significant blockage requiring stenting per patient  . CYSTOSCOPY  05/20/2012   Procedure: CYSTOSCOPY;  Surgeon: Alexis Frock, MD;  Location: [facility];  Service: Urology;  Laterality: N/A;  . CYSTOSCOPY  07/01/2012   Procedure: CYSTOSCOPY;  Surgeon: Molli Hazard, MD;  Location: [facility];  Service: Urology;  Laterality: N/A;  . DESCEMETS STRIPPING AUTOMATED ENDOTHELIAL KERATOPLASTY Right 06/13/2016   Procedure: DESCEMETS STRIPPING ENDOTHELIAL KERATOPLASTY; Surgeon: Marilynne Halsted, MD; Location: [facility]IN OR; Service: Ophthalmology; Laterality: Right;  . DESCEMETS STRIPPING AUTOMATED ENDOTHELIAL KERATOPLASTY Right 11/14/2016   Procedure: DESCEMETS STRIPPING ENDOTHELIAL KERATOPLASTY; Surgeon: Marilynne Halsted, MD; Location: [facility]; Service: Ophthalmology; Laterality: Right;  . EYE SURGERY     left cataract extraction with IOL  . PROSTATE SURGERY  2003   followed by radiation  . SKIN BIOPSY     BCC excised   . SKIN CANCER EXCISION     x 4  . TRANSURETHRAL RESECTION OF BLADDER TUMOR  05/20/2012   Procedure: TRANSURETHRAL RESECTION OF BLADDER TUMOR (TURBT);  Surgeon: Alexis Frock, MD;  Location: [facility];  Service: Urology;  Laterality: N/A;  [place]   . TRANSURETHRAL RESECTION OF BLADDER TUMOR  07/01/2012   Procedure: TRANSURETHRAL RESECTION OF BLADDER TUMOR (TURBT);  Surgeon: Molli Hazard, MD;  Location: [facility];  Service: Urology;  Laterality: N/A;  Cystoscopy, TURBT, be prepared for Open Cystorraphy            Home Medications    Prior to Admission medications   Medication Sig Start Date End Date Taking? Authorizing Provider  acetaminophen (TYLENOL) 500 MG tablet Take 500 mg by mouth every 6 (six) hours as needed for mild pain.   Yes [provider]  aspirin EC 81 MG tablet Take 81 mg by mouth daily.    Yes [provider]  atorvastatin (LIPITOR) 20 MG tablet Take 20 mg by mouth every evening.    Yes [provider]  furosemide (LASIX) 20 MG tablet Take 1 tablet (20 mg total) by mouth daily. 02/28/18  Yes Patrecia Pour, Christean Grief, MD  hydrOXYzine (ATARAX/VISTARIL) 25 MG tablet Take 25 mg by mouth 3 (three) times daily.   Yes [provider]  prednisoLONE acetate (PRED FORTE) 1 % ophthalmic suspension Place 2 drops into both eyes 2 (two) times daily as needed (allergies).    Yes [provider]    Family History Family History  Problem Relation Age of Onset  . Hypertension Mother   . Arthritis Son   . Heart Problems Son        Aortic valve replacement  . Lung cancer Daughter   . Heart attack Neg Hx   . Stroke Neg Hx     Social History Social History   Tobacco Use  . Smoking status:  Former Smoker    Types: Cigars    Last attempt to quit: 05/20/1995    Years since quitting: 22.9  . Smokeless tobacco: Current User    Types: Chew  Substance Use Topics  . Alcohol use: Not Currently  . Drug use: No     Allergies   Neosporin [neomycin-bacitracin zn-polymyx]; Penicillins; and Penicillin g   Review of Systems Review of Systems  Respiratory: Negative for shortness of breath.   Cardiovascular: Negative for leg swelling.  Gastrointestinal: Negative for abdominal pain.  Musculoskeletal: Positive for arthralgias. Negative for back pain and neck pain.  Skin: Positive for wound.  Neurological: Negative for dizziness, syncope and headaches.  All other systems reviewed and are negative.    Physical Exam Updated Vital Signs BP 134/87 (BP Location: Right Arm)   Pulse 68   Temp 97.9 F (36.6 C) (Oral)   Resp 18   Wt 93 kg (205 lb)   SpO2 100%   BMI 29.41 kg/m   Physical Exam  Constitutional: He is oriented to person, place, and time. He appears well-developed and well-nourished. No distress.  Calm, cooperative. Sleeping but easily aroused. Hard of hearing. On  2L O2 via [place]  HENT:  Head: Normocephalic.  Abrasion over the top of the left side of head  Eyes: Pupils are equal, round, and reactive to light. Conjunctivae are normal. Right eye exhibits no discharge. Left eye exhibits no discharge. No scleral icterus.  Neck: Normal range of motion.  Cardiovascular: Normal rate and regular rhythm.  Pulmonary/Chest: Effort normal and breath sounds normal. No respiratory distress.  Abdominal: Soft. Bowel sounds are normal. He exhibits no distension. There is no tenderness.  Musculoskeletal:  FROM of bilateral shoulders, elbow, and equal grip strength. Skin tear over right elbow.  FROM of bilateral hips and knees.  No back tenderness  Neurological: He is alert and oriented to person, place, and time.  Skin: Skin is warm and dry.  Psychiatric: He has a normal mood and affect. His behavior is normal.  Nursing note and vitals reviewed.    ED Treatments / Results  Labs (all labs ordered are listed, but only abnormal results are displayed) Labs Reviewed - No data to display  EKG None  Radiology Dg Elbow Complete Right  Result Date: 04/20/2018 CLINICAL DATA:  Right elbow pain and laceration after fall. EXAM: RIGHT ELBOW - COMPLETE 3+ VIEW COMPARISON:  None. FINDINGS: There is no evidence of fracture, dislocation, or joint effusion. There is no evidence of arthropathy or other focal bone abnormality. Enthesopathy at the olecranon and bilateral epicondyles. Soft tissues are unremarkable. IMPRESSION: No acute osseous abnormality. Electronically Signed   By: Titus Dubin M.D.   On: 04/20/2018 15:33   Ct Head Wo Contrast  Result Date: 04/20/2018 CLINICAL DATA:  Fall with head trauma and neck pain EXAM: CT HEAD WITHOUT CONTRAST CT CERVICAL SPINE WITHOUT CONTRAST TECHNIQUE: Multidetector CT imaging of the head and cervical spine was performed following the standard protocol without intravenous contrast. Multiplanar CT image reconstructions of the cervical  spine were also generated. COMPARISON:  None. FINDINGS: CT HEAD FINDINGS Brain: There is no mass, hemorrhage or extra-axial collection. There is generalized atrophy without lobar predilection. There is no acute or chronic infarction. There is hypoattenuation of the periventricular white matter, most commonly indicating chronic ischemic microangiopathy. Vascular: No abnormal hyperdensity of the major intracranial arteries or dural venous sinuses. No intracranial atherosclerosis. Skull: The visualized skull base, calvarium and extracranial soft tissues are normal. Sinuses/Orbits: No  fluid levels or advanced mucosal thickening of the visualized paranasal sinuses. No mastoid or middle ear effusion. The orbits are normal. CT CERVICAL SPINE FINDINGS Alignment: No static subluxation. Facets are aligned. Occipital condyles are normally positioned. Skull base and vertebrae: No acute fracture. Soft tissues and spinal canal: No prevertebral fluid or swelling. No visible canal hematoma. Disc levels: Severe multilevel uncovertebral and facet hypertrophy that causes moderate-to-severe neural foraminal stenosis at multiple levels, worst at left C3-4, right C4-5 and left C6-7. Upper chest: No pneumothorax, pulmonary nodule or pleural effusion. Other: Normal visualized paraspinal cervical soft tissues. IMPRESSION: 1. No acute intracranial abnormality or cervical spine fracture. 2. Chronic small vessel ischemia and mild generalized atrophy. 3. Multilevel cervical facet and uncovertebral hypertrophy resulting in severe neural foraminal stenosis at multiple levels. Electronically Signed   By: Ulyses Jarred M.D.   On: 04/20/2018 15:43   Ct Cervical Spine Wo Contrast  Result Date: 04/20/2018 CLINICAL DATA:  Fall with head trauma and neck pain EXAM: CT HEAD WITHOUT CONTRAST CT CERVICAL SPINE WITHOUT CONTRAST TECHNIQUE: Multidetector CT imaging of the head and cervical spine was performed following the standard protocol without  intravenous contrast. Multiplanar CT image reconstructions of the cervical spine were also generated. COMPARISON:  None. FINDINGS: CT HEAD FINDINGS Brain: There is no mass, hemorrhage or extra-axial collection. There is generalized atrophy without lobar predilection. There is no acute or chronic infarction. There is hypoattenuation of the periventricular white matter, most commonly indicating chronic ischemic microangiopathy. Vascular: No abnormal hyperdensity of the major intracranial arteries or dural venous sinuses. No intracranial atherosclerosis. Skull: The visualized skull base, calvarium and extracranial soft tissues are normal. Sinuses/Orbits: No fluid levels or advanced mucosal thickening of the visualized paranasal sinuses. No mastoid or middle ear effusion. The orbits are normal. CT CERVICAL SPINE FINDINGS Alignment: No static subluxation. Facets are aligned. Occipital condyles are normally positioned. Skull base and vertebrae: No acute fracture. Soft tissues and spinal canal: No prevertebral fluid or swelling. No visible canal hematoma. Disc levels: Severe multilevel uncovertebral and facet hypertrophy that causes moderate-to-severe neural foraminal stenosis at multiple levels, worst at left C3-4, right C4-5 and left C6-7. Upper chest: No pneumothorax, pulmonary nodule or pleural effusion. Other: Normal visualized paraspinal cervical soft tissues. IMPRESSION: 1. No acute intracranial abnormality or cervical spine fracture. 2. Chronic small vessel ischemia and mild generalized atrophy. 3. Multilevel cervical facet and uncovertebral hypertrophy resulting in severe neural foraminal stenosis at multiple levels. Electronically Signed   By: Ulyses Jarred M.D.   On: 04/20/2018 15:43   Dg Shoulder Left  Result Date: 04/20/2018 CLINICAL DATA:  Left shoulder pain after fall. EXAM: LEFT SHOULDER - 2+ VIEW COMPARISON:  None. FINDINGS: No acute fracture or dislocation. Mild acromioclavicular and glenohumeral  degenerative changes. Small calcifications near the lesser and greater tuberosities. Bone mineralization is normal. Soft tissues are unremarkable. IMPRESSION: 1.  No acute osseous abnormality. 2. Mild osteoarthritis. 3. Small calcifications near the lesser and greater tuberosities may reflect supraspinatus and subscapularis calcific tendinitis. Electronically Signed   By: Titus Dubin M.D.   On: 04/20/2018 15:37    Procedures Procedures (including critical care time)  Medications Ordered in ED Medications - No data to display   Initial Impression / Assessment and Plan / ED Course  I have reviewed the triage vital signs and the nursing notes.  Pertinent labs & imaging results that were available during my care of the patient were reviewed by me and considered in my medical decision making (see chart  for details).  84 year old male with mechanical fall and minor head injury. Exam is remarkable for minor abrasions/skin tears on the head and R elbow. His vitals are normal. He has no pain. CT head/C-spine are negative. L shoulder and R elbow xray are negative. Wound care was performed by nursing staff. Shared visit with Dr. Eulis Foster. Will d/c.  Final Clinical Impressions(s) / ED Diagnoses   Final diagnoses:  Fall, initial encounter  Injury of head, initial encounter  Skin tear of right elbow without complication, initial encounter    ED Discharge Orders    None       Recardo Evangelist, PA-C 04/20/18 1621    Daleen Bo, MD 04/24/18 1000

## 2018-04-20 NOTE — ED Provider Notes (Signed)
  Face-to-face evaluation   History: Fall while walking today. He c/o bumps and bruies. No vomiting or dizziness  Physical exam: Elderly, frail. Abrasions left forehead and right elbow with mild bleeding. Normal ROM arms and legs.  Medical screening examination/treatment/procedure(s) were conducted as a shared visit with non-physician practitioner(s) and myself.  I personally evaluated the patient during the encounter    Daleen Bo, MD 04/24/18 1000

## 2018-04-22 ENCOUNTER — Inpatient Hospital Stay (HOSPITAL_COMMUNITY)
Admission: EM | Admit: 2018-04-22 | Discharge: 2018-04-25 | DRG: 698 | Disposition: A | Discharge status: Home Health | Payer: Medicare HMO | Source: Skilled Nursing Facility | Attending: Family Medicine | Admitting: Family Medicine

## 2018-04-22 ENCOUNTER — Emergency Department (HOSPITAL_COMMUNITY): Payer: Medicare HMO

## 2018-04-22 DIAGNOSIS — N183 Chronic kidney disease, stage 3 (moderate): Secondary | ICD-10-CM | POA: Diagnosis present

## 2018-04-22 DIAGNOSIS — G4730 Sleep apnea, unspecified: Secondary | ICD-10-CM | POA: Diagnosis present

## 2018-04-22 DIAGNOSIS — I480 Paroxysmal atrial fibrillation: Secondary | ICD-10-CM | POA: Diagnosis not present

## 2018-04-22 DIAGNOSIS — I130 Hypertensive heart and chronic kidney disease with heart failure and stage 1 through stage 4 chronic kidney disease, or unspecified chronic kidney disease: Secondary | ICD-10-CM | POA: Diagnosis present

## 2018-04-22 DIAGNOSIS — L89150 Pressure ulcer of sacral region, unstageable: Secondary | ICD-10-CM | POA: Diagnosis not present

## 2018-04-22 DIAGNOSIS — T83511A Infection and inflammatory reaction due to indwelling urethral catheter, initial encounter: Secondary | ICD-10-CM | POA: Diagnosis not present

## 2018-04-22 DIAGNOSIS — J449 Chronic obstructive pulmonary disease, unspecified: Secondary | ICD-10-CM | POA: Diagnosis present

## 2018-04-22 DIAGNOSIS — Z9981 Dependence on supplemental oxygen: Secondary | ICD-10-CM | POA: Diagnosis not present

## 2018-04-22 DIAGNOSIS — I5022 Chronic systolic (congestive) heart failure: Secondary | ICD-10-CM | POA: Diagnosis present

## 2018-04-22 DIAGNOSIS — I4891 Unspecified atrial fibrillation: Secondary | ICD-10-CM | POA: Diagnosis present

## 2018-04-22 DIAGNOSIS — G9341 Metabolic encephalopathy: Secondary | ICD-10-CM | POA: Diagnosis present

## 2018-04-22 DIAGNOSIS — T83518A Infection and inflammatory reaction due to other urinary catheter, initial encounter: Principal | ICD-10-CM | POA: Diagnosis present

## 2018-04-22 DIAGNOSIS — L89154 Pressure ulcer of sacral region, stage 4: Secondary | ICD-10-CM

## 2018-04-22 DIAGNOSIS — A499 Bacterial infection, unspecified: Secondary | ICD-10-CM | POA: Diagnosis not present

## 2018-04-22 DIAGNOSIS — H409 Unspecified glaucoma: Secondary | ICD-10-CM | POA: Diagnosis present

## 2018-04-22 DIAGNOSIS — I2510 Atherosclerotic heart disease of native coronary artery without angina pectoris: Secondary | ICD-10-CM | POA: Diagnosis present

## 2018-04-22 DIAGNOSIS — Z8546 Personal history of malignant neoplasm of prostate: Secondary | ICD-10-CM | POA: Diagnosis not present

## 2018-04-22 DIAGNOSIS — R410 Disorientation, unspecified: Secondary | ICD-10-CM | POA: Diagnosis present

## 2018-04-22 DIAGNOSIS — F17220 Nicotine dependence, chewing tobacco, uncomplicated: Secondary | ICD-10-CM | POA: Diagnosis present

## 2018-04-22 DIAGNOSIS — H9190 Unspecified hearing loss, unspecified ear: Secondary | ICD-10-CM | POA: Diagnosis present

## 2018-04-22 DIAGNOSIS — Z7982 Long term (current) use of aspirin: Secondary | ICD-10-CM | POA: Diagnosis not present

## 2018-04-22 DIAGNOSIS — I10 Essential (primary) hypertension: Secondary | ICD-10-CM | POA: Diagnosis present

## 2018-04-22 DIAGNOSIS — B965 Pseudomonas (aeruginosa) (mallei) (pseudomallei) as the cause of diseases classified elsewhere: Secondary | ICD-10-CM | POA: Diagnosis present

## 2018-04-22 DIAGNOSIS — Z8551 Personal history of malignant neoplasm of bladder: Secondary | ICD-10-CM | POA: Diagnosis not present

## 2018-04-22 DIAGNOSIS — Y846 Urinary catheterization as the cause of abnormal reaction of the patient, or of later complication, without mention of misadventure at the time of the procedure: Secondary | ICD-10-CM | POA: Diagnosis present

## 2018-04-22 DIAGNOSIS — I959 Hypotension, unspecified: Secondary | ICD-10-CM | POA: Diagnosis not present

## 2018-04-22 DIAGNOSIS — J9611 Chronic respiratory failure with hypoxia: Secondary | ICD-10-CM | POA: Diagnosis present

## 2018-04-22 DIAGNOSIS — B964 Proteus (mirabilis) (morganii) as the cause of diseases classified elsewhere: Secondary | ICD-10-CM | POA: Diagnosis present

## 2018-04-22 DIAGNOSIS — I471 Supraventricular tachycardia: Secondary | ICD-10-CM | POA: Diagnosis present

## 2018-04-22 DIAGNOSIS — Z9861 Coronary angioplasty status: Secondary | ICD-10-CM

## 2018-04-22 DIAGNOSIS — G92 Toxic encephalopathy: Secondary | ICD-10-CM | POA: Diagnosis present

## 2018-04-22 DIAGNOSIS — E785 Hyperlipidemia, unspecified: Secondary | ICD-10-CM | POA: Diagnosis present

## 2018-04-22 DIAGNOSIS — Z79899 Other long term (current) drug therapy: Secondary | ICD-10-CM

## 2018-04-22 DIAGNOSIS — N390 Urinary tract infection, site not specified: Secondary | ICD-10-CM | POA: Diagnosis present

## 2018-04-22 DIAGNOSIS — L8990 Pressure ulcer of unspecified site, unspecified stage: Secondary | ICD-10-CM

## 2018-04-22 DIAGNOSIS — R296 Repeated falls: Secondary | ICD-10-CM | POA: Diagnosis present

## 2018-04-22 DIAGNOSIS — F0390 Unspecified dementia without behavioral disturbance: Secondary | ICD-10-CM | POA: Diagnosis present

## 2018-04-22 LAB — CBC
HEMATOCRIT: 39.9 % (ref 39.0–52.0)
HEMOGLOBIN: 12.7 g/dL — AB (ref 13.0–17.0)
MCH: 30.2 pg (ref 26.0–34.0)
MCHC: 31.8 g/dL (ref 30.0–36.0)
MCV: 95 fL (ref 78.0–100.0)
Platelets: 297 10*3/uL (ref 150–400)
RBC: 4.2 MIL/uL — AB (ref 4.22–5.81)
RDW: 12.7 % (ref 11.5–15.5)
WBC: 13.4 10*3/uL — ABNORMAL HIGH (ref 4.0–10.5)

## 2018-04-22 LAB — CBC WITH DIFFERENTIAL/PLATELET
Abs Immature Granulocytes: 0.1 10*3/uL (ref 0.0–0.1)
Basophils Absolute: 0.1 10*3/uL (ref 0.0–0.1)
Basophils Relative: 1 %
Eosinophils Absolute: 0.5 10*3/uL (ref 0.0–0.7)
Eosinophils Relative: 4 %
HCT: 41 % (ref 39.0–52.0)
Hemoglobin: 13.2 g/dL (ref 13.0–17.0)
Immature Granulocytes: 1 %
Lymphocytes Relative: 17 %
Lymphs Abs: 2.5 10*3/uL (ref 0.7–4.0)
MCH: 30 pg (ref 26.0–34.0)
MCHC: 32.2 g/dL (ref 30.0–36.0)
MCV: 93.2 fL (ref 78.0–100.0)
Monocytes Absolute: 1.5 10*3/uL — ABNORMAL HIGH (ref 0.1–1.0)
Monocytes Relative: 10 %
Neutro Abs: 10 10*3/uL — ABNORMAL HIGH (ref 1.7–7.7)
Neutrophils Relative %: 67 %
Platelets: 295 10*3/uL (ref 150–400)
RBC: 4.4 MIL/uL (ref 4.22–5.81)
RDW: 12.4 % (ref 11.5–15.5)
WBC: 14.7 10*3/uL — ABNORMAL HIGH (ref 4.0–10.5)

## 2018-04-22 LAB — URINALYSIS, ROUTINE W REFLEX MICROSCOPIC
Bilirubin Urine: NEGATIVE
Glucose, UA: NEGATIVE mg/dL
Ketones, ur: NEGATIVE mg/dL
Nitrite: POSITIVE — AB
Protein, ur: 30 mg/dL — AB
Specific Gravity, Urine: 1.006 (ref 1.005–1.030)
pH: 9 — ABNORMAL HIGH (ref 5.0–8.0)

## 2018-04-22 LAB — BASIC METABOLIC PANEL
Anion gap: 12 (ref 5–15)
BUN: 8 mg/dL (ref 8–23)
CO2: 30 mmol/L (ref 22–32)
Calcium: 8.7 mg/dL — ABNORMAL LOW (ref 8.9–10.3)
Chloride: 92 mmol/L — ABNORMAL LOW (ref 98–111)
Creatinine, Ser: 0.99 mg/dL (ref 0.61–1.24)
GFR calc Af Amer: >60 mL/min (ref >60)
GFR calc non Af Amer: >60 mL/min (ref >60)
Glucose, Bld: 124 mg/dL — ABNORMAL HIGH (ref 70–99)
Potassium: 3.8 mmol/L (ref 3.5–5.1)
Sodium: 134 mmol/L — ABNORMAL LOW (ref 135–145)

## 2018-04-22 LAB — CREATININE, SERUM
CREATININE: 0.95 mg/dL (ref 0.61–1.24)
GFR calc Af Amer: >60 mL/min (ref >60)

## 2018-04-22 MED ORDER — SODIUM CHLORIDE 0.9% FLUSH
3.0000 mL | Freq: Two times a day (BID) | INTRAVENOUS | Status: DC
  Administered 2018-04-22 – 2018-04-24 (×3): 3 mL via INTRAVENOUS

## 2018-04-22 MED ORDER — ONDANSETRON HCL 4 MG PO TABS: 4.0000 mg | ORAL_TABLET | Freq: Four times a day (QID) | ORAL | Status: DC | PRN

## 2018-04-22 MED ORDER — HYDROXYZINE HCL 25 MG PO TABS
25.0000 mg | ORAL_TABLET | Freq: Three times a day (TID) | ORAL | Status: DC
  Administered 2018-04-22 – 2018-04-25 (×8): 25 mg via ORAL
  Filled 2018-04-22 (×8): qty 1

## 2018-04-22 MED ORDER — SODIUM CHLORIDE 0.9 % IV BOLUS
250.0000 mL | Freq: Once | INTRAVENOUS | Status: AC
  Administered 2018-04-22: 250 mL via INTRAVENOUS

## 2018-04-22 MED ORDER — ASPIRIN EC 81 MG PO TBEC
81.0000 mg | DELAYED_RELEASE_TABLET | Freq: Every day | ORAL | Status: DC
  Administered 2018-04-23 – 2018-04-25 (×3): 81 mg via ORAL
  Filled 2018-04-22 (×3): qty 1

## 2018-04-22 MED ORDER — ONDANSETRON HCL 4 MG/2ML IJ SOLN: 4.0000 mg | Freq: Four times a day (QID) | INTRAMUSCULAR | Status: DC | PRN

## 2018-04-22 MED ORDER — CEFTRIAXONE SODIUM 1 G IJ SOLR
1.0000 g | Freq: Once | INTRAMUSCULAR | Status: AC
  Administered 2018-04-22: 1 g via INTRAVENOUS
  Filled 2018-04-22: qty 10

## 2018-04-22 MED ORDER — POLYETHYLENE GLYCOL 3350 17 G PO PACK: 17.0000 g | PACK | Freq: Every day | ORAL | Status: DC | PRN

## 2018-04-22 MED ORDER — ATORVASTATIN CALCIUM 20 MG PO TABS
20.0000 mg | ORAL_TABLET | Freq: Every evening | ORAL | Status: DC
  Administered 2018-04-23 – 2018-04-24 (×2): 20 mg via ORAL
  Filled 2018-04-22 (×2): qty 1

## 2018-04-22 MED ORDER — DEXTROSE-NACL 5-0.45 % IV SOLN
INTRAVENOUS | Status: AC
  Administered 2018-04-22: 21:00:00 via INTRAVENOUS

## 2018-04-22 MED ORDER — TRAZODONE HCL 50 MG PO TABS: 50.0000 mg | ORAL_TABLET | Freq: Every evening | ORAL | Status: DC | PRN

## 2018-04-22 MED ORDER — PREDNISOLONE ACETATE 1 % OP SUSP
2.0000 [drp] | Freq: Two times a day (BID) | OPHTHALMIC | Status: DC | PRN
  Filled 2018-04-22: qty 5

## 2018-04-22 MED ORDER — ACETAMINOPHEN 325 MG PO TABS: 650.0000 mg | ORAL_TABLET | Freq: Four times a day (QID) | ORAL | Status: DC | PRN

## 2018-04-22 MED ORDER — HEPARIN SODIUM (PORCINE) 5000 UNIT/ML IJ SOLN
5000.0000 [IU] | Freq: Three times a day (TID) | INTRAMUSCULAR | Status: DC
  Administered 2018-04-22 – 2018-04-25 (×9): 5000 [IU] via SUBCUTANEOUS
  Filled 2018-04-22 (×9): qty 1

## 2018-04-22 MED ORDER — BARRIER CREAM NON-SPECIFIED
1.0000 "application " | TOPICAL_CREAM | Freq: Three times a day (TID) | TOPICAL | Status: DC | PRN
  Filled 2018-04-22: qty 1

## 2018-04-22 MED ORDER — SODIUM CHLORIDE 0.9% FLUSH: 3.0000 mL | INTRAVENOUS | Status: DC | PRN

## 2018-04-22 MED ORDER — ACETAMINOPHEN 650 MG RE SUPP: 650.0000 mg | Freq: Four times a day (QID) | RECTAL | Status: DC | PRN

## 2018-04-22 MED ORDER — ALBUTEROL SULFATE (2.5 MG/3ML) 0.083% IN NEBU
2.5000 mg | INHALATION_SOLUTION | RESPIRATORY_TRACT | Status: DC | PRN
Start: 2018-04-22 — End: 2018-04-25

## 2018-04-22 MED ORDER — ACETAMINOPHEN 500 MG PO TABS: 500.0000 mg | ORAL_TABLET | Freq: Four times a day (QID) | ORAL | Status: DC | PRN

## 2018-04-22 MED ORDER — SODIUM CHLORIDE 0.9 % IV SOLN
1.0000 g | Freq: Two times a day (BID) | INTRAVENOUS | Status: DC
  Administered 2018-04-22 – 2018-04-24 (×4): 1 g via INTRAVENOUS
  Filled 2018-04-22 (×5): qty 1

## 2018-04-22 MED ORDER — SODIUM CHLORIDE 0.9 % IV SOLN: 250.0000 mL | INTRAVENOUS | Status: DC | PRN

## 2018-04-22 NOTE — ED Notes (Signed)
Admitting MD has rounded again on the pt. Pt received 757ml bolus total per verbal order and now has fluids going at 27ml/hr.

## 2018-04-22 NOTE — H&P (Signed)
Patient Demographics:    [name], is a 84 y.o. male  MRN: [id number]   DOB - 10/13/1926  Admit Date - 04/22/2018  Outpatient Primary MD for the patient is Gregor Hams, FNP   Assessment & Plan:    Principal Problem:   UTI (urinary tract infection), bacterial/CAUTI/Pseudomonas Active Problems:   Hypertension   Acute metabolic encephalopathy   Chronic systolic (congestive) heart failure (HCC)   Metabolic encephalopathy  1) Pseudomonas catheter associated UTI with altered mentation----recent urine culture with Pseudomonas, patient appears to have failed oral Cipro, admitted to hospital due to failure of oral antibiotics, treat empirically with IV cefepime pending urine and blood cultures.  Leukocytosis noted . patient apparently has had a Foley catheter for about 4 months (foley has been changed a  few times since) now catheter should be changed during this admission. Pseudomonas UTI related to indwelling catheter in the patient with history of bladder tumor.   2)Acute toxic metabolic encephalopathy--- at baseline patient is usually coherent and conversational according to his wife, today patient is completely disoriented, is not oriented to place, time or date ,cannot answer simple questions.  According to his wife this is very different from his baseline.  CT head unremarkable.  Suspect this is secondary to Pseudomonas UTI.  Treat UTI as above #1  3)H/o Afib and HFrEF--last known EF about 35%, unable to tolerate beta-blockers due to propensity towards significant bradycardia, not a candidate for anticoagulation given recurrent falls.  Continue aspirin.... Hold Lasix as blood pressure is soft,  be judicious with IV fluids to avoid volume overload  4)S/p Fall- ??? Syncope Vs mechanical fall----patient is a poor  historian, CT head and CT C-spine without acute findings, will get PT eval once more stable  With History of - Reviewed by me  Past Medical History:  Diagnosis Date  . A-fib ([place])   . AKI (acute kidney injury) ([place])   . Arthritis   . Benign hypertension with chronic kidney disease, stage III ([place]) 11/28/2015  . Bladder cancer ([place])   . CAD (coronary artery disease) 05/31/2016   cath 02/28/11: dLM 10%, pLAD 20%, pRCA calcified nodule 50% (FFR 0.86 - not significant), mRCA 40-50%, mildly elevated filling pressures, EF 50-55%  . Cataract   . Chronic kidney disease   . CKD (chronic kidney disease) stage 3, GFR 30-59 ml/min (HCC)   . CKD (chronic kidney disease), stage III ([place]) 11/28/2015   follows up with Dr. Clydell Hakim Last creatinine 1.36, EGFR 50  . Dyslipidemia   . First degree AV block 06/28/2013  . Gait abnormality 05/26/2017  . Glaucoma   . Hard of hearing   . Hyperlipidemia 11/28/2015   Last Assessment & Plan: Lipids were at goal a year ago; continue statin  . Hypertension    LOV with EKG 06/11/11 Dr Caryl Comes  [facility]  . Malignant neoplasm of prostate [facility])    surgery and radiation  . Malignant neoplasm of  urinary bladder ([place]) 07/01/2012   surgery and chemo  . Memory difficulty 05/26/2017  . NSVT (nonsustained ventricular tachycardia) ([place]) 11/28/2015   Overview: Overview: Overview: echo 6/12: EF 55%, basal inferolat AK, mild LVH Last Assessment & Plan: resolved Overview: echo 6/12: EF 55%, basal inferolat AK, mild LVH Last Assessment & Plan: resolved  . Paroxysmal ventricular tachycardia ([place]) 05/31/2016   Overview: Overview: echo 6/12: EF 55%, basal inferolat AK, mild LVH Last Assessment & Plan: resolved  . Prostate cancer ([place])    status post brachytherapy---in 2003  . PVC (premature ventricular contraction)   . Sleep apnea    STOP BANG SCORE 4  . Ventricular bigeminy 05/31/2016   Overview: Overview: rhythmol Date PR QRS 6/12 23 088 10/13 24 110 10/14 31 109 Last Assessment &  Plan: The patient is without symptomatic oral echocardiographic ectopy. His exercise tolerance is quite good. I am concerned about the interval prolongation of his PR interval. We will follow this closely. It is potentially related to the presence of his 1C antiarrhythmic and we may need to discontinue  . Ventricular tachycardia, nonsustained/bigeminy    echo 6/12: EF 55%, basal inferolat AK, mild LVH      Past Surgical History:  Procedure Laterality Date  . APPENDECTOMY  1951  . BLADDER SURGERY  2013   followed by chemo  . CARDIAC CATHETERIZATION  6/12  . CATARACT EXTRACTION W/ INTRAOCULAR LENS IMPLANT Right 2016   Right eye [facility] Dr. Lennox Pippins  . CATARACT EXTRACTION W/ INTRAOCULAR LENS IMPLANT Left 2014  . CORONARY ANGIOPLASTY     no significant blockage requiring stenting per patient  . CYSTOSCOPY  05/20/2012   Procedure: CYSTOSCOPY;  Surgeon: Alexis Frock, MD;  Location: [facility];  Service: Urology;  Laterality: N/A;  . CYSTOSCOPY  07/01/2012   Procedure: CYSTOSCOPY;  Surgeon: Molli Hazard, MD;  Location: [facility];  Service: Urology;  Laterality: N/A;  . DESCEMETS STRIPPING AUTOMATED ENDOTHELIAL KERATOPLASTY Right 06/13/2016   Procedure: DESCEMETS STRIPPING ENDOTHELIAL KERATOPLASTY; Surgeon: Marilynne Halsted, MD; Location: [facility]; Service: Ophthalmology; Laterality: Right;  . DESCEMETS STRIPPING AUTOMATED ENDOTHELIAL KERATOPLASTY Right 11/14/2016   Procedure: DESCEMETS STRIPPING ENDOTHELIAL KERATOPLASTY; Surgeon: Marilynne Halsted, MD; Location: [facility]; Service: Ophthalmology; Laterality: Right;  . EYE SURGERY     left cataract extraction with IOL  . PROSTATE SURGERY  2003   followed by radiation  . SKIN BIOPSY     BCC excised   . SKIN CANCER EXCISION     x 4  . TRANSURETHRAL RESECTION OF BLADDER TUMOR  05/20/2012   Procedure: TRANSURETHRAL RESECTION OF BLADDER TUMOR (TURBT);  Surgeon: Alexis Frock, MD;  Location: [facility];  Service: Urology;  Laterality:  N/A;  [place]   . TRANSURETHRAL RESECTION OF BLADDER TUMOR  07/01/2012   Procedure: TRANSURETHRAL RESECTION OF BLADDER TUMOR (TURBT);  Surgeon: Molli Hazard, MD;  Location: [facility];  Service: Urology;  Laterality: N/A;  Cystoscopy, TURBT, be prepared for Open Cystorraphy          Chief Complaint  Patient presents with  . Altered Mental Status  . Fall      HPI:    [name]  is a 84 y.o. male with past medical history relevant for systolic dysfunction CHF, atrial fibrillation, history of bladder cancer with chronic indwelling Foley catheter for the last 4 months, as well as dyslipidemia who presents from the assisted living facility after a fall  Patient with chronic indwelling Foley catheter, no hematuria noted,  overall patient is a very poor historian due to altered mentation/metabolic encephalopathy secondary to presumed UTI  Please note the patient was recently diagnosed with Pseudomonas UTI and treated with Cipro, today UA suggest possible UTI again, patient is very confused and disoriented which is very different from his baseline according to his wife,.  Usually at baseline patient is coherent and can carry on the conversation, today patient is disoriented to place time and date  CT head and CT C-spine done due to fall shows no acute findings  Leukocytosis noted with patient has no fevers no tachycardia no tachypnea   Review of systems:    In addition to the HPI above,    A full Review of  Systems was done, all other systems reviewed are negative except as noted above in HPI , .   Social History:  Reviewed by me    Social History   Tobacco Use  . Smoking status: Former Smoker    Types: Cigars    Last attempt to quit: 05/20/1995    Years since quitting: 22.9  . Smokeless tobacco: Current User    Types: Chew  Substance Use Topics  . Alcohol use: Not Currently     Family History :  Reviewed by me   Family History  Problem Relation Age of Onset    . Hypertension Mother   . Arthritis Son   . Heart Problems Son        Aortic valve replacement  . Lung cancer Daughter   . Heart attack Neg Hx   . Stroke Neg Hx     Home Medications:   Prior to Admission medications   Medication Sig Start Date End Date Taking? Authorizing Provider  acetaminophen (TYLENOL) 500 MG tablet Take 500 mg by mouth every 6 (six) hours as needed for mild pain.   Yes [provider]  aspirin EC 81 MG tablet Take 81 mg by mouth daily.    Yes [provider]  atorvastatin (LIPITOR) 20 MG tablet Take 20 mg by mouth every evening.    Yes [provider]  barrier cream (NON-SPECIFIED) CREA Apply 1 application topically 3 (three) times daily as needed (sacrum and groin redness).   Yes [provider]  ciprofloxacin (CIPRO) 500 MG tablet Take 500 mg by mouth 2 (two) times daily. 04/21/18 04/26/18 Yes [provider]  furosemide (LASIX) 20 MG tablet Take 1 tablet (20 mg total) by mouth daily. 02/28/18  Yes Patrecia Pour, Christean Grief, MD  hydrOXYzine (ATARAX/VISTARIL) 25 MG tablet Take 25 mg by mouth 3 (three) times daily.   Yes [provider]  OXYGEN Inhale 2 L into the lungs continuous.   Yes [provider]  prednisoLONE acetate (PRED FORTE) 1 % ophthalmic suspension Place 2 drops into both eyes 2 (two) times daily as needed (allergies).    Yes [provider]     Allergies:    Allergies  Allergen Reactions  . Neosporin [Neomycin-Bacitracin Zn-Polymyx] Other (See Comments)    Makes his skin red   . Penicillins Hives and Itching    Has patient had a PCN reaction causing immediate rash, facial/tongue/throat swelling, SOB or lightheadedness with hypotension: Yes Has patient had a PCN reaction causing severe rash involving mucus membranes or skin necrosis: No Has patient had a PCN reaction that required hospitalization: Unknown Has patient had a PCN reaction occurring within the last 10 years: Unknown If  all of the above answers are "NO", then may proceed with Cephalosporin use.   Marland Kitchen  Penicillin G Rash    Physical Exam:   Vitals  Blood pressure 116/69, pulse 86, temperature 98.3 F (36.8 C), temperature source Oral, resp. rate (!) 22, SpO2 96 %.  Physical Examination: General appearance -confused and disoriented, in no distress and  Mental status -awake but not coherent Ears-hard of hearing Eyes - sclera anicteric Neck - supple, no JVD elevation , Chest - clear  to auscultation bilaterally, symmetrical air movement,  Heart - S1 and S2 normal, irregular Abdomen - soft, nontender, nondistended, no masses or organomegaly, no CVA area tenderness Neurological -significant cognitive deficits, unable to answer simple questions, neck supple without rigidity, cranial nerves II through XII intact, DTR's normal and symmetric Extremities - no pedal edema noted, intact peripheral pulses  Skin - warm, dry GU-chronic indwelling Foley catheter, no hematuria    Data Review:    CBC Recent Labs  Lab 04/22/18 1159  WBC 14.7*  HGB 13.2  HCT 41.0  PLT 295  MCV 93.2  MCH 30.0  MCHC 32.2  RDW 12.4  LYMPHSABS 2.5  MONOABS 1.5*  EOSABS 0.5  BASOSABS 0.1   ----------------------------------------------------------------------------------------------------------------  Chemistries  Recent Labs  Lab 04/22/18 1159  NA 134*  K 3.8  CL 92*  CO2 30  GLUCOSE 124*  BUN 8  CREATININE 0.99  CALCIUM 8.7*   ------------------------------------------------------------------------------------------------------------------ estimated creatinine clearance is 58 mL/min (by C-G formula based on SCr of 0.99 mg/dL). ------------------------------------------------------------------------------------------------------------------ No results for input(s): TSH, T4TOTAL, T3FREE, THYROIDAB in the last 72 hours.  Invalid input(s): FREET3  Coagulation profile No results for input(s): INR, PROTIME in the  last 168 hours. ------------------------------------------------------------------------------------------------------------------- No results for input(s): DDIMER in the last 72 hours. -------------------------------------------------------------------------------------------------------------------  Cardiac Enzymes No results for input(s): CKMB, TROPONINI, MYOGLOBIN in the last 168 hours.  Invalid input(s): CK ------------------------------------------------------------------------------------------------------------------    Component Value Date/Time   BNP 666.7 (H) 12/08/2017 0415   ---------------------------------------------------------------------------------------------------------------  Urinalysis    Component Value Date/Time   COLORURINE YELLOW 04/22/2018 1251   APPEARANCEUR CLOUDY (A) 04/22/2018 1251   LABSPEC 1.006 04/22/2018 1251   PHURINE 9.0 (H) 04/22/2018 1251   GLUCOSEU NEGATIVE 04/22/2018 1251   HGBUR SMALL (A) 04/22/2018 1251   BILIRUBINUR NEGATIVE 04/22/2018 1251   KETONESUR NEGATIVE 04/22/2018 1251   PROTEINUR 30 (A) 04/22/2018 1251   NITRITE POSITIVE (A) 04/22/2018 1251   LEUKOCYTESUR LARGE (A) 04/22/2018 1251    ----------------------------------------------------------------------------------------------------------------   Imaging Results:    Ct Head Wo Contrast  Result Date: 04/22/2018 CLINICAL DATA:  Unwitnessed fall with altered mental status. Last seen normal 2 days ago. EXAM: CT HEAD WITHOUT CONTRAST CT CERVICAL SPINE WITHOUT CONTRAST TECHNIQUE: Multidetector CT imaging of the head and cervical spine was performed following the standard protocol without intravenous contrast. Multiplanar CT image reconstructions of the cervical spine were also generated. COMPARISON:  04/20/2018 FINDINGS: CT HEAD FINDINGS Brain: Mild generalized atrophy. No sign of acute infarction, mass lesion, hemorrhage, hydrocephalus or extra-axial collection. Vascular: There is  atherosclerotic calcification of the major vessels at the base of the brain. Skull: No skull fracture. Sinuses/Orbits: Inflammatory changes throughout the sinuses similar to the previous study. Orbits negative. Other: None CT CERVICAL SPINE FINDINGS Alignment: Mild curvature convex to the left. Straightening of the normal cervical lordosis. Skull base and vertebrae: No fracture or traumatic malalignment. Soft tissues and spinal canal: No soft tissue injury or significant finding. Carotid calcification. Disc levels: Osteoarthritis at the C1-2 articulation. Facet osteoarthritis on the right at C2-3, C3-4, C4-5 and C7-T1. Facet osteoarthritis on the left  at C2-3, C3-4 and C7-T1. Degenerative spondylosis with disc space narrowing and endplate osteophytes at all levels within the cervical region, most pronounced at C3-4, C4-5, C5-6 and C6-7. No compressive central canal stenosis identified. Foraminal narrowing that could cause neural compression most pronounced on the left at C2-3, left more than right at C3-4, right more than left at C4-5, left more than right at C5-6 and bilateral at C6-7. Upper chest: Negative Other: None IMPRESSION: Head CT: No acute or traumatic finding.  Mild age related atrophy. Cervical spine CT: No acute or traumatic finding. Advanced chronic degenerative changes as outlined above with foraminal stenoses. Electronically Signed   By: Nelson Chimes M.D.   On: 04/22/2018 13:26   Ct Cervical Spine Wo Contrast  Result Date: 04/22/2018 CLINICAL DATA:  Unwitnessed fall with altered mental status. Last seen normal 2 days ago. EXAM: CT HEAD WITHOUT CONTRAST CT CERVICAL SPINE WITHOUT CONTRAST TECHNIQUE: Multidetector CT imaging of the head and cervical spine was performed following the standard protocol without intravenous contrast. Multiplanar CT image reconstructions of the cervical spine were also generated. COMPARISON:  04/20/2018 FINDINGS: CT HEAD FINDINGS Brain: Mild generalized atrophy. No sign  of acute infarction, mass lesion, hemorrhage, hydrocephalus or extra-axial collection. Vascular: There is atherosclerotic calcification of the major vessels at the base of the brain. Skull: No skull fracture. Sinuses/Orbits: Inflammatory changes throughout the sinuses similar to the previous study. Orbits negative. Other: None CT CERVICAL SPINE FINDINGS Alignment: Mild curvature convex to the left. Straightening of the normal cervical lordosis. Skull base and vertebrae: No fracture or traumatic malalignment. Soft tissues and spinal canal: No soft tissue injury or significant finding. Carotid calcification. Disc levels: Osteoarthritis at the C1-2 articulation. Facet osteoarthritis on the right at C2-3, C3-4, C4-5 and C7-T1. Facet osteoarthritis on the left at C2-3, C3-4 and C7-T1. Degenerative spondylosis with disc space narrowing and endplate osteophytes at all levels within the cervical region, most pronounced at C3-4, C4-5, C5-6 and C6-7. No compressive central canal stenosis identified. Foraminal narrowing that could cause neural compression most pronounced on the left at C2-3, left more than right at C3-4, right more than left at C4-5, left more than right at C5-6 and bilateral at C6-7. Upper chest: Negative Other: None IMPRESSION: Head CT: No acute or traumatic finding.  Mild age related atrophy. Cervical spine CT: No acute or traumatic finding. Advanced chronic degenerative changes as outlined above with foraminal stenoses. Electronically Signed   By: Nelson Chimes M.D.   On: 04/22/2018 13:26    Radiological Exams on Admission: Ct Head Wo Contrast  Result Date: 04/22/2018 CLINICAL DATA:  Unwitnessed fall with altered mental status. Last seen normal 2 days ago. EXAM: CT HEAD WITHOUT CONTRAST CT CERVICAL SPINE WITHOUT CONTRAST TECHNIQUE: Multidetector CT imaging of the head and cervical spine was performed following the standard protocol without intravenous contrast. Multiplanar CT image reconstructions of  the cervical spine were also generated. COMPARISON:  04/20/2018 FINDINGS: CT HEAD FINDINGS Brain: Mild generalized atrophy. No sign of acute infarction, mass lesion, hemorrhage, hydrocephalus or extra-axial collection. Vascular: There is atherosclerotic calcification of the major vessels at the base of the brain. Skull: No skull fracture. Sinuses/Orbits: Inflammatory changes throughout the sinuses similar to the previous study. Orbits negative. Other: None CT CERVICAL SPINE FINDINGS Alignment: Mild curvature convex to the left. Straightening of the normal cervical lordosis. Skull base and vertebrae: No fracture or traumatic malalignment. Soft tissues and spinal canal: No soft tissue injury or significant finding. Carotid calcification. Disc levels: Osteoarthritis  at the C1-2 articulation. Facet osteoarthritis on the right at C2-3, C3-4, C4-5 and C7-T1. Facet osteoarthritis on the left at C2-3, C3-4 and C7-T1. Degenerative spondylosis with disc space narrowing and endplate osteophytes at all levels within the cervical region, most pronounced at C3-4, C4-5, C5-6 and C6-7. No compressive central canal stenosis identified. Foraminal narrowing that could cause neural compression most pronounced on the left at C2-3, left more than right at C3-4, right more than left at C4-5, left more than right at C5-6 and bilateral at C6-7. Upper chest: Negative Other: None IMPRESSION: Head CT: No acute or traumatic finding.  Mild age related atrophy. Cervical spine CT: No acute or traumatic finding. Advanced chronic degenerative changes as outlined above with foraminal stenoses. Electronically Signed   By: Nelson Chimes M.D.   On: 04/22/2018 13:26   Ct Cervical Spine Wo Contrast  Result Date: 04/22/2018 CLINICAL DATA:  Unwitnessed fall with altered mental status. Last seen normal 2 days ago. EXAM: CT HEAD WITHOUT CONTRAST CT CERVICAL SPINE WITHOUT CONTRAST TECHNIQUE: Multidetector CT imaging of the head and cervical spine was  performed following the standard protocol without intravenous contrast. Multiplanar CT image reconstructions of the cervical spine were also generated. COMPARISON:  04/20/2018 FINDINGS: CT HEAD FINDINGS Brain: Mild generalized atrophy. No sign of acute infarction, mass lesion, hemorrhage, hydrocephalus or extra-axial collection. Vascular: There is atherosclerotic calcification of the major vessels at the base of the brain. Skull: No skull fracture. Sinuses/Orbits: Inflammatory changes throughout the sinuses similar to the previous study. Orbits negative. Other: None CT CERVICAL SPINE FINDINGS Alignment: Mild curvature convex to the left. Straightening of the normal cervical lordosis. Skull base and vertebrae: No fracture or traumatic malalignment. Soft tissues and spinal canal: No soft tissue injury or significant finding. Carotid calcification. Disc levels: Osteoarthritis at the C1-2 articulation. Facet osteoarthritis on the right at C2-3, C3-4, C4-5 and C7-T1. Facet osteoarthritis on the left at C2-3, C3-4 and C7-T1. Degenerative spondylosis with disc space narrowing and endplate osteophytes at all levels within the cervical region, most pronounced at C3-4, C4-5, C5-6 and C6-7. No compressive central canal stenosis identified. Foraminal narrowing that could cause neural compression most pronounced on the left at C2-3, left more than right at C3-4, right more than left at C4-5, left more than right at C5-6 and bilateral at C6-7. Upper chest: Negative Other: None IMPRESSION: Head CT: No acute or traumatic finding.  Mild age related atrophy. Cervical spine CT: No acute or traumatic finding. Advanced chronic degenerative changes as outlined above with foraminal stenoses. Electronically Signed   By: Nelson Chimes M.D.   On: 04/22/2018 13:26   DVT Prophylaxis -SCD  /heparin AM Labs Ordered, also please review Full Orders  Family Communication: Admission, patients condition and plan of care including tests being  ordered have been discussed with the patient who indicate understanding and agree with the plan   Code Status - Full Code  Likely DC to  ALF/SNF  Condition   stable  Roxan Hockey M.D on 04/22/2018 at 3:45 PM  Go to [email or web address] - password [email or web address] for contact info  [facility] - Office  [phone]

## 2018-04-22 NOTE — ED Notes (Signed)
Report given to Jay RN

## 2018-04-22 NOTE — ED Triage Notes (Signed)
Pt to ER by [facility] from [facility] for unwitnessed fall and altered mental status, LSN 2 days ago, patient reportedly at baseline is a/o x4 but since yesterday has been hallucinating and "talking out of his head." pt recently diagnosed with UTI. Chronically on nasal cannula O2. Pt is in NAD on arrival.

## 2018-04-22 NOTE — ED Notes (Signed)
Attempted Report 

## 2018-04-22 NOTE — ED Notes (Signed)
Pt alert but not oriented. Pt arrives from [facility] with foley in place from the facility.

## 2018-04-22 NOTE — ED Notes (Signed)
Admitting MD paged about the pt consectively low trending BPs.

## 2018-04-22 NOTE — ED Provider Notes (Signed)
[facility] EMERGENCY DEPARTMENT Provider Note   CSN: [id number] Arrival date & time: 04/22/18  7824     History   Chief Complaint Chief Complaint  Patient presents with  . Altered Mental Status  . Fall    HPI [name] is a 84 y.o. male.  HPI Patient presents to the emergency department with an unwitnessed fall from bed and was found this morning.  The nursing staff states that yesterday afternoon he seemed to be his baseline mentation and was not making sense.  The patient then was found to be in the same this morning following the fall.  The patient was started on ciprofloxacin for his urinary tract infection.  The patient is unable to give any history and all history is obtained from EMS and the family.  The wife states that patient normally can carry on conversation that is appropriate but does not necessarily remember all conversations.   Past Medical History:  Diagnosis Date  . A-fib ([place])   . AKI (acute kidney injury) ([place])   . Arthritis   . Benign hypertension with chronic kidney disease, stage III ([place]) 11/28/2015  . Bladder cancer ([place])   . CAD (coronary artery disease) 05/31/2016   cath 02/28/11: dLM 10%, pLAD 20%, pRCA calcified nodule 50% (FFR 0.86 - not significant), mRCA 40-50%, mildly elevated filling pressures, EF 50-55%  . Cataract   . Chronic kidney disease   . CKD (chronic kidney disease) stage 3, GFR 30-59 ml/min (HCC)   . CKD (chronic kidney disease), stage III ([place]) 11/28/2015   follows up with Dr. Clydell Hakim Last creatinine 1.36, EGFR 50  . Dyslipidemia   . First degree AV block 06/28/2013  . Gait abnormality 05/26/2017  . Glaucoma   . Hard of hearing   . Hyperlipidemia 11/28/2015   Last Assessment & Plan: Lipids were at goal a year ago; continue statin  . Hypertension    LOV with EKG 06/11/11 Dr Caryl [facility]  . Malignant neoplasm of prostate [facility])    surgery and radiation  . Malignant neoplasm of urinary bladder ([place]) 07/01/2012     surgery and chemo  . Memory difficulty 05/26/2017  . NSVT (nonsustained ventricular tachycardia) ([place]) 11/28/2015   Overview: Overview: Overview: echo 6/12: EF 55%, basal inferolat AK, mild LVH Last Assessment & Plan: resolved Overview: echo 6/12: EF 55%, basal inferolat AK, mild LVH Last Assessment & Plan: resolved  . Paroxysmal ventricular tachycardia ([place]) 05/31/2016   Overview: Overview: echo 6/12: EF 55%, basal inferolat AK, mild LVH Last Assessment & Plan: resolved  . Prostate cancer ([place])    status post brachytherapy---in 2003  . PVC (premature ventricular contraction)   . Sleep apnea    STOP BANG SCORE 4  . Ventricular bigeminy 05/31/2016   Overview: Overview: rhythmol Date PR QRS 6/12 23 088 10/13 24 110 10/14 31 109 Last Assessment & Plan: The patient is without symptomatic oral echocardiographic ectopy. His exercise tolerance is quite good. I am concerned about the interval prolongation of his PR interval. We will follow this closely. It is potentially related to the presence of his 1C antiarrhythmic and we may need to discontinue  . Ventricular tachycardia, nonsustained/bigeminy    echo 6/12: EF 55%, basal inferolat AK, mild LVH    Patient Active Problem List   Diagnosis Date Noted  . UTI (urinary tract infection), bacterial/CAUTI/Pseudomonas 04/22/2018  . Metabolic encephalopathy 23/53/6144  . E. coli UTI (urinary tract infection) 03/10/2018  . Acute metabolic encephalopathy  03/10/2018  . Acute kidney injury ([place]) 03/10/2018  . Chronic systolic (congestive) heart failure ([place]) 03/10/2018  . Pneumonia 02/24/2018  . Pleural effusion on right 12/05/2017  . Acute respiratory failure with hypoxia ([place]) 12/05/2017  . Memory difficulty 05/26/2017  . Gait abnormality 05/26/2017  . Constipation 01/03/2017  . Atrial fibrillation ([place]) 01/03/2017  . Hyponatremia 01/03/2017  . First degree AV block 06/28/2013  . Bladder cancer ([place]) 07/01/2012  . Cataracts, bilateral 03/14/2011   . Ventricular bigeminy   . Nonsustained ventricular tachycardia ([place])   . Nonobstructive CAD (coronary artery disease)   . Hypertension   . Hyperlipidemia     Past Surgical History:  Procedure Laterality Date  . APPENDECTOMY  1951  . BLADDER SURGERY  2013   followed by chemo  . CARDIAC CATHETERIZATION  6/12  . CATARACT EXTRACTION W/ INTRAOCULAR LENS IMPLANT Right 2016   Right eye [facility] Dr. Lennox Pippins  . CATARACT EXTRACTION W/ INTRAOCULAR LENS IMPLANT Left 2014  . CORONARY ANGIOPLASTY     no significant blockage requiring stenting per patient  . CYSTOSCOPY  05/20/2012   Procedure: CYSTOSCOPY;  Surgeon: Alexis Frock, MD;  Location: [facility];  Service: Urology;  Laterality: N/A;  . CYSTOSCOPY  07/01/2012   Procedure: CYSTOSCOPY;  Surgeon: Molli Hazard, MD;  Location: [facility];  Service: Urology;  Laterality: N/A;  . DESCEMETS STRIPPING AUTOMATED ENDOTHELIAL KERATOPLASTY Right 06/13/2016   Procedure: DESCEMETS STRIPPING ENDOTHELIAL KERATOPLASTY; Surgeon: Marilynne Halsted, MD; Location: [facility] MAIN OR; Service: Ophthalmology; Laterality: Right;  . DESCEMETS STRIPPING AUTOMATED ENDOTHELIAL KERATOPLASTY Right 11/14/2016   Procedure: DESCEMETS STRIPPING ENDOTHELIAL KERATOPLASTY; Surgeon: Marilynne Halsted, MD; Location: [facility]IN OR; Service: Ophthalmology; Laterality: Right;  . EYE SURGERY     left cataract extraction with IOL  . PROSTATE SURGERY  2003   followed by radiation  . SKIN BIOPSY     BCC excised   . SKIN CANCER EXCISION     x 4  . TRANSURETHRAL RESECTION OF BLADDER TUMOR  05/20/2012   Procedure: TRANSURETHRAL RESECTION OF BLADDER TUMOR (TURBT);  Surgeon: Alexis Frock, MD;  Location: [facility];  Service: Urology;  Laterality: N/A;  [place]   . TRANSURETHRAL RESECTION OF BLADDER TUMOR  07/01/2012   Procedure: TRANSURETHRAL RESECTION OF BLADDER TUMOR (TURBT);  Surgeon: Molli Hazard, MD;  Location: [facility];  Service: Urology;  Laterality: N/A;  Cystoscopy, TURBT,  be prepared for Open Cystorraphy            Home Medications    Prior to Admission medications   Medication Sig Start Date End Date Taking? Authorizing Provider  acetaminophen (TYLENOL) 500 MG tablet Take 500 mg by mouth every 6 (six) hours as needed for mild pain.   Yes [provider]  aspirin EC 81 MG tablet Take 81 mg by mouth daily.    Yes [provider]  atorvastatin (LIPITOR) 20 MG tablet Take 20 mg by mouth every evening.    Yes [provider]  barrier cream (NON-SPECIFIED) CREA Apply 1 application topically 3 (three) times daily as needed (sacrum and groin redness).   Yes [provider]  ciprofloxacin (CIPRO) 500 MG tablet Take 500 mg by mouth 2 (two) times daily. 04/21/18 04/26/18 Yes [provider]  furosemide (LASIX) 20 MG tablet Take 1 tablet (20 mg total) by mouth daily. 02/28/18  Yes Patrecia Pour, Christean Grief, MD  hydrOXYzine (ATARAX/VISTARIL) 25 MG tablet Take 25 mg by mouth 3 (three) times daily.   Yes [provider]  OXYGEN  Inhale 2 L into the lungs continuous.   Yes [provider]  prednisoLONE acetate (PRED FORTE) 1 % ophthalmic suspension Place 2 drops into both eyes 2 (two) times daily as needed (allergies).    Yes [provider]    Family History Family History  Problem Relation Age of Onset  . Hypertension Mother   . Arthritis Son   . Heart Problems Son        Aortic valve replacement  . Lung cancer Daughter   . Heart attack Neg Hx   . Stroke Neg Hx     Social History Social History   Tobacco Use  . Smoking status: Former Smoker    Types: Cigars    Last attempt to quit: 05/20/1995    Years since quitting: 22.9  . Smokeless tobacco: Current User    Types: Chew  Substance Use Topics  . Alcohol use: Not Currently  . Drug use: No     Allergies   Neosporin [neomycin-bacitracin zn-polymyx]; Penicillins; and Penicillin g   Review of Systems Review of Systems Level 5 caveat  applies due to altered mental status  Physical Exam Updated Vital Signs BP 116/69   Pulse 86   Temp 98.3 F (36.8 C) (Oral)   Resp (!) 22   SpO2 96%   Physical Exam  Constitutional: He appears well-developed and well-nourished. No distress.  HENT:  Head: Normocephalic and atraumatic.  Mouth/Throat: Oropharynx is clear and moist.  Eyes: Pupils are equal, round, and reactive to light.  Neck: Normal range of motion. Neck supple.  Cardiovascular: Normal rate, regular rhythm and normal heart sounds. Exam reveals no gallop and no friction rub.  No murmur heard. Pulmonary/Chest: Effort normal and breath sounds normal. No respiratory distress. He has no wheezes.  Abdominal: Soft. Bowel sounds are normal. He exhibits no distension. There is no tenderness.  Neurological: He is alert. He exhibits normal muscle tone. Coordination normal.  Skin: Skin is warm and dry. Capillary refill takes less than 2 seconds. No rash noted. No erythema.  Psychiatric: He has a normal mood and affect. His behavior is normal.  Nursing note and vitals reviewed.    ED Treatments / Results  Labs (all labs ordered are listed, but only abnormal results are displayed) Labs Reviewed  BASIC METABOLIC PANEL - Abnormal; Notable for the following components:      Result Value   Sodium 134 (*)    Chloride 92 (*)    Glucose, Bld 124 (*)    Calcium 8.7 (*)    All other components within normal limits  CBC WITH DIFFERENTIAL/PLATELET - Abnormal; Notable for the following components:   WBC 14.7 (*)    Neutro Abs 10.0 (*)    Monocytes Absolute 1.5 (*)    All other components within normal limits  URINALYSIS, ROUTINE W REFLEX MICROSCOPIC - Abnormal; Notable for the following components:   APPearance CLOUDY (*)    pH 9.0 (*)    Hgb urine dipstick SMALL (*)    Protein, ur 30 (*)    Nitrite POSITIVE (*)    Leukocytes, UA LARGE (*)    Bacteria, UA MANY (*)    All other components within normal limits  URINE CULTURE    CULTURE, BLOOD (ROUTINE X 2)  CULTURE, BLOOD (ROUTINE X 2)  CULTURE, BLOOD (ROUTINE X 2)  CULTURE, BLOOD (ROUTINE X 2)  CBC  CREATININE, SERUM    EKG EKG Interpretation  Date/Time:  Wednesday April 22 2018 09:59:54 EDT Ventricular Rate:  96 PR Interval:    QRS Duration: 105 QT Interval:  382 QTC Calculation: 483 R Axis:   -40 Text Interpretation:  Atrial fibrillation Paired ventricular premature complexes Left axis deviation Probable anteroseptal infarct, old No significant change since last tracing Confirmed by Blanchie Dessert [phone]) on 04/22/2018 11:39:59 AM   Radiology Ct Head Wo Contrast  Result Date: 04/22/2018 CLINICAL DATA:  Unwitnessed fall with altered mental status. Last seen normal 2 days ago. EXAM: CT HEAD WITHOUT CONTRAST CT CERVICAL SPINE WITHOUT CONTRAST TECHNIQUE: Multidetector CT imaging of the head and cervical spine was performed following the standard protocol without intravenous contrast. Multiplanar CT image reconstructions of the cervical spine were also generated. COMPARISON:  04/20/2018 FINDINGS: CT HEAD FINDINGS Brain: Mild generalized atrophy. No sign of acute infarction, mass lesion, hemorrhage, hydrocephalus or extra-axial collection. Vascular: There is atherosclerotic calcification of the major vessels at the base of the brain. Skull: No skull fracture. Sinuses/Orbits: Inflammatory changes throughout the sinuses similar to the previous study. Orbits negative. Other: None CT CERVICAL SPINE FINDINGS Alignment: Mild curvature convex to the left. Straightening of the normal cervical lordosis. Skull base and vertebrae: No fracture or traumatic malalignment. Soft tissues and spinal canal: No soft tissue injury or significant finding. Carotid calcification. Disc levels: Osteoarthritis at the C1-2 articulation. Facet osteoarthritis on the right at C2-3, C3-4, C4-5 and C7-T1. Facet osteoarthritis on the left at C2-3, C3-4 and C7-T1. Degenerative spondylosis with disc  space narrowing and endplate osteophytes at all levels within the cervical region, most pronounced at C3-4, C4-5, C5-6 and C6-7. No compressive central canal stenosis identified. Foraminal narrowing that could cause neural compression most pronounced on the left at C2-3, left more than right at C3-4, right more than left at C4-5, left more than right at C5-6 and bilateral at C6-7. Upper chest: Negative Other: None IMPRESSION: Head CT: No acute or traumatic finding.  Mild age related atrophy. Cervical spine CT: No acute or traumatic finding. Advanced chronic degenerative changes as outlined above with foraminal stenoses. Electronically Signed   By: Nelson Chimes M.D.   On: 04/22/2018 13:26   Ct Cervical Spine Wo Contrast  Result Date: 04/22/2018 CLINICAL DATA:  Unwitnessed fall with altered mental status. Last seen normal 2 days ago. EXAM: CT HEAD WITHOUT CONTRAST CT CERVICAL SPINE WITHOUT CONTRAST TECHNIQUE: Multidetector CT imaging of the head and cervical spine was performed following the standard protocol without intravenous contrast. Multiplanar CT image reconstructions of the cervical spine were also generated. COMPARISON:  04/20/2018 FINDINGS: CT HEAD FINDINGS Brain: Mild generalized atrophy. No sign of acute infarction, mass lesion, hemorrhage, hydrocephalus or extra-axial collection. Vascular: There is atherosclerotic calcification of the major vessels at the base of the brain. Skull: No skull fracture. Sinuses/Orbits: Inflammatory changes throughout the sinuses similar to the previous study. Orbits negative. Other: None CT CERVICAL SPINE FINDINGS Alignment: Mild curvature convex to the left. Straightening of the normal cervical lordosis. Skull base and vertebrae: No fracture or traumatic malalignment. Soft tissues and spinal canal: No soft tissue injury or significant finding. Carotid calcification. Disc levels: Osteoarthritis at the C1-2 articulation. Facet osteoarthritis on the right at C2-3, C3-4, C4-5  and C7-T1. Facet osteoarthritis on the left at C2-3, C3-4 and C7-T1. Degenerative spondylosis with disc space narrowing and endplate osteophytes at all levels within the cervical region, most pronounced at C3-4, C4-5, C5-6 and C6-7. No compressive central canal stenosis identified. Foraminal narrowing that could cause neural compression most pronounced on the left at C2-3, left more than right  at C3-4, right more than left at C4-5, left more than right at C5-6 and bilateral at C6-7. Upper chest: Negative Other: None IMPRESSION: Head CT: No acute or traumatic finding.  Mild age related atrophy. Cervical spine CT: No acute or traumatic finding. Advanced chronic degenerative changes as outlined above with foraminal stenoses. Electronically Signed   By: Nelson Chimes M.D.   On: 04/22/2018 13:26    Procedures Procedures (including critical care time)  Medications Ordered in ED Medications  ceFEPIme (MAXIPIME) 1 g in sodium chloride 0.9 % 100 mL IVPB (has no administration in time range)  atorvastatin (LIPITOR) tablet 20 mg (has no administration in time range)  aspirin EC tablet 81 mg (has no administration in time range)  prednisoLONE acetate (PRED FORTE) 1 % ophthalmic suspension 2 drop (has no administration in time range)  hydrOXYzine (ATARAX/VISTARIL) tablet 25 mg (has no administration in time range)  barrier cream (non-specified) 1 application (has no administration in time range)  sodium chloride flush (NS) 0.9 % injection 3 mL (has no administration in time range)  sodium chloride flush (NS) 0.9 % injection 3 mL (has no administration in time range)  0.9 %  sodium chloride infusion (has no administration in time range)  acetaminophen (TYLENOL) tablet 650 mg (has no administration in time range)    Or  acetaminophen (TYLENOL) suppository 650 mg (has no administration in time range)  traZODone (DESYREL) tablet 50 mg (has no administration in time range)  polyethylene glycol (MIRALAX / GLYCOLAX)  packet 17 g (has no administration in time range)  ondansetron (ZOFRAN) tablet 4 mg (has no administration in time range)    Or  ondansetron (ZOFRAN) injection 4 mg (has no administration in time range)  albuterol (PROVENTIL) (2.5 MG/3ML) 0.083% nebulizer solution 2.5 mg (has no administration in time range)  heparin injection 5,000 Units (has no administration in time range)  dextrose 5 %-0.45 % sodium chloride infusion (has no administration in time range)  cefTRIAXone (ROCEPHIN) 1 g in sodium chloride 0.9 % 100 mL IVPB (0 g Intravenous Stopped 04/22/18 1455)     Initial Impression / Assessment and Plan / ED Course  I have reviewed the triage vital signs and the nursing notes.  Pertinent labs & imaging results that were available during my care of the patient were reviewed by me and considered in my medical decision making (see chart for details).     With the [facility] who will admit the patient for further evaluation and care.  Patient will most likely need antibiotics directed towards Pseudomonas due to a recent urinary culture.  Family is advised the plan and all questions were answered.  Final Clinical Impressions(s) / ED Diagnoses   Final diagnoses:  None    ED Discharge Orders    None       Dalia Heading, PA-C 04/22/18 1553    Blanchie Dessert, MD 04/24/18 1445

## 2018-04-23 DIAGNOSIS — L89150 Pressure ulcer of sacral region, unstageable: Secondary | ICD-10-CM

## 2018-04-23 DIAGNOSIS — L8990 Pressure ulcer of unspecified site, unspecified stage: Secondary | ICD-10-CM

## 2018-04-23 DIAGNOSIS — G9341 Metabolic encephalopathy: Secondary | ICD-10-CM

## 2018-04-23 DIAGNOSIS — A499 Bacterial infection, unspecified: Secondary | ICD-10-CM

## 2018-04-23 DIAGNOSIS — J9611 Chronic respiratory failure with hypoxia: Secondary | ICD-10-CM

## 2018-04-23 DIAGNOSIS — L89154 Pressure ulcer of sacral region, stage 4: Secondary | ICD-10-CM

## 2018-04-23 DIAGNOSIS — N390 Urinary tract infection, site not specified: Secondary | ICD-10-CM

## 2018-04-23 DIAGNOSIS — I5022 Chronic systolic (congestive) heart failure: Secondary | ICD-10-CM

## 2018-04-23 LAB — MRSA PCR SCREENING: MRSA BY PCR: POSITIVE — AB

## 2018-04-23 LAB — CBC
HCT: 39.3 % (ref 39.0–52.0)
Hemoglobin: 12.6 g/dL — ABNORMAL LOW (ref 13.0–17.0)
MCH: 30.5 pg (ref 26.0–34.0)
MCHC: 32.1 g/dL (ref 30.0–36.0)
MCV: 95.2 fL (ref 78.0–100.0)
PLATELETS: 357 10*3/uL (ref 150–400)
RBC: 4.13 MIL/uL — ABNORMAL LOW (ref 4.22–5.81)
RDW: 12.7 % (ref 11.5–15.5)
WBC: 12.8 10*3/uL — ABNORMAL HIGH (ref 4.0–10.5)

## 2018-04-23 LAB — BASIC METABOLIC PANEL
Anion gap: 12 (ref 5–15)
BUN: 8 mg/dL (ref 8–23)
CALCIUM: 8.7 mg/dL — AB (ref 8.9–10.3)
CO2: 32 mmol/L (ref 22–32)
CREATININE: 0.92 mg/dL (ref 0.61–1.24)
Chloride: 94 mmol/L — ABNORMAL LOW (ref 98–111)
GFR calc non Af Amer: >60 mL/min (ref >60)
GLUCOSE: 97 mg/dL (ref 70–99)
Potassium: 3.5 mmol/L (ref 3.5–5.1)
Sodium: 138 mmol/L (ref 135–145)

## 2018-04-23 MED ORDER — FUROSEMIDE 20 MG PO TABS
20.0000 mg | ORAL_TABLET | Freq: Every day | ORAL | Status: DC
  Administered 2018-04-23: 20 mg via ORAL
  Filled 2018-04-23: qty 1

## 2018-04-23 MED ORDER — CHLORHEXIDINE GLUCONATE CLOTH 2 % EX PADS
6.0000 | MEDICATED_PAD | Freq: Every day | CUTANEOUS | Status: DC
  Administered 2018-04-23 – 2018-04-25 (×3): 6 via TOPICAL

## 2018-04-23 MED ORDER — COLLAGENASE 250 UNIT/GM EX OINT
TOPICAL_OINTMENT | Freq: Every day | CUTANEOUS | Status: DC
  Administered 2018-04-23 – 2018-04-25 (×3): via TOPICAL
  Filled 2018-04-23: qty 30

## 2018-04-23 MED ORDER — MUPIROCIN 2 % EX OINT
1.0000 "application " | TOPICAL_OINTMENT | Freq: Two times a day (BID) | CUTANEOUS | Status: DC
  Administered 2018-04-23 – 2018-04-25 (×5): 1 via NASAL
  Filled 2018-04-23 (×2): qty 22

## 2018-04-23 NOTE — Progress Notes (Signed)
PROGRESS NOTE  [name]:[id number] DOB: 1927/08/13 DOA: 04/22/2018 PCP: Gregor Hams, FNP  Brief Narrative: 84 year old man PMH dementia, chronic hypoxic respiratory failure, presented with history of fall and confusion at nursing home.  Admitted for Pseudomonas UTI, catheter associated, complicated, with associated acute encephalopathy.  Assessment/Plan Catheter associated UTI, presumably Pseudomonas, with associated acute encephalopathy --Continue empiric antibiotics.  WBC trending down.  Encephalopathy improving. --Appears to be dramatically improved today.  Alert, oriented to self and location although still confused to year.  Answer simple questions appropriately.  PMH bladder cancer, chronic indwelling Foley catheter for 4 months  Atrial fibrillation, chronic systolic CHF.  Unable to tolerate beta-blocker secondary to significant bradycardia.  Not an anticoagulation candidate secondary to recurrent falls. --Currently in sinus rhythm.  CHF appears compensated.  Continue aspirin.  Follow clinically.  Currently in sinus rhythm on telemetry with brief episode of SVT.  Possible syncope in nursing home versus fall.  Patient poor historian.  CT head and CT cervical spine without acute findings.  Doubt cardiogenic in nature. --Physical therapy evaluation plan.  Otherwise monitor clinically.  Chronic hypoxic respiratory failure on 2 L, COPD  Sacral unstageable pressure injuries, present on admission --Management per wound care nurse   Appears to be improving  DVT prophylaxis: heparin Code Status: Full Family Communication: none Disposition Plan: return to [facility]     Murray Hodgkins, MD  [facility] Direct contact: [phone] --[place] [place]  --[email or web address]; password [email or web address]  7PM-7AM contact night coverage as above 04/23/2018, 11:58 AM  LOS: 1 day   Consultants:    Procedures:    Antimicrobials:  Cefepime 8/7 >>  Interval  history/Subjective: Feels fine.  No complaints other than feeling thirsty.  Objective: Vitals:  Vitals:   04/23/18 0416 04/23/18 1033  BP: 95/76 (!) 125/100  Pulse: 99 76  Resp: 16 20  Temp: (!) 97.5 F (36.4 C) 97.7 F (36.5 C)  SpO2: 92% 100%    Exam:  Constitutional:  . Appears calm and comfortable ENMT:  . Hard of hearing Respiratory:  . CTA bilaterally, no w/r/r.  . Respiratory effort normal.  Cardiovascular:  . RRR, no m/r/g . No LE extremity edema   Abdomen:  . Soft, ntnd Psychiatric:  . Mental status o Mood, affect appropriate o Orientation to person, [facility] hospital; not month or year  I have personally reviewed the following:   Labs:  Basic metabolic panel unremarkable  WBC trending down, 12.8; remainder CBC unremarkable  Imaging studies:  Head CT no acute findings.  Cervical spine CT, no acute findings.  Medical tests:  EKG sinus rhythm with frequent PVCs, anteroseptal MI, old.  Scheduled Meds: . aspirin EC  81 mg Oral Daily  . atorvastatin  20 mg Oral QPM  . Chlorhexidine Gluconate Cloth  6 each Topical Q0600  . collagenase   Topical Daily  . furosemide  20 mg Oral Daily  . heparin  5,000 Units Subcutaneous Q8H  . hydrOXYzine  25 mg Oral TID  . mupirocin ointment  1 application Nasal BID  . sodium chloride flush  3 mL Intravenous Q12H   Continuous Infusions: . sodium chloride    . ceFEPime (MAXIPIME) IV 1 g (04/23/18 0500)    Principal Problem:   UTI (urinary tract infection), bacterial/CAUTI/Pseudomonas Active Problems:   Hypertension   Atrial fibrillation (HCC)   Acute metabolic encephalopathy   Chronic systolic (congestive) heart failure (HCC)   Metabolic encephalopathy   UTI (urinary tract infection)   Pressure  injury of skin   Chronic respiratory failure with hypoxia (HCC)   Sacral pressure ulcer   LOS: 1 day

## 2018-04-23 NOTE — Consult Note (Signed)
[facility] Nurse wound consult note Reason for Consult: Consult requested for sacrum wounds Wound type: Upper sacrum with unstageable pressure injury; .3X1cm, 100% yellow slough, tightly adhered, small amt yellow drainage, no odor Lower sacrum with unstageable pressure injury; .3X1.5cm, 100% yellow slough, tightly adhered, small amt yellow drainage, no odor Pt has red moist macerated skin surrounding the wounds; approx 6X6cm area; appearance is consistent with moisture associated skin damage. Pressure Injury POA: Yes Dressing procedure/placement/frequency: Santyl ointment to provide enzymatic debridement of nonviable tissue.  No family present to discuss plan of care and patient does not appear to understand. Please re-consult if further assistance is needed.  Thank-you,  Julien Girt MSN, [place], [place], [place], [place]

## 2018-04-23 NOTE — Progress Notes (Signed)
Per central telemetry, pt had a 9 beat run of V-Tach. Pt currently lying in bed with his eyes closed, rest with no c/o pain or discomfort. MD made aware. Will continue to monitor.

## 2018-04-23 NOTE — Progress Notes (Signed)
New Admission Note:  Arrival Method: Via stretcher from ED Mental Orientation: Alert & Oriented x1 (self) Telemetry: CCMD verified Assessment: Completed Skin: Refer to flowsheet IV: Left FA & Right Wrist Pain: 0/10 Tubes: Safety Measures: Safety Fall Prevention Plan discussed with patient. Admission: Completed [facility] [place] Orientation: Patient has been orientated to the room, unit and the staff. Family: Son at the bedside  Orders have been reviewed and are being implemented. Will continue to monitor the patient. Call light has been placed within reach and bed alarm has been activated.   Vassie Moselle, RN  Phone Number: [phone]

## 2018-04-23 NOTE — Evaluation (Signed)
Clinical/Bedside Swallow Evaluation Patient Details  Name: [name] MRN: [id number] Date of Birth: [birth year]  Today's Date: 04/23/2018 Time: SLP Start Time (ACUTE ONLY): 1030 SLP Stop Time (ACUTE ONLY): 1045 SLP Time Calculation (min) (ACUTE ONLY): 15 min  Past Medical History:  Past Medical History:  Diagnosis Date  . A-fib ([place])   . AKI (acute kidney injury) ([place])   . Arthritis   . Benign hypertension with chronic kidney disease, stage III ([place]) 11/28/2015  . Bladder cancer ([place])   . CAD (coronary artery disease) 05/31/2016   cath 02/28/11: dLM 10%, pLAD 20%, pRCA calcified nodule 50% (FFR 0.86 - not significant), mRCA 40-50%, mildly elevated filling pressures, EF 50-55%  . Cataract   . Chronic kidney disease   . CKD (chronic kidney disease) stage 3, GFR 30-59 ml/min ([facility])   . CKD (chronic kidney disease), stage III ([place]) 11/28/2015   follows up with Dr. Clydell Hakim Last creatinine 1.36, EGFR 50  . Dyslipidemia   . First degree AV block 06/28/2013  . Gait abnormality 05/26/2017  . Glaucoma   . Hard of hearing   . Hyperlipidemia 11/28/2015   Last Assessment & Plan: Lipids were at goal a year ago; continue statin  . Hypertension    LOV with EKG 06/11/11 Dr Caryl Comes  [facility]  . Malignant neoplasm of prostate [facility])    surgery and radiation  . Malignant neoplasm of urinary bladder ([place]) 07/01/2012   surgery and chemo  . Memory difficulty 05/26/2017  . NSVT (nonsustained ventricular tachycardia) ([place]) 11/28/2015   Overview: Overview: Overview: echo 6/12: EF 55%, basal inferolat AK, mild LVH Last Assessment & Plan: resolved Overview: echo 6/12: EF 55%, basal inferolat AK, mild LVH Last Assessment & Plan: resolved  . Paroxysmal ventricular tachycardia ([place]) 05/31/2016   Overview: Overview: echo 6/12: EF 55%, basal inferolat AK, mild LVH Last Assessment & Plan: resolved  . Prostate cancer ([place])    status post brachytherapy---in 2003  . PVC (premature ventricular contraction)   . Sleep  apnea    STOP BANG SCORE 4  . Ventricular bigeminy 05/31/2016   Overview: Overview: rhythmol Date PR QRS 6/12 23 088 10/13 24 110 10/14 31 109 Last Assessment & Plan: The patient is without symptomatic oral echocardiographic ectopy. His exercise tolerance is quite good. I am concerned about the interval prolongation of his PR interval. We will follow this closely. It is potentially related to the presence of his 1C antiarrhythmic and we may need to discontinue  . Ventricular tachycardia, nonsustained/bigeminy    echo 6/12: EF 55%, basal inferolat AK, mild LVH   Past Surgical History:  Past Surgical History:  Procedure Laterality Date  . APPENDECTOMY  1951  . BLADDER SURGERY  2013   followed by chemo  . CARDIAC CATHETERIZATION  6/12  . CATARACT EXTRACTION W/ INTRAOCULAR LENS IMPLANT Right 2016   Right eye [facility] Dr. Lennox Pippins  . CATARACT EXTRACTION W/ INTRAOCULAR LENS IMPLANT Left 2014  . CORONARY ANGIOPLASTY     no significant blockage requiring stenting per patient  . CYSTOSCOPY  05/20/2012   Procedure: CYSTOSCOPY;  Surgeon: Alexis Frock, MD;  Location: [facility];  Service: Urology;  Laterality: N/A;  . CYSTOSCOPY  07/01/2012   Procedure: CYSTOSCOPY;  Surgeon: Molli Hazard, MD;  Location: [facility];  Service: Urology;  Laterality: N/A;  . DESCEMETS STRIPPING AUTOMATED ENDOTHELIAL KERATOPLASTY Right 06/13/2016   Procedure: DESCEMETS STRIPPING ENDOTHELIAL KERATOPLASTY; Surgeon: Marilynne Halsted, MD; Location: [facility] MAIN OR; Service: Ophthalmology; Laterality: Right;  .  DESCEMETS STRIPPING AUTOMATED ENDOTHELIAL KERATOPLASTY Right 11/14/2016   Procedure: DESCEMETS STRIPPING ENDOTHELIAL KERATOPLASTY; Surgeon: Marilynne Halsted, MD; Location: [facility] MAIN OR; Service: Ophthalmology; Laterality: Right;  . EYE SURGERY     left cataract extraction with IOL  . PROSTATE SURGERY  2003   followed by radiation  . SKIN BIOPSY     BCC excised   . SKIN CANCER EXCISION     x 4  . TRANSURETHRAL  RESECTION OF BLADDER TUMOR  05/20/2012   Procedure: TRANSURETHRAL RESECTION OF BLADDER TUMOR (TURBT);  Surgeon: Alexis Frock, MD;  Location: [facility];  Service: Urology;  Laterality: N/A;  [place]   . TRANSURETHRAL RESECTION OF BLADDER TUMOR  07/01/2012   Procedure: TRANSURETHRAL RESECTION OF BLADDER TUMOR (TURBT);  Surgeon: Molli Hazard, MD;  Location: [facility];  Service: Urology;  Laterality: N/A;  Cystoscopy, TURBT, be prepared for Open Cystorraphy       HPI:  [name]  is a 84 y.o. male with past medical history relevant for systolic dysfunction CHF, atrial fibrillation, history of bladder cancer with chronic indwelling Foley catheter for the last 4 months, as well as dyslipidemia who presents from the assisted living facility after a fall.  Overall patient is a very poor historian due to altered mentation/metabolic encephalopathy secondary to presumed UTI   Assessment / Plan / Recommendation Clinical Impression   Pt presents with grossly intact oropharyngeal swallowing function.  He is pleasantly confused but did have periods of lucidity during evaluation.  Mentation does have the potential to impact PO intake and swallowing safety; however, I am hopeful that as underlying infection is treated that mentation will be less of a limiting factor.  No overt s/s of aspiration were evident with solids or liquids.  Oral phase was timely and efficient for containment, transit, and clearance of solids.  As a result, no further ST needs are indicated at this time.        Aspiration Risk  Mild aspiration risk    Diet Recommendation Regular;Thin liquid   Liquid Administration via: Cup;Straw Medication Administration: Whole meds with liquid Supervision: Patient able to self feed Compensations: Minimize environmental distractions;Slow rate;Small sips/bites Postural Changes: Seated upright at 90 degrees;Remain upright for at least 30 minutes after po intake    Other  Recommendations Oral  Care Recommendations: Oral care BID   Follow up Recommendations None        Swallow Study   General HPI: Deandrea Rion  is a 84 y.o. male with past medical history relevant for systolic dysfunction CHF, atrial fibrillation, history of bladder cancer with chronic indwelling Foley catheter for the last 4 months, as well as dyslipidemia who presents from the assisted living facility after a fall.  Overall patient is a very poor historian due to altered mentation/metabolic encephalopathy secondary to presumed UTI Type of Study: Bedside Swallow Evaluation Previous Swallow Assessment: 02/25/2018(BSE, Reg/Thin) Diet Prior to this Study: Regular;Thin liquids Temperature Spikes Noted: No Respiratory Status: Nasal cannula History of Recent Intubation: No Behavior/Cognition: Alert;Cooperative;Pleasant mood;Confused Oral Cavity Assessment: Dry Oral Care Completed by SLP: No Oral Cavity - Dentition: Poor condition;Missing dentition;Adequate natural dentition Vision: Functional for self-feeding Self-Feeding Abilities: Able to feed self Patient Positioning: Upright in bed Baseline Vocal Quality: Normal Volitional Cough: Strong Volitional Swallow: Able to elicit    Oral/Motor/Sensory Function Overall Oral Motor/Sensory Function: Within functional limits   Ice Chips     Thin Liquid Thin Liquid: Within functional limits Presentation: Straw    Nectar Thick  Honey Thick     Puree     Solid     Solid: Within functional limits      Elya Diloreto, Elmyra Ricks L 04/23/2018,10:52 AM

## 2018-04-24 DIAGNOSIS — T83511A Infection and inflammatory reaction due to indwelling urethral catheter, initial encounter: Secondary | ICD-10-CM

## 2018-04-24 DIAGNOSIS — I480 Paroxysmal atrial fibrillation: Secondary | ICD-10-CM

## 2018-04-24 LAB — URINE CULTURE: Culture: 20000 — AB

## 2018-04-24 MED ORDER — SODIUM CHLORIDE 0.9 % IV BOLUS
500.0000 mL | Freq: Once | INTRAVENOUS | Status: AC
  Administered 2018-04-24: 500 mL via INTRAVENOUS

## 2018-04-24 MED ORDER — SODIUM CHLORIDE 0.9 % IV SOLN
INTRAVENOUS | Status: DC
  Administered 2018-04-24 – 2018-04-25 (×3): via INTRAVENOUS

## 2018-04-24 MED ORDER — CEFAZOLIN SODIUM-DEXTROSE 1-4 GM/50ML-% IV SOLN
1.0000 g | Freq: Three times a day (TID) | INTRAVENOUS | Status: DC
  Administered 2018-04-24 – 2018-04-25 (×3): 1 g via INTRAVENOUS
  Filled 2018-04-24 (×4): qty 50

## 2018-04-24 NOTE — Progress Notes (Signed)
Pharmacy Antibiotic Note  [name] is a 84 y.o. male admitted on 04/22/2018 with UTI in the setting of a chronic foley. Pharmacy has been consulted to narrow antibiotics to Cefazolin.   Plan: - Start Cefazolin 1g IV every 8 hours - Will continue to follow renal function, culture results, LOT, and antibiotic de-escalation plans   Weight: 161 lb (73 kg)  Temp (24hrs), Avg:97.9 F (36.6 C), Min:97.5 F (36.4 C), Max:98.1 F (36.7 C)  Recent Labs  Lab 04/22/18 1159 04/22/18 1634 04/23/18 0534  WBC 14.7* 13.4* 12.8*  CREATININE 0.99 0.95 0.92    Estimated Creatinine Clearance: 56.2 mL/min (by C-G formula based on SCr of 0.92 mg/dL).    Allergies  Allergen Reactions  . Neosporin [Neomycin-Bacitracin Zn-Polymyx] Other (See Comments)    Makes his skin red   . Penicillins Hives and Itching    Has patient had a PCN reaction causing immediate rash, facial/tongue/throat swelling, SOB or lightheadedness with hypotension: Yes Has patient had a PCN reaction causing severe rash involving mucus membranes or skin necrosis: No Has patient had a PCN reaction that required hospitalization: Unknown Has patient had a PCN reaction occurring within the last 10 years: Unknown If all of the above answers are "NO", then may proceed with Cephalosporin use.   Marland Kitchen Penicillin G Rash    Antimicrobials this admission: CTX 8/7 x 1 Cefepime 8/7 >>8/9 Cefazolin 8/9 >>  Microbiology results: 8/7 UCx >> 20k proteus (pan-S except R-Cipro/Macrobid/Bactrim)  Thank you for allowing pharmacy to be a part of this patient's care.  Alycia Rossetti, PharmD, BCPS Clinical Pharmacist Pager: [phone] Clinical phone for 04/24/2018 from 7a-3:30p: [phone] If after 3:30p, please call main pharmacy at: [phone] Please check AMION for all [place] numbers 04/24/2018 11:45 AM

## 2018-04-24 NOTE — Progress Notes (Addendum)
PROGRESS NOTE  STARR ENGEL ZOX:[id number] DOB: 07-09-1927 DOA: 04/22/2018 PCP: Gregor Hams, FNP  Brief Narrative: 84 year old man PMH dementia, chronic hypoxic respiratory failure, presented with history of fall and confusion at nursing home.  Admitted for Pseudomonas UTI, catheter associated, complicated, with associated acute encephalopathy.  Assessment/Plan Catheter associated UTI, Proteus mirabilis, with associated acute encephalopathy --Appears to be improving rapidly.  Remains afebrile.  Will narrow antibiotics to cefazolin, can likely change to oral antibiotics next 24 hours if continues to improve.  Suspect mentally he is close to baseline.   Borderline hypotension.  Suspect artifactual, will check manual BP, give IV fluids and monitor.  Completely asymptomatic.  No evidence of suggest sepsis.  PMH bladder cancer, chronic indwelling Foley catheter for 4 months  Atrial fibrillation, chronic systolic CHF.  Unable to tolerate beta-blocker secondary to significant bradycardia.  Not an anticoagulation candidate secondary to recurrent falls. --Appears stable.  Currently in sinus rhythm.  Brief episode of nonsustained VT.  Not a candidate for beta-blocker.   Possible syncope in nursing home versus fall.  Patient poor historian.  CT head and CT cervical spine without acute findings.  Doubt cardiogenic in nature. --Will consult physical therapy to assess mobility.  Chronic hypoxic respiratory failure on 2 L, COPD --Appears stable.  Sacral unstageable pressure injuries, present on admission --Continue management per wound care nurse  ADDENDUM 1500 RN reported asymptomatic hypotension.  Patient was given bolus but hypotension persisted.  I evaluated the patient at bedside he appeared unchanged from this morning.  Awake, alert with normal radial and dorsalis pedis pulses.  Prefer he should not appear intact.  Review of those records did demonstrate at least 2 instances of hypotension  on office visits Feb 10, 2018 and June 02, 2017.  Patient was given additional fluids and blood pressure appears to be over 100 now.  There is no evidence to suggest sepsis or acute decompensation I suspect some element of artifact in these measurements.  We will continue to monitor clinically rather than treat number further.  DVT prophylaxis: heparin Code Status: Full Family Communication: son by telephone, agrees patient mentally close to baseline; wife by telephone Disposition Plan: return to [facility]     Murray Hodgkins, MD  [facility] Direct contact: [phone] --Via amion app OR  --[email or web address]; password [email or web address]  7PM-7AM contact night coverage as above 04/24/2018, 12:32 PM  LOS: 2 days   Consultants:    Procedures:    Antimicrobials:  Cefepime 8/7 >> 8/9  Cefazolin 8/9 >>  Interval history/Subjective: Chart reviewed; I was not notified of low BP. Patient has no complaints.  Objective: Vitals:  Vitals:   04/24/18 0652 04/24/18 0814  BP: (!) 83/55 (!) 85/56  Pulse: 79 78  Resp:  18  Temp:  (!) 97.5 F (36.4 C)  SpO2: 95% 100%    Exam: Constitutional:   . Appears calm and comfortable; awake, alert. Eyes:  . pupils appear normal ENMT:  . Slightly hard of hearing Respiratory:  . CTA bilaterally, no w/r/r.  . Respiratory effort normal. Cardiovascular:  . RRR, no m/r/g . No LE extremity edema   Abdomen:  . Nontender, nondistended Musculoskeletal:  . Moves all extremities Psychiatric:  . Mental status o Mood, affect appropriate o Oriented to self, hospital  I have personally reviewed the following:   Labs:  Urine culture growing Proteus mirabilis, sensitivities noted.  Scheduled Meds: . aspirin EC  81 mg Oral Daily  . atorvastatin  20 mg Oral QPM  .  Chlorhexidine Gluconate Cloth  6 each Topical Q0600  . collagenase   Topical Daily  . heparin  5,000 Units Subcutaneous Q8H  . hydrOXYzine  25 mg Oral TID  . mupirocin  ointment  1 application Nasal BID  . sodium chloride flush  3 mL Intravenous Q12H   Continuous Infusions: . sodium chloride    .  ceFAZolin (ANCEF) IV      Principal Problem:   UTI (urinary tract infection), bacterial/CAUTI/Pseudomonas Active Problems:   Hypertension   Atrial fibrillation (HCC)   Acute metabolic encephalopathy   Chronic systolic (congestive) heart failure (HCC)   Metabolic encephalopathy   UTI (urinary tract infection)   Pressure injury of skin   Chronic respiratory failure with hypoxia (HCC)   Sacral pressure ulcer   LOS: 2 days

## 2018-04-24 NOTE — Progress Notes (Signed)
MD made aware of pt low b/p-83/55. Awaiting orders and will continue to monitor. Will inform incoming RN.

## 2018-04-24 NOTE — Evaluation (Signed)
Physical Therapy Evaluation Patient Details Name: [name] MRN: [id number] DOB: October 10, 1926 Today's Date: 04/24/2018   History of Present Illness  Pt is an 84 y/o male admitted secondary to fall and increased confusion. CT of head and C spine negative for acute abnormality. PMH includes a fib, HTN, dementia, CHF, and COPD on 2L of oxygen.   Clinical Impression  Pt is admitted secondary to problem above with deficits below. Pt with cognitive deficits at baseline and presenting with weakness, decreased balance, and orthostatics. Pt requiring min to mod A for bed mobility this session. BP in supine was at 77/61 mmHg, and upon sitting BP decreased to 56/43 mmHg, so returned to supine. Pt asymptomatic throughout. RN in room and checked BP manually and BP at 82/52 mmHg. RN to address. Recommend return to SNF at d/c to increase independence and safety with mobility. Will continue to follow acutely to maximize functional mobility independence and safety.     Follow Up Recommendations SNF;Supervision/Assistance - 24 hour    Equipment Recommendations  None recommended by PT    Recommendations for Other Services       Precautions / Restrictions Precautions Precautions: Fall;Other (comment) Precaution Comments: Watch BP  Restrictions Weight Bearing Restrictions: No      Mobility  Bed Mobility Overal bed mobility: Needs Assistance Bed Mobility: Supine to Sit;Sit to Supine     Supine to sit: Mod assist Sit to supine: Min assist   General bed mobility comments: Mod A for trunk elevation and assist to scoot to EOB. Increased time required. BP in supine at 77/61 mmHg. Upon sitting, BP dropped to 56/43 mmHg, however, pt asymptomatic. Returned to supine. Min A for LE assist to return to supine. RN in room and took BP manually at end of session and BP at 82/52 mmHg.   Transfers                 General transfer comment: Deferred secondary to BP   Ambulation/Gait              General Gait Details: Deferred secondary to BP   Stairs            Wheelchair Mobility    Modified Rankin (Stroke Patients Only)       Balance Overall balance assessment: Needs assistance Sitting-balance support: No upper extremity supported;Feet supported Sitting balance-Leahy Scale: Fair                                       Pertinent Vitals/Pain Pain Assessment: No/denies pain    Home Living Family/patient expects to be discharged to:: Skilled nursing facility                      Prior Function Level of Independence: Needs assistance   Gait / Transfers Assistance Needed: Reports he needed assist for walking with RW  ADL's / Homemaking Assistance Needed: Reports he needed assist from staff for ADL tasks.         Hand Dominance        Extremity/Trunk Assessment   Upper Extremity Assessment Upper Extremity Assessment: Generalized weakness    Lower Extremity Assessment Lower Extremity Assessment: Generalized weakness    Cervical / Trunk Assessment Cervical / Trunk Assessment: Kyphotic  Communication   Communication: HOH  Cognition Arousal/Alertness: Awake/alert Behavior During Therapy: WFL for tasks assessed/performed Overall Cognitive Status: History of cognitive impairments -  at baseline                                        General Comments      Exercises     Assessment/Plan    PT Assessment Patient needs continued PT services  PT Problem List Decreased strength;Decreased balance;Decreased mobility;Decreased activity tolerance;Decreased knowledge of precautions;Decreased safety awareness;Decreased knowledge of use of DME;Decreased cognition       PT Treatment Interventions DME instruction;Gait training;Functional mobility training;Therapeutic activities;Therapeutic exercise;Balance training;Patient/family education    PT Goals (Current goals can be found in the Care Plan section)  Acute Rehab  PT Goals Patient Stated Goal: none stated  PT Goal Formulation: With patient Time For Goal Achievement: 05/08/18 Potential to Achieve Goals: Fair    Frequency Min 2X/week   Barriers to discharge        Co-evaluation               AM-PAC PT "6 Clicks" Daily Activity  Outcome Measure Difficulty turning over in bed (including adjusting bedclothes, sheets and blankets)?: A Little Difficulty moving from lying on back to sitting on the side of the bed? : Unable Difficulty sitting down on and standing up from a chair with arms (e.g., wheelchair, bedside commode, etc,.)?: Unable Help needed moving to and from a bed to chair (including a wheelchair)?: A Lot Help needed walking in hospital room?: A Lot Help needed climbing 3-5 steps with a railing? : Total 6 Click Score: 10    End of Session Equipment Utilized During Treatment: Oxygen Activity Tolerance: Treatment limited secondary to medical complications (Comment)(low BP ) Patient left: in bed;with call bell/phone within reach;with bed alarm set;with nursing/sitter in room Nurse Communication: Mobility status;Other (comment)(low BP ) PT Visit Diagnosis: Unsteadiness on feet (R26.81);Muscle weakness (generalized) (M62.81);History of falling (Z91.81)    Time: 3710-6269 PT Time Calculation (min) (ACUTE ONLY): 22 min   Charges:   PT Evaluation $PT Eval Moderate Complexity: 1 Mod          Leighton Ruff, PT, DPT  Acute Rehabilitation Services  Pager: [phone]   Rudean Hitt 04/24/2018, 4:12 PM

## 2018-04-24 NOTE — Progress Notes (Signed)
Pts. B/P manually was 82/52. Patient is asymptomatic. MD notified. MD to place orders for 500 cc bolus. Orders to be followed. Will continue to monitor.

## 2018-04-25 MED ORDER — CEFUROXIME AXETIL 500 MG PO TABS: 500.0000 mg | ORAL_TABLET | Freq: Two times a day (BID) | ORAL | 0 refills | Status: DC

## 2018-04-25 MED ORDER — COLLAGENASE 250 UNIT/GM EX OINT: TOPICAL_OINTMENT | Freq: Every day | CUTANEOUS | Status: DC

## 2018-04-25 MED ORDER — COLLAGENASE 250 UNIT/GM EX OINT: TOPICAL_OINTMENT | Freq: Every day | CUTANEOUS | 0 refills | Status: AC

## 2018-04-25 MED ORDER — CEFUROXIME AXETIL 500 MG PO TABS: 500.0000 mg | ORAL_TABLET | Freq: Two times a day (BID) | ORAL | 0 refills | Status: AC

## 2018-04-25 NOTE — [place] FL2 (Addendum)
[place] MEDICAID FL2 LEVEL OF CARE SCREENING TOOL     IDENTIFICATION  Patient Name: [name] Birthdate: 06-11-1927 Sex: male Admission Date (Current Location): 04/22/2018  [facility] and [place] Number:  Herbalist and Address:  The . [facility], [place] [address], [place], [place] [address]      Provider Number: [id number]  Attending Physician Name and Address:  Samuella Cota, MD  Relative Name and Phone Number:       Current Level of Care: Hospital Recommended Level of Care: [place] Prior Approval Number:    Date Approved/Denied:   PASRR Number:    Discharge Plan: Other (Comment)(ALF)    Current Diagnoses: Patient Active Problem List   Diagnosis Date Noted  . Pressure injury of skin 04/23/2018  . Chronic respiratory failure with hypoxia ([place]) 04/23/2018  . Sacral pressure ulcer 04/23/2018  . UTI (urinary tract infection), bacterial/CAUTI/Pseudomonas 04/22/2018  . Metabolic encephalopathy 72/05/4708  . UTI (urinary tract infection) 04/22/2018  . E. coli UTI (urinary tract infection) 03/10/2018  . Acute metabolic encephalopathy 62/83/6629  . Acute kidney injury ([place]) 03/10/2018  . Chronic systolic (congestive) heart failure ([place]) 03/10/2018  . Pneumonia 02/24/2018  . Pleural effusion on right 12/05/2017  . Acute respiratory failure with hypoxia ([place]) 12/05/2017  . Memory difficulty 05/26/2017  . Gait abnormality 05/26/2017  . Constipation 01/03/2017  . Atrial fibrillation ([place]) 01/03/2017  . Hyponatremia 01/03/2017  . First degree AV block 06/28/2013  . Bladder cancer ([place]) 07/01/2012  . Cataracts, bilateral 03/14/2011  . Ventricular bigeminy   . Nonsustained ventricular tachycardia ([place])   . Nonobstructive CAD (coronary artery disease)   . Hypertension   . Hyperlipidemia     Orientation RESPIRATION BLADDER Height & Weight     Self  O2([place] 2L) Indwelling catheter Weight: 161 lb (73 kg) Height:      BEHAVIORAL SYMPTOMS/MOOD NEUROLOGICAL BOWEL NUTRITION STATUS      Continent Diet(heart healthy)  AMBULATORY STATUS COMMUNICATION OF NEEDS Skin   Extensive Assist Verbally PU Stage and Appropriate Care PU Stage 1 Dressing: Daily(unstageable pressure injuries: lower vertibral column with foam/gauze dressing; sacrum with foam, gauze, wet to dry dressing)                     Personal Care Assistance Level of Assistance  Bathing, Feeding, Dressing Bathing Assistance: Maximum assistance Feeding assistance: Limited assistance Dressing Assistance: Maximum assistance     Functional Limitations Info  Sight, Hearing, Speech Sight Info: Adequate Hearing Info: Impaired Speech Info: Adequate    SPECIAL CARE FACTORS FREQUENCY                       Contractures Contractures Info: Not present    Additional Factors Info  Code Status, Allergies, Isolation Precautions Code Status Info: Full Allergies Info: Neosporin Neomycin-bacitracin Zn-polymyx, Penicillins, Penicillin G     Isolation Precautions Info: Contact precautions, MRSA     Current Medications (04/25/2018):  This is the current hospital active medication list Current Facility-Administered Medications  Medication Dose Route Frequency Provider Last Rate Last Dose  . 0.9 %  sodium chloride infusion  250 mL Intravenous PRN Emokpae, Courage, MD      . 0.9 %  sodium chloride infusion   Intravenous Continuous Samuella Cota, MD 100 mL/hr at 04/25/18 1200    . acetaminophen (TYLENOL) tablet 650 mg  650 mg Oral Q6H PRN Roxan Hockey, MD  Or  . acetaminophen (TYLENOL) suppository 650 mg  650 mg Rectal Q6H PRN Emokpae, Courage, MD      . albuterol (PROVENTIL) (2.5 MG/3ML) 0.083% nebulizer solution 2.5 mg  2.5 mg Nebulization Q2H PRN Emokpae, Courage, MD      . aspirin EC tablet 81 mg  81 mg Oral Daily Emokpae, Courage, MD   81 mg at 04/25/18 1120  . atorvastatin (LIPITOR) tablet 20 mg  20 mg Oral QPM Emokpae, Courage,  MD   20 mg at 04/24/18 1755  . barrier cream (non-specified) 1 application  1 application Topical TID PRN Emokpae, Courage, MD      . ceFAZolin (ANCEF) IVPB 1 g/50 mL premix  1 g Intravenous Q8H Rolla Flatten, [facility]   Stopped at 04/25/18 1149  . Chlorhexidine Gluconate Cloth 2 % PADS 6 each  6 each Topical Q0600 Roxan Hockey, MD   6 each at 04/25/18 0617  . collagenase (SANTYL) ointment   Topical Daily Samuella Cota, MD      . heparin injection 5,000 Units  5,000 Units Subcutaneous Q8H Roxan Hockey, MD   5,000 Units at 04/25/18 0617  . hydrOXYzine (ATARAX/VISTARIL) tablet 25 mg  25 mg Oral TID Roxan Hockey, MD   25 mg at 04/25/18 1120  . mupirocin ointment (BACTROBAN) 2 % 1 application  1 application Nasal BID Roxan Hockey, MD   1 application at 79/89/21 1126  . ondansetron (ZOFRAN) tablet 4 mg  4 mg Oral Q6H PRN Emokpae, Courage, MD       Or  . ondansetron (ZOFRAN) injection 4 mg  4 mg Intravenous Q6H PRN Emokpae, Courage, MD      . polyethylene glycol (MIRALAX / GLYCOLAX) packet 17 g  17 g Oral Daily PRN Emokpae, Courage, MD      . prednisoLONE acetate (PRED FORTE) 1 % ophthalmic suspension 2 drop  2 drop Both Eyes BID PRN Emokpae, Courage, MD      . sodium chloride flush (NS) 0.9 % injection 3 mL  3 mL Intravenous Q12H Emokpae, Courage, MD   3 mL at 04/24/18 0939  . sodium chloride flush (NS) 0.9 % injection 3 mL  3 mL Intravenous PRN Emokpae, Courage, MD      . traZODone (DESYREL) tablet 50 mg  50 mg Oral QHS PRN Roxan Hockey, MD         Discharge Medications: Please see discharge summary for a list of discharge medications.  Relevant Imaging Results:  Relevant Lab Results:   Additional Information SSN  [id number] Contact precautions for MRSA, tested positive among admission.   Zaliah Wissner A Orrie Schubert, LCSW

## 2018-04-25 NOTE — Clinical Social Work Note (Signed)
CSW has attempted to reach pt's spouse several times. CSW spoke with pt's spouse earlier and pt's spouse was aware pt would d/c to facility today. At this time [facility] has been called.  Loletha Grayer, MSW [phone]

## 2018-04-25 NOTE — Progress Notes (Signed)
Attempted to call [facility] at [phone] to provide report. No human response. Provided contact number to [facility]. Bartholomew Crews, RN

## 2018-04-25 NOTE — Discharge Summary (Signed)
Physician Discharge Summary  [name] UXL:[id number] DOB: 05/23/1927 DOA: 04/22/2018  PCP: Gregor Hams, FNP  Admit date: 04/22/2018 Discharge date: 04/25/2018  Recommendations for Outpatient Follow-up:  1. Resolution of infection   Follow-up Information    Gregor Hams, FNP. Schedule an appointment as soon as possible for a visit in 1 week(s).   Specialty:  Family Medicine Contact information: [place] [address] [phone]            Discharge Diagnoses:  1. Catheter associated UTI, Proteus mirabilis, with associated acute encephalopathy 2. Borderline hypotension. Intermittent, resolved at this point.  Asymptomatic.  Has been seen intermittently in the past, see progress note from 8/9 for further details.  Currently normotensive.  No further evaluation suggested. 3. PMH bladder cancer, chronic indwelling Foley catheter Atrial fibrillation 4. Chronic systolic CHF 5. Possible syncope in nursing home versus fall. 6. Chronic hypoxic respiratory failure on 2 L, COPD 7. Sacral unstageable pressure injuries  Discharge Condition: improved Disposition: home  Diet recommendation: heart healthy  Filed Weights   04/24/18 0510  Weight: 73 kg    History of present illness:  84 year old man PMH dementia, chronic hypoxic respiratory failure, presented with history of fall and confusion at nursing home.  Admitted for UTI, catheter associated, complicated, with associated acute encephalopathy.  Hospital Course:  Patient was treated with empiric antibiotics with gradual clinical improvement.  Hospitalization was uncomplicated, individual issues as below.  Catheter associated UTI, Proteus mirabilis, with associated acute encephalopathy --Appears resolved at this point.  Remains afebrile.  Normotensive.    Borderline hypotension. Intermittent, resolved at this point.  Asymptomatic.  Has been seen intermittently in the past, see progress note  from 8/9 for further details.  Currently normotensive.  No further evaluation suggested.  PMH bladder cancer, chronic indwelling Foley catheter for 4 months  Atrial fibrillation, chronic systolic CHF.  Unable to tolerate beta-blocker secondary to significant bradycardia.  Not an anticoagulation candidate secondary to recurrent falls. --Able, currently in sinus rhythm. Not a candidate for beta-blocker.   Possible syncope in nursing home versus fall.  Patient poor historian.  CT head and CT cervical spine without acute findings.  Doubt cardiogenic in nature. --No further evaluation suggested.  Return to skilled nursing facility.  Chronic hypoxic respiratory failure on 2 L, COPD --Stable during hospitalization.  Sacral unstageable pressure injuries, present on admission --Continue management per wound care nurse  Antimicrobials:  Cefepime 8/7 >> 8/9  Cefazolin 8/9 >> 8/10  Ceftin 8/10 >> 8/13  Today's assessment: S: feels fine, no complaints O: Vitals:  Vitals:   04/25/18 0523 04/25/18 0842  BP: 108/67 137/74  Pulse: 79 79  Resp: 11 18  Temp: 97.6 F (36.4 C) 97.7 F (36.5 C)  SpO2: 100% 100%    Constitutional:  . Appears calm and comfortable Respiratory:  . CTA bilaterally, no w/r/r.  . Respiratory effort normal.  Cardiovascular:  . RRR, no m/r/g . No LE extremity edema   Musculoskeletal:  . RUE, LUE, RLE, LLE   . Moves all extremities  Psychiatric:  . Mental status o Mood, affect appropriate  No labs today  Discharge Instructions   Allergies as of 04/25/2018      Reactions   Neosporin [neomycin-bacitracin Zn-polymyx] Other (See Comments)   Makes his skin red   Penicillins Hives, Itching   Has patient had a PCN reaction causing immediate rash, facial/tongue/throat swelling, SOB or lightheadedness with hypotension: Yes Has patient had a PCN reaction  causing severe rash involving mucus membranes or skin necrosis: No Has patient had a PCN reaction that  required hospitalization: Unknown Has patient had a PCN reaction occurring within the last 10 years: Unknown If all of the above answers are "NO", then may proceed with Cephalosporin use.   Penicillin G Rash      Medication List    STOP taking these medications   ciprofloxacin 500 MG tablet Commonly known as:  CIPRO     TAKE these medications   acetaminophen 500 MG tablet Commonly known as:  TYLENOL Take 500 mg by mouth every 6 (six) hours as needed for mild pain.   aspirin EC 81 MG tablet Take 81 mg by mouth daily.   atorvastatin 20 MG tablet Commonly known as:  LIPITOR Take 20 mg by mouth every evening.   barrier cream Crea Commonly known as:  non-specified Apply 1 application topically 3 (three) times daily as needed (sacrum and groin redness).   cefUROXime 500 MG tablet Commonly known as:  CEFTIN Take 1 tablet (500 mg total) by mouth 2 (two) times daily for 6 doses.   collagenase ointment Commonly known as:  SANTYL Apply topically daily. Apply Santyl to upper and lower sacrum wounds Q day, then cover with moist gauze dressing and foam dressing.  (Change foam dressing Q 3 days or PRN soiling.)   furosemide 20 MG tablet Commonly known as:  LASIX Take 1 tablet (20 mg total) by mouth daily.   hydrOXYzine 25 MG tablet Commonly known as:  ATARAX/VISTARIL Take 25 mg by mouth 3 (three) times daily.   OXYGEN Inhale 2 L into the lungs continuous.   prednisoLONE acetate 1 % ophthalmic suspension Commonly known as:  PRED FORTE Place 2 drops into both eyes 2 (two) times daily as needed (allergies).      Allergies  Allergen Reactions  . Neosporin [Neomycin-Bacitracin Zn-Polymyx] Other (See Comments)    Makes his skin red   . Penicillins Hives and Itching    Has patient had a PCN reaction causing immediate rash, facial/tongue/throat swelling, SOB or lightheadedness with hypotension: Yes Has patient had a PCN reaction causing severe rash involving mucus membranes or  skin necrosis: No Has patient had a PCN reaction that required hospitalization: Unknown Has patient had a PCN reaction occurring within the last 10 years: Unknown If all of the above answers are "NO", then may proceed with Cephalosporin use.   Marland Kitchen Penicillin G Rash    The results of significant diagnostics from this hospitalization (including imaging, microbiology, ancillary and laboratory) are listed below for reference.    Significant Diagnostic Studies: Ct Head Wo Contrast  Result Date: 04/22/2018 CLINICAL DATA:  Unwitnessed fall with altered mental status. Last seen normal 2 days ago. EXAM: CT HEAD WITHOUT CONTRAST CT CERVICAL SPINE WITHOUT CONTRAST TECHNIQUE: Multidetector CT imaging of the head and cervical spine was performed following the standard protocol without intravenous contrast. Multiplanar CT image reconstructions of the cervical spine were also generated. COMPARISON:  04/20/2018 FINDINGS: CT HEAD FINDINGS Brain: Mild generalized atrophy. No sign of acute infarction, mass lesion, hemorrhage, hydrocephalus or extra-axial collection. Vascular: There is atherosclerotic calcification of the major vessels at the base of the brain. Skull: No skull fracture. Sinuses/Orbits: Inflammatory changes throughout the sinuses similar to the previous study. Orbits negative. Other: None CT CERVICAL SPINE FINDINGS Alignment: Mild curvature convex to the left. Straightening of the normal cervical lordosis. Skull base and vertebrae: No fracture or traumatic malalignment. Soft tissues and spinal canal: No  soft tissue injury or significant finding. Carotid calcification. Disc levels: Osteoarthritis at the C1-2 articulation. Facet osteoarthritis on the right at C2-3, C3-4, C4-5 and C7-T1. Facet osteoarthritis on the left at C2-3, C3-4 and C7-T1. Degenerative spondylosis with disc space narrowing and endplate osteophytes at all levels within the cervical region, most pronounced at C3-4, C4-5, C5-6 and C6-7. No  compressive central canal stenosis identified. Foraminal narrowing that could cause neural compression most pronounced on the left at C2-3, left more than right at C3-4, right more than left at C4-5, left more than right at C5-6 and bilateral at C6-7. Upper chest: Negative Other: None IMPRESSION: Head CT: No acute or traumatic finding.  Mild age related atrophy. Cervical spine CT: No acute or traumatic finding. Advanced chronic degenerative changes as outlined above with foraminal stenoses. Electronically Signed   By: Nelson Chimes M.D.   On: 04/22/2018 13:26   Ct Cervical Spine Wo Contrast  Result Date: 04/22/2018 CLINICAL DATA:  Unwitnessed fall with altered mental status. Last seen normal 2 days ago. EXAM: CT HEAD WITHOUT CONTRAST CT CERVICAL SPINE WITHOUT CONTRAST TECHNIQUE: Multidetector CT imaging of the head and cervical spine was performed following the standard protocol without intravenous contrast. Multiplanar CT image reconstructions of the cervical spine were also generated. COMPARISON:  04/20/2018 FINDINGS: CT HEAD FINDINGS Brain: Mild generalized atrophy. No sign of acute infarction, mass lesion, hemorrhage, hydrocephalus or extra-axial collection. Vascular: There is atherosclerotic calcification of the major vessels at the base of the brain. Skull: No skull fracture. Sinuses/Orbits: Inflammatory changes throughout the sinuses similar to the previous study. Orbits negative. Other: None CT CERVICAL SPINE FINDINGS Alignment: Mild curvature convex to the left. Straightening of the normal cervical lordosis. Skull base and vertebrae: No fracture or traumatic malalignment. Soft tissues and spinal canal: No soft tissue injury or significant finding. Carotid calcification. Disc levels: Osteoarthritis at the C1-2 articulation. Facet osteoarthritis on the right at C2-3, C3-4, C4-5 and C7-T1. Facet osteoarthritis on the left at C2-3, C3-4 and C7-T1. Degenerative spondylosis with disc space narrowing and  endplate osteophytes at all levels within the cervical region, most pronounced at C3-4, C4-5, C5-6 and C6-7. No compressive central canal stenosis identified. Foraminal narrowing that could cause neural compression most pronounced on the left at C2-3, left more than right at C3-4, right more than left at C4-5, left more than right at C5-6 and bilateral at C6-7. Upper chest: Negative Other: None IMPRESSION: Head CT: No acute or traumatic finding.  Mild age related atrophy. Cervical spine CT: No acute or traumatic finding. Advanced chronic degenerative changes as outlined above with foraminal stenoses. Electronically Signed   By: Nelson Chimes M.D.   On: 04/22/2018 13:26   Microbiology: Recent Results (from the past 240 hour(s))  Culture, blood (routine x 2)     Status: None (Preliminary result)   Collection Time: 04/22/18  3:25 PM  Result Value Ref Range Status   Specimen Description BLOOD RIGHT ANTECUBITAL  Final   Special Requests   Final    BOTTLES DRAWN AEROBIC AND ANAEROBIC Blood Culture results may not be optimal due to an excessive volume of blood received in culture bottles   Culture   Final    NO GROWTH 3 DAYS Performed at [facility], [place] [address], [place], [place] [address]    Report Status PENDING  Incomplete  Culture, blood (Routine X 2) w Reflex to ID Panel     Status: None (Preliminary result)   Collection Time: 04/22/18  3:35 PM  Result Value Ref Range Status   Specimen Description BLOOD RIGHT HAND  Final   Special Requests   Final    BOTTLES DRAWN AEROBIC AND ANAEROBIC Blood Culture results may not be optimal due to an excessive volume of blood received in culture bottles   Culture   Final    NO GROWTH 3 DAYS Performed at [facility] [address] [place], [place] [address]    Report Status PENDING  Incomplete  Urine culture     Status: Abnormal   Collection Time: 04/22/18  4:34 PM  Result Value Ref Range Status   Specimen Description URINE, RANDOM   Final   Special Requests   Final    NONE Performed at [facility], [place] [address] [place], [place] [address]    Culture 20,000 COLONIES/mL PROTEUS MIRABILIS (A)  Final   Report Status 04/24/2018 FINAL  Final   Organism ID, Bacteria PROTEUS MIRABILIS (A)  Final      Susceptibility   Proteus mirabilis - MIC*    AMPICILLIN <=2 SENSITIVE Sensitive     CEFAZOLIN <=4 SENSITIVE Sensitive     CEFTRIAXONE <=1 SENSITIVE Sensitive     CIPROFLOXACIN >=4 RESISTANT Resistant     GENTAMICIN <=1 SENSITIVE Sensitive     IMIPENEM 2 SENSITIVE Sensitive     NITROFURANTOIN 128 RESISTANT Resistant     TRIMETH/SULFA >=320 RESISTANT Resistant     AMPICILLIN/SULBACTAM <=2 SENSITIVE Sensitive     PIP/TAZO <=4 SENSITIVE Sensitive     * 20,000 COLONIES/mL PROTEUS MIRABILIS  MRSA PCR Screening     Status: Abnormal   Collection Time: 04/22/18  9:38 PM  Result Value Ref Range Status   MRSA by PCR POSITIVE (A) NEGATIVE Final    Comment:        The GeneXpert MRSA Assay (FDA approved for NASAL specimens only), is one component of a comprehensive MRSA colonization surveillance program. It is not intended to diagnose MRSA infection nor to guide or monitor treatment for MRSA infections. RESULT CALLED TO, READ BACK BY AND VERIFIED WITH: Gypsy Lore RN [id number] 04/23/18 A BROWNING Performed at [facility], [place] [address], [place],  [address]      Labs: Basic Metabolic Panel: Recent Labs  Lab 04/22/18 1159 04/22/18 1634 04/23/18 0534  NA 134*  --  138  K 3.8  --  3.5  CL 92*  --  94*  CO2 30  --  32  GLUCOSE 124*  --  97  BUN 8  --  8  CREATININE 0.99 0.95 0.92  CALCIUM 8.7*  --  8.7*   CBC: Recent Labs  Lab 04/22/18 1159 04/22/18 1634 04/23/18 0534  WBC 14.7* 13.4* 12.8*  NEUTROABS 10.0*  --   --   HGB 13.2 12.7* 12.6*  HCT 41.0 39.9 39.3  MCV 93.2 95.0 95.2  PLT 295 297 357    Principal Problem:   UTI (urinary tract infection), bacterial/CAUTI/Pseudomonas Active  Problems:   Hypertension   Atrial fibrillation (HCC)   Acute metabolic encephalopathy   Chronic systolic (congestive) heart failure (HCC)   Metabolic encephalopathy   UTI (urinary tract infection)   Pressure injury of skin   Chronic respiratory failure with hypoxia (HCC)   Sacral pressure ulcer   Time coordinating discharge: 25 minutes  Signed:  Murray Hodgkins, MD [facility] 04/25/2018, 11:13 AM

## 2018-04-25 NOTE — Clinical Social Work Placement (Signed)
   CLINICAL SOCIAL WORK PLACEMENT  NOTE  Date:  04/25/2018  Patient Details  Name: QUADRY KAMPA MRN: [id number] Date of Birth: 08-04-1927  Clinical Social Work is seeking post-discharge placement for this patient at the [place] level of care (*CSW will initial, date and re-position this form in  chart as items are completed):      Patient/family provided with [place] Work Department's list of facilities offering this level of care within the geographic area requested by the patient (or if unable, by the patient's family).      Patient/family informed of their freedom to choose among providers that offer the needed level of care, that participate in Medicare, Medicaid or managed care program needed by the patient, have an available bed and are willing to accept the patient.      Patient/family informed of [facility]'s ownership interest in [facility] and [facility], as well as of the fact that they are under no obligation to receive care at these facilities.  PASRR submitted to EDS on       PASRR number received on       Existing PASRR number confirmed on       FL2 transmitted to all facilities in geographic area requested by pt/family on       FL2 transmitted to all facilities within larger geographic area on       Patient informed that his/her managed care company has contracts with or will negotiate with certain facilities, including the following:            Patient/family informed of bed offers received.  Patient chooses bed at [facility] in [place])     Physician recommends and patient chooses bed at      Patient to be transferred to [facility] in [place]) on 04/25/18.  Patient to be transferred to facility by [facility]     Patient family notified on 04/25/18 of transfer.  Name of family member notified:  [place]     PHYSICIAN       Additional Comment:    _______________________________________________ Eileen Stanford, LCSW 04/25/2018, 3:08 PM

## 2018-04-25 NOTE — Clinical Social Work Note (Signed)
CSW spoke with pt's spouse via telephone. Pt's spouse confirmed pt will return to [place] today. CSW will set up transport.   Loletha Grayer, MSW [phone]

## 2018-04-25 NOTE — Clinical Social Work Note (Signed)
Clinical Social Worker facilitated patient discharge including contacting patient family and facility to confirm patient discharge plans.  Clinical information faxed to facility and family agreeable with plan.  CSW arranged ambulance transport via [facility] to [facility].  RN to call [phone] for report prior to discharge. RN PLEASE SEND ANY NEW SCRIPTS WITH PT.  Clinical Social Worker will sign off for now as social work intervention is no longer needed. Please consult [place] again if new need arises.  Loletha Grayer, MSW [phone]

## 2018-04-27 LAB — CULTURE, BLOOD (ROUTINE X 2)
Culture: NO GROWTH
Culture: NO GROWTH

## 2018-05-19 ENCOUNTER — Ambulatory Visit: Payer: Medicare HMO | Admitting: Adult Health

## 2018-05-19 ENCOUNTER — Encounter

## 2018-05-28 ENCOUNTER — Emergency Department (HOSPITAL_COMMUNITY): Payer: Medicare HMO

## 2018-05-28 ENCOUNTER — Other Ambulatory Visit: Payer: Self-pay

## 2018-05-28 ENCOUNTER — Encounter (HOSPITAL_COMMUNITY): Payer: Self-pay | Admitting: Emergency Medicine

## 2018-05-28 ENCOUNTER — Other Ambulatory Visit: Payer: Self-pay | Admitting: Emergency Medicine

## 2018-05-28 ENCOUNTER — Other Ambulatory Visit (HOSPITAL_COMMUNITY): Payer: Self-pay | Admitting: Emergency Medicine

## 2018-05-28 ENCOUNTER — Observation Stay (HOSPITAL_COMMUNITY)
Admission: EM | Admit: 2018-05-28 | Discharge: 2018-05-30 | Disposition: A | Discharge status: Nursing Home (Includes ICF) | Payer: Medicare HMO | Attending: Internal Medicine | Admitting: Internal Medicine

## 2018-05-28 DIAGNOSIS — E876 Hypokalemia: Secondary | ICD-10-CM | POA: Insufficient documentation

## 2018-05-28 DIAGNOSIS — E785 Hyperlipidemia, unspecified: Secondary | ICD-10-CM | POA: Diagnosis not present

## 2018-05-28 DIAGNOSIS — F0280 Dementia in other diseases classified elsewhere without behavioral disturbance: Secondary | ICD-10-CM | POA: Diagnosis not present

## 2018-05-28 DIAGNOSIS — L89159 Pressure ulcer of sacral region, unspecified stage: Secondary | ICD-10-CM | POA: Diagnosis not present

## 2018-05-28 DIAGNOSIS — Z9221 Personal history of antineoplastic chemotherapy: Secondary | ICD-10-CM | POA: Insufficient documentation

## 2018-05-28 DIAGNOSIS — I2510 Atherosclerotic heart disease of native coronary artery without angina pectoris: Secondary | ICD-10-CM | POA: Insufficient documentation

## 2018-05-28 DIAGNOSIS — Z66 Do not resuscitate: Secondary | ICD-10-CM | POA: Insufficient documentation

## 2018-05-28 DIAGNOSIS — I130 Hypertensive heart and chronic kidney disease with heart failure and stage 1 through stage 4 chronic kidney disease, or unspecified chronic kidney disease: Secondary | ICD-10-CM | POA: Insufficient documentation

## 2018-05-28 DIAGNOSIS — Z923 Personal history of irradiation: Secondary | ICD-10-CM | POA: Diagnosis not present

## 2018-05-28 DIAGNOSIS — S80211A Abrasion, right knee, initial encounter: Secondary | ICD-10-CM | POA: Diagnosis not present

## 2018-05-28 DIAGNOSIS — N390 Urinary tract infection, site not specified: Principal | ICD-10-CM | POA: Insufficient documentation

## 2018-05-28 DIAGNOSIS — H409 Unspecified glaucoma: Secondary | ICD-10-CM | POA: Insufficient documentation

## 2018-05-28 DIAGNOSIS — Z9181 History of falling: Secondary | ICD-10-CM | POA: Insufficient documentation

## 2018-05-28 DIAGNOSIS — N3000 Acute cystitis without hematuria: Secondary | ICD-10-CM

## 2018-05-28 DIAGNOSIS — Z87440 Personal history of urinary (tract) infections: Secondary | ICD-10-CM | POA: Diagnosis not present

## 2018-05-28 DIAGNOSIS — N183 Chronic kidney disease, stage 3 (moderate): Secondary | ICD-10-CM | POA: Insufficient documentation

## 2018-05-28 DIAGNOSIS — W19XXXA Unspecified fall, initial encounter: Secondary | ICD-10-CM

## 2018-05-28 DIAGNOSIS — G9341 Metabolic encephalopathy: Secondary | ICD-10-CM | POA: Diagnosis not present

## 2018-05-28 DIAGNOSIS — T83511A Infection and inflammatory reaction due to indwelling urethral catheter, initial encounter: Secondary | ICD-10-CM

## 2018-05-28 DIAGNOSIS — I440 Atrioventricular block, first degree: Secondary | ICD-10-CM | POA: Diagnosis not present

## 2018-05-28 DIAGNOSIS — Z8551 Personal history of malignant neoplasm of bladder: Secondary | ICD-10-CM | POA: Insufficient documentation

## 2018-05-28 DIAGNOSIS — C61 Malignant neoplasm of prostate: Secondary | ICD-10-CM | POA: Diagnosis not present

## 2018-05-28 DIAGNOSIS — Z880 Allergy status to penicillin: Secondary | ICD-10-CM | POA: Insufficient documentation

## 2018-05-28 DIAGNOSIS — I10 Essential (primary) hypertension: Secondary | ICD-10-CM | POA: Diagnosis not present

## 2018-05-28 DIAGNOSIS — S50311A Abrasion of right elbow, initial encounter: Secondary | ICD-10-CM | POA: Diagnosis not present

## 2018-05-28 DIAGNOSIS — I5022 Chronic systolic (congestive) heart failure: Secondary | ICD-10-CM | POA: Insufficient documentation

## 2018-05-28 DIAGNOSIS — Z79899 Other long term (current) drug therapy: Secondary | ICD-10-CM | POA: Insufficient documentation

## 2018-05-28 DIAGNOSIS — N179 Acute kidney failure, unspecified: Secondary | ICD-10-CM | POA: Insufficient documentation

## 2018-05-28 DIAGNOSIS — Z993 Dependence on wheelchair: Secondary | ICD-10-CM | POA: Diagnosis not present

## 2018-05-28 LAB — BASIC METABOLIC PANEL
ANION GAP: 20 — AB (ref 5–15)
ANION GAP: 13 (ref 5–15)
Anion gap: 11 (ref 5–15)
BUN: 7 mg/dL — ABNORMAL LOW (ref 8–23)
BUN: 7 mg/dL — ABNORMAL LOW (ref 8–23)
BUN: 7 mg/dL — AB (ref 8–23)
CALCIUM: 8.8 mg/dL — AB (ref 8.9–10.3)
CALCIUM: 8.6 mg/dL — AB (ref 8.9–10.3)
CHLORIDE: 91 mmol/L — AB (ref 98–111)
CO2: 29 mmol/L (ref 22–32)
CO2: 36 mmol/L — ABNORMAL HIGH (ref 22–32)
CO2: 33 mmol/L — ABNORMAL HIGH (ref 22–32)
CREATININE: 1.19 mg/dL (ref 0.61–1.24)
Calcium: 9.1 mg/dL (ref 8.9–10.3)
Chloride: 88 mmol/L — ABNORMAL LOW (ref 98–111)
Chloride: 90 mmol/L — ABNORMAL LOW (ref 98–111)
Creatinine, Ser: 1.23 mg/dL (ref 0.61–1.24)
Creatinine, Ser: 1.1 mg/dL (ref 0.61–1.24)
GFR calc Af Amer: 58 mL/min — ABNORMAL LOW (ref >60)
GFR calc Af Amer: >60 mL/min (ref >60)
GFR calc Af Amer: >60 mL/min (ref >60)
GFR calc non Af Amer: 52 mL/min — ABNORMAL LOW (ref >60)
GFR, EST NON AFRICAN AMERICAN: 50 mL/min — AB (ref >60)
GFR, EST NON AFRICAN AMERICAN: 57 mL/min — AB (ref >60)
GLUCOSE: 118 mg/dL — AB (ref 70–99)
Glucose, Bld: 110 mg/dL — ABNORMAL HIGH (ref 70–99)
Glucose, Bld: 109 mg/dL — ABNORMAL HIGH (ref 70–99)
POTASSIUM: 2.7 mmol/L — AB (ref 3.5–5.1)
POTASSIUM: 2.9 mmol/L — AB (ref 3.5–5.1)
Potassium: 2.5 mmol/L — CL (ref 3.5–5.1)
SODIUM: 137 mmol/L (ref 135–145)
Sodium: 137 mmol/L (ref 135–145)
Sodium: 137 mmol/L (ref 135–145)

## 2018-05-28 LAB — CBC
HEMATOCRIT: 42.8 % (ref 39.0–52.0)
HEMOGLOBIN: 13.8 g/dL (ref 13.0–17.0)
MCH: 28.7 pg (ref 26.0–34.0)
MCHC: 32.2 g/dL (ref 30.0–36.0)
MCV: 89 fL (ref 78.0–100.0)
Platelets: 318 10*3/uL (ref 150–400)
RBC: 4.81 MIL/uL (ref 4.22–5.81)
RDW: 12.3 % (ref 11.5–15.5)
WBC: 16.2 10*3/uL — AB (ref 4.0–10.5)

## 2018-05-28 LAB — I-STAT TROPONIN, ED
TROPONIN I, POC: 0.13 ng/mL — AB (ref 0.00–0.08)
Troponin i, poc: 0.11 ng/mL (ref 0.00–0.08)
Troponin i, poc: 0.13 ng/mL (ref 0.00–0.08)

## 2018-05-28 LAB — CBC WITH DIFFERENTIAL/PLATELET
ABS IMMATURE GRANULOCYTES: 0.1 10*3/uL (ref 0.0–0.1)
Basophils Absolute: 0.1 10*3/uL (ref 0.0–0.1)
Basophils Relative: 1 %
Eosinophils Absolute: 0.3 10*3/uL (ref 0.0–0.7)
Eosinophils Relative: 2 %
HCT: 40.6 % (ref 39.0–52.0)
Hemoglobin: 13.2 g/dL (ref 13.0–17.0)
Immature Granulocytes: 1 %
Lymphocytes Relative: 20 %
Lymphs Abs: 2.8 10*3/uL (ref 0.7–4.0)
MCH: 29.1 pg (ref 26.0–34.0)
MCHC: 32.5 g/dL (ref 30.0–36.0)
MCV: 89.4 fL (ref 78.0–100.0)
MONO ABS: 1.6 10*3/uL — AB (ref 0.1–1.0)
MONOS PCT: 11 %
NEUTROS ABS: 9.5 10*3/uL — AB (ref 1.7–7.7)
Neutrophils Relative %: 65 %
PLATELETS: 299 10*3/uL (ref 150–400)
RBC: 4.54 MIL/uL (ref 4.22–5.81)
RDW: 12.4 % (ref 11.5–15.5)
WBC: 14.4 10*3/uL — ABNORMAL HIGH (ref 4.0–10.5)

## 2018-05-28 LAB — URINALYSIS, MICROSCOPIC (REFLEX)

## 2018-05-28 LAB — URINALYSIS, ROUTINE W REFLEX MICROSCOPIC
Glucose, UA: NEGATIVE mg/dL
Ketones, ur: NEGATIVE mg/dL
Nitrite: NEGATIVE
Protein, ur: 100 mg/dL — AB
Specific Gravity, Urine: 1.02 (ref 1.005–1.030)
pH: 7.5 (ref 5.0–8.0)

## 2018-05-28 LAB — I-STAT CG4 LACTIC ACID, ED
LACTIC ACID, VENOUS: 1.49 mmol/L (ref 0.5–1.9)
Lactic Acid, Venous: 1.91 mmol/L — ABNORMAL HIGH (ref 0.5–1.9)

## 2018-05-28 LAB — TROPONIN I: TROPONIN I: 0.09 ng/mL — AB (ref <0.03)

## 2018-05-28 MED ORDER — ONDANSETRON HCL 4 MG PO TABS: 4.0000 mg | ORAL_TABLET | Freq: Four times a day (QID) | ORAL | Status: DC | PRN

## 2018-05-28 MED ORDER — ONDANSETRON HCL 4 MG/2ML IJ SOLN: 4.0000 mg | Freq: Four times a day (QID) | INTRAMUSCULAR | Status: DC | PRN

## 2018-05-28 MED ORDER — SODIUM CHLORIDE 0.9 % IV BOLUS
1000.0000 mL | Freq: Once | INTRAVENOUS | Status: AC
  Administered 2018-05-28: 1000 mL via INTRAVENOUS

## 2018-05-28 MED ORDER — POTASSIUM CHLORIDE 10 MEQ/100ML IV SOLN
10.0000 meq | INTRAVENOUS | Status: AC
  Administered 2018-05-28 (×4): 10 meq via INTRAVENOUS
  Filled 2018-05-28 (×3): qty 100

## 2018-05-28 MED ORDER — LACTATED RINGERS IV SOLN
INTRAVENOUS | Status: DC
  Administered 2018-05-28 – 2018-05-30 (×3): via INTRAVENOUS

## 2018-05-28 MED ORDER — OLANZAPINE 10 MG IM SOLR
5.0000 mg | Freq: Two times a day (BID) | INTRAMUSCULAR | Status: DC | PRN
  Filled 2018-05-28: qty 10

## 2018-05-28 MED ORDER — POTASSIUM CHLORIDE CRYS ER 20 MEQ PO TBCR: 40.0000 meq | EXTENDED_RELEASE_TABLET | Freq: Once | ORAL | Status: DC

## 2018-05-28 MED ORDER — ASPIRIN EC 81 MG PO TBEC
81.0000 mg | DELAYED_RELEASE_TABLET | Freq: Every day | ORAL | Status: DC
  Administered 2018-05-29 – 2018-05-30 (×2): 81 mg via ORAL
  Filled 2018-05-28 (×2): qty 1

## 2018-05-28 MED ORDER — HYDROXYZINE HCL 25 MG PO TABS: 25.0000 mg | ORAL_TABLET | Freq: Three times a day (TID) | ORAL | Status: DC

## 2018-05-28 MED ORDER — HYDROXYZINE HCL 25 MG PO TABS
12.5000 mg | ORAL_TABLET | Freq: Two times a day (BID) | ORAL | Status: DC
  Administered 2018-05-29 – 2018-05-30 (×3): 12.5 mg via ORAL
  Filled 2018-05-28 (×4): qty 1

## 2018-05-28 MED ORDER — ENOXAPARIN SODIUM 40 MG/0.4ML ~~LOC~~ SOLN
40.0000 mg | SUBCUTANEOUS | Status: DC
  Administered 2018-05-28 – 2018-05-30 (×3): 40 mg via SUBCUTANEOUS
  Filled 2018-05-28 (×4): qty 0.4

## 2018-05-28 MED ORDER — SODIUM CHLORIDE 0.9 % IV SOLN
1.0000 g | INTRAVENOUS | Status: DC
  Administered 2018-05-29 – 2018-05-30 (×2): 1 g via INTRAVENOUS
  Filled 2018-05-28 (×2): qty 10

## 2018-05-28 MED ORDER — SODIUM CHLORIDE 0.9 % IV SOLN: 1.0000 g | INTRAVENOUS | Status: DC

## 2018-05-28 MED ORDER — PREDNISOLONE ACETATE 1 % OP SUSP
2.0000 [drp] | Freq: Two times a day (BID) | OPHTHALMIC | Status: DC | PRN
  Filled 2018-05-28: qty 5

## 2018-05-28 MED ORDER — DOCUSATE SODIUM 100 MG PO CAPS
100.0000 mg | ORAL_CAPSULE | Freq: Two times a day (BID) | ORAL | Status: DC
  Administered 2018-05-29 – 2018-05-30 (×3): 100 mg via ORAL
  Filled 2018-05-28 (×4): qty 1

## 2018-05-28 MED ORDER — CIPROFLOXACIN 500 MG/5ML (10%) PO SUSR: 500.0000 mg | Freq: Two times a day (BID) | ORAL | Status: DC

## 2018-05-28 MED ORDER — COLLAGENASE 250 UNIT/GM EX OINT
TOPICAL_OINTMENT | Freq: Every day | CUTANEOUS | Status: DC
  Administered 2018-05-28 – 2018-05-29 (×2): via TOPICAL
  Filled 2018-05-28: qty 30

## 2018-05-28 MED ORDER — ACETAMINOPHEN 500 MG PO TABS: 500.0000 mg | ORAL_TABLET | Freq: Four times a day (QID) | ORAL | Status: DC | PRN

## 2018-05-28 MED ORDER — SODIUM CHLORIDE 0.9 % IV SOLN
1.0000 g | Freq: Once | INTRAVENOUS | Status: AC
  Administered 2018-05-28: 1 g via INTRAVENOUS
  Filled 2018-05-28: qty 10

## 2018-05-28 MED ORDER — POTASSIUM CHLORIDE 10 MEQ/100ML IV SOLN
10.0000 meq | INTRAVENOUS | Status: AC
  Filled 2018-05-28: qty 100

## 2018-05-28 MED ORDER — BARRIER CREAM NON-SPECIFIED
1.0000 "application " | TOPICAL_CREAM | Freq: Three times a day (TID) | TOPICAL | Status: DC | PRN
  Filled 2018-05-28: qty 1

## 2018-05-28 MED ORDER — QUETIAPINE FUMARATE 25 MG PO TABS
25.0000 mg | ORAL_TABLET | Freq: Two times a day (BID) | ORAL | Status: DC | PRN
  Filled 2018-05-28: qty 1

## 2018-05-28 NOTE — ED Provider Notes (Signed)
Physical Exam  BP 125/66   Pulse 86   Temp 97.7 F (36.5 C) (Oral)   Resp 14   Ht 5\' 6"  (1.676 m)   Wt 73 kg   SpO2 100%   BMI 25.98 kg/m   Assumed care from [employer], PA-C at [phone]. Briefly, the patient is a 84 y.o. male with PMHx of  has a past medical history of A-fib ([place]), AKI (acute kidney injury) ([place]), Arthritis, Benign hypertension with chronic kidney disease, stage III ([place]) (11/28/2015), Bladder cancer ([place]), CAD (coronary artery disease) (05/31/2016), Cataract, Chronic kidney disease, CKD (chronic kidney disease) stage 3, GFR 30-59 ml/min ([place]), CKD (chronic kidney disease), stage III ([place]) (11/28/2015), Dyslipidemia, First degree AV block (06/28/2013), Gait abnormality (05/26/2017), Glaucoma, Hard of hearing, Hyperlipidemia (11/28/2015), Hypertension, Malignant neoplasm of prostate ([place]), Malignant neoplasm of urinary bladder ([place]) (07/01/2012), Memory difficulty (05/26/2017), NSVT (nonsustained ventricular tachycardia) ([place]) (11/28/2015), Paroxysmal ventricular tachycardia ([place]) (05/31/2016), Prostate cancer ([place]), PVC (premature ventricular contraction), Sleep apnea, Ventricular bigeminy (05/31/2016), and Ventricular tachycardia, nonsustained/bigeminy. here with fall.  Patient found on floor in his room in SNF. Patient unable to provide much history about the fall.  Patient does not take blood thinning medications, although has a history of atrial fibrillation.  At this point, awaiting CT head, neck, and x-rays of affected extremities.  11:25 AM Collateral information obtained from patient's wife, Quantavius Humm.  Per patient's wife, she last saw patient 48 hours ago, and noted that he had to "seizure-like" episodes where he had a rigid muscular motions while he was trying to get from the bed to the wheelchair.  She reports that he sleeps most of the day, and is responsive to voice, oriented to person.  She also notes that patient had recent discontinuation of his medications except for  furosemide.  She reports that he started medication from the [facility] living community for urinary tract infection a couple days ago, as he had developed UTI again after discharge from hospital on 04/25/18.  Labs Reviewed  I-STAT TROPONIN, ED - Abnormal; Notable for the following components:      Result Value   Troponin i, poc 0.11 (*)    All other components within normal limits  CBC WITH DIFFERENTIAL/PLATELET  BASIC METABOLIC PANEL    Course of Care:   Physical Exam  Constitutional: He appears well-developed and well-nourished. No distress.  HENT:  Head: Normocephalic and atraumatic.  Mouth/Throat: Oropharynx is clear and moist.  Eyes: Pupils are equal, round, and reactive to light. Conjunctivae and EOM are normal.  Neck: Normal range of motion. Neck supple.  Cardiovascular: Normal rate, regular rhythm, S1 normal and S2 normal.  Pulmonary/Chest: Effort normal and breath sounds normal.  Abdominal: Soft. He exhibits no distension. There is no tenderness. There is no guarding.  Musculoskeletal: Normal range of motion. He exhibits no edema or deformity.  Neurological: GCS eye subscore is 3. GCS verbal subscore is 4. GCS motor subscore is 6.  Neurologic exam limited by patient's ability to participate.  Patient is able to produce equal, 4 out of 5 grip strength bilaterally.  Patient localizes to pain in all extremities.  Skin: Skin is warm and dry. No rash noted. There is erythema.  See clinical photos for details.  Patient has a stage III pressure ulcer of the sacrum.  No tunneling.  Psychiatric: He has a normal mood and affect. His behavior is normal. Judgment and thought content normal.  Nursing note and vitals reviewed.     ED Course/Procedures  Clinical Course as of May 28 1314  Thu May 28, 2018  3704 Attending physician received call from lab regarding patient's elevated troponin to 0.11.  Will obtain delta troponin.  If negative, patient can still return to home.   Possibly false positive versus chronically elevated.   [AM]  1218 Noted. Patient receiving potassium repletion.  Potassium(!!): 2.5 [AM]  1300 Consistent and not elevating.  Troponin i, poc(!!): 0.13 [AM]  1301 Stress demargination versus acute infection.  WBC(!): 14.4 [AM]  1301 Elevating. Initial lactic 1.41.   Lactic Acid, Venous(!): 1.91 [AM]    Clinical Course User Index [AM] Albesa Seen, PA-C    Procedures  MDM    Patient with fall, seemingly altered mental status per family, and nonseptic at this time.  Patient does exhibit leukocytosis to 14.4, and has hypokalemia of 2.5, presumed from patient's furosemide.  Patient also exhibits elevated troponin, which appears stable and multiple delta collections.  Patient has a history of elevated troponin when he was previous admitted for urinary tract infection in August 2019.  Given altered mental status per patient's wife, fall, and elevated troponin with known urinary tract infection at his facility, will pursue admission for patient.   1:17 PM spoke with Dr.  Earnest Conroy of [facility] who will admit the patient.  I appreciate her involvement in the care of this patient.  We discussed entering DNR order, as I spoke with patient's spouse.   Albesa Seen, PA-C 05/28/18 1318    Orpah Greek, MD 05/28/18 2310

## 2018-05-28 NOTE — ED Triage Notes (Signed)
Per EMS, pt coming from [facility] home. Pt was found lying on the floor by staff, pt was seen well and hour prior per staff. Pt is complaining of L knee pain, R hip pain and L sided head pain. Pt has hx of alzheimers but is A&Ox4 upon arrival. Pt is not on thinners. VSS.

## 2018-05-28 NOTE — ED Notes (Signed)
Report taken from off-going RN - care assumed at this time - resting quietly on stretcher with eyes closed; will pull from tactile stimulation - interm moans - nonverbal - night shift RN reports this is pt's norm

## 2018-05-28 NOTE — ED Provider Notes (Signed)
[facility] EMERGENCY DEPARTMENT Provider Note   CSN: [id number] Arrival date & time: 05/28/18  0257     History   Chief Complaint No chief complaint on file.   HPI [name] is a 84 y.o. male.  Patient with history of Alzheimer's disease presents to the emergency department from [place] with a chief complaint of fall.  He found lying on the floor by staff.  Presumed to have fallen.  This was unwitnessed.  Patient complains of hip pain and knee pain.  He denies any other pain.  There are no aggravating factors.  There are no other associated symptoms.  Specifically, he denies any chest pain or shortness of breath.  Patient is not anticoagulated.  The history is provided by the EMS personnel, the patient and medical records. No language interpreter was used.    Past Medical History:  Diagnosis Date  . A-fib ([name])   . AKI (acute kidney injury) ([place])   . Arthritis   . Benign hypertension with chronic kidney disease, stage III ([place]) 11/28/2015  . Bladder cancer ([place])   . CAD (coronary artery disease) 05/31/2016   cath 02/28/11: dLM 10%, pLAD 20%, pRCA calcified nodule 50% (FFR 0.86 - not significant), mRCA 40-50%, mildly elevated filling pressures, EF 50-55%  . Cataract   . Chronic kidney disease   . CKD (chronic kidney disease) stage 3, GFR 30-59 ml/min ([facility])   . CKD (chronic kidney disease), stage III ([place]) 11/28/2015   follows up with Dr. Clydell Hakim Last creatinine 1.36, EGFR 50  . Dyslipidemia   . First degree AV block 06/28/2013  . Gait abnormality 05/26/2017  . Glaucoma   . Hard of hearing   . Hyperlipidemia 11/28/2015   Last Assessment & Plan: Lipids were at goal a year ago; continue statin  . Hypertension    LOV with EKG 06/11/11 Dr Caryl Comes  [facility]  . Malignant neoplasm of prostate [facility])    surgery and radiation  . Malignant neoplasm of urinary bladder ([place]) 07/01/2012   surgery and chemo  . Memory difficulty 05/26/2017  . NSVT  (nonsustained ventricular tachycardia) ([place]) 11/28/2015   Overview: Overview: Overview: echo 6/12: EF 55%, basal inferolat AK, mild LVH Last Assessment & Plan: resolved Overview: echo 6/12: EF 55%, basal inferolat AK, mild LVH Last Assessment & Plan: resolved  . Paroxysmal ventricular tachycardia ([place]) 05/31/2016   Overview: Overview: echo 6/12: EF 55%, basal inferolat AK, mild LVH Last Assessment & Plan: resolved  . Prostate cancer ([place])    status post brachytherapy---in 2003  . PVC (premature ventricular contraction)   . Sleep apnea    STOP BANG SCORE 4  . Ventricular bigeminy 05/31/2016   Overview: Overview: rhythmol Date PR QRS 6/12 23 088 10/13 24 110 10/14 31 109 Last Assessment & Plan: The patient is without symptomatic oral echocardiographic ectopy. His exercise tolerance is quite good. I am concerned about the interval prolongation of his PR interval. We will follow this closely. It is potentially related to the presence of his 1C antiarrhythmic and we may need to discontinue  . Ventricular tachycardia, nonsustained/bigeminy    echo 6/12: EF 55%, basal inferolat AK, mild LVH    Patient Active Problem List   Diagnosis Date Noted  . Pressure injury of skin 04/23/2018  . Chronic respiratory failure with hypoxia ([place]) 04/23/2018  . Sacral pressure ulcer 04/23/2018  . UTI (urinary tract infection), bacterial/CAUTI/Pseudomonas 04/22/2018  . Metabolic encephalopathy 44/09/270  . UTI (urinary tract infection)  04/22/2018  . E. coli UTI (urinary tract infection) 03/10/2018  . Acute metabolic encephalopathy 19/50/9326  . Acute kidney injury ([place]) 03/10/2018  . Chronic systolic (congestive) heart failure ([place]) 03/10/2018  . Pneumonia 02/24/2018  . Pleural effusion on right 12/05/2017  . Acute respiratory failure with hypoxia ([place]) 12/05/2017  . Memory difficulty 05/26/2017  . Gait abnormality 05/26/2017  . Constipation 01/03/2017  . Atrial fibrillation ([place]) 01/03/2017  . Hyponatremia  01/03/2017  . First degree AV block 06/28/2013  . Bladder cancer ([place]) 07/01/2012  . Cataracts, bilateral 03/14/2011  . Ventricular bigeminy   . Nonsustained ventricular tachycardia ([place])   . Nonobstructive CAD (coronary artery disease)   . Hypertension   . Hyperlipidemia     Past Surgical History:  Procedure Laterality Date  . APPENDECTOMY  1951  . BLADDER SURGERY  2013   followed by chemo  . CARDIAC CATHETERIZATION  6/12  . CATARACT EXTRACTION W/ INTRAOCULAR LENS IMPLANT Right 2016   Right eye [facility] Dr. Lennox Pippins  . CATARACT EXTRACTION W/ INTRAOCULAR LENS IMPLANT Left 2014  . CORONARY ANGIOPLASTY     no significant blockage requiring stenting per patient  . CYSTOSCOPY  05/20/2012   Procedure: CYSTOSCOPY;  Surgeon: Alexis Frock, MD;  Location: [facility];  Service: Urology;  Laterality: N/A;  . CYSTOSCOPY  07/01/2012   Procedure: CYSTOSCOPY;  Surgeon: Molli Hazard, MD;  Location: [facility];  Service: Urology;  Laterality: N/A;  . DESCEMETS STRIPPING AUTOMATED ENDOTHELIAL KERATOPLASTY Right 06/13/2016   Procedure: DESCEMETS STRIPPING ENDOTHELIAL KERATOPLASTY; Surgeon: Marilynne Halsted, MD; Location: [facility] MAIN [facility]; Service: Ophthalmology; Laterality: Right;  . DESCEMETS STRIPPING AUTOMATED ENDOTHELIAL KERATOPLASTY Right 11/14/2016   Procedure: DESCEMETS STRIPPING ENDOTHELIAL KERATOPLASTY; Surgeon: Marilynne Halsted, MD; Location: [facility]IN OR; Service: Ophthalmology; Laterality: Right;  . EYE SURGERY     left cataract extraction with IOL  . PROSTATE SURGERY  2003   followed by radiation  . SKIN BIOPSY     BCC excised   . SKIN CANCER EXCISION     x 4  . TRANSURETHRAL RESECTION OF BLADDER TUMOR  05/20/2012   Procedure: TRANSURETHRAL RESECTION OF BLADDER TUMOR (TURBT);  Surgeon: Alexis Frock, MD;  Location: [facility];  Service: Urology;  Laterality: N/A;  [place]   . TRANSURETHRAL RESECTION OF BLADDER TUMOR  07/01/2012   Procedure: TRANSURETHRAL RESECTION OF BLADDER TUMOR  (TURBT);  Surgeon: Molli Hazard, MD;  Location: [facility];  Service: Urology;  Laterality: N/A;  Cystoscopy, TURBT, be prepared for Open Cystorraphy            Home Medications    Prior to Admission medications   Medication Sig Start Date End Date Taking? Authorizing Provider  acetaminophen (TYLENOL) 500 MG tablet Take 500 mg by mouth every 6 (six) hours as needed for mild pain.    [provider]  aspirin EC 81 MG tablet Take 81 mg by mouth daily.     [provider]  atorvastatin (LIPITOR) 20 MG tablet Take 20 mg by mouth every evening.     [provider]  barrier cream (NON-SPECIFIED) CREA Apply 1 application topically 3 (three) times daily as needed (sacrum and groin redness).    [provider]  collagenase (SANTYL) ointment Apply topically daily. Apply Santyl to upper and lower sacrum wounds Q day, then cover with moist gauze dressing and foam dressing.  (Change foam dressing Q 3 days or PRN soiling.) 04/25/18   Samuella Cota, MD  furosemide (LASIX) 20 MG tablet Take 1  tablet (20 mg total) by mouth daily. 02/28/18   Doreatha Lew, MD  hydrOXYzine (ATARAX/VISTARIL) 25 MG tablet Take 25 mg by mouth 3 (three) times daily.    [provider]  OXYGEN Inhale 2 L into the lungs continuous.    [provider]  prednisoLONE acetate (PRED FORTE) 1 % ophthalmic suspension Place 2 drops into both eyes 2 (two) times daily as needed (allergies).     [provider]    Family History Family History  Problem Relation Age of Onset  . Hypertension Mother   . Arthritis Son   . Heart Problems Son        Aortic valve replacement  . Lung cancer Daughter   . Heart attack Neg Hx   . Stroke Neg Hx     Social History Social History   Tobacco Use  . Smoking status: Former Smoker    Types: Cigars    Last attempt to quit: 05/20/1995    Years since quitting: 23.0  . Smokeless tobacco: Current User    Types: Chew    Substance Use Topics  . Alcohol use: Not Currently  . Drug use: No     Allergies   Neosporin [neomycin-bacitracin zn-polymyx]; Penicillins; and Penicillin g   Review of Systems Review of Systems  All other systems reviewed and are negative.    Physical Exam Updated Vital Signs There were no vitals taken for this visit.  Physical Exam  Constitutional: He is oriented to person, place, and time. He appears well-developed and well-nourished.  HENT:  Head: Normocephalic and atraumatic.  Eyes: Pupils are equal, round, and reactive to light. Conjunctivae and EOM are normal. Right eye exhibits no discharge. Left eye exhibits no discharge. No scleral icterus.  Neck: Normal range of motion. Neck supple. No JVD present.  Cardiovascular: Normal rate, regular rhythm and normal heart sounds. Exam reveals no gallop and no friction rub.  No murmur heard. Pulmonary/Chest: Effort normal and breath sounds normal. No respiratory distress. He has no wheezes. He has no rales. He exhibits no tenderness.  Abdominal: Soft. He exhibits no distension and no mass. There is no tenderness. There is no rebound and no guarding.  Musculoskeletal: Normal range of motion. He exhibits no edema or tenderness.  Neurological: He is alert and oriented to person, place, and time.  Skin: Skin is warm and dry.  Psychiatric: He has a normal mood and affect. His behavior is normal. Judgment and thought content normal.  Nursing note and vitals reviewed.    ED Treatments / Results  Labs (all labs ordered are listed, but only abnormal results are displayed) Labs Reviewed - No data to display  EKG None  Radiology No results found.  Procedures Procedures (including critical care time)  Medications Ordered in ED Medications - No data to display   Initial Impression / Assessment and Plan / ED Course  I have reviewed the triage vital signs and the nursing notes.  Pertinent labs & imaging results that were  available during my care of the patient were reviewed by me and considered in my medical decision making (see chart for details).    Patient with presumed mechanical fall at nursing home.  Will check imaging and labs.  Will reassess.  Patient signed out to [place], [place], who will follow-up on results and dispo accordingly.   Final Clinical Impressions(s) / ED Diagnoses   Final diagnoses:  None    ED Discharge Orders    None  Montine Circle, PA-C 05/28/18 5483    Orpah Greek, MD 05/28/18 [phone]

## 2018-05-28 NOTE — ED Notes (Signed)
EKG done

## 2018-05-28 NOTE — ED Notes (Signed)
Report called to 5W RN - ready to accept pt

## 2018-05-28 NOTE — H&P (Addendum)
History and Physical    DOA: 05/28/2018  PCP: Gregor Hams, FNP  Patient coming from: [name] facility  Chief Complaint: Altered mental status and fall this morning at 1 AM  HPI: [name] is a 84 y.o. male with history h/o hypertension, hyperlipidemia, chronic kidney disease, dementia, prostrate and bladder cancer status post surgery and now on chronic indwelling Foley catheter, CAD, chronic systolic CHF with last EF 35 to 40%, NSVT, recurrent falls and recurrent urinary tract infections sent from assisted living facility today after a fall. Patient currently unable to provide much of history and somnolent.  Most of the history obtained by talking to wife and primary nurse (JJ at [phone]) at the nursing facility.  Patient at baseline has dementia, poor oral intake (eats about 2 meals a day) and is mostly wheelchair-bound.  Apparently son who was visiting him 2 days back noticed that he was hallucinating (talking to people who weren't there) and reported to nursing staff .  Currently UA was sent which was abnormal and patient was started on ciprofloxacin (he received 1 dose).  Last night he was hallucinating again and trying to get out of bed.  He was reoriented but no report of having a sitter/being medicated.  Around 1 AM he was found on the floor after suffering a fall and reported that he hit his head.  He also was noted to have skin abrasions on right knee and elbow.  He was subsequently sent to ED here where work-up revealed abnormal UA with pyuria, leukocytosis with WBC of 16 K, elevated creatinine, low potassium, mildly abnormal troponin similar to prior presentations, negative radiological work-up including CT head, CT spine, chest x-ray, knee x-ray.  He received IV fluids, IV potassium and Rocephin has just been started as previous urine cultures showed resistance to Cipro but sensitivity to cephalosporins.  Hospitalist service requested to admit patient for  further evaluation and management.  Patient noted to be on 2 L nasal cannula and in deep sleep with mild breathing.  He is easily arousable and mental status appears to be improving currently.  He denied any pain but stated he was tired and wanted to sleep.  When asked about eating, he stated he was not hungry.   Review of Systems: As per HPI otherwise 10 point review of systems negative.    Past Medical History:  Diagnosis Date  . A-fib ([place])   . AKI (acute kidney injury) ([place])   . Arthritis   . Benign hypertension with chronic kidney disease, stage III ([place]) 11/28/2015  . Bladder cancer ([place])   . CAD (coronary artery disease) 05/31/2016   cath 02/28/11: dLM 10%, pLAD 20%, pRCA calcified nodule 50% (FFR 0.86 - not significant), mRCA 40-50%, mildly elevated filling pressures, EF 50-55%  . Cataract   . Chronic kidney disease   . CKD (chronic kidney disease) stage 3, GFR 30-59 ml/min (HCC)   . CKD (chronic kidney disease), stage III ([place]) 11/28/2015   follows up with Dr. Clydell Hakim Last creatinine 1.36, EGFR 50  . Dyslipidemia   . First degree AV block 06/28/2013  . Gait abnormality 05/26/2017  . Glaucoma   . Hard of hearing   . Hyperlipidemia 11/28/2015   Last Assessment & Plan: Lipids were at goal a year ago; continue statin  . Hypertension    LOV with EKG 06/11/11 Dr Caryl Comes  [facility]  . Malignant neoplasm of prostate [facility]    surgery and radiation  . Malignant neoplasm  of urinary bladder ([place]) 07/01/2012   surgery and chemo  . Memory difficulty 05/26/2017  . NSVT (nonsustained ventricular tachycardia) ([place]) 11/28/2015   Overview: Overview: Overview: echo 6/12: EF 55%, basal inferolat AK, mild LVH Last Assessment & Plan: resolved Overview: echo 6/12: EF 55%, basal inferolat AK, mild LVH Last Assessment & Plan: resolved  . Paroxysmal ventricular tachycardia ([place]) 05/31/2016   Overview: Overview: echo 6/12: EF 55%, basal inferolat AK, mild LVH Last Assessment & Plan: resolved  . Prostate  cancer ([place])    status post brachytherapy---in 2003  . PVC (premature ventricular contraction)   . Sleep apnea    STOP BANG SCORE 4  . Ventricular bigeminy 05/31/2016   Overview: Overview: rhythmol Date PR QRS 6/12 23 088 10/13 24 110 10/14 31 109 Last Assessment & Plan: The patient is without symptomatic oral echocardiographic ectopy. His exercise tolerance is quite good. I am concerned about the interval prolongation of his PR interval. We will follow this closely. It is potentially related to the presence of his 1C antiarrhythmic and we may need to discontinue  . Ventricular tachycardia, nonsustained/bigeminy    echo 6/12: EF 55%, basal inferolat AK, mild LVH    Past Surgical History:  Procedure Laterality Date  . APPENDECTOMY  1951  . BLADDER SURGERY  2013   followed by chemo  . CARDIAC CATHETERIZATION  6/12  . CATARACT EXTRACTION W/ INTRAOCULAR LENS IMPLANT Right 2016   Right eye [facility] Dr. Lennox Pippins  . CATARACT EXTRACTION W/ INTRAOCULAR LENS IMPLANT Left 2014  . CORONARY ANGIOPLASTY     no significant blockage requiring stenting per patient  . CYSTOSCOPY  05/20/2012   Procedure: CYSTOSCOPY;  Surgeon: Alexis Frock, MD;  Location: [facility];  Service: Urology;  Laterality: N/A;  . CYSTOSCOPY  07/01/2012   Procedure: CYSTOSCOPY;  Surgeon: Molli Hazard, MD;  Location: [facility];  Service: Urology;  Laterality: N/A;  . DESCEMETS STRIPPING AUTOMATED ENDOTHELIAL KERATOPLASTY Right 06/13/2016   Procedure: DESCEMETS STRIPPING ENDOTHELIAL KERATOPLASTY; Surgeon: Marilynne Halsted, MD; Location: [facility] MAIN [facility]; Service: Ophthalmology; Laterality: Right;  . DESCEMETS STRIPPING AUTOMATED ENDOTHELIAL KERATOPLASTY Right 11/14/2016   Procedure: DESCEMETS STRIPPING ENDOTHELIAL KERATOPLASTY; Surgeon: Marilynne Halsted, MD; Location: [facility]IN [facility]; Service: Ophthalmology; Laterality: Right;  . EYE SURGERY     left cataract extraction with IOL  . PROSTATE SURGERY  2003   followed by radiation    . SKIN BIOPSY     BCC excised   . SKIN CANCER EXCISION     x 4  . TRANSURETHRAL RESECTION OF BLADDER TUMOR  05/20/2012   Procedure: TRANSURETHRAL RESECTION OF BLADDER TUMOR (TURBT);  Surgeon: Alexis Frock, MD;  Location: [facility];  Service: Urology;  Laterality: N/A;  [place]   . TRANSURETHRAL RESECTION OF BLADDER TUMOR  07/01/2012   Procedure: TRANSURETHRAL RESECTION OF BLADDER TUMOR (TURBT);  Surgeon: Molli Hazard, MD;  Location: [facility];  Service: Urology;  Laterality: N/A;  Cystoscopy, TURBT, be prepared for Open Cystorraphy        Social history:  reports that he quit smoking about 23 years ago. His smoking use included cigars. His smokeless tobacco use includes chew. He reports that he drank alcohol. He reports that he does not use drugs.   Allergies  Allergen Reactions  . Neosporin [Neomycin-Bacitracin Zn-Polymyx] Other (See Comments)    Makes his skin red   . Penicillins Hives and Itching    Has patient had a PCN reaction causing immediate rash, facial/tongue/throat swelling, SOB or lightheadedness with hypotension:  Yes Has patient had a PCN reaction causing severe rash involving mucus membranes or skin necrosis: No Has patient had a PCN reaction that required hospitalization: Unknown Has patient had a PCN reaction occurring within the last 10 years: Unknown If all of the above answers are "NO", then may proceed with Cephalosporin use.   Marland Kitchen Penicillin G Rash    Family History  Problem Relation Age of Onset  . Hypertension Mother   . Arthritis Son   . Heart Problems Son        Aortic valve replacement  . Lung cancer Daughter   . Heart attack Neg Hx   . Stroke Neg Hx       Prior to Admission medications   Medication Sig Start Date End Date Taking? Authorizing Provider  barrier cream (NON-SPECIFIED) CREA Apply 1 application topically 3 (three) times daily as needed (sacrum and groin redness).   Yes [provider]  ciprofloxacin (CIPRO) 500 MG/5ML  (10%) suspension Take 500 mg by mouth 2 (two) times daily. For 5 days 05/27/18 06/01/18 Yes [provider]  furosemide (LASIX) 20 MG tablet Take 1 tablet (20 mg total) by mouth daily. 02/28/18  Yes Patrecia Pour, Christean Grief, MD  hydrOXYzine (ATARAX/VISTARIL) 25 MG tablet Take 25 mg by mouth 3 (three) times daily.   Yes [provider]  OXYGEN Inhale 2 L into the lungs continuous.   Yes [provider]  prednisoLONE acetate (PRED FORTE) 1 % ophthalmic suspension Place 2 drops into both eyes 2 (two) times daily as needed (allergies).    Yes [provider]  acetaminophen (TYLENOL) 500 MG tablet Take 500 mg by mouth every 6 (six) hours as needed for mild pain.    [provider]  collagenase (SANTYL) ointment Apply topically daily. Apply Santyl to upper and lower sacrum wounds Q day, then cover with moist gauze dressing and foam dressing.  (Change foam dressing Q 3 days or PRN soiling.) Patient not taking: Reported on 05/28/2018 04/25/18   Samuella Cota, MD    Physical Exam: Vitals:   05/28/18 1130 05/28/18 1145 05/28/18 1200 05/28/18 1204  BP: (!) 116/58 (!) 117/98 (!) 134/107   Pulse: 81 78 82   Resp: 19 12 (!) 24   Temp:   (!) 97.3 F (36.3 C) (!) 97.3 F (36.3 C)  TempSrc:   Rectal Rectal  SpO2: 100% 100% 100%   Weight:      Height:        Constitutional: NAD, calm, comfortable Vitals:   05/28/18 1130 05/28/18 1145 05/28/18 1200 05/28/18 1204  BP: (!) 116/58 (!) 117/98 (!) 134/107   Pulse: 81 78 82   Resp: 19 12 (!) 24   Temp:   (!) 97.3 F (36.3 C) (!) 97.3 F (36.3 C)  TempSrc:   Rectal Rectal  SpO2: 100% 100% 100%   Weight:      Height:      General: Resting comfortably on 2 L nasal cannula, appears to be sleeping and mouth breathing Eyes: PERRL, lids and conjunctivae normal ENMT: Mucous membranes are dry. Posterior pharynx clear of any exudate or lesions. Neck: normal, supple, no masses, no thyromegaly Respiratory: clear to  auscultation bilaterally, no wheezing, no crackles. Normal respiratory effort. No accessory muscle use.  Cardiovascular: Regular rate and rhythm, no murmurs / rubs / gallops. No extremity edema. 2+ pedal pulses. No carotid bruits.  Abdomen: no tenderness, no masses palpated. No hepatosplenomegaly. Bowel sounds positive.  Musculoskeletal: no clubbing / cyanosis.  No joint deformity upper and lower extremities. Good ROM, no contractures. Normal muscle tone.  Neurologic: Moving all extremities, follows commands.  Disoriented to time and place. SKIN/catheters: Sacral decubitus, indwelling Foley catheter   s   Labs on Admission: I have personally reviewed following labs and imaging studies  CBC: Recent Labs  Lab 05/28/18 0300 05/28/18 1051  WBC 16.2* 14.4*  NEUTROABS  --  9.5*  HGB 13.8 13.2  HCT 42.8 40.6  MCV 89.0 89.4  PLT 318 629   Basic Metabolic Panel: Recent Labs  Lab 05/28/18 0300 05/28/18 1051  NA 137 137  K 2.7* 2.5*  CL 88* 90*  CO2 29 36*  GLUCOSE 110* 118*  BUN 7* 7*  CREATININE 1.23 1.19  CALCIUM 9.1 8.8*   GFR: Estimated Creatinine Clearance: 38 mL/min (by C-G formula based on SCr of 1.19 mg/dL). Liver Function Tests: No results for input(s): AST, ALT, ALKPHOS, BILITOT, PROT, ALBUMIN in the last 168 hours. No results for input(s): LIPASE, AMYLASE in the last 168 hours. No results for input(s): AMMONIA in the last 168 hours. Coagulation Profile: No results for input(s): INR, PROTIME in the last 168 hours. Cardiac Enzymes: Recent Labs  Lab 05/28/18 0300  TROPONINI 0.09*   BNP (last 3 results) No results for input(s): PROBNP in the last 8760 hours. HbA1C: No results for input(s): HGBA1C in the last 72 hours. CBG: No results for input(s): GLUCAP in the last 168 hours. Lipid Profile: No results for input(s): CHOL, HDL, LDLCALC, TRIG, CHOLHDL, LDLDIRECT in the last 72 hours. Thyroid Function Tests: No results for input(s): TSH, T4TOTAL, FREET4, T3FREE,  THYROIDAB in the last 72 hours. Anemia Panel: No results for input(s): VITAMINB12, FOLATE, FERRITIN, TIBC, IRON, RETICCTPCT in the last 72 hours. Urine analysis:    Component Value Date/Time   COLORURINE YELLOW 05/28/2018 1033   APPEARANCEUR TURBID (A) 05/28/2018 1033   LABSPEC 1.020 05/28/2018 1033   PHURINE 7.5 05/28/2018 1033   GLUCOSEU NEGATIVE 05/28/2018 1033   HGBUR SMALL (A) 05/28/2018 1033   BILIRUBINUR SMALL (A) 05/28/2018 1033   KETONESUR NEGATIVE 05/28/2018 1033   PROTEINUR 100 (A) 05/28/2018 1033   NITRITE NEGATIVE 05/28/2018 1033   LEUKOCYTESUR LARGE (A) 05/28/2018 1033    Radiological Exams on Admission: Dg Chest 2 View  Result Date: 05/28/2018 CLINICAL DATA:  History of recent fall, patient from nursing home, non communicative EXAM: CHEST - 2 VIEW COMPARISON:  Portable chest x-ray of 02/24/2018 FINDINGS: No active infiltrate or effusion is seen. Mediastinal and hilar contours are unremarkable. There is apparent persistent elevation of the right hemidiaphragm. Mild cardiomegaly is stable. There are degenerative changes throughout the thoracic spine. IMPRESSION: No active lung disease. Stable mild cardiomegaly and apparent chronic elevation of the right hemidiaphragm. Electronically Signed   By: Ivar Drape M.D.   On: 05/28/2018 11:16   Ct Head Wo Contrast  Result Date: 05/28/2018 CLINICAL DATA:  Found on floor.  Headache. EXAM: CT HEAD WITHOUT CONTRAST CT CERVICAL SPINE WITHOUT CONTRAST TECHNIQUE: Multidetector CT imaging of the head and cervical spine was performed following the standard protocol without intravenous contrast. Multiplanar CT image reconstructions of the cervical spine were also generated. COMPARISON:  04/22/2018 FINDINGS: CT HEAD FINDINGS Brain: There is atrophy and chronic small vessel disease changes. No acute intracranial abnormality. Specifically, no hemorrhage, hydrocephalus, mass lesion, acute infarction, or significant intracranial injury. Vascular: No  hyperdense vessel or unexpected calcification. Skull: No acute calvarial abnormality. Sinuses/Orbits: Opacified frontal sinuses, ethmoid air cells and right maxillary sinus.  Mucosal thickening in the left maxillary sinus. Opacified left mastoid air cells. These findings are stable since prior study. Other: None CT CERVICAL SPINE FINDINGS Alignment: No subluxation. Skull base and vertebrae: No acute fracture. No primary bone lesion or focal pathologic process. Soft tissues and spinal canal: No prevertebral fluid or swelling. No visible canal hematoma. Disc levels: Diffuse degenerative disc disease and facet disease, moderate to advanced. Upper chest: Diffuse aortic arch and great vessel calcifications. No acute findings. Other: Carotid artery calcifications.  No acute findings. IMPRESSION: No acute intracranial abnormality. Atrophy, chronic microvascular disease. Pain and chronic sinusitis. Opacified left mastoid air cells, stable. Diffuse degenerative disc and facet disease in the cervical spine. No acute bony abnormality. Electronically Signed   By: Rolm Baptise M.D.   On: 05/28/2018 10:29   Ct Cervical Spine Wo Contrast  Result Date: 05/28/2018 CLINICAL DATA:  Found on floor.  Headache. EXAM: CT HEAD WITHOUT CONTRAST CT CERVICAL SPINE WITHOUT CONTRAST TECHNIQUE: Multidetector CT imaging of the head and cervical spine was performed following the standard protocol without intravenous contrast. Multiplanar CT image reconstructions of the cervical spine were also generated. COMPARISON:  04/22/2018 FINDINGS: CT HEAD FINDINGS Brain: There is atrophy and chronic small vessel disease changes. No acute intracranial abnormality. Specifically, no hemorrhage, hydrocephalus, mass lesion, acute infarction, or significant intracranial injury. Vascular: No hyperdense vessel or unexpected calcification. Skull: No acute calvarial abnormality. Sinuses/Orbits: Opacified frontal sinuses, ethmoid air cells and right maxillary  sinus. Mucosal thickening in the left maxillary sinus. Opacified left mastoid air cells. These findings are stable since prior study. Other: None CT CERVICAL SPINE FINDINGS Alignment: No subluxation. Skull base and vertebrae: No acute fracture. No primary bone lesion or focal pathologic process. Soft tissues and spinal canal: No prevertebral fluid or swelling. No visible canal hematoma. Disc levels: Diffuse degenerative disc disease and facet disease, moderate to advanced. Upper chest: Diffuse aortic arch and great vessel calcifications. No acute findings. Other: Carotid artery calcifications.  No acute findings. IMPRESSION: No acute intracranial abnormality. Atrophy, chronic microvascular disease. Pain and chronic sinusitis. Opacified left mastoid air cells, stable. Diffuse degenerative disc and facet disease in the cervical spine. No acute bony abnormality. Electronically Signed   By: Rolm Baptise M.D.   On: 05/28/2018 10:29   Dg Knee Complete 4 Views Left  Result Date: 05/28/2018 CLINICAL DATA:  Fall, pain EXAM: LEFT KNEE - COMPLETE 4+ VIEW COMPARISON:  None. FINDINGS: No acute bony abnormality. Specifically, no fracture, subluxation, or dislocation. No joint effusion. Joint spaces maintained. Vascular calcifications noted. IMPRESSION: No acute bony abnormality. Electronically Signed   By: Rolm Baptise M.D.   On: 05/28/2018 07:00   Dg Hip Unilat W Or Wo Pelvis 2-3 Views Right  Result Date: 05/28/2018 CLINICAL DATA:  Fall EXAM: DG HIP (WITH OR WITHOUT PELVIS) 2-3V RIGHT COMPARISON:  None. FINDINGS: Degenerative changes in the hips bilaterally with joint space narrowing and spurring. SI joints are symmetric and unremarkable. Radiation seeds in the pelvis. No acute bony abnormality. Specifically, no fracture, subluxation, or dislocation. IMPRESSION: No acute bony abnormality. Mild degenerative changes in the hips bilaterally. Electronically Signed   By: Rolm Baptise M.D.   On: 05/28/2018 07:01    EKG:  Independently reviewed.  A. fib with no new ST-T changes     Assessment and Plan:   1.  Metabolic encephalopathy/delirium with UTI: Urine cultures sent from ED.  UA shows pyuria in the setting of chronic indwelling Foley catheter.  Given leukocytosis and prior urine culture  results, agree with IV Rocephin.  Afebrile here and at assisted living facility.  Last date of Foley catheter change unclear, ED physician plans to change today.  Will have Seroquel available for hallucinations/as needed use.  Will keep n.p.o. with IV hydration until mental status more alert and able to take orally.  2.  Mild AKI: Patient's creatinine elevated at 1.2 on admission with baseline at 0.8.  Hold Lasix and hydrate.    3.  Severe hypokalemia: Likely secondary to Lasix.  Replace IV and if able ,orally.  4.  Chronic systolic CHF: Appears dry currently.  Hold Lasix in view of problem #2 in problem #3.  Not on beta-blockers at baseline  5.  Bladder cancer/prostate cancer: Status post surgery chemo and radiation.  On indwelling Foley catheter.  Given recurrent infections, SPT may need to be considered  6.  CAD: Previously on aspirin which was recently held, unclear reason.?  Secondary to falls.  Mildly elevated troponin noted but similar to prior labs.  Defer further work-up as he did have recent echo in March 2019.  Denies any chest pain.  EKG unremarkable.  Will however resume baby aspirin.  7. Sacral decubitus: Does not appear infected.  Resume prior treatment  8.  Fall: CT head/chest x-ray/knee/pelvic x-ray negative for any acute injuries.  Fall precautions ordered.  Will have Zyprexa available for agitation.  Avoid benzodiazepines or other sedatives in this patient with high risk for falls.  He is on hydroxyzine at baseline, will resume at lower dose.  DVT prophylaxis: Lovenox  Code Status: DO NOT RESUSCITATE , okay to intubate as confirmed by wife [name] at [phone]  Family Communication: Discussed  with wife and primary nurse at assisted living facility. Health care proxy would be wife Consults called: None Admission status:  Patient admitted as observation as anticipated LOS less than 2 midnights    Guilford Shi MD [facility] Pager [phone]  If 7PM-7AM, please contact night-coverage [email or web address] Password [email or web address]  05/28/2018, 1:35 PM

## 2018-05-28 NOTE — ED Notes (Signed)
ADDENDUM TO NS AND K+ infusion: currently infusing into PIV to [facility]

## 2018-05-28 NOTE — ED Notes (Signed)
Admitting team in to assess 

## 2018-05-28 NOTE — ED Notes (Signed)
Lactic acid and first BC drawn from [facility] x1 attempt via butterfly needle

## 2018-05-28 NOTE — Progress Notes (Signed)
RN paged Jeannette Corpus, NP to make her aware patient is not awake enough to take medications, awaiting response. P.J. Linus Mako, RN

## 2018-05-28 NOTE — ED Notes (Signed)
Second blood culture drawn from [facility] at time of IV insertion

## 2018-05-28 NOTE — ED Notes (Signed)
Indwelling foley drainage tubing/collection bag changed as existing one is extremely dirty; secured to left thigh with provided holder

## 2018-05-28 NOTE — Progress Notes (Signed)
Removed old foley and placed new Foley.  Looks like there is a tear in the meatus of penis where old Foley was resting.

## 2018-05-28 NOTE — ED Notes (Signed)
Sacral dressing removed; stage 2 decubitus noted to sacral area; new Mepilex applied

## 2018-05-28 NOTE — ED Notes (Signed)
CCM showing first degree AV block rate 78 with frequent couplets and multifocal PVCs

## 2018-05-28 NOTE — ED Notes (Signed)
Repeat troponin drawn per phlebotomist

## 2018-05-29 DIAGNOSIS — N3000 Acute cystitis without hematuria: Secondary | ICD-10-CM | POA: Diagnosis not present

## 2018-05-29 LAB — COMPREHENSIVE METABOLIC PANEL
ALT: 10 U/L (ref 0–44)
ANION GAP: 10 (ref 5–15)
AST: 18 U/L (ref 15–41)
Albumin: 2.5 g/dL — ABNORMAL LOW (ref 3.5–5.0)
Alkaline Phosphatase: 94 U/L (ref 38–126)
BUN: 6 mg/dL — ABNORMAL LOW (ref 8–23)
CO2: 32 mmol/L (ref 22–32)
Calcium: 8.4 mg/dL — ABNORMAL LOW (ref 8.9–10.3)
Chloride: 96 mmol/L — ABNORMAL LOW (ref 98–111)
Creatinine, Ser: 1.08 mg/dL (ref 0.61–1.24)
GFR, EST NON AFRICAN AMERICAN: 59 mL/min — AB (ref >60)
Glucose, Bld: 89 mg/dL (ref 70–99)
POTASSIUM: 2.9 mmol/L — AB (ref 3.5–5.1)
Sodium: 138 mmol/L (ref 135–145)
Total Bilirubin: 1 mg/dL (ref 0.3–1.2)
Total Protein: 5.4 g/dL — ABNORMAL LOW (ref 6.5–8.1)

## 2018-05-29 LAB — CBC
HEMATOCRIT: 39.6 % (ref 39.0–52.0)
Hemoglobin: 12.7 g/dL — ABNORMAL LOW (ref 13.0–17.0)
MCH: 28.9 pg (ref 26.0–34.0)
MCHC: 32.1 g/dL (ref 30.0–36.0)
MCV: 90 fL (ref 78.0–100.0)
Platelets: 303 10*3/uL (ref 150–400)
RBC: 4.4 MIL/uL (ref 4.22–5.81)
RDW: 12.4 % (ref 11.5–15.5)
WBC: 12.2 10*3/uL — ABNORMAL HIGH (ref 4.0–10.5)

## 2018-05-29 LAB — URINE CULTURE

## 2018-05-29 MED ORDER — COLLAGENASE 250 UNIT/GM EX OINT
TOPICAL_OINTMENT | Freq: Every day | CUTANEOUS | Status: DC
  Administered 2018-05-30: 10:00:00 via TOPICAL
  Filled 2018-05-29: qty 30

## 2018-05-29 MED ORDER — COLLAGENASE 250 UNIT/GM EX OINT: TOPICAL_OINTMENT | Freq: Every day | CUTANEOUS | Status: DC

## 2018-05-29 NOTE — Consult Note (Addendum)
[place] Nurse wound consult note Reason for Consult: Consult requested for sacrum wound. Pt is familiar to [place] from recent visit on 8/8. Wound type: Chronic unstageable sacrum pressure injury Pressure Injury POA: Yes Measurement: 3X1X.3cm Wound bed:15% red. 85% yellow slough Drainage (amount, consistency, odor) mod amt tan drainage, no odor Periwound: intact skin surrounding Dressing procedure/placement/frequency: Continue present plan of care with Santyl for enzymatic debridement of nonviable tissue.  Pt is on an air mattress to reduce pressure to the affected area.  Discussed plan of care with family member at the bedside. Please re-consult if further assistance is needed.  Thank-you,  Julien Girt MSN, [place], [place], [place], [place]

## 2018-05-29 NOTE — Clinical Social Work Note (Signed)
Clinical Social Work Assessment  Patient Details  Name: [name] MRN: [id number] Date of Birth: 1927-06-22  Date of referral:  05/29/18               Reason for consult:  Discharge Planning                Permission sought to share information with:  Facility Sport and exercise psychologist, Family Supports Permission granted to share information::  No  Name::     [name]  Agency::  [facility]  Relationship::  Spouse  Contact Information:  [phone]  Housing/Transportation Living arrangements for the past 2 months:  [place] of Information:  Spouse Patient Interpreter Needed:  None Criminal Activity/Legal Involvement Pertinent to Current Situation/Hospitalization:  No - Comment as needed Significant Relationships:  Adult Children, Spouse Lives with:  Facility Resident Do you feel safe going back to the place where you live?  Yes Need for family participation in patient care:  Yes (Comment)  Care giving concerns:  CSW received consult regarding discharge planning. CSW spoke with patient's spouse. She reports that patient resides at [facility] and will return there at discharge. CSW to continue to follow and assist with discharge planning needs.   Social Worker assessment / plan:  CSW spoke with patient's spouse regarding discharge plan.   Employment status:  Retired Nurse, adult PT Recommendations:  [place] / Referral to community resources:  [place]  Patient/Family's Response to care:  Patient's spouse prefers for patient to return to [facility]. They have in-house physical therapy that he can receive. She requests [facility] for transport.   Patient/Family's Understanding of and Emotional Response to Diagnosis, Current Treatment, and Prognosis:  Patient/family is realistic regarding therapy needs and expressed being hopeful for return to ALF. Patient's spouse expressed  understanding of CSW role and discharge process as well as medical condition. No questions/concerns about plan or treatment.    Emotional Assessment Appearance:  Appears stated age Attitude/Demeanor/Rapport:  Unable to Assess Affect (typically observed):  Unable to Assess Orientation:  Oriented to Self Alcohol / Substance use:  Not Applicable Psych involvement (Current and /or in the community):  No (Comment)  Discharge Needs  Concerns to be addressed:  Care Coordination Readmission within the last 30 days:  No Current discharge risk:  None Barriers to Discharge:  Continued Medical Work up   [employer], [place] 05/29/2018, 11:49 AM

## 2018-05-29 NOTE — Evaluation (Signed)
Physical Therapy Evaluation Patient Details Name: [name] MRN: [id number] DOB: 02/13/1927 Today's Date: 05/29/2018   History of Present Illness  Pt is an 84 y.o. male admitted from ALF on 05/28/18 after unwitnessed fall. Negative radiological work-up including CT head, CT spine, chest x-ray, knee x-ray. Positive for UTI. PMH includes dementia, HTN, CKD, bladder CA (chronic indwelling Foley catheter), CAD, recurrent falls, recurrent UTIs.     Clinical Impression  Pt presents with an overall decrease in functional mobility secondary to above. PTA, pt from SNF, requires assist for amb with RW, wheelchair mobility, and ADLs. Today, pt required maxA to sit EOB; further mobility limited by pt crying/moaning with back pain upon attempting to sit. Oriented to self and the fact that he is in the hospital; did not recall fall. Pt would benefit from continued acute PT services to maximize functional mobility and independence prior to return to SNF.     Follow Up Recommendations SNF;Supervision/Assistance - 24 hour    Equipment Recommendations  None recommended by PT    Recommendations for Other Services       Precautions / Restrictions Precautions Precautions: Fall Precaution Comments: Chronic foley  Restrictions Weight Bearing Restrictions: No      Mobility  Bed Mobility Overal bed mobility: Needs Assistance Bed Mobility: Supine to Sit;Sit to Supine     Supine to sit: Max assist Sit to supine: Max assist   General bed mobility comments: MaxA to assist trunk elevation and bring BLEs to EOB, pt yelling/crying with back pain upon sitting and grabbing for support from therapist; maxA to return to supine  Transfers                 General transfer comment: Deferred secondary to pain  Ambulation/Gait                Stairs            Wheelchair Mobility    Modified Rankin (Stroke Patients Only)       Balance Overall balance assessment: Needs  assistance   Sitting balance-Leahy Scale: Poor                                       Pertinent Vitals/Pain Pain Assessment: Faces Faces Pain Scale: Hurts whole lot Pain Location: Back when sitting Pain Descriptors / Indicators: Guarding;Grimacing;Crying;Moaning Pain Intervention(s): Limited activity within patient's tolerance;Repositioned    Home Living Family/patient expects to be discharged to:: Skilled nursing facility                      Prior Function Level of Independence: Needs assistance   Gait / Transfers Assistance Needed: Pt reports walks with RW or uses w/c; sits in chair most of day  ADL's / Homemaking Assistance Needed: Staff assists with ADL tasks        Hand Dominance        Extremity/Trunk Assessment   Upper Extremity Assessment Upper Extremity Assessment: Generalized weakness;Difficult to assess due to impaired cognition(Able to reach face with BUEs, required encouragement to use)    Lower Extremity Assessment Lower Extremity Assessment: Generalized weakness;Difficult to assess due to impaired cognition(Pt not assisting to bring BLEs to EOB or back to supine)    Cervical / Trunk Assessment Cervical / Trunk Assessment: Kyphotic  Communication   Communication: HOH(extremely HOH)  Cognition Arousal/Alertness: Awake/alert Behavior During Therapy: Flat affect Overall Cognitive Status:  History of cognitive impairments - at baseline Area of Impairment: Orientation;Attention;Memory;Following commands;Safety/judgement;Awareness;Problem solving                 Orientation Level: Disoriented to;Place;Time;Situation Current Attention Level: Sustained Memory: Decreased short-term memory Following Commands: Follows one step commands inconsistently Safety/Judgement: Decreased awareness of deficits;Decreased awareness of safety Awareness: Intellectual Problem Solving: Slow processing;Decreased initiation;Difficulty  sequencing;Requires verbal cues General Comments: Eyes closed initially despite pt seeming awake, once eyes cleaned, pt keeping eyes open and more interactive. Cognitive deficits exacerbated by extremely HOH. Knows he is at hospital but not which one, did not know he fell (later heard him tell MD he had fallen)      General Comments      Exercises     Assessment/Plan    PT Assessment Patient needs continued PT services  PT Problem List Decreased strength;Decreased range of motion;Decreased activity tolerance;Decreased balance;Decreased mobility;Pain       PT Treatment Interventions DME instruction;Gait training;Functional mobility training;Therapeutic activities;Therapeutic exercise;Balance training;Patient/family education;Cognitive remediation;Wheelchair mobility training    PT Goals (Current goals can be found in the Care Plan section)  Acute Rehab PT Goals Patient Stated Goal: Drink water Time For Goal Achievement: 06/12/18 Potential to Achieve Goals: Fair    Frequency Min 2X/week   Barriers to discharge        Co-evaluation               AM-PAC PT "6 Clicks" Daily Activity  Outcome Measure Difficulty turning over in bed (including adjusting bedclothes, sheets and blankets)?: Unable Difficulty moving from lying on back to sitting on the side of the bed? : Unable Difficulty sitting down on and standing up from a chair with arms (e.g., wheelchair, bedside commode, etc,.)?: Unable Help needed moving to and from a bed to chair (including a wheelchair)?: Total Help needed walking in hospital room?: Total Help needed climbing 3-5 steps with a railing? : Total 6 Click Score: 6    End of Session Equipment Utilized During Treatment: Oxygen Activity Tolerance: Patient limited by pain Patient left: in bed;with bed alarm set;with call bell/phone within reach Nurse Communication: Mobility status;Other (comment)(c/o pain) PT Visit Diagnosis: Other abnormalities of gait and  mobility (R26.89);Muscle weakness (generalized) (M62.81)    Time: 9211-9417 PT Time Calculation (min) (ACUTE ONLY): 26 min   Charges:   PT Evaluation $PT Eval Moderate Complexity: 1 Mod PT Treatments $Therapeutic Activity: 8-22 mins       Mabeline Caras, PT, DPT Acute Rehabilitation Services  Pager [phone] Office [phone]  Derry Lory 05/29/2018, 9:37 AM

## 2018-05-29 NOTE — Progress Notes (Signed)
PROGRESS NOTE                                                                                                                                                                                                             Patient Demographics:    [name], is a 84 y.o. male, DOB - [birth year], PRF:[id number]  Admit date - 05/28/2018   Admitting Physician Guilford Shi, MD  Outpatient Primary MD for the patient is Gregor Hams, FNP  LOS - 0   Chief Complaint  Patient presents with  . Fall       Brief Narrative    84 y.o. male with history h/o hypertension, hyperlipidemia, chronic kidney disease, dementia, prostrate and bladder cancer status post surgery and now on chronic indwelling Foley catheter, CAD, chronic systolic CHF with last EF 35 to 40%, NSVT, recurrent falls and recurrent urinary tract infections sent from assisted living facility today after a fall.   Subjective:    [name] today has, No headache, No chest pain, No abdominal pain -reports he is thirsty, awaiting SLP evaluation  Assessment  & Plan :    Active Problems:   UTI (urinary tract infection)    Metabolic encephalopathy -Likely related to UTI, now he is on fluids and antibiotics, appears to be improving.  Foley associated UTI -Foley catheter was changed on day 1, continue with Rocephin, follow urine cultures  Mild AKI: -Baseline 0.8, it is 1.2, continue to hold Lasix and continue with gentle hydration  Hypokalemia -Repleted, recheck in a.m. with magnesium  Chronic systolic CHF: -  Appears dry currently.  Continue to hold Lasix, continue gentle hydration  Bladder cancer/prostate cancer:  - Status post surgery chemo and radiation.  On indwelling Foley catheter.   CAD: Previously on aspirin which was recently held, unclear reason.?  Secondary to falls.  Mildly elevated troponin noted but similar to prior labs.  Defer further work-up as he did have  recent echo in March 2019.  Denies any chest pain.  EKG unremarkable.  Will however resume baby aspirin.  Sacral decubitus: Does not appear infected.  Resume prior treatment, wound care consulted  Fall: CT head/chest x-ray/knee/pelvic x-ray negative for any acute injuries.  Fall precautions ordered.  Will have Zyprexa available for agitation.  Avoid benzodiazepines or other sedatives in this patient with high risk for  falls.  He is on hydroxyzine at baseline, will resume at lower dose.    Code Status : partial  Family Communication  : none at bedside  Disposition Plan  : SNF :   Consults  :  none  Procedures  : none  DVT Prophylaxis  :  lovenox  Lab Results  Component Value Date   PLT 303 05/29/2018    Antibiotics  :    Anti-infectives (From admission, onward)   Start     Dose/Rate Route Frequency Ordered Stop   05/29/18 1300  cefTRIAXone (ROCEPHIN) 1 g in sodium chloride 0.9 % 100 mL IVPB     1 g 200 mL/hr over 30 Minutes Intravenous Every 24 hours 05/28/18 1324     05/28/18 1330  ciprofloxacin (CIPRO) 500 MG/5ML (10%) suspension 500 mg  Status:  Discontinued    Note to Pharmacy:  For 5 days     500 mg Oral 2 times daily 05/28/18 1323 05/28/18 1415   05/28/18 1330  cefTRIAXone (ROCEPHIN) 1 g in sodium chloride 0.9 % 100 mL IVPB  Status:  Discontinued     1 g 200 mL/hr over 30 Minutes Intravenous Every 24 hours 05/28/18 1323 05/28/18 1324   05/28/18 1245  cefTRIAXone (ROCEPHIN) 1 g in sodium chloride 0.9 % 100 mL IVPB     1 g 200 mL/hr over 30 Minutes Intravenous  Once 05/28/18 1242 05/28/18 1402        Objective:   Vitals:   05/28/18 1530 05/28/18 1610 05/28/18 2234 05/29/18 0520  BP: (!) 137/92 (!) 164/93 109/61 136/81  Pulse: 83 (!) 139 77 90  Resp: (!) 23 (!) 24 (!) 21 19  Temp:  98.1 F (36.7 C)  (!) 97.4 F (36.3 C)  TempSrc:  Oral    SpO2: 100% 93% 99% 99%  Weight:      Height:        Wt Readings from Last 3 Encounters:  05/28/18 73 kg    04/24/18 73 kg  04/20/18 93 kg     Intake/Output Summary (Last 24 hours) at 05/29/2018 1450 Last data filed at 05/29/2018 0650 Gross per 24 hour  Intake 1536.76 ml  Output -  Net 1536.76 ml     Physical Exam  Awake Alert, Oriented X 2, pleasant, mildly confused, extremely hard of hearing Symmetrical Chest wall movement, Good air movement bilaterally, CTAB RRR,No Gallops,Rubs or new Murmurs, No Parasternal Heave +ve B.Sounds, Abd Soft, No tenderness,  No rebound - guarding or rigidity. No Cyanosis, Clubbing or edema, No new Rash or bruise  Stage III sacral pressure ulcer    Data Review:    CBC Recent Labs  Lab 05/28/18 0300 05/28/18 1051 05/29/18 0421  WBC 16.2* 14.4* 12.2*  HGB 13.8 13.2 12.7*  HCT 42.8 40.6 39.6  PLT 318 299 303  MCV 89.0 89.4 90.0  MCH 28.7 29.1 28.9  MCHC 32.2 32.5 32.1  RDW 12.3 12.4 12.4  LYMPHSABS  --  2.8  --   MONOABS  --  1.6*  --   EOSABS  --  0.3  --   BASOSABS  --  0.1  --     Chemistries  Recent Labs  Lab 05/28/18 0300 05/28/18 1051 05/28/18 1635 05/29/18 0421  NA 137 137 137 138  K 2.7* 2.5* 2.9* 2.9*  CL 88* 90* 91* 96*  CO2 29 36* 33* 32  GLUCOSE 110* 118* 109* 89  BUN 7* 7* 7* 6*  CREATININE 1.23 1.19 1.10 1.08  CALCIUM 9.1 8.8* 8.6* 8.4*  AST  --   --   --  18  ALT  --   --   --  10  ALKPHOS  --   --   --  94  BILITOT  --   --   --  1.0   ------------------------------------------------------------------------------------------------------------------ No results for input(s): CHOL, HDL, LDLCALC, TRIG, CHOLHDL, LDLDIRECT in the last 72 hours.  Lab Results  Component Value Date   HGBA1C 5.8 (H) 01/04/2017   ------------------------------------------------------------------------------------------------------------------ No results for input(s): TSH, T4TOTAL, T3FREE, THYROIDAB in the last 72 hours.  Invalid input(s):  FREET3 ------------------------------------------------------------------------------------------------------------------ No results for input(s): VITAMINB12, FOLATE, FERRITIN, TIBC, IRON, RETICCTPCT in the last 72 hours.  Coagulation profile No results for input(s): INR, PROTIME in the last 168 hours.  No results for input(s): DDIMER in the last 72 hours.  Cardiac Enzymes Recent Labs  Lab 05/28/18 0300  TROPONINI 0.09*   ------------------------------------------------------------------------------------------------------------------    Component Value Date/Time   BNP 666.7 (H) 12/08/2017 0415    Inpatient Medications  Scheduled Meds: . aspirin EC  81 mg Oral Daily  . collagenase   Topical Daily  . docusate sodium  100 mg Oral BID  . enoxaparin (LOVENOX) injection  40 mg Subcutaneous Q24H  . hydrOXYzine  12.5 mg Oral BID   Continuous Infusions: . cefTRIAXone (ROCEPHIN)  IV    . lactated ringers 75 mL/hr at 05/29/18 0650   PRN Meds:.acetaminophen, barrier cream, OLANZapine, ondansetron **OR** ondansetron (ZOFRAN) IV, prednisoLONE acetate, QUEtiapine  Micro Results Recent Results (from the past 240 hour(s))  Culture, blood (routine x 2)     Status: None (Preliminary result)   Collection Time: 05/28/18 11:30 AM  Result Value Ref Range Status   Specimen Description BLOOD RIGHT ANTECUBITAL  Final   Special Requests   Final    BOTTLES DRAWN AEROBIC AND ANAEROBIC Blood Culture adequate volume   Culture   Final    NO GROWTH 1 DAY Performed at [facility], [place] [address], [place], [place] [address]    Report Status PENDING  Incomplete  Culture, blood (routine x 2)     Status: None (Preliminary result)   Collection Time: 05/28/18 11:41 AM  Result Value Ref Range Status   Specimen Description BLOOD RIGHT FOREARM  Final   Special Requests   Final    BOTTLES DRAWN AEROBIC AND ANAEROBIC Blood Culture adequate volume   Culture   Final    NO GROWTH 1 DAY Performed at  [facility], [place] [address] [place], [place] [address]    Report Status PENDING  Incomplete  Urine culture     Status: Abnormal   Collection Time: 05/28/18 12:35 PM  Result Value Ref Range Status   Specimen Description URINE, CLEAN CATCH  Final   Special Requests   Final    NONE Performed at [facility], [place] [address] [place], [place] [address]    Culture MULTIPLE SPECIES PRESENT, SUGGEST RECOLLECTION (A)  Final   Report Status 05/29/2018 FINAL  Final    Radiology Reports Dg Chest 2 View  Result Date: 05/28/2018 CLINICAL DATA:  History of recent fall, patient from nursing home, non communicative EXAM: CHEST - 2 VIEW COMPARISON:  Portable chest x-ray of 02/24/2018 FINDINGS: No active infiltrate or effusion is seen. Mediastinal and hilar contours are unremarkable. There is apparent persistent elevation of the right hemidiaphragm. Mild cardiomegaly is stable. There are degenerative changes throughout the thoracic spine. IMPRESSION: No active lung  disease. Stable mild cardiomegaly and apparent chronic elevation of the right hemidiaphragm. Electronically Signed   By: Ivar Drape M.D.   On: 05/28/2018 11:16   Ct Head Wo Contrast  Result Date: 05/28/2018 CLINICAL DATA:  Found on floor.  Headache. EXAM: CT HEAD WITHOUT CONTRAST CT CERVICAL SPINE WITHOUT CONTRAST TECHNIQUE: Multidetector CT imaging of the head and cervical spine was performed following the standard protocol without intravenous contrast. Multiplanar CT image reconstructions of the cervical spine were also generated. COMPARISON:  04/22/2018 FINDINGS: CT HEAD FINDINGS Brain: There is atrophy and chronic small vessel disease changes. No acute intracranial abnormality. Specifically, no hemorrhage, hydrocephalus, mass lesion, acute infarction, or significant intracranial injury. Vascular: No hyperdense vessel or unexpected calcification. Skull: No acute calvarial abnormality. Sinuses/Orbits: Opacified frontal sinuses, ethmoid  air cells and right maxillary sinus. Mucosal thickening in the left maxillary sinus. Opacified left mastoid air cells. These findings are stable since prior study. Other: None CT CERVICAL SPINE FINDINGS Alignment: No subluxation. Skull base and vertebrae: No acute fracture. No primary bone lesion or focal pathologic process. Soft tissues and spinal canal: No prevertebral fluid or swelling. No visible canal hematoma. Disc levels: Diffuse degenerative disc disease and facet disease, moderate to advanced. Upper chest: Diffuse aortic arch and great vessel calcifications. No acute findings. Other: Carotid artery calcifications.  No acute findings. IMPRESSION: No acute intracranial abnormality. Atrophy, chronic microvascular disease. Pain and chronic sinusitis. Opacified left mastoid air cells, stable. Diffuse degenerative disc and facet disease in the cervical spine. No acute bony abnormality. Electronically Signed   By: Rolm Baptise M.D.   On: 05/28/2018 10:29   Ct Cervical Spine Wo Contrast  Result Date: 05/28/2018 CLINICAL DATA:  Found on floor.  Headache. EXAM: CT HEAD WITHOUT CONTRAST CT CERVICAL SPINE WITHOUT CONTRAST TECHNIQUE: Multidetector CT imaging of the head and cervical spine was performed following the standard protocol without intravenous contrast. Multiplanar CT image reconstructions of the cervical spine were also generated. COMPARISON:  04/22/2018 FINDINGS: CT HEAD FINDINGS Brain: There is atrophy and chronic small vessel disease changes. No acute intracranial abnormality. Specifically, no hemorrhage, hydrocephalus, mass lesion, acute infarction, or significant intracranial injury. Vascular: No hyperdense vessel or unexpected calcification. Skull: No acute calvarial abnormality. Sinuses/Orbits: Opacified frontal sinuses, ethmoid air cells and right maxillary sinus. Mucosal thickening in the left maxillary sinus. Opacified left mastoid air cells. These findings are stable since prior study. Other:  None CT CERVICAL SPINE FINDINGS Alignment: No subluxation. Skull base and vertebrae: No acute fracture. No primary bone lesion or focal pathologic process. Soft tissues and spinal canal: No prevertebral fluid or swelling. No visible canal hematoma. Disc levels: Diffuse degenerative disc disease and facet disease, moderate to advanced. Upper chest: Diffuse aortic arch and great vessel calcifications. No acute findings. Other: Carotid artery calcifications.  No acute findings. IMPRESSION: No acute intracranial abnormality. Atrophy, chronic microvascular disease. Pain and chronic sinusitis. Opacified left mastoid air cells, stable. Diffuse degenerative disc and facet disease in the cervical spine. No acute bony abnormality. Electronically Signed   By: Rolm Baptise M.D.   On: 05/28/2018 10:29   Dg Knee Complete 4 Views Left  Result Date: 05/28/2018 CLINICAL DATA:  Fall, pain EXAM: LEFT KNEE - COMPLETE 4+ VIEW COMPARISON:  None. FINDINGS: No acute bony abnormality. Specifically, no fracture, subluxation, or dislocation. No joint effusion. Joint spaces maintained. Vascular calcifications noted. IMPRESSION: No acute bony abnormality. Electronically Signed   By: Rolm Baptise M.D.   On: 05/28/2018 07:00   Dg Hip Unilat  W Or Wo Pelvis 2-3 Views Right  Result Date: 05/28/2018 CLINICAL DATA:  Fall EXAM: DG HIP (WITH OR WITHOUT PELVIS) 2-3V RIGHT COMPARISON:  None. FINDINGS: Degenerative changes in the hips bilaterally with joint space narrowing and spurring. SI joints are symmetric and unremarkable. Radiation seeds in the pelvis. No acute bony abnormality. Specifically, no fracture, subluxation, or dislocation. IMPRESSION: No acute bony abnormality. Mild degenerative changes in the hips bilaterally. Electronically Signed   By: Rolm Baptise M.D.   On: 05/28/2018 07:01     Phillips Climes M.D on 05/29/2018 at 2:50 PM  Between 7am to 7pm - Pager - [phone]  After 7pm go to [email or web address] - password [facility]  [facility] -  Office  [phone]

## 2018-05-29 NOTE — Progress Notes (Signed)
HIPAA AUTHORIZATION PLACED IN PATIENT CHART

## 2018-05-29 NOTE — Evaluation (Signed)
Clinical/Bedside Swallow Evaluation Patient Details  Name: [name] MRN: [id number] Date of Birth: 1927/01/07  Today's Date: 05/29/2018 Time: SLP Start Time (ACUTE ONLY): 0910 SLP Stop Time (ACUTE ONLY): 0935 SLP Time Calculation (min) (ACUTE ONLY): 25 min  Past Medical History:  Past Medical History:  Diagnosis Date  . A-fib ([place])   . AKI (acute kidney injury) ([place])   . Arthritis   . Benign hypertension with chronic kidney disease, stage III ([place]) 11/28/2015  . Bladder cancer ([place])   . CAD (coronary artery disease) 05/31/2016   cath 02/28/11: dLM 10%, pLAD 20%, pRCA calcified nodule 50% (FFR 0.86 - not significant), mRCA 40-50%, mildly elevated filling pressures, EF 50-55%  . Cataract   . Chronic kidney disease   . CKD (chronic kidney disease) stage 3, GFR 30-59 ml/min (HCC)   . CKD (chronic kidney disease), stage III ([place]) 11/28/2015   follows up with Dr. Clydell Hakim Last creatinine 1.36, EGFR 50  . Dyslipidemia   . First degree AV block 06/28/2013  . Gait abnormality 05/26/2017  . Glaucoma   . Hard of hearing   . Hyperlipidemia 11/28/2015   Last Assessment & Plan: Lipids were at goal a year ago; continue statin  . Hypertension    LOV with EKG 06/11/11 Dr Caryl Comes  [facility]  . Malignant neoplasm of prostate [facility])    surgery and radiation  . Malignant neoplasm of urinary bladder ([place]) 07/01/2012   surgery and chemo  . Memory difficulty 05/26/2017  . NSVT (nonsustained ventricular tachycardia) ([place]) 11/28/2015   Overview: Overview: Overview: echo 6/12: EF 55%, basal inferolat AK, mild LVH Last Assessment & Plan: resolved Overview: echo 6/12: EF 55%, basal inferolat AK, mild LVH Last Assessment & Plan: resolved  . Paroxysmal ventricular tachycardia ([place]) 05/31/2016   Overview: Overview: echo 6/12: EF 55%, basal inferolat AK, mild LVH Last Assessment & Plan: resolved  . Prostate cancer ([place])    status post brachytherapy---in 2003  . PVC (premature ventricular contraction)   . Sleep  apnea    STOP BANG SCORE 4  . Ventricular bigeminy 05/31/2016   Overview: Overview: rhythmol Date PR QRS 6/12 23 088 10/13 24 110 10/14 31 109 Last Assessment & Plan: The patient is without symptomatic oral echocardiographic ectopy. His exercise tolerance is quite good. I am concerned about the interval prolongation of his PR interval. We will follow this closely. It is potentially related to the presence of his 1C antiarrhythmic and we may need to discontinue  . Ventricular tachycardia, nonsustained/bigeminy    echo 6/12: EF 55%, basal inferolat AK, mild LVH   Past Surgical History:  Past Surgical History:  Procedure Laterality Date  . APPENDECTOMY  1951  . BLADDER SURGERY  2013   followed by chemo  . CARDIAC CATHETERIZATION  6/12  . CATARACT EXTRACTION W/ INTRAOCULAR LENS IMPLANT Right 2016   Right eye [facility] Dr. Lennox Pippins  . CATARACT EXTRACTION W/ INTRAOCULAR LENS IMPLANT Left 2014  . CORONARY ANGIOPLASTY     no significant blockage requiring stenting per patient  . CYSTOSCOPY  05/20/2012   Procedure: CYSTOSCOPY;  Surgeon: Alexis Frock, MD;  Location: [facility];  Service: Urology;  Laterality: N/A;  . CYSTOSCOPY  07/01/2012   Procedure: CYSTOSCOPY;  Surgeon: Molli Hazard, MD;  Location: [facility];  Service: Urology;  Laterality: N/A;  . DESCEMETS STRIPPING AUTOMATED ENDOTHELIAL KERATOPLASTY Right 06/13/2016   Procedure: DESCEMETS STRIPPING ENDOTHELIAL KERATOPLASTY; Surgeon: Marilynne Halsted, MD; Location: [facility] MAIN OR; Service: Ophthalmology; Laterality: Right;  .  DESCEMETS STRIPPING AUTOMATED ENDOTHELIAL KERATOPLASTY Right 11/14/2016   Procedure: DESCEMETS STRIPPING ENDOTHELIAL KERATOPLASTY; Surgeon: Marilynne Halsted, MD; Location: [facility] MAIN OR; Service: Ophthalmology; Laterality: Right;  . EYE SURGERY     left cataract extraction with IOL  . PROSTATE SURGERY  2003   followed by radiation  . SKIN BIOPSY     BCC excised   . SKIN CANCER EXCISION     x 4  . TRANSURETHRAL  RESECTION OF BLADDER TUMOR  05/20/2012   Procedure: TRANSURETHRAL RESECTION OF BLADDER TUMOR (TURBT);  Surgeon: Alexis Frock, MD;  Location: [facility];  Service: Urology;  Laterality: N/A;  [place]   . TRANSURETHRAL RESECTION OF BLADDER TUMOR  07/01/2012   Procedure: TRANSURETHRAL RESECTION OF BLADDER TUMOR (TURBT);  Surgeon: Molli Hazard, MD;  Location: [facility];  Service: Urology;  Laterality: N/A;  Cystoscopy, TURBT, be prepared for Open Cystorraphy       HPI:  84 y.o. male with history h/o hypertension, hyperlipidemia, chronic kidney disease, dementia, prostrate and bladder cancer status post surgery and now on chronic indwelling Foley catheter, CAD, chronic systolic CHF with last EF 35 to 40%, NSVT, recurrent falls and recurrent urinary tract infections sent from assisted living facility 05/28/18 after a fall. CXR negative   Assessment / Plan / Recommendation Clinical Impression  Pt seen at bedside for assessment of swallow function and safety, and to identify least restrictive diet. Pt has been seen by this SLP in the past, and presents in similar fashion today. Oral motor strength and function are adequate. Pt has missing dentition. Trials of thin liquid,, puree, and solid consistencies were given. Pt tolerated each without oral difficulty or overt s/s aspiration. Frequent belching noted, raising concern for esophageal issues. Recommend regular diet with thin liquids, and adherence to reflux precautions. This information was posted at [facility]. No further ST intervention recommended at this time. Please reconsult if needs arise. RN and MD informed of results and recommendations.   SLP Visit Diagnosis: Dysphagia, unspecified (R13.10)    Aspiration Risk  Mild aspiration risk    Diet Recommendation Thin liquid;Regular   Liquid Administration via: Cup;Straw Medication Administration: (per pt preference) Supervision: Staff to assist with self feeding;Full supervision/cueing for compensatory  strategies Compensations: Minimize environmental distractions;Slow rate;Small sips/bites Postural Changes: Seated upright at 90 degrees;Remain upright for at least 30 minutes after po intake    Other  Recommendations Oral Care Recommendations: Oral care BID   Follow up Recommendations 24 hour supervision/assistance          Prognosis Prognosis for Safe Diet Advancement: Good      Swallow Study   General Date of Onset: 05/28/18 HPI: 84 y.o. male with history h/o hypertension, hyperlipidemia, chronic kidney disease, dementia, prostrate and bladder cancer status post surgery and now on chronic indwelling Foley catheter, CAD, chronic systolic CHF with last EF 35 to 40%, NSVT, recurrent falls and recurrent urinary tract infections sent from assisted living facility 05/28/18 after a fall. CXR negative Type of Study: Bedside Swallow Evaluation Previous Swallow Assessment: June and August 2019 - reg/thin recommended Diet Prior to this Study: NPO Temperature Spikes Noted: No Respiratory Status: Nasal cannula History of Recent Intubation: (P) No Behavior/Cognition: Alert;Cooperative;Pleasant mood(HOH) Oral Cavity Assessment: Dry Oral Care Completed by SLP: No Oral Cavity - Dentition: Poor condition;Missing dentition Vision: Functional for self-feeding Self-Feeding Abilities: Total assist Patient Positioning: Upright in bed Baseline Vocal Quality: Normal Volitional Cough: Strong Volitional Swallow: Able to elicit    Oral/Motor/Sensory Function Overall Oral Motor/Sensory Function: Within  functional limits   Ice Chips Ice chips: Within functional limits Presentation: Spoon   Thin Liquid Thin Liquid: Within functional limits Presentation: Straw    Nectar Thick Nectar Thick Liquid: Not tested   Honey Thick Honey Thick Liquid: Not tested   Puree Puree: Within functional limits Presentation: Spoon   Solid     Solid: Within functional limits     Maribell Demeo B. Quentin Ore [facility], [place] Speech  Language Pathologist [phone]  Shonna Chock 05/29/2018,9:41 AM

## 2018-05-30 DIAGNOSIS — N3000 Acute cystitis without hematuria: Secondary | ICD-10-CM | POA: Diagnosis not present

## 2018-05-30 LAB — BASIC METABOLIC PANEL
Anion gap: 9 (ref 5–15)
BUN: <5 mg/dL — ABNORMAL LOW (ref 8–23)
CALCIUM: 8.1 mg/dL — AB (ref 8.9–10.3)
CHLORIDE: 95 mmol/L — AB (ref 98–111)
CO2: 32 mmol/L (ref 22–32)
CREATININE: 0.85 mg/dL (ref 0.61–1.24)
GFR calc Af Amer: >60 mL/min (ref >60)
GFR calc non Af Amer: >60 mL/min (ref >60)
GLUCOSE: 81 mg/dL (ref 70–99)
Potassium: 2.7 mmol/L — CL (ref 3.5–5.1)
Sodium: 136 mmol/L (ref 135–145)

## 2018-05-30 LAB — MAGNESIUM: Magnesium: 1.8 mg/dL (ref 1.7–2.4)

## 2018-05-30 LAB — POTASSIUM: Potassium: 3.6 mmol/L (ref 3.5–5.1)

## 2018-05-30 MED ORDER — POTASSIUM CHLORIDE 10 MEQ/100ML IV SOLN
INTRAVENOUS | Status: AC
  Filled 2018-05-30: qty 100

## 2018-05-30 MED ORDER — LACTINEX PO CHEW: 1.0000 | CHEWABLE_TABLET | Freq: Three times a day (TID) | ORAL | 0 refills | Status: DC

## 2018-05-30 MED ORDER — POTASSIUM CHLORIDE 10 MEQ/100ML IV SOLN: 10.0000 meq | INTRAVENOUS | Status: DC

## 2018-05-30 MED ORDER — POTASSIUM CHLORIDE 10 MEQ/100ML IV SOLN
10.0000 meq | INTRAVENOUS | Status: AC
  Administered 2018-05-30 (×3): 10 meq via INTRAVENOUS
  Filled 2018-05-30 (×2): qty 100

## 2018-05-30 MED ORDER — FUROSEMIDE 20 MG PO TABS: 20.0000 mg | ORAL_TABLET | Freq: Every day | ORAL | Status: DC

## 2018-05-30 MED ORDER — CEFPODOXIME PROXETIL 100 MG PO TABS: 100.0000 mg | ORAL_TABLET | Freq: Two times a day (BID) | ORAL | 0 refills | Status: DC

## 2018-05-30 MED ORDER — POTASSIUM CHLORIDE CRYS ER 20 MEQ PO TBCR
40.0000 meq | EXTENDED_RELEASE_TABLET | ORAL | Status: AC
  Administered 2018-05-30 (×2): 40 meq via ORAL
  Filled 2018-05-30 (×2): qty 2

## 2018-05-30 MED ORDER — POTASSIUM CHLORIDE ER 10 MEQ PO TBCR: EXTENDED_RELEASE_TABLET | ORAL | 0 refills | Status: DC

## 2018-05-30 MED ORDER — ASPIRIN 81 MG PO TBEC: 81.0000 mg | DELAYED_RELEASE_TABLET | Freq: Every day | ORAL | 0 refills | Status: DC

## 2018-05-30 MED ORDER — POTASSIUM CHLORIDE CRYS ER 20 MEQ PO TBCR: 40.0000 meq | EXTENDED_RELEASE_TABLET | ORAL | Status: DC

## 2018-05-30 MED ORDER — POTASSIUM CHLORIDE CRYS ER 20 MEQ PO TBCR
20.0000 meq | EXTENDED_RELEASE_TABLET | Freq: Once | ORAL | Status: AC
  Administered 2018-05-30: 20 meq via ORAL
  Filled 2018-05-30: qty 1

## 2018-05-30 NOTE — Progress Notes (Signed)
[name] to be D/C'd [facility] per MD order.  Discussed with the patient and all questions fully answered.  VSS, Skin clean, dry and intact without evidence of skin break down, no evidence of skin tears noted. IV catheter discontinued intact. Site without signs and symptoms of complications. Dressing and pressure applied.  An After Visit Summary was printed and given to the patient. Patient received prescription.  D/c education completed with patient/family including follow up instructions, medication list, d/c activities limitations if indicated, with other d/c instructions as indicated by MD - patient able to verbalize understanding, all questions fully answered.   Patient instructed to return to ED, call 911, or call MD for any changes in condition.   Patient escorted via stretcher, and D/C [name] via PTAR.  Holley Raring 05/30/2018 5:39 PM   Awaiting for PTAR to arrive.

## 2018-05-30 NOTE — Progress Notes (Signed)
Unable to get through to phlebotomy to ask about drawing the STAT Potassium.

## 2018-05-30 NOTE — Progress Notes (Signed)
Patient will Discharge To:[facility] Anticipated DC Date:05/30/18 Family Notified:yes, Taysean Wager Transport By: EMS   Per MD patient ready for DC to [facility] . RN, patient, patient's family, and facility notified of DC. Assessment, Fl2/Pasrr, and Discharge Summary sent to facility. RN given number for report [phone] ask for a med tech to take report, Room # 27). DC packet on chart. Ambulance transport requested for patient.   CSW signing off.  Reed Breech LCSWA [phone]

## 2018-05-30 NOTE — Discharge Instructions (Signed)
Follow with Primary MD Gregor Hams, FNP in 7 days   Get CBC, CMP,checked  by Primary MD next visit.    Activity: As tolerated with Full fall precautions use walker/cane & assistance as needed   Disposition ALF   Diet: Heart Healthy  , with feeding assistance and aspiration precautions.  For Heart failure patients - Check your Weight same time everyday, if you gain over 2 pounds, or you develop in leg swelling, experience more shortness of breath or chest pain, call your Primary MD immediately. Follow Cardiac Low Salt Diet and 1.5 lit/day fluid restriction.   On your next visit with your primary care physician please Get Medicines reviewed and adjusted.   Please request your Prim.MD to go over all Hospital Tests and Procedure/Radiological results at the follow up, please get all Hospital records sent to your Prim MD by signing hospital release before you go home.   If you experience worsening of your admission symptoms, develop shortness of breath, life threatening emergency, suicidal or homicidal thoughts you must seek medical attention immediately by calling 911 or calling your MD immediately  if symptoms less severe.  You Must read complete instructions/literature along with all the possible adverse reactions/side effects for all the Medicines you take and that have been prescribed to you. Take any new Medicines after you have completely understood and accpet all the possible adverse reactions/side effects.   Do not drive, operating heavy machinery, perform activities at heights, swimming or participation in water activities or provide baby sitting services if your were admitted for syncope or siezures until you have seen by Primary MD or a Neurologist and advised to do so again.  Do not drive when taking Pain medications.    Do not take more than prescribed Pain, Sleep and Anxiety Medications  Special Instructions: If you have smoked or chewed Tobacco  in the last 2 yrs  please stop smoking, stop any regular Alcohol  and or any Recreational drug use.  Wear Seat belts while driving.   Please note  You were cared for by a hospitalist during your hospital stay. If you have any questions about your discharge medications or the care you received while you were in the hospital after you are discharged, you can call the unit and asked to speak with the hospitalist on call if the hospitalist that took care of you is not available. Once you are discharged, your primary care physician will handle any further medical issues. Please note that NO REFILLS for any discharge medications will be authorized once you are discharged, as it is imperative that you return to your primary care physician (or establish a relationship with a primary care physician if you do not have one) for your aftercare needs so that they can reassess your need for medications and monitor your lab values.

## 2018-05-30 NOTE — Discharge Summary (Signed)
[name], is a 84 y.o. male  DOB 1926/10/18  MRN [id number].  Admission date:  05/28/2018  Admitting Physician  Guilford Shi, MD  Discharge Date:  05/30/2018   Primary MD  Gregor Hams, FNP  Recommendations for primary care physician for things to follow:  -Please check CBC, BMP in 3 days.   Admission Diagnosis  Fall, initial encounter [W19.XXXA]   Discharge Diagnosis  Fall, initial encounter [W19.XXXA]   Active Problems:   UTI (urinary tract infection)      Past Medical History:  Diagnosis Date  . A-fib ([place])   . AKI (acute kidney injury) ([place])   . Arthritis   . Benign hypertension with chronic kidney disease, stage III ([place]) 11/28/2015  . Bladder cancer ([place])   . CAD (coronary artery disease) 05/31/2016   cath 02/28/11: dLM 10%, pLAD 20%, pRCA calcified nodule 50% (FFR 0.86 - not significant), mRCA 40-50%, mildly elevated filling pressures, EF 50-55%  . Cataract   . Chronic kidney disease   . CKD (chronic kidney disease) stage 3, GFR 30-59 ml/min (HCC)   . CKD (chronic kidney disease), stage III ([place]) 11/28/2015   follows up with Dr. Clydell Hakim Last creatinine 1.36, EGFR 50  . Dyslipidemia   . First degree AV block 06/28/2013  . Gait abnormality 05/26/2017  . Glaucoma   . Hard of hearing   . Hyperlipidemia 11/28/2015   Last Assessment & Plan: Lipids were at goal a year ago; continue statin  . Hypertension    LOV with EKG 06/11/11 Dr Caryl Comes  [facility]  . Malignant neoplasm of prostate [facility])    surgery and radiation  . Malignant neoplasm of urinary bladder ([place]) 07/01/2012   surgery and chemo  . Memory difficulty 05/26/2017  . NSVT (nonsustained ventricular tachycardia) ([place]) 11/28/2015   Overview: Overview: Overview: echo 6/12: EF 55%, basal inferolat AK, mild LVH Last Assessment & Plan: resolved Overview: echo 6/12: EF 55%, basal inferolat AK, mild LVH Last Assessment & Plan:  resolved  . Paroxysmal ventricular tachycardia ([place]) 05/31/2016   Overview: Overview: echo 6/12: EF 55%, basal inferolat AK, mild LVH Last Assessment & Plan: resolved  . Prostate cancer ([place])    status post brachytherapy---in 2003  . PVC (premature ventricular contraction)   . Sleep apnea    STOP BANG SCORE 4  . Ventricular bigeminy 05/31/2016   Overview: Overview: rhythmol Date PR QRS 6/12 23 088 10/13 24 110 10/14 31 109 Last Assessment & Plan: The patient is without symptomatic oral echocardiographic ectopy. His exercise tolerance is quite good. I am concerned about the interval prolongation of his PR interval. We will follow this closely. It is potentially related to the presence of his 1C antiarrhythmic and we may need to discontinue  . Ventricular tachycardia, nonsustained/bigeminy    echo 6/12: EF 55%, basal inferolat AK, mild LVH    Past Surgical History:  Procedure Laterality Date  . APPENDECTOMY  1951  . BLADDER SURGERY  2013   followed by chemo  . CARDIAC CATHETERIZATION  6/12  . CATARACT EXTRACTION W/ INTRAOCULAR LENS IMPLANT Right 2016   Right eye [facility] Dr. Lennox Pippins  . CATARACT EXTRACTION W/ INTRAOCULAR LENS IMPLANT Left 2014  . CORONARY ANGIOPLASTY     no significant blockage requiring stenting per patient  . CYSTOSCOPY  05/20/2012   Procedure: CYSTOSCOPY;  Surgeon: Alexis Frock, MD;  Location: [facility];  Service: Urology;  Laterality: N/A;  . CYSTOSCOPY  07/01/2012   Procedure: CYSTOSCOPY;  Surgeon: Molli Hazard, MD;  Location: [facility];  Service: Urology;  Laterality: N/A;  . DESCEMETS STRIPPING AUTOMATED ENDOTHELIAL KERATOPLASTY Right 06/13/2016   Procedure: DESCEMETS STRIPPING ENDOTHELIAL KERATOPLASTY; Surgeon: Marilynne Halsted, MD; Location: [facility]; Service: Ophthalmology; Laterality: Right;  . DESCEMETS STRIPPING AUTOMATED ENDOTHELIAL KERATOPLASTY Right 11/14/2016   Procedure: DESCEMETS STRIPPING ENDOTHELIAL KERATOPLASTY; Surgeon: Marilynne Halsted, MD; Location: [facility]; Service: Ophthalmology; Laterality: Right;  . EYE SURGERY     left cataract extraction with IOL  . PROSTATE SURGERY  2003   followed by radiation  . SKIN BIOPSY     BCC excised   . SKIN CANCER EXCISION     x 4  . TRANSURETHRAL RESECTION OF BLADDER TUMOR  05/20/2012   Procedure: TRANSURETHRAL RESECTION OF BLADDER TUMOR (TURBT);  Surgeon: Alexis Frock, MD;  Location: [facility];  Service: Urology;  Laterality: N/A;  [place]   . TRANSURETHRAL RESECTION OF BLADDER TUMOR  07/01/2012   Procedure: TRANSURETHRAL RESECTION OF BLADDER TUMOR (TURBT);  Surgeon: Molli Hazard, MD;  Location: [facility];  Service: Urology;  Laterality: N/A;  Cystoscopy, TURBT, be prepared for Open Cystorraphy           History of present illness and  Hospital Course:     Kindly see H&P for history of present illness and admission details, please review complete Labs, Consult reports and Test reports for all details in brief  HPI  from the history and physical done on the day of admission 05/28/2018  HPI: [name] is a 84 y.o. male with history h/o hypertension, hyperlipidemia, chronic kidney disease, dementia, prostrate and bladder cancer status post surgery and now on chronic indwelling Foley catheter, CAD, chronic systolic CHF with last EF 35 to 40%, NSVT, recurrent falls and recurrent urinary tract infections sent from assisted living facility today after a fall. Patient currently unable to provide much of history and somnolent.  Most of the history obtained by talking to wife and primary nurse (JJ at [phone]) at the nursing facility.  Patient at baseline has dementia, poor oral intake (eats about 2 meals a day) and is mostly wheelchair-bound.  Apparently son who was visiting him 2 days back noticed that he was hallucinating (talking to people who weren't there) and reported to nursing staff .  Currently UA was sent which was abnormal and patient was started on  ciprofloxacin (he received 1 dose).  Last night he was hallucinating again and trying to get out of bed.  He was reoriented but no report of having a sitter/being medicated.  Around 1 AM he was found on the floor after suffering a fall and reported that he hit his head.  He also was noted to have skin abrasions on right knee and elbow.  He was subsequently sent to ED here where work-up revealed abnormal UA with pyuria, leukocytosis with WBC of 16 K, elevated creatinine, low potassium, mildly abnormal troponin similar to prior presentations, negative radiological work-up including CT head, CT spine, chest x-ray, knee x-ray.  He received  IV fluids, IV potassium and Rocephin has just been started as previous urine cultures showed resistance to Cipro but sensitivity to cephalosporins.  Hospitalist service requested to admit patient for further evaluation and management.  Patient noted to be on 2 L nasal cannula and in deep sleep with mild breathing.  He is easily arousable and mental status appears to be improving currently.  He denied any pain but stated he was tired and wanted to sleep.  When asked about eating, he stated he was not hungry.      Hospital Course    Metabolic encephalopathy -Likely related to UTI, now he is on fluids and antibiotics, appears to be improving.  Foley associated UTI -Foley catheter was changed on day 1, with Rocephin during hospital stay, urine culture unfortunately were unhelpful as it did grow multiple species, most recent culture was, where it did grow Proteus, sensitive to Rocephin, will be discharged on another 4 days of oral Ceftin for total dose of 7 days treatment.  Mild AKI: -Baseline 0.8, it is 1.2, back to baseline, it is 0.8 on discharge.  Hypokalemia -Repleted, he required multiple replacements, he will be discharged on potassium supplement for next 10 days.  Chronic systolic CHF: -  Dry on admission, currently euvolemic, discharge instructions  to resume Lasix after 48 hoursBladder cancer/prostate cancer:  - Status post surgery chemo and radiation. On indwelling Foley catheter.   DEY:CXKGYJEHUD on aspirin which was recently held, unclear reason.? Secondary to falls. Mildly elevated troponin noted but similar to prior labs. Defer further work-up as he did have recent echo in March 2019. Denies any chest pain. EKG unremarkable. Will however resume baby aspirin.  Sacral decubitus:Does not appear infected. Resume prior treatment, wound care consulted  Fall: CT head/chest x-ray/knee/pelvic x-ray negative for any acute injuries. Fall precautions ordered. Will have Zyprexa available for agitation. Avoid benzodiazepines or other sedatives in this patient with high risk for falls. He ison hydroxyzine at baseline, will resume at lower dose.     Discharge Condition: stable   Follow UP  Follow-up Information    Gregor Hams, FNP. Call.   Specialty:  Family Medicine Contact information: [place] [address] [phone]             Discharge Instructions  and  Discharge Medications     Discharge Instructions    Discharge instructions   Complete by:  As directed    Follow with Primary MD Gregor Hams, FNP in 7 days   Get CBC, CMP,checked  by Primary MD next visit.    Activity: As tolerated with Full fall precautions use walker/cane & assistance as needed   Disposition ALF   Diet: Heart Healthy  , with feeding assistance and aspiration precautions.  For Heart failure patients - Check your Weight same time everyday, if you gain over 2 pounds, or you develop in leg swelling, experience more shortness of breath or chest pain, call your Primary MD immediately. Follow Cardiac Low Salt Diet and 1.5 lit/day fluid restriction.   On your next visit with your primary care physician please Get Medicines reviewed and adjusted.   Please request your Prim.MD to go over all  Hospital Tests and Procedure/Radiological results at the follow up, please get all Hospital records sent to your Prim MD by signing hospital release before you go home.   If you experience worsening of your admission symptoms, develop shortness of breath, life threatening emergency, suicidal or homicidal thoughts you must  seek medical attention immediately by calling 911 or calling your MD immediately  if symptoms less severe.  You Must read complete instructions/literature along with all the possible adverse reactions/side effects for all the Medicines you take and that have been prescribed to you. Take any new Medicines after you have completely understood and accpet all the possible adverse reactions/side effects.   Do not drive, operating heavy machinery, perform activities at heights, swimming or participation in water activities or provide baby sitting services if your were admitted for syncope or siezures until you have seen by Primary MD or a Neurologist and advised to do so again.  Do not drive when taking Pain medications.    Do not take more than prescribed Pain, Sleep and Anxiety Medications  Special Instructions: If you have smoked or chewed Tobacco  in the last 2 yrs please stop smoking, stop any regular Alcohol  and or any Recreational drug use.  Wear Seat belts while driving.   Please note  You were cared for by a hospitalist during your hospital stay. If you have any questions about your discharge medications or the care you received while you were in the hospital after you are discharged, you can call the unit and asked to speak with the hospitalist on call if the hospitalist that took care of you is not available. Once you are discharged, your primary care physician will handle any further medical issues. Please note that NO REFILLS for any discharge medications will be authorized once you are discharged, as it is imperative that you return to your primary care physician (or  establish a relationship with a primary care physician if you do not have one) for your aftercare needs so that they can reassess your need for medications and monitor your lab values.   Increase activity slowly   Complete by:  As directed      Allergies as of 05/30/2018      Reactions   Neosporin [neomycin-bacitracin Zn-polymyx] Other (See Comments)   Makes his skin red   Penicillins Hives, Itching   Has patient had a PCN reaction causing immediate rash, facial/tongue/throat swelling, SOB or lightheadedness with hypotension: Yes Has patient had a PCN reaction causing severe rash involving mucus membranes or skin necrosis: No Has patient had a PCN reaction that required hospitalization: Unknown Has patient had a PCN reaction occurring within the last 10 years: Unknown If all of the above answers are "NO", then may proceed with Cephalosporin use.   Penicillin G Rash      Medication List    STOP taking these medications   ciprofloxacin 500 MG/5ML (10%) suspension Commonly known as:  CIPRO     TAKE these medications   acetaminophen 500 MG tablet Commonly known as:  TYLENOL Take 500 mg by mouth every 6 (six) hours as needed for mild pain.   aspirin 81 MG EC tablet Take 1 tablet (81 mg total) by mouth daily. Start taking on:  05/31/2018   barrier cream Crea Commonly known as:  non-specified Apply 1 application topically 3 (three) times daily as needed (sacrum and groin redness).   cefpodoxime 100 MG tablet Commonly known as:  VANTIN Take 1 tablet (100 mg total) by mouth 2 (two) times daily. Please take for 4 days , start 05/31/2018 Start taking on:  05/31/2018   collagenase ointment Commonly known as:  SANTYL Apply topically daily. Apply Santyl to upper and lower sacrum wounds Q day, then cover with moist gauze dressing and foam dressing.  (  Change foam dressing Q 3 days or PRN soiling.)   furosemide 20 MG tablet Commonly known as:  LASIX Take 1 tablet (20 mg total) by mouth  daily. Hold for 2 days and resume on 06/02/2018 What changed:  additional instructions   hydrOXYzine 25 MG tablet Commonly known as:  ATARAX/VISTARIL Take 25 mg by mouth 3 (three) times daily.   lactobacillus acidophilus & bulgar chewable tablet Chew 1 tablet by mouth 3 (three) times daily with meals.   OXYGEN Inhale 2 L into the lungs continuous.   potassium chloride 10 MEQ tablet Commonly known as:  K-DUR Please take 20 mg oral twice daily for 5 days, then 20 mg oral daily for 5 days then stop.   prednisoLONE acetate 1 % ophthalmic suspension Commonly known as:  PRED FORTE Place 2 drops into both eyes 2 (two) times daily as needed (allergies).         Diet and Activity recommendation: See Discharge Instructions above   Consults obtained -  None   Major procedures and Radiology Reports - PLEASE review detailed and final reports for all details, in brief -      Dg Chest 2 View  Result Date: 05/28/2018 CLINICAL DATA:  History of recent fall, patient from nursing home, non communicative EXAM: CHEST - 2 VIEW COMPARISON:  Portable chest x-ray of 02/24/2018 FINDINGS: No active infiltrate or effusion is seen. Mediastinal and hilar contours are unremarkable. There is apparent persistent elevation of the right hemidiaphragm. Mild cardiomegaly is stable. There are degenerative changes throughout the thoracic spine. IMPRESSION: No active lung disease. Stable mild cardiomegaly and apparent chronic elevation of the right hemidiaphragm. Electronically Signed   By: Ivar Drape M.D.   On: 05/28/2018 11:16   Ct Head Wo Contrast  Result Date: 05/28/2018 CLINICAL DATA:  Found on floor.  Headache. EXAM: CT HEAD WITHOUT CONTRAST CT CERVICAL SPINE WITHOUT CONTRAST TECHNIQUE: Multidetector CT imaging of the head and cervical spine was performed following the standard protocol without intravenous contrast. Multiplanar CT image reconstructions of the cervical spine were also generated. COMPARISON:   04/22/2018 FINDINGS: CT HEAD FINDINGS Brain: There is atrophy and chronic small vessel disease changes. No acute intracranial abnormality. Specifically, no hemorrhage, hydrocephalus, mass lesion, acute infarction, or significant intracranial injury. Vascular: No hyperdense vessel or unexpected calcification. Skull: No acute calvarial abnormality. Sinuses/Orbits: Opacified frontal sinuses, ethmoid air cells and right maxillary sinus. Mucosal thickening in the left maxillary sinus. Opacified left mastoid air cells. These findings are stable since prior study. Other: None CT CERVICAL SPINE FINDINGS Alignment: No subluxation. Skull base and vertebrae: No acute fracture. No primary bone lesion or focal pathologic process. Soft tissues and spinal canal: No prevertebral fluid or swelling. No visible canal hematoma. Disc levels: Diffuse degenerative disc disease and facet disease, moderate to advanced. Upper chest: Diffuse aortic arch and great vessel calcifications. No acute findings. Other: Carotid artery calcifications.  No acute findings. IMPRESSION: No acute intracranial abnormality. Atrophy, chronic microvascular disease. Pain and chronic sinusitis. Opacified left mastoid air cells, stable. Diffuse degenerative disc and facet disease in the cervical spine. No acute bony abnormality. Electronically Signed   By: Rolm Baptise M.D.   On: 05/28/2018 10:29   Ct Cervical Spine Wo Contrast  Result Date: 05/28/2018 CLINICAL DATA:  Found on floor.  Headache. EXAM: CT HEAD WITHOUT CONTRAST CT CERVICAL SPINE WITHOUT CONTRAST TECHNIQUE: Multidetector CT imaging of the head and cervical spine was performed following the standard protocol without intravenous contrast. Multiplanar CT image reconstructions  of the cervical spine were also generated. COMPARISON:  04/22/2018 FINDINGS: CT HEAD FINDINGS Brain: There is atrophy and chronic small vessel disease changes. No acute intracranial abnormality. Specifically, no hemorrhage,  hydrocephalus, mass lesion, acute infarction, or significant intracranial injury. Vascular: No hyperdense vessel or unexpected calcification. Skull: No acute calvarial abnormality. Sinuses/Orbits: Opacified frontal sinuses, ethmoid air cells and right maxillary sinus. Mucosal thickening in the left maxillary sinus. Opacified left mastoid air cells. These findings are stable since prior study. Other: None CT CERVICAL SPINE FINDINGS Alignment: No subluxation. Skull base and vertebrae: No acute fracture. No primary bone lesion or focal pathologic process. Soft tissues and spinal canal: No prevertebral fluid or swelling. No visible canal hematoma. Disc levels: Diffuse degenerative disc disease and facet disease, moderate to advanced. Upper chest: Diffuse aortic arch and great vessel calcifications. No acute findings. Other: Carotid artery calcifications.  No acute findings. IMPRESSION: No acute intracranial abnormality. Atrophy, chronic microvascular disease. Pain and chronic sinusitis. Opacified left mastoid air cells, stable. Diffuse degenerative disc and facet disease in the cervical spine. No acute bony abnormality. Electronically Signed   By: Rolm Baptise M.D.   On: 05/28/2018 10:29   Dg Knee Complete 4 Views Left  Result Date: 05/28/2018 CLINICAL DATA:  Fall, pain EXAM: LEFT KNEE - COMPLETE 4+ VIEW COMPARISON:  None. FINDINGS: No acute bony abnormality. Specifically, no fracture, subluxation, or dislocation. No joint effusion. Joint spaces maintained. Vascular calcifications noted. IMPRESSION: No acute bony abnormality. Electronically Signed   By: Rolm Baptise M.D.   On: 05/28/2018 07:00   Dg Hip Unilat W Or Wo Pelvis 2-3 Views Right  Result Date: 05/28/2018 CLINICAL DATA:  Fall EXAM: DG HIP (WITH OR WITHOUT PELVIS) 2-3V RIGHT COMPARISON:  None. FINDINGS: Degenerative changes in the hips bilaterally with joint space narrowing and spurring. SI joints are symmetric and unremarkable. Radiation seeds in the  pelvis. No acute bony abnormality. Specifically, no fracture, subluxation, or dislocation. IMPRESSION: No acute bony abnormality. Mild degenerative changes in the hips bilaterally. Electronically Signed   By: Rolm Baptise M.D.   On: 05/28/2018 07:01    Micro Results     Recent Results (from the past 240 hour(s))  Culture, blood (routine x 2)     Status: None (Preliminary result)   Collection Time: 05/28/18 11:30 AM  Result Value Ref Range Status   Specimen Description BLOOD RIGHT ANTECUBITAL  Final   Special Requests   Final    BOTTLES DRAWN AEROBIC AND ANAEROBIC Blood Culture adequate volume   Culture   Final    NO GROWTH 2 DAYS Performed at [facility], [address], [place], [place] [address]    Report Status PENDING  Incomplete  Culture, blood (routine x 2)     Status: None (Preliminary result)   Collection Time: 05/28/18 11:41 AM  Result Value Ref Range Status   Specimen Description BLOOD RIGHT FOREARM  Final   Special Requests   Final    BOTTLES DRAWN AEROBIC AND ANAEROBIC Blood Culture adequate volume   Culture   Final    NO GROWTH 2 DAYS Performed at [facility], [place] [address] [place], [place] [address]    Report Status PENDING  Incomplete  Urine culture     Status: Abnormal   Collection Time: 05/28/18 12:35 PM  Result Value Ref Range Status   Specimen Description URINE, CLEAN CATCH  Final   Special Requests   Final    NONE Performed at [facility], [address], [place], [place] [address]    Culture MULTIPLE SPECIES PRESENT, SUGGEST RECOLLECTION (A)  Final   Report Status 05/29/2018 FINAL  Final       Today   Subjective:   Kross Franzen today denies any complaints.  Objective:   Blood pressure 112/65, pulse 80, temperature 98.2 F (36.8 C), temperature source Oral, resp. rate 20, height _0  (1.676 m), weight 73 kg, SpO2 93 %.   Intake/Output Summary (Last 24 hours) at 05/30/2018 1551 Last data filed at 05/30/2018  1500 Gross per 24 hour  Intake 2543.47 ml  Output 2000 ml  Net 543.47 ml    Exam   Awake Alert, Oriented X 2, pleasant, mildly confused, hard of hearing Symmetrical Chest wall movement, Good air movement bilaterally, CTAB RRR,No Gallops,Rubs or new Murmurs, No Parasternal Heave +ve B.Sounds, Abd Soft, No tenderness,  No rebound - guarding or rigidity. No Cyanosis, Clubbing or edema, No new Rash or bruise  Stage III sacral pressure ulcer  Data Review   CBC w Diff:  Lab Results  Component Value Date   WBC 12.2 (H) 05/29/2018   HGB 12.7 (L) 05/29/2018   HCT 39.6 05/29/2018   PLT 303 05/29/2018   LYMPHOPCT 20 05/28/2018   MONOPCT 11 05/28/2018   EOSPCT 2 05/28/2018   BASOPCT 1 05/28/2018    CMP:  Lab Results  Component Value Date   NA 136 05/30/2018   NA 139 05/26/2017   K 3.6 05/30/2018   CL 95 (L) 05/30/2018   CO2 32 05/30/2018   BUN <5 (L) 05/30/2018   BUN 14 05/26/2017   CREATININE 0.85 05/30/2018   PROT 5.4 (L) 05/29/2018   PROT 6.6 05/26/2017   ALBUMIN 2.5 (L) 05/29/2018   ALBUMIN 4.5 05/26/2017   BILITOT 1.0 05/29/2018   BILITOT 0.8 05/26/2017   ALKPHOS 94 05/29/2018   AST 18 05/29/2018   ALT 10 05/29/2018  .   Total Time in preparing paper work, data evaluation and todays exam - 35 minutes 2 Phillips Climes M.D on 05/30/2018 at 3:51 PM  [facility]   Office  [phone]

## 2018-05-30 NOTE — Progress Notes (Signed)
Report given to [place].

## 2018-05-30 NOTE — Progress Notes (Signed)
CRITICAL VALUE ALERT  Critical Value:  K 2.7  Date & Time Notied:  05/30/2018 and 0751  Provider Notified: [place]  Orders Received/Actions taken: Potassium runs x 4 and potassium tablets, a redraw of K+ will be at 1 pm.

## 2018-05-30 NOTE — NC FL2 (Signed)
[place] MEDICAID FL2 LEVEL OF CARE SCREENING TOOL     IDENTIFICATION  Patient Name: [name] Birthdate: 06-Dec-1926 Sex: male Admission Date (Current Location): 05/28/2018  [facility] and [place] Number:  Herbalist and Address:  [facility], [place] [address], [place], [place] [address]      Provider Number: [id number]  Attending Physician Name and Address:  Albertine Patricia, MD  Relative Name and Phone Number:  Liliane Channel (son) [phone], Pamala Hurry [phone]    Current Level of Care: Hospital Recommended Level of Care: [place] Prior Approval Number:    Date Approved/Denied:   PASRR Number:    Discharge Plan: Other (Comment)(ALF)    Current Diagnoses: Patient Active Problem List   Diagnosis Date Noted  . Pressure injury of skin 04/23/2018  . Chronic respiratory failure with hypoxia ([place]) 04/23/2018  . Sacral pressure ulcer 04/23/2018  . UTI (urinary tract infection), bacterial/CAUTI/Pseudomonas 04/22/2018  . Metabolic encephalopathy 84/69/6295  . UTI (urinary tract infection) 04/22/2018  . E. coli UTI (urinary tract infection) 03/10/2018  . Acute metabolic encephalopathy 28/41/3244  . Acute kidney injury ([place]) 03/10/2018  . Chronic systolic (congestive) heart failure ([place]) 03/10/2018  . Pneumonia 02/24/2018  . Pleural effusion on right 12/05/2017  . Acute respiratory failure with hypoxia ([place]) 12/05/2017  . Memory difficulty 05/26/2017  . Gait abnormality 05/26/2017  . Constipation 01/03/2017  . Atrial fibrillation ([place]) 01/03/2017  . Hyponatremia 01/03/2017  . First degree AV block 06/28/2013  . Bladder cancer ([place]) 07/01/2012  . Cataracts, bilateral 03/14/2011  . Ventricular bigeminy   . Nonsustained ventricular tachycardia ([place])   . Nonobstructive CAD (coronary artery disease)   . Hypertension   . Hyperlipidemia     Orientation RESPIRATION BLADDER Height & Weight     Self    Incontinent,  Indwelling catheter Weight: 160 lb 15 oz (73 kg) Height:  5\' 6"  (167.6 cm)  BEHAVIORAL SYMPTOMS/MOOD NEUROLOGICAL BOWEL NUTRITION STATUS  Other (Comment)(poor concentration)   Continent    AMBULATORY STATUS COMMUNICATION OF NEEDS Skin   Extensive Assist Verbally Skin abrasions, PU Stage and Appropriate Care(abrasion left knee, skin tear right elbow, pressure injury sacrum)                       Personal Care Assistance Level of Assistance  Bathing, Feeding, Dressing, Total care Bathing Assistance: Maximum assistance Feeding assistance: Maximum assistance Dressing Assistance: Maximum assistance Total Care Assistance: Maximum assistance   Functional Limitations Info  Sight, Hearing, Speech Sight Info: Adequate Hearing Info: Adequate Speech Info: Impaired    SPECIAL CARE FACTORS FREQUENCY  PT (By licensed PT), OT (By licensed OT)     PT Frequency: 5x weekly OT Frequency: 3x weekly            Contractures Contractures Info: Not present    Additional Factors Info  Code Status, Allergies, Isolation Precautions Code Status Info: Partial Allergies Info: Allergies:  Neosporin Neomycin-bacitracin Zn-polymyx, Penicillins, Penicillin G     Isolation Precautions Info: MRSA + by PCR     Current Medications (05/30/2018):  TAKE these medications   acetaminophen 500 MG tablet Commonly known as:  TYLENOL Take 500 mg by mouth every 6 (six) hours as needed for mild pain.   aspirin 81 MG EC tablet Take 1 tablet (81 mg total) by mouth daily. Start taking on:  05/31/2018   barrier cream Crea Commonly known as:  non-specified Apply 1 application topically 3 (three) times daily  as needed (sacrum and groin redness).   cefpodoxime 100 MG tablet Commonly known as:  VANTIN Take 1 tablet (100 mg total) by mouth 2 (two) times daily. Please take for 4 days , start 05/31/2018 Start taking on:  05/31/2018   collagenase ointment Commonly known as:  SANTYL Apply topically daily.  Apply Santyl to upper and lower sacrum wounds Q day, then cover with moist gauze dressing and foam dressing.  (Change foam dressing Q 3 days or PRN soiling.)   furosemide 20 MG tablet Commonly known as:  LASIX Take 1 tablet (20 mg total) by mouth daily. Hold for 2 days and resume on 06/02/2018 What changed:  additional instructions   hydrOXYzine 25 MG tablet Commonly known as:  ATARAX/VISTARIL Take 25 mg by mouth 3 (three) times daily.   lactobacillus acidophilus & bulgar chewable tablet Chew 1 tablet by mouth 3 (three) times daily with meals.   OXYGEN Inhale 2 L into the lungs continuous.   potassium chloride 10 MEQ tablet Commonly known as:  K-DUR Please take 20 mg oral twice daily for 5 days, then 20 mg oral daily for 5 days then stop.   prednisoLONE acetate 1 % ophthalmic suspension Commonly known as:  PRED FORTE Place 2 drops into both eyes 2 (two) times daily as needed (allergies).       Discharge Medications: Please see discharge summary for a list of discharge medications.  Relevant Imaging Results:  Relevant Lab Results:   Additional Information SSN: [id number]  Carolin Sicks, [place]

## 2018-06-02 LAB — CULTURE, BLOOD (ROUTINE X 2)
Culture: NO GROWTH
Culture: NO GROWTH
SPECIAL REQUESTS: ADEQUATE
Special Requests: ADEQUATE

## 2018-07-05 ENCOUNTER — Observation Stay (HOSPITAL_COMMUNITY)
Admission: EM | Admit: 2018-07-05 | Discharge: 2018-07-11 | Disposition: A | Discharge status: Skilled Nursing Facility | Payer: Medicare Other | Attending: Internal Medicine | Admitting: Internal Medicine

## 2018-07-05 ENCOUNTER — Emergency Department (HOSPITAL_COMMUNITY): Payer: Medicare Other

## 2018-07-05 ENCOUNTER — Other Ambulatory Visit: Payer: Self-pay

## 2018-07-05 ENCOUNTER — Encounter (HOSPITAL_COMMUNITY): Payer: Self-pay | Admitting: Emergency Medicine

## 2018-07-05 ENCOUNTER — Observation Stay (HOSPITAL_COMMUNITY): Payer: Medicare Other

## 2018-07-05 DIAGNOSIS — I480 Paroxysmal atrial fibrillation: Secondary | ICD-10-CM | POA: Diagnosis not present

## 2018-07-05 DIAGNOSIS — E876 Hypokalemia: Secondary | ICD-10-CM | POA: Diagnosis present

## 2018-07-05 DIAGNOSIS — Z8551 Personal history of malignant neoplasm of bladder: Secondary | ICD-10-CM | POA: Diagnosis not present

## 2018-07-05 DIAGNOSIS — Z880 Allergy status to penicillin: Secondary | ICD-10-CM | POA: Diagnosis not present

## 2018-07-05 DIAGNOSIS — T83511A Infection and inflammatory reaction due to indwelling urethral catheter, initial encounter: Secondary | ICD-10-CM

## 2018-07-05 DIAGNOSIS — R296 Repeated falls: Principal | ICD-10-CM | POA: Insufficient documentation

## 2018-07-05 DIAGNOSIS — W19XXXA Unspecified fall, initial encounter: Secondary | ICD-10-CM

## 2018-07-05 DIAGNOSIS — C679 Malignant neoplasm of bladder, unspecified: Secondary | ICD-10-CM | POA: Diagnosis present

## 2018-07-05 DIAGNOSIS — Z881 Allergy status to other antibiotic agents status: Secondary | ICD-10-CM | POA: Diagnosis not present

## 2018-07-05 DIAGNOSIS — I5022 Chronic systolic (congestive) heart failure: Secondary | ICD-10-CM | POA: Diagnosis not present

## 2018-07-05 DIAGNOSIS — N390 Urinary tract infection, site not specified: Secondary | ICD-10-CM | POA: Diagnosis present

## 2018-07-05 DIAGNOSIS — D72829 Elevated white blood cell count, unspecified: Secondary | ICD-10-CM | POA: Diagnosis present

## 2018-07-05 DIAGNOSIS — N183 Chronic kidney disease, stage 3 (moderate): Secondary | ICD-10-CM | POA: Diagnosis not present

## 2018-07-05 DIAGNOSIS — F17220 Nicotine dependence, chewing tobacco, uncomplicated: Secondary | ICD-10-CM | POA: Insufficient documentation

## 2018-07-05 DIAGNOSIS — Z8546 Personal history of malignant neoplasm of prostate: Secondary | ICD-10-CM | POA: Insufficient documentation

## 2018-07-05 DIAGNOSIS — F0390 Unspecified dementia without behavioral disturbance: Secondary | ICD-10-CM | POA: Diagnosis not present

## 2018-07-05 DIAGNOSIS — L89154 Pressure ulcer of sacral region, stage 4: Secondary | ICD-10-CM | POA: Diagnosis not present

## 2018-07-05 DIAGNOSIS — L03317 Cellulitis of buttock: Secondary | ICD-10-CM | POA: Diagnosis present

## 2018-07-05 DIAGNOSIS — Z79899 Other long term (current) drug therapy: Secondary | ICD-10-CM | POA: Insufficient documentation

## 2018-07-05 DIAGNOSIS — Z7982 Long term (current) use of aspirin: Secondary | ICD-10-CM | POA: Diagnosis not present

## 2018-07-05 DIAGNOSIS — Z85828 Personal history of other malignant neoplasm of skin: Secondary | ICD-10-CM | POA: Diagnosis not present

## 2018-07-05 DIAGNOSIS — I2510 Atherosclerotic heart disease of native coronary artery without angina pectoris: Secondary | ICD-10-CM | POA: Diagnosis not present

## 2018-07-05 DIAGNOSIS — G4730 Sleep apnea, unspecified: Secondary | ICD-10-CM | POA: Diagnosis not present

## 2018-07-05 DIAGNOSIS — E871 Hypo-osmolality and hyponatremia: Secondary | ICD-10-CM | POA: Diagnosis present

## 2018-07-05 DIAGNOSIS — Z87440 Personal history of urinary (tract) infections: Secondary | ICD-10-CM | POA: Insufficient documentation

## 2018-07-05 DIAGNOSIS — I482 Chronic atrial fibrillation, unspecified: Secondary | ICD-10-CM | POA: Insufficient documentation

## 2018-07-05 DIAGNOSIS — J9611 Chronic respiratory failure with hypoxia: Secondary | ICD-10-CM | POA: Diagnosis present

## 2018-07-05 DIAGNOSIS — I10 Essential (primary) hypertension: Secondary | ICD-10-CM | POA: Diagnosis present

## 2018-07-05 DIAGNOSIS — R413 Other amnesia: Secondary | ICD-10-CM | POA: Diagnosis present

## 2018-07-05 DIAGNOSIS — M542 Cervicalgia: Secondary | ICD-10-CM | POA: Diagnosis not present

## 2018-07-05 DIAGNOSIS — I130 Hypertensive heart and chronic kidney disease with heart failure and stage 1 through stage 4 chronic kidney disease, or unspecified chronic kidney disease: Secondary | ICD-10-CM | POA: Diagnosis not present

## 2018-07-05 DIAGNOSIS — I4891 Unspecified atrial fibrillation: Secondary | ICD-10-CM | POA: Diagnosis present

## 2018-07-05 LAB — URINALYSIS, ROUTINE W REFLEX MICROSCOPIC
Bilirubin Urine: NEGATIVE
Glucose, UA: NEGATIVE mg/dL
Ketones, ur: NEGATIVE mg/dL
Nitrite: NEGATIVE
PH: 5 (ref 5.0–8.0)
Protein, ur: 100 mg/dL — AB
SPECIFIC GRAVITY, URINE: 1.016 (ref 1.005–1.030)
WBC, UA: >50 WBC/hpf — ABNORMAL HIGH (ref 0–5)

## 2018-07-05 LAB — CBC WITH DIFFERENTIAL/PLATELET
ABS IMMATURE GRANULOCYTES: 0.3 10*3/uL — AB (ref 0.00–0.07)
Basophils Absolute: 0.1 10*3/uL (ref 0.0–0.1)
Basophils Relative: 1 %
EOS ABS: 0.2 10*3/uL (ref 0.0–0.5)
Eosinophils Relative: 1 %
HEMATOCRIT: 40.6 % (ref 39.0–52.0)
Hemoglobin: 12.7 g/dL — ABNORMAL LOW (ref 13.0–17.0)
IMMATURE GRANULOCYTES: 2 %
LYMPHS ABS: 3.2 10*3/uL (ref 0.7–4.0)
Lymphocytes Relative: 17 %
MCH: 26 pg (ref 26.0–34.0)
MCHC: 31.3 g/dL (ref 30.0–36.0)
MCV: 83 fL (ref 80.0–100.0)
MONO ABS: 1.3 10*3/uL — AB (ref 0.1–1.0)
MONOS PCT: 7 %
Neutro Abs: 13.8 10*3/uL — ABNORMAL HIGH (ref 1.7–7.7)
Neutrophils Relative %: 72 %
Platelets: 467 10*3/uL — ABNORMAL HIGH (ref 150–400)
RBC: 4.89 MIL/uL (ref 4.22–5.81)
RDW: 13.3 % (ref 11.5–15.5)
WBC: 19 10*3/uL — ABNORMAL HIGH (ref 4.0–10.5)
nRBC: 0 % (ref 0.0–0.2)

## 2018-07-05 LAB — BASIC METABOLIC PANEL
Anion gap: 13 (ref 5–15)
BUN: 13 mg/dL (ref 8–23)
CHLORIDE: 85 mmol/L — AB (ref 98–111)
CO2: 33 mmol/L — AB (ref 22–32)
CREATININE: 1.06 mg/dL (ref 0.61–1.24)
Calcium: 8.7 mg/dL — ABNORMAL LOW (ref 8.9–10.3)
GFR calc Af Amer: >60 mL/min (ref >60)
GFR calc non Af Amer: >60 mL/min (ref >60)
Glucose, Bld: 134 mg/dL — ABNORMAL HIGH (ref 70–99)
Potassium: 2.5 mmol/L — CL (ref 3.5–5.1)
Sodium: 131 mmol/L — ABNORMAL LOW (ref 135–145)

## 2018-07-05 LAB — MAGNESIUM: Magnesium: 2.1 mg/dL (ref 1.7–2.4)

## 2018-07-05 MED ORDER — SODIUM CHLORIDE 0.9 % IV SOLN
2.0000 g | Freq: Once | INTRAVENOUS | Status: AC
  Administered 2018-07-06: 2 g via INTRAVENOUS
  Filled 2018-07-05: qty 2

## 2018-07-05 MED ORDER — VANCOMYCIN HCL IN DEXTROSE 750-5 MG/150ML-% IV SOLN
750.0000 mg | Freq: Two times a day (BID) | INTRAVENOUS | Status: DC
  Administered 2018-07-06 – 2018-07-07 (×3): 750 mg via INTRAVENOUS
  Filled 2018-07-05 (×3): qty 150

## 2018-07-05 MED ORDER — MORPHINE SULFATE (PF) 2 MG/ML IV SOLN
2.0000 mg | INTRAVENOUS | Status: DC | PRN
  Administered 2018-07-07 – 2018-07-09 (×2): 2 mg via INTRAVENOUS
  Filled 2018-07-05 (×3): qty 1

## 2018-07-05 MED ORDER — ACETAMINOPHEN 650 MG RE SUPP: 650.0000 mg | Freq: Four times a day (QID) | RECTAL | Status: DC | PRN

## 2018-07-05 MED ORDER — POTASSIUM CHLORIDE 20 MEQ PO PACK
40.0000 meq | PACK | Freq: Once | ORAL | Status: AC
  Administered 2018-07-05: 40 meq via ORAL
  Filled 2018-07-05: qty 2

## 2018-07-05 MED ORDER — VANCOMYCIN HCL IN DEXTROSE 1-5 GM/200ML-% IV SOLN
1000.0000 mg | Freq: Once | INTRAVENOUS | Status: AC
  Administered 2018-07-06: 1000 mg via INTRAVENOUS
  Filled 2018-07-05: qty 200

## 2018-07-05 MED ORDER — POTASSIUM CHLORIDE 10 MEQ/100ML IV SOLN
10.0000 meq | INTRAVENOUS | Status: AC
  Administered 2018-07-05 – 2018-07-06 (×4): 10 meq via INTRAVENOUS
  Filled 2018-07-05 (×4): qty 100

## 2018-07-05 MED ORDER — ENOXAPARIN SODIUM 40 MG/0.4ML ~~LOC~~ SOLN
40.0000 mg | SUBCUTANEOUS | Status: DC
  Administered 2018-07-06 – 2018-07-10 (×5): 40 mg via SUBCUTANEOUS
  Filled 2018-07-05 (×5): qty 0.4

## 2018-07-05 MED ORDER — ONDANSETRON HCL 4 MG/2ML IJ SOLN: 4.0000 mg | Freq: Four times a day (QID) | INTRAMUSCULAR | Status: DC | PRN

## 2018-07-05 MED ORDER — SODIUM CHLORIDE 0.9 % IV SOLN
2.0000 g | Freq: Two times a day (BID) | INTRAVENOUS | Status: DC
  Administered 2018-07-06: 2 g via INTRAVENOUS
  Filled 2018-07-05: qty 2

## 2018-07-05 MED ORDER — ACETAMINOPHEN 325 MG PO TABS
650.0000 mg | ORAL_TABLET | Freq: Four times a day (QID) | ORAL | Status: DC | PRN
  Administered 2018-07-06 – 2018-07-07 (×2): 650 mg via ORAL
  Filled 2018-07-05 (×3): qty 2

## 2018-07-05 MED ORDER — ONDANSETRON HCL 4 MG PO TABS: 4.0000 mg | ORAL_TABLET | Freq: Four times a day (QID) | ORAL | Status: DC | PRN

## 2018-07-05 NOTE — ED Notes (Signed)
MD Gardner at bedside. 

## 2018-07-05 NOTE — ED Notes (Signed)
Report attempted x 1

## 2018-07-05 NOTE — ED Provider Notes (Signed)
[place] EMERGENCY DEPARTMENT Provider Note   CSN: [id number] Arrival date & time: 07/05/18  1731     History   Chief Complaint Chief Complaint  Patient presents with  . Fall    HPI [name] is a 84 y.o. male.  HPI  Patient is an 84 year old male with PMHx of HTN, CKD, dementia, prostate and bladder cancer s/p chronic indwelling Foley catheter, CAD, HFrEF (EF 35-40%), recurrent falls and recurrent UTI who presents with a unwitnessed fall at [facility].  Per EMS report, patient received morphine 2 hours prior to fall for decubitus ulcer pain.  He was then found on the floor.  Patient c/o buttock pain and left sided neck pain.  No other complaints.  Unclear if LOC.  No anticoagulation use.  He is HDS on arrival. Alert and oriented to self and place not time.  Per chart review patient was recently admitted on 05/28/2018 for fall found to have a foley associated UTI, AKI (baseline Cr 0.8), and hypokalemia.  He was given IV Rocephin given prior urine cultures she was transitioned to oral Ceftin.  Level 5 caveat 2/2 patient's dementia  Past Medical History:  Diagnosis Date  . A-fib ([place])   . AKI (acute kidney injury) ([place])   . Arthritis   . Benign hypertension with chronic kidney disease, stage III ([place]) 11/28/2015  . Bladder cancer ([place])   . CAD (coronary artery disease) 05/31/2016   cath 02/28/11: dLM 10%, pLAD 20%, pRCA calcified nodule 50% (FFR 0.86 - not significant), mRCA 40-50%, mildly elevated filling pressures, EF 50-55%  . Cataract   . Chronic kidney disease   . CKD (chronic kidney disease) stage 3, GFR 30-59 ml/min (HCC)   . CKD (chronic kidney disease), stage III ([place]) 11/28/2015   follows up with Dr. Clydell Hakim Last creatinine 1.36, EGFR 50  . Dyslipidemia   . First degree AV block 06/28/2013  . Gait abnormality 05/26/2017  . Glaucoma   . Hard of hearing   . Hyperlipidemia 11/28/2015   Last Assessment & Plan: Lipids were at goal a year  ago; continue statin  . Hypertension    LOV with EKG 06/11/11 Dr Caryl Comes  [facility]  . Malignant neoplasm of prostate [facility])    surgery and radiation  . Malignant neoplasm of urinary bladder ([place]) 07/01/2012   surgery and chemo  . Memory difficulty 05/26/2017  . NSVT (nonsustained ventricular tachycardia) ([place]) 11/28/2015   Overview: Overview: Overview: echo 6/12: EF 55%, basal inferolat AK, mild LVH Last Assessment & Plan: resolved Overview: echo 6/12: EF 55%, basal inferolat AK, mild LVH Last Assessment & Plan: resolved  . Paroxysmal ventricular tachycardia ([place]) 05/31/2016   Overview: Overview: echo 6/12: EF 55%, basal inferolat AK, mild LVH Last Assessment & Plan: resolved  . Prostate cancer ([place])    status post brachytherapy---in 2003  . PVC (premature ventricular contraction)   . Sleep apnea    STOP BANG SCORE 4  . Ventricular bigeminy 05/31/2016   Overview: Overview: rhythmol Date PR QRS 6/12 23 088 10/13 24 110 10/14 31 109 Last Assessment & Plan: The patient is without symptomatic oral echocardiographic ectopy. His exercise tolerance is quite good. I am concerned about the interval prolongation of his PR interval. We will follow this closely. It is potentially related to the presence of his 1C antiarrhythmic and we may need to discontinue  . Ventricular tachycardia, nonsustained/bigeminy    echo 6/12: EF 55%, basal inferolat AK, mild LVH  Patient Active Problem List   Diagnosis Date Noted  . Leukocytosis 07/05/2018  . Hypokalemia 07/05/2018  . Cellulitis of buttock 07/05/2018  . Pressure injury of skin 04/23/2018  . Chronic respiratory failure with hypoxia ([place]) 04/23/2018  . Decubitus ulcer of sacral region, stage 4 ([place]) 04/23/2018  . UTI (urinary tract infection), bacterial/CAUTI/Pseudomonas 04/22/2018  . Metabolic encephalopathy 63/33/5456  . UTI (urinary tract infection) 04/22/2018  . E. coli UTI (urinary tract infection) 03/10/2018  . Acute metabolic encephalopathy  25/63/8937  . Acute kidney injury ([place]) 03/10/2018  . Chronic systolic (congestive) heart failure ([place]) 03/10/2018  . Pneumonia 02/24/2018  . Pleural effusion on right 12/05/2017  . Acute respiratory failure with hypoxia ([place]) 12/05/2017  . Memory difficulty 05/26/2017  . Gait abnormality 05/26/2017  . Constipation 01/03/2017  . Atrial fibrillation ([place]) 01/03/2017  . Hyponatremia 01/03/2017  . First degree AV block 06/28/2013  . Bladder cancer ([place]) 07/01/2012  . Cataracts, bilateral 03/14/2011  . Ventricular bigeminy   . Nonsustained ventricular tachycardia ([place])   . Nonobstructive CAD (coronary artery disease)   . Hypertension   . Hyperlipidemia     Past Surgical History:  Procedure Laterality Date  . APPENDECTOMY  1951  . BLADDER SURGERY  2013   followed by chemo  . CARDIAC CATHETERIZATION  6/12  . CATARACT EXTRACTION W/ INTRAOCULAR LENS IMPLANT Right 2016   Right eye [facility] Dr. Lennox Pippins  . CATARACT EXTRACTION W/ INTRAOCULAR LENS IMPLANT Left 2014  . CORONARY ANGIOPLASTY     no significant blockage requiring stenting per patient  . CYSTOSCOPY  05/20/2012   Procedure: CYSTOSCOPY;  Surgeon: Alexis Frock, MD;  Location: [facility]S;  Service: Urology;  Laterality: N/A;  . CYSTOSCOPY  07/01/2012   Procedure: CYSTOSCOPY;  Surgeon: Molli Hazard, MD;  Location: [facility];  Service: Urology;  Laterality: N/A;  . DESCEMETS STRIPPING AUTOMATED ENDOTHELIAL KERATOPLASTY Right 06/13/2016   Procedure: DESCEMETS STRIPPING ENDOTHELIAL KERATOPLASTY; Surgeon: Marilynne Halsted, MD; Location: [facility] MAIN OR; Service: Ophthalmology; Laterality: Right;  . DESCEMETS STRIPPING AUTOMATED ENDOTHELIAL KERATOPLASTY Right 11/14/2016   Procedure: DESCEMETS STRIPPING ENDOTHELIAL KERATOPLASTY; Surgeon: Marilynne Halsted, MD; Location: [facility] MAIN OR; Service: Ophthalmology; Laterality: Right;  . EYE SURGERY     left cataract extraction with IOL  . PROSTATE SURGERY  2003   followed by radiation  .  SKIN BIOPSY     BCC excised   . SKIN CANCER EXCISION     x 4  . TRANSURETHRAL RESECTION OF BLADDER TUMOR  05/20/2012   Procedure: TRANSURETHRAL RESECTION OF BLADDER TUMOR (TURBT);  Surgeon: Alexis Frock, MD;  Location: [facility];  Service: Urology;  Laterality: N/A;  [place]   . TRANSURETHRAL RESECTION OF BLADDER TUMOR  07/01/2012   Procedure: TRANSURETHRAL RESECTION OF BLADDER TUMOR (TURBT);  Surgeon: Molli Hazard, MD;  Location: [facility];  Service: Urology;  Laterality: N/A;  Cystoscopy, TURBT, be prepared for Open Cystorraphy            Home Medications    Prior to Admission medications   Medication Sig Start Date End Date Taking? Authorizing Provider  acetaminophen (TYLENOL) 500 MG tablet Take 500 mg by mouth every 6 (six) hours as needed for mild pain.   Yes [provider]  aspirin EC 81 MG EC tablet Take 1 tablet (81 mg total) by mouth daily. 05/31/18   Elgergawy, Silver Huguenin, MD  barrier cream (NON-SPECIFIED) CREA Apply 1 application topically 3 (three) times daily as needed (sacrum and groin redness).    [provider]  collagenase (SANTYL) ointment Apply topically daily. Apply Santyl to upper and lower sacrum wounds Q day, then cover with moist gauze dressing and foam dressing.  (Change foam dressing Q 3 days or PRN soiling.) Patient not taking: Reported on 05/28/2018 04/25/18   Samuella Cota, MD  furosemide (LASIX) 20 MG tablet Take 1 tablet (20 mg total) by mouth daily. Hold for 2 days and resume on 06/02/2018 05/30/18   Elgergawy, Silver Huguenin, MD  hydrOXYzine (ATARAX/VISTARIL) 25 MG tablet Take 25 mg by mouth 3 (three) times daily.    [provider]  lactobacillus acidophilus & bulgar (LACTINEX) chewable tablet Chew 1 tablet by mouth 3 (three) times daily with meals. 05/30/18   Elgergawy, Silver Huguenin, MD  OXYGEN Inhale 2 L into the lungs continuous.    [provider]  potassium chloride (K-DUR) 10 MEQ tablet Please take 20 mg oral twice daily  for 5 days, then 20 mg oral daily for 5 days then stop. 05/30/18   Elgergawy, Silver Huguenin, MD  prednisoLONE acetate (PRED FORTE) 1 % ophthalmic suspension Place 2 drops into both eyes 2 (two) times daily as needed (allergies).     [provider]    Family History Family History  Problem Relation Age of Onset  . Hypertension Mother   . Arthritis Son   . Heart Problems Son        Aortic valve replacement  . Lung cancer Daughter   . Heart attack Neg Hx   . Stroke Neg Hx     Social History Social History   Tobacco Use  . Smoking status: Former Smoker    Types: Cigars    Last attempt to quit: 05/20/1995    Years since quitting: 23.1  . Smokeless tobacco: Current User    Types: Chew  Substance Use Topics  . Alcohol use: Not Currently  . Drug use: No     Allergies   Neosporin [neomycin-bacitracin zn-polymyx]; Penicillins; and Penicillin g   Review of Systems Review of Systems  Unable to perform ROS: Dementia     Physical Exam Updated Vital Signs BP (!) 124/92   Pulse 92   Temp (!) 97.4 F (36.3 C) (Axillary)   Resp 17   Ht 5' 9"  (1.753 m)   Wt 73 kg   SpO2 100%   BMI 23.77 kg/m   Physical Exam  Constitutional: No distress.  Elderly cachetic appearing male.  HENT:  Head: Normocephalic and atraumatic.  Mouth/Throat: Oropharynx is clear and moist.  Eyes: Pupils are equal, round, and reactive to light. Conjunctivae and EOM are normal.  41m pupils bilaterally  Neck: Normal range of motion. Neck supple.  No midline cervical TTP.  Cardiovascular: Normal rate, regular rhythm and intact distal pulses.  No murmur heard. Pulmonary/Chest: Effort normal and breath sounds normal. No respiratory distress.  Abdominal: Soft. He exhibits no distension. There is no tenderness. There is no guarding.  Genitourinary:  Genitourinary Comments: Chronic indwelling foley in place.  No gross purulent discharge or overlying erythema.  Musculoskeletal: He exhibits no edema.    Able to move all 4 extremities spontaneously.  No bony TTP.  NVI throughout with 2+ radial and DP pulses.  Neurological: He is alert.  Oriented to self and place not time.  Follows commands.  Appropriate.  No midline spinal TTP, stepoffs or deformities.  Skin: Skin is warm and dry. Capillary refill takes less than 2 seconds.  Large sacral stage 4 decubitus ulcer with associated erythema.  No purulent discharge.  Significant TTP.  Left knee small skin tear otherwise no obvious lacerations or abrasions.    Psychiatric: He has a normal mood and affect.  Nursing note and vitals reviewed.    ED Treatments / Results  Labs (all labs ordered are listed, but only abnormal results are displayed) Labs Reviewed  CBC WITH DIFFERENTIAL/PLATELET - Abnormal; Notable for the following components:      Result Value   WBC 19.0 (*)    Hemoglobin 12.7 (*)    Platelets 467 (*)    Neutro Abs 13.8 (*)    Monocytes Absolute 1.3 (*)    Abs Immature Granulocytes 0.30 (*)    All other components within normal limits  BASIC METABOLIC PANEL - Abnormal; Notable for the following components:   Sodium 131 (*)    Potassium 2.5 (*)    Chloride 85 (*)    CO2 33 (*)    Glucose, Bld 134 (*)    Calcium 8.7 (*)    All other components within normal limits  URINALYSIS, ROUTINE W REFLEX MICROSCOPIC - Abnormal; Notable for the following components:   APPearance TURBID (*)    Hgb urine dipstick MODERATE (*)    Protein, ur 100 (*)    Leukocytes, UA MODERATE (*)    RBC / HPF >50 (*)    WBC, UA >50 (*)    Bacteria, UA MANY (*)    All other components within normal limits  C-REACTIVE PROTEIN - Abnormal; Notable for the following components:   CRP 11.9 (*)    All other components within normal limits  CULTURE, BLOOD (ROUTINE X 2)  CULTURE, BLOOD (ROUTINE X 2)  URINE CULTURE  AEROBIC/ANAEROBIC CULTURE (SURGICAL/DEEP WOUND)  MRSA PCR SCREENING  MAGNESIUM  PROCALCITONIN  SEDIMENTATION RATE  CBC  BASIC METABOLIC  PANEL  I-STAT CG4 LACTIC ACID, ED  I-STAT CG4 LACTIC ACID, ED    EKG None  Radiology Dg Chest 2 View  Result Date: 07/05/2018 CLINICAL DATA:  Unwitnessed fall. EXAM: CHEST - 2 VIEW COMPARISON:  Radiographs of May 28, 2018. FINDINGS: The heart size and mediastinal contours are within normal limits. Both lungs are clear. The visualized skeletal structures are unremarkable. IMPRESSION: No active cardiopulmonary disease. Electronically Signed   By: Marijo Conception, M.D.   On: 07/05/2018 18:45   Dg Pelvis 1-2 Views  Result Date: 07/05/2018 CLINICAL DATA:  Unwitnessed fall. EXAM: PELVIS - 1-2 VIEW COMPARISON:  None. FINDINGS: There is no evidence of pelvic fracture or diastasis. No pelvic bone lesions are seen. IMPRESSION: Negative. Electronically Signed   By: Marijo Conception, M.D.   On: 07/05/2018 18:46   Ct Head Wo Contrast  Result Date: 07/05/2018 CLINICAL DATA:  Unwitnessed fall. High clinical risk cervical spine trauma EXAM: CT HEAD WITHOUT CONTRAST CT CERVICAL SPINE WITHOUT CONTRAST TECHNIQUE: Multidetector CT imaging of the head and cervical spine was performed following the standard protocol without intravenous contrast. Multiplanar CT image reconstructions of the cervical spine were also generated. COMPARISON:  05/28/2018 FINDINGS: CT HEAD FINDINGS Brain: No evidence of acute infarction, hemorrhage, hydrocephalus, extra-axial collection or mass lesion/mass effect. Vascular: Atherosclerotic calcification Skull: Negative for fracture Sinuses/Orbits: No evidence of injury. Bilateral cataract resection. Chronic sinusitis with extensive mucosal thickening in the frontal, ethmoid, and right more than left maxillary sinuses. Chronic left partial mastoid opacification with negative nasopharynx. CT CERVICAL SPINE FINDINGS Alignment: No traumatic malalignment Skull base and vertebrae: Negative for fracture Soft tissues and spinal canal: No prevertebral fluid or swelling.  No visible canal  hematoma. Disc levels: Extensive disc and facet degeneration with multilevel foraminal stenosis. Retro dental ligamentous thickening and calcification without erosion. Upper chest: Negative IMPRESSION: No evidence of acute intracranial or cervical spine injury Electronically Signed   By: Monte Fantasia M.D.   On: 07/05/2018 19:06   Ct Cervical Spine Wo Contrast  Result Date: 07/05/2018 CLINICAL DATA:  Unwitnessed fall. High clinical risk cervical spine trauma EXAM: CT HEAD WITHOUT CONTRAST CT CERVICAL SPINE WITHOUT CONTRAST TECHNIQUE: Multidetector CT imaging of the head and cervical spine was performed following the standard protocol without intravenous contrast. Multiplanar CT image reconstructions of the cervical spine were also generated. COMPARISON:  05/28/2018 FINDINGS: CT HEAD FINDINGS Brain: No evidence of acute infarction, hemorrhage, hydrocephalus, extra-axial collection or mass lesion/mass effect. Vascular: Atherosclerotic calcification Skull: Negative for fracture Sinuses/Orbits: No evidence of injury. Bilateral cataract resection. Chronic sinusitis with extensive mucosal thickening in the frontal, ethmoid, and right more than left maxillary sinuses. Chronic left partial mastoid opacification with negative nasopharynx. CT CERVICAL SPINE FINDINGS Alignment: No traumatic malalignment Skull base and vertebrae: Negative for fracture Soft tissues and spinal canal: No prevertebral fluid or swelling. No visible canal hematoma. Disc levels: Extensive disc and facet degeneration with multilevel foraminal stenosis. Retro dental ligamentous thickening and calcification without erosion. Upper chest: Negative IMPRESSION: No evidence of acute intracranial or cervical spine injury Electronically Signed   By: Monte Fantasia M.D.   On: 07/05/2018 19:06    Procedures Procedures (including critical care time)  Medications Ordered in ED Medications  potassium chloride 10 mEq in 100 mL IVPB (10 mEq Intravenous  New Bag/Given 07/05/18 2328)  ceFEPIme (MAXIPIME) 2 g in sodium chloride 0.9 % 100 mL IVPB (has no administration in time range)  vancomycin (VANCOCIN) IVPB 1000 mg/200 mL premix (has no administration in time range)  acetaminophen (TYLENOL) tablet 650 mg (has no administration in time range)    Or  acetaminophen (TYLENOL) suppository 650 mg (has no administration in time range)  ondansetron (ZOFRAN) tablet 4 mg (has no administration in time range)    Or  ondansetron (ZOFRAN) injection 4 mg (has no administration in time range)  enoxaparin (LOVENOX) injection 40 mg (has no administration in time range)  morphine 2 MG/ML injection 2 mg (has no administration in time range)  vancomycin (VANCOCIN) IVPB 750 mg/150 ml premix (has no administration in time range)  ceFEPIme (MAXIPIME) 2 g in sodium chloride 0.9 % 100 mL IVPB (has no administration in time range)  potassium chloride (KLOR-CON) packet 40 mEq (40 mEq Oral Given 07/05/18 2212)     Initial Impression / Assessment and Plan / ED Course  I have reviewed the triage vital signs and the nursing notes.  Pertinent labs & imaging results that were available during my care of the patient were reviewed by me and considered in my medical decision making (see chart for details).    Patient is an 84 year old male with PMHx of HTN, CKD, dementia, prostate and bladder cancer s/p chronic indwelling Foley catheter, CAD, HFrEF (EF 35-40%), recurrent falls and recurrent UTI who presents s/p unwitnessed fall at [facility] with complaints of buttock pain and left lateral neck pain.  Of note patient received morphine 2 hours prior to fall.  On arrival he is HDS and AOx2.  Able to follow commands.  Exam as above significant for large sacral decubitus ulcer stage 4 and left knee skin tear otherwise no signs of acute trauma.  As patient otherwise at baseline state  of health prior to fall without fever; will obtain baseline labs to assess for anemia and  electrolyte abnormalities.  Imaging obtained as below.  CTH and CT C spine -no evidence of acute intracranial cervical spine injury CXR -no obvious PTX or acute cardiopulmonary process Pelvic XR -negative for acute fracture  Patient found to have significant hypokalemia of 2.5, hyponatremia of 131, and hypochloremia of 85.  Magnesium 2.1.  Potassium repleted.  Patient was also found to have a leukocytosis of 19 which is greater than previous admission for UTI.  Concern for sacral decubitus cellulitis vs osteomyelitis.  Patient will need inpatient imaging to further assess.  Broad-spectrum Abx given after cultures were drawn.  1/4 SIRS criteria therefore code sepsis not initiated.  No overlying crepitus to suggest necrotizing fasciitis.  Urine and inflammatory markers pending.  Case discussed with hospitalist who admit for further management and evaluation.  No traumatic injuries identified on fall.  HDS at transfer.   Final Clinical Impressions(s) / ED Diagnoses   Final diagnoses:  Fall, initial encounter  Pressure injury of sacral region, stage 4 [facility])    ED Discharge Orders    None       Fabian November, MD 07/06/18 Cruz Condon, MD 07/08/18 [phone]

## 2018-07-05 NOTE — ED Triage Notes (Signed)
Per EMS, pt coming from [facility] home where pt had an unwitnessed fall. Pt received morphine for pain about two hours before the fall. Pt complains of no pain but has skin tears on L elbow and L knee. Pt is not on blood thinners. Pt does have large ulcer on buttocks.

## 2018-07-05 NOTE — ED Notes (Signed)
Patient transported to X-ray 

## 2018-07-05 NOTE — H&P (Signed)
History and Physical    [name] XMI:[id number] DOB: 1926-10-24 DOA: 07/05/2018  PCP: Gregor Hams, FNP  Patient coming from: SNF  I have personally briefly reviewed patient's old medical records in [place]  Chief Complaint: Fall  HPI: [name] is a 84 y.o. male with medical history significant of HTN, CKD, dementia, prostate and bladder cancer s/p chronic indwelling foley, CHF EF 35-40%, stroke, recurrent falls and recurrent UTIs, sacral decubitus, A.Fib not on anticoagulation.  Patient presents to the ED after he was found following an unwitnessed fall at [facility].  Patient had morphine 2h prior to fall for sacral decubitus pain.  Patient initially complaining of buttocks and L sided neck pain.   ED Course: Noted to have Stage 4 decubitus with surrounding erythema.  K 2.5.  X rays of pelvis, chest, CT head and neck are neg for acute traumatic injury.   Review of Systems: Unable to perform due to dementia.  Past Medical History:  Diagnosis Date  . A-fib ([place])   . AKI (acute kidney injury) ([place])   . Arthritis   . Benign hypertension with chronic kidney disease, stage III ([place]) 11/28/2015  . Bladder cancer ([place])   . CAD (coronary artery disease) 05/31/2016   cath 02/28/11: dLM 10%, pLAD 20%, pRCA calcified nodule 50% (FFR 0.86 - not significant), mRCA 40-50%, mildly elevated filling pressures, EF 50-55%  . Cataract   . Chronic kidney disease   . CKD (chronic kidney disease) stage 3, GFR 30-59 ml/min (HCC)   . CKD (chronic kidney disease), stage III ([place]) 11/28/2015   follows up with Dr. Clydell Hakim Last creatinine 1.36, EGFR 50  . Dyslipidemia   . First degree AV block 06/28/2013  . Gait abnormality 05/26/2017  . Glaucoma   . Hard of hearing   . Hyperlipidemia 11/28/2015   Last Assessment & Plan: Lipids were at goal a year ago; continue statin  . Hypertension    LOV with EKG 06/11/11 Dr Caryl Comes  [facility]  . Malignant neoplasm of prostate [facility])    surgery and radiation  . Malignant neoplasm of urinary bladder ([place]) 07/01/2012   surgery and chemo  . Memory difficulty 05/26/2017  . NSVT (nonsustained ventricular tachycardia) ([place]) 11/28/2015   Overview: Overview: Overview: echo 6/12: EF 55%, basal inferolat AK, mild LVH Last Assessment & Plan: resolved Overview: echo 6/12: EF 55%, basal inferolat AK, mild LVH Last Assessment & Plan: resolved  . Paroxysmal ventricular tachycardia ([place]) 05/31/2016   Overview: Overview: echo 6/12: EF 55%, basal inferolat AK, mild LVH Last Assessment & Plan: resolved  . Prostate cancer ([place])    status post brachytherapy---in 2003  . PVC (premature ventricular contraction)   . Sleep apnea    STOP BANG SCORE 4  . Ventricular bigeminy 05/31/2016   Overview: Overview: rhythmol Date PR QRS 6/12 23 088 10/13 24 110 10/14 31 109 Last Assessment & Plan: The patient is without symptomatic oral echocardiographic ectopy. His exercise tolerance is quite good. I am concerned about the interval prolongation of his PR interval. We will follow this closely. It is potentially related to the presence of his 1C antiarrhythmic and we may need to discontinue  . Ventricular tachycardia, nonsustained/bigeminy    echo 6/12: EF 55%, basal inferolat AK, mild LVH    Past Surgical History:  Procedure Laterality Date  . APPENDECTOMY  1951  . BLADDER SURGERY  2013   followed by chemo  . CARDIAC CATHETERIZATION  6/12  . CATARACT EXTRACTION  W/ INTRAOCULAR LENS IMPLANT Right 2016   Right eye [facility] Dr. Lennox Pippins  . CATARACT EXTRACTION W/ INTRAOCULAR LENS IMPLANT Left 2014  . CORONARY ANGIOPLASTY     no significant blockage requiring stenting per patient  . CYSTOSCOPY  05/20/2012   Procedure: CYSTOSCOPY;  Surgeon: Alexis Frock, MD;  Location: [facility]S;  Service: Urology;  Laterality: N/A;  . CYSTOSCOPY  07/01/2012   Procedure: CYSTOSCOPY;  Surgeon: Molli Hazard, MD;  Location: [facility];  Service: Urology;  Laterality: N/A;  .  DESCEMETS STRIPPING AUTOMATED ENDOTHELIAL KERATOPLASTY Right 06/13/2016   Procedure: DESCEMETS STRIPPING ENDOTHELIAL KERATOPLASTY; Surgeon: Marilynne Halsted, MD; Location: [facility] MAIN OR; Service: Ophthalmology; Laterality: Right;  . DESCEMETS STRIPPING AUTOMATED ENDOTHELIAL KERATOPLASTY Right 11/14/2016   Procedure: DESCEMETS STRIPPING ENDOTHELIAL KERATOPLASTY; Surgeon: Marilynne Halsted, MD; Location: [facility] MAIN OR; Service: Ophthalmology; Laterality: Right;  . EYE SURGERY     left cataract extraction with IOL  . PROSTATE SURGERY  2003   followed by radiation  . SKIN BIOPSY     BCC excised   . SKIN CANCER EXCISION     x 4  . TRANSURETHRAL RESECTION OF BLADDER TUMOR  05/20/2012   Procedure: TRANSURETHRAL RESECTION OF BLADDER TUMOR (TURBT);  Surgeon: Alexis Frock, MD;  Location: [facility];  Service: Urology;  Laterality: N/A;  [place]   . TRANSURETHRAL RESECTION OF BLADDER TUMOR  07/01/2012   Procedure: TRANSURETHRAL RESECTION OF BLADDER TUMOR (TURBT);  Surgeon: Molli Hazard, MD;  Location: [facility];  Service: Urology;  Laterality: N/A;  Cystoscopy, TURBT, be prepared for Open Cystorraphy         reports that he quit smoking about 23 years ago. His smoking use included cigars. His smokeless tobacco use includes chew. He reports that he drank alcohol. He reports that he does not use drugs.  Allergies  Allergen Reactions  . Neosporin [Neomycin-Bacitracin Zn-Polymyx] Other (See Comments)    Makes his skin red   . Penicillins Hives and Itching    Has patient had a PCN reaction causing immediate rash, facial/tongue/throat swelling, SOB or lightheadedness with hypotension: Yes Has patient had a PCN reaction causing severe rash involving mucus membranes or skin necrosis: No Has patient had a PCN reaction that required hospitalization: Unknown Has patient had a PCN reaction occurring within the last 10 years: Unknown If all of the above answers are "NO", then may proceed with  Cephalosporin use.   Marland Kitchen Penicillin G Rash    Family History  Problem Relation Age of Onset  . Hypertension Mother   . Arthritis Son   . Heart Problems Son        Aortic valve replacement  . Lung cancer Daughter   . Heart attack Neg Hx   . Stroke Neg Hx      Prior to Admission medications   Medication Sig Start Date End Date Taking? Authorizing Provider  acetaminophen (TYLENOL) 500 MG tablet Take 500 mg by mouth every 6 (six) hours as needed for mild pain.    [provider]  aspirin EC 81 MG EC tablet Take 1 tablet (81 mg total) by mouth daily. 05/31/18   Elgergawy, Silver Huguenin, MD  barrier cream (NON-SPECIFIED) CREA Apply 1 application topically 3 (three) times daily as needed (sacrum and groin redness).    [provider]  collagenase (SANTYL) ointment Apply topically daily. Apply Santyl to upper and lower sacrum wounds Q day, then cover with moist gauze dressing and foam dressing.  (Change foam dressing Q 3 days or  PRN soiling.) Patient not taking: Reported on 05/28/2018 04/25/18   Samuella Cota, MD  furosemide (LASIX) 20 MG tablet Take 1 tablet (20 mg total) by mouth daily. Hold for 2 days and resume on 06/02/2018 05/30/18   Elgergawy, Silver Huguenin, MD  hydrOXYzine (ATARAX/VISTARIL) 25 MG tablet Take 25 mg by mouth 3 (three) times daily.    [provider]  lactobacillus acidophilus & bulgar (LACTINEX) chewable tablet Chew 1 tablet by mouth 3 (three) times daily with meals. 05/30/18   Elgergawy, Silver Huguenin, MD  OXYGEN Inhale 2 L into the lungs continuous.    [provider]  potassium chloride (K-DUR) 10 MEQ tablet Please take 20 mg oral twice daily for 5 days, then 20 mg oral daily for 5 days then stop. 05/30/18   Elgergawy, Silver Huguenin, MD  prednisoLONE acetate (PRED FORTE) 1 % ophthalmic suspension Place 2 drops into both eyes 2 (two) times daily as needed (allergies).     [provider]    Physical Exam: Vitals:   07/05/18 1930 07/05/18 2000  07/05/18 2030 07/05/18 2045  BP: (!) 109/98 104/88  (!) 144/76  Pulse: 94 97 100 60  Resp: 14 (!) _0 Temp:      TempSrc:      SpO2: 100% 100% 100% 100%  Weight:      Height:        Constitutional: NAD, Elderly, cachectic Eyes: PERRL, lids and conjunctivae normal ENMT: Mucous membranes are moist. Posterior pharynx clear of any exudate or lesions.Normal dentition.  Neck: normal, supple, no masses, no thyromegaly Respiratory: clear to auscultation bilaterally, no wheezing, no crackles. Normal respiratory effort. No accessory muscle use.  Cardiovascular: Regular rate and rhythm, no murmurs / rubs / gallops. No extremity edema. 2+ pedal pulses. No carotid bruits.  Abdomen: no tenderness, no masses palpated. No hepatosplenomegaly. Bowel sounds positive.  Musculoskeletal: no clubbing / cyanosis. No joint deformity upper and lower extremities. Good ROM, no contractures. Normal muscle tone.  Skin: Stage 4 sacral decubitus significantly larger than picture from admission last month, now with surrounding erythema and necrotic tissue, foul odor.  Significant TTP Neurologic: CN 2-12 grossly intact. Sensation intact, DTR normal. Strength 5/5 in all 4.  Psychiatric: Oriented to self, place, not time, follows commands, complains that he is thirsty.   Labs on Admission: I have personally reviewed following labs and imaging studies  CBC: Recent Labs  Lab 07/05/18 1803  WBC 19.0*  NEUTROABS 13.8*  HGB 12.7*  HCT 40.6  MCV 83.0  PLT 546*   Basic Metabolic Panel: Recent Labs  Lab 07/05/18 1803 07/05/18 2019  NA 131*  --   K 2.5*  --   CL 85*  --   CO2 33*  --   GLUCOSE 134*  --   BUN 13  --   CREATININE 1.06  --   CALCIUM 8.7*  --   MG  --  2.1   GFR: Estimated Creatinine Clearance: 47.2 mL/min (by C-G formula based on SCr of 1.06 mg/dL). Liver Function Tests: No results for input(s): AST, ALT, ALKPHOS, BILITOT, PROT, ALBUMIN in the last 168 hours. No results for input(s):  LIPASE, AMYLASE in the last 168 hours. No results for input(s): AMMONIA in the last 168 hours. Coagulation Profile: No results for input(s): INR, PROTIME in the last 168 hours. Cardiac Enzymes: No results for input(s): CKTOTAL, CKMB, CKMBINDEX, TROPONINI in the last 168 hours. BNP (last 3 results) No results for input(s): PROBNP in the  last 8760 hours. HbA1C: No results for input(s): HGBA1C in the last 72 hours. CBG: No results for input(s): GLUCAP in the last 168 hours. Lipid Profile: No results for input(s): CHOL, HDL, LDLCALC, TRIG, CHOLHDL, LDLDIRECT in the last 72 hours. Thyroid Function Tests: No results for input(s): TSH, T4TOTAL, FREET4, T3FREE, THYROIDAB in the last 72 hours. Anemia Panel: No results for input(s): VITAMINB12, FOLATE, FERRITIN, TIBC, IRON, RETICCTPCT in the last 72 hours. Urine analysis:    Component Value Date/Time   COLORURINE YELLOW 05/28/2018 1033   APPEARANCEUR TURBID (A) 05/28/2018 1033   LABSPEC 1.020 05/28/2018 1033   PHURINE 7.5 05/28/2018 1033   GLUCOSEU NEGATIVE 05/28/2018 1033   HGBUR SMALL (A) 05/28/2018 1033   BILIRUBINUR SMALL (A) 05/28/2018 1033   KETONESUR NEGATIVE 05/28/2018 1033   PROTEINUR 100 (A) 05/28/2018 1033   NITRITE NEGATIVE 05/28/2018 1033   LEUKOCYTESUR LARGE (A) 05/28/2018 1033    Radiological Exams on Admission: Dg Chest 2 View  Result Date: 07/05/2018 CLINICAL DATA:  Unwitnessed fall. EXAM: CHEST - 2 VIEW COMPARISON:  Radiographs of May 28, 2018. FINDINGS: The heart size and mediastinal contours are within normal limits. Both lungs are clear. The visualized skeletal structures are unremarkable. IMPRESSION: No active cardiopulmonary disease. Electronically Signed   By: Marijo Conception, M.D.   On: 07/05/2018 18:45   Dg Pelvis 1-2 Views  Result Date: 07/05/2018 CLINICAL DATA:  Unwitnessed fall. EXAM: PELVIS - 1-2 VIEW COMPARISON:  None. FINDINGS: There is no evidence of pelvic fracture or diastasis. No pelvic bone  lesions are seen. IMPRESSION: Negative. Electronically Signed   By: Marijo Conception, M.D.   On: 07/05/2018 18:46   Ct Head Wo Contrast  Result Date: 07/05/2018 CLINICAL DATA:  Unwitnessed fall. High clinical risk cervical spine trauma EXAM: CT HEAD WITHOUT CONTRAST CT CERVICAL SPINE WITHOUT CONTRAST TECHNIQUE: Multidetector CT imaging of the head and cervical spine was performed following the standard protocol without intravenous contrast. Multiplanar CT image reconstructions of the cervical spine were also generated. COMPARISON:  05/28/2018 FINDINGS: CT HEAD FINDINGS Brain: No evidence of acute infarction, hemorrhage, hydrocephalus, extra-axial collection or mass lesion/mass effect. Vascular: Atherosclerotic calcification Skull: Negative for fracture Sinuses/Orbits: No evidence of injury. Bilateral cataract resection. Chronic sinusitis with extensive mucosal thickening in the frontal, ethmoid, and right more than left maxillary sinuses. Chronic left partial mastoid opacification with negative nasopharynx. CT CERVICAL SPINE FINDINGS Alignment: No traumatic malalignment Skull base and vertebrae: Negative for fracture Soft tissues and spinal canal: No prevertebral fluid or swelling. No visible canal hematoma. Disc levels: Extensive disc and facet degeneration with multilevel foraminal stenosis. Retro dental ligamentous thickening and calcification without erosion. Upper chest: Negative IMPRESSION: No evidence of acute intracranial or cervical spine injury Electronically Signed   By: Monte Fantasia M.D.   On: 07/05/2018 19:06   Ct Cervical Spine Wo Contrast  Result Date: 07/05/2018 CLINICAL DATA:  Unwitnessed fall. High clinical risk cervical spine trauma EXAM: CT HEAD WITHOUT CONTRAST CT CERVICAL SPINE WITHOUT CONTRAST TECHNIQUE: Multidetector CT imaging of the head and cervical spine was performed following the standard protocol without intravenous contrast. Multiplanar CT image reconstructions of the  cervical spine were also generated. COMPARISON:  05/28/2018 FINDINGS: CT HEAD FINDINGS Brain: No evidence of acute infarction, hemorrhage, hydrocephalus, extra-axial collection or mass lesion/mass effect. Vascular: Atherosclerotic calcification Skull: Negative for fracture Sinuses/Orbits: No evidence of injury. Bilateral cataract resection. Chronic sinusitis with extensive mucosal thickening in the frontal, ethmoid, and right more than left maxillary sinuses. Chronic left  partial mastoid opacification with negative nasopharynx. CT CERVICAL SPINE FINDINGS Alignment: No traumatic malalignment Skull base and vertebrae: Negative for fracture Soft tissues and spinal canal: No prevertebral fluid or swelling. No visible canal hematoma. Disc levels: Extensive disc and facet degeneration with multilevel foraminal stenosis. Retro dental ligamentous thickening and calcification without erosion. Upper chest: Negative IMPRESSION: No evidence of acute intracranial or cervical spine injury Electronically Signed   By: Monte Fantasia M.D.   On: 07/05/2018 19:06    EKG: Independently reviewed.  Assessment/Plan Principal Problem:   Decubitus ulcer of sacral region, stage 4 (HCC) Active Problems:   Atrial fibrillation (HCC)   Leukocytosis   Hypokalemia   Cellulitis of buttock    1. Stage 4 sacral decubitus with cellulitis - 1. Empiric cefepime / vanc 2. Wound, blood cultures 3. Wound care consult 4. MRI pelvis 5. Morphine PRN pain 2. Leukocytosis - likely secondary to #1 above, no other SIRS 1. Repeat CBC in AM to trend leukocytosis 2. UA pending, UCx pending.  Chronic indwelling foley is another possible source. 3. Hypokalemia - 1. Replace 2. Repeat BMP in AM 3. Hold lasix for the moment 4. A.Fib - 1. Chronic and rate controlled 2. Not on anticoags chronically, I assume this may be related to fall risk.  DVT prophylaxis: Lovenox Code Status: Partial code: NO CPR, do intubate if needed Family  Communication: No family in room Disposition Plan: SNF after admit Consults called: None Admission status: Place in obs - suspect he will need to be converted to [place], [place] [facility] Pager [phone] Only works nights!  If 7AM-7PM, please contact the primary day team physician taking care of patient  [email or web address] Password [email or web address]  07/05/2018, 9:26 PM

## 2018-07-05 NOTE — Progress Notes (Signed)
New Admission Note:  Arrival Method: By bed from ED around 2230. Pt was vomiting.  Mental Orientation: Alert to self Telemetry: None Assessment: Completed Skin: Completed, refer to flowsheets, has unstageable sacral wound IV: Left wrist Pain: When touching wound Tubes: Foley  Safety Measures: Safety Fall Prevention Plan was given, discussed  Admission: Completed [facility] Orientation: Patient has been orientated to the room, unit and the staff. Family: None  Orders have been reviewed and implemented. Will continue to monitor the patient. Call light has been placed within reach and bed alarm has been activated.   Perry Mount, RN  Phone Number: [phone]

## 2018-07-05 NOTE — Progress Notes (Signed)
Pharmacy Antibiotic Note  [name] is a 84 y.o. male admitted on 07/05/2018 with fall and concern for sepsis. Pharmacy has been consulted for Vancomycin + Cefepime dosing. SCr 1.06, CrCl~45-50 ml/min. LA 1.91  Plan: - Vancomycin 1g IV x 1 followed by 750 mg IV every 12 hours - Cefepime 2g IV x 1 followed by 2g IV every 12 hours - Will continue to follow renal function, culture results, LOT, and antibiotic de-escalation plans   Height: 5\' 9"  (175.3 cm) Weight: 160 lb 15 oz (73 kg) IBW/kg (Calculated) : 70.7  Temp (24hrs), Avg:97.4 F (36.3 C), Min:97.4 F (36.3 C), Max:97.4 F (36.3 C)  Recent Labs  Lab 07/05/18 1803  WBC 19.0*  CREATININE 1.06    Estimated Creatinine Clearance: 47.2 mL/min (by C-G formula based on SCr of 1.06 mg/dL).    Allergies  Allergen Reactions  . Neosporin [Neomycin-Bacitracin Zn-Polymyx] Other (See Comments)    Makes his skin red   . Penicillins Hives and Itching    Has patient had a PCN reaction causing immediate rash, facial/tongue/throat swelling, SOB or lightheadedness with hypotension: Yes Has patient had a PCN reaction causing severe rash involving mucus membranes or skin necrosis: No Has patient had a PCN reaction that required hospitalization: Unknown Has patient had a PCN reaction occurring within the last 10 years: Unknown If all of the above answers are "NO", then may proceed with Cephalosporin use.   Marland Kitchen Penicillin G Rash    Antimicrobials this admission: Vanc 10/20 >> Cefepime 10/20 >>  Dose adjustments this admission: n/a  Microbiology results: 10/20 Bx >> 10/20 UCx >> 10/20 WCx >>  Thank you for allowing pharmacy to be a part of this patient's care.  Alycia Rossetti, PharmD, BCPS Clinical Pharmacist Please check AMION for all [place] numbers 07/05/2018 9:30 PM

## 2018-07-06 DIAGNOSIS — E871 Hypo-osmolality and hyponatremia: Secondary | ICD-10-CM

## 2018-07-06 DIAGNOSIS — I2510 Atherosclerotic heart disease of native coronary artery without angina pectoris: Secondary | ICD-10-CM

## 2018-07-06 DIAGNOSIS — I10 Essential (primary) hypertension: Secondary | ICD-10-CM

## 2018-07-06 DIAGNOSIS — I5022 Chronic systolic (congestive) heart failure: Secondary | ICD-10-CM

## 2018-07-06 DIAGNOSIS — T83511A Infection and inflammatory reaction due to indwelling urethral catheter, initial encounter: Secondary | ICD-10-CM

## 2018-07-06 DIAGNOSIS — L89154 Pressure ulcer of sacral region, stage 4: Secondary | ICD-10-CM | POA: Diagnosis not present

## 2018-07-06 DIAGNOSIS — N390 Urinary tract infection, site not specified: Secondary | ICD-10-CM

## 2018-07-06 DIAGNOSIS — J9611 Chronic respiratory failure with hypoxia: Secondary | ICD-10-CM

## 2018-07-06 DIAGNOSIS — C679 Malignant neoplasm of bladder, unspecified: Secondary | ICD-10-CM | POA: Diagnosis not present

## 2018-07-06 DIAGNOSIS — I480 Paroxysmal atrial fibrillation: Secondary | ICD-10-CM | POA: Diagnosis not present

## 2018-07-06 DIAGNOSIS — R413 Other amnesia: Secondary | ICD-10-CM

## 2018-07-06 LAB — CBC
HEMATOCRIT: 43.6 % (ref 39.0–52.0)
HEMOGLOBIN: 13.4 g/dL (ref 13.0–17.0)
MCH: 26.4 pg (ref 26.0–34.0)
MCHC: 30.7 g/dL (ref 30.0–36.0)
MCV: 86 fL (ref 80.0–100.0)
Platelets: 430 10*3/uL — ABNORMAL HIGH (ref 150–400)
RBC: 5.07 MIL/uL (ref 4.22–5.81)
RDW: 13.8 % (ref 11.5–15.5)
WBC: 9.7 10*3/uL (ref 4.0–10.5)
nRBC: 0 % (ref 0.0–0.2)

## 2018-07-06 LAB — BASIC METABOLIC PANEL
ANION GAP: 17 — AB (ref 5–15)
BUN: 14 mg/dL (ref 8–23)
CALCIUM: 8.7 mg/dL — AB (ref 8.9–10.3)
CO2: 24 mmol/L (ref 22–32)
Chloride: 91 mmol/L — ABNORMAL LOW (ref 98–111)
Creatinine, Ser: 1.05 mg/dL (ref 0.61–1.24)
Glucose, Bld: 137 mg/dL — ABNORMAL HIGH (ref 70–99)
Potassium: 3.9 mmol/L (ref 3.5–5.1)
SODIUM: 132 mmol/L — AB (ref 135–145)

## 2018-07-06 LAB — MRSA PCR SCREENING: MRSA by PCR: POSITIVE — AB

## 2018-07-06 LAB — C-REACTIVE PROTEIN: CRP: 11.9 mg/dL — AB (ref <1.0)

## 2018-07-06 LAB — PROCALCITONIN: Procalcitonin: 0.1 ng/mL

## 2018-07-06 LAB — SEDIMENTATION RATE: SED RATE: 34 mm/h — AB (ref 0–16)

## 2018-07-06 MED ORDER — POTASSIUM CHLORIDE ER 10 MEQ PO TBCR: 10.0000 meq | EXTENDED_RELEASE_TABLET | Freq: Every day | ORAL | Status: DC

## 2018-07-06 MED ORDER — CEFDINIR 300 MG PO CAPS: 300.0000 mg | ORAL_CAPSULE | Freq: Two times a day (BID) | ORAL | 0 refills | Status: DC

## 2018-07-06 MED ORDER — SODIUM CHLORIDE 0.9 % IV SOLN
2.0000 g | INTRAVENOUS | Status: DC
  Administered 2018-07-07 – 2018-07-08 (×2): 2 g via INTRAVENOUS
  Filled 2018-07-06 (×2): qty 2

## 2018-07-06 MED ORDER — FUROSEMIDE 20 MG PO TABS: 20.0000 mg | ORAL_TABLET | Freq: Every day | ORAL | Status: AC

## 2018-07-06 NOTE — Discharge Summary (Signed)
Physician Discharge Summary  Griffyn Kucinski Honeycutt OZH:[id number] DOB: 21-Feb-1927 DOA: 07/05/2018  PCP: Gregor Hams, FNP  Admit date: 07/05/2018 Discharge date: 07/06/2018  Admitted From: SNF Disposition: SNF   Recommendations for Outpatient Follow-up:  1. Follow up with PCP 2. Continue local wound care for sacral decubitus. No surgical evaluation was recommended and wound does not appear infected.  3. Continue cefdinir 300mg  po BID x10 days for UTI, possible nonpurulent SSTI complicating sacral decubitus with cultures pending. Has tolerated cephalosporins in the past.  Home Health: N/A Equipment/Devices: Per SNF Discharge Condition: Stable CODE STATUS: Partial In the event of cardiac or respiratory ARREST: Initiate Code Blue, Call Rapid Response Yes   In the event of cardiac or respiratory ARREST: Perform CPR No   In the event of cardiac or respiratory ARREST: Perform Intubation/Mechanical Ventilation Yes   In the event of cardiac or respiratory ARREST: Use NIPPV/BiPAp only if indicated Yes   In the event of cardiac or respiratory ARREST: Administer ACLS medications if indicated No   In the event of cardiac or respiratory ARREST: Perform Defibrillation or Cardioversion if indicated No    Diet recommendation: As tolerated  Brief/Interim Summary: [name] is a 84 y.o. male with medical history significant for HTN, CKD, dementia, prostate and bladder cancer s/p chronic indwelling foley, CHF EF 35-40%, stroke, recurrent falls and recurrent UTIs, sacral decubitus, A.Fib not on anticoagulation who presented from [facility] after an unwitnessed fall. He complained of pain in the buttocks and left neck. He was hemodynamically stable on presentation with no fever. He had pyuria from indwelling foley, WBC elevated to 19k, and potassium low at 2.5. This is very similar to previous presentations. Imaging including CT head, CT cervical spine, chest xray, pelvic xray found no acute issues.  There was a stage 4 sacral decubitus ulcer seen in the ED described as with associated erythema, no purulent discharge, tender to palpation. He did not complain of any changes to the wound or pain associated with it. With leukocytosis there was concern for sacral osteomyelitis vs. cellulitis. Vancomycin and cefepime were started, though the patient did not qualify for the diagnosis of sepsis. He was observed overnight. Procalcitonin noted to be 0.10. Leukocytosis resolved in < 12 hours with IV fluids. Potassium normalized. Wound care consultant recommended continued local wound care, and surgery was consulted. Noted a clean wound with string of necrotic tissue, and was comfortable with nonoperative management ongoing.   He will be discharged on cefdinir (prior urine cultures and tolerances to antibiotics were reviewed) pending final urine culture data.   Discharge Diagnoses:  Principal Problem:   Decubitus ulcer of sacral region, stage 4 ([place]) Active Problems:   Nonobstructive CAD (coronary artery disease)   Hypertension   Bladder cancer (HCC)   Atrial fibrillation (HCC)   Hyponatremia   Memory difficulty   Chronic systolic (congestive) heart failure (HCC)   Catheter-associated urinary tract infection (HCC)   Chronic respiratory failure with hypoxia (HCC)   Leukocytosis   Hypokalemia   Cellulitis of buttock  Stage 4 sacral decubitus ulcer with cellulitis: No purulence noted, will treat empirically for SSTI though there is limited erythema around wound. Do not believe further imaging would be helpful. Surgery was kind enough to consult and agrees that no further intervention is warranted. Has remained afebrile.  - Continue abx, cefdinir for this and would most likely cover UTI as well.  - Monitor blood cultures.   - Offload as able  CAUTI: POA.  -  Cefdinir pending further culture data  Hypokalemia: Recurrent problem, resolved with replacement.  - Continue 70mEq K with lasix  Chronic  A.Fib: Rate controlled. Not on anticoagulation due to frequent falls. No changes in management indicated.   Unwitnessed fall: No dislocations or fractures on extensive imaging. Recurrent issue. - Continue PT/OT at SNF.   Chronic hypoxic respiratory failure:  - Continue home 2L O2  Chronic systolic CHF: Euvolemic, restart lasix.  Discharge Instructions Discharge Instructions    Diet - low sodium heart healthy   Complete by:  As directed    Increase activity slowly   Complete by:  As directed      Allergies as of 07/06/2018      Reactions   Neosporin [neomycin-bacitracin Zn-polymyx] Other (See Comments)   Makes his skin red   Penicillins Hives, Itching   Has patient had a PCN reaction causing immediate rash, facial/tongue/throat swelling, SOB or lightheadedness with hypotension: Yes Has patient had a PCN reaction causing severe rash involving mucus membranes or skin necrosis: No Has patient had a PCN reaction that required hospitalization: Unknown Has patient had a PCN reaction occurring within the last 10 years: Unknown If all of the above answers are "NO", then may proceed with Cephalosporin use.   Penicillin G Rash      Medication List    STOP taking these medications   clindamycin 300 MG capsule Commonly known as:  CLEOCIN     TAKE these medications   acetaminophen 500 MG tablet Commonly known as:  TYLENOL Take 500 mg by mouth every 6 (six) hours as needed for mild pain.   aspirin 81 MG EC tablet Take 1 tablet (81 mg total) by mouth daily.   barrier cream Crea Commonly known as:  non-specified Apply 1 application topically 3 (three) times daily as needed (sacrum and groin redness).   cefdinir 300 MG capsule Commonly known as:  OMNICEF Take 1 capsule (300 mg total) by mouth 2 (two) times daily.   collagenase ointment Commonly known as:  SANTYL Apply topically daily. Apply Santyl to upper and lower sacrum wounds Q day, then cover with moist gauze dressing and  foam dressing.  (Change foam dressing Q 3 days or PRN soiling.)   furosemide 20 MG tablet Commonly known as:  LASIX Take 1 tablet (20 mg total) by mouth daily. What changed:  additional instructions   hydrOXYzine 25 MG tablet Commonly known as:  ATARAX/VISTARIL Take 25 mg by mouth 3 (three) times daily.   lactobacillus acidophilus & bulgar chewable tablet Chew 1 tablet by mouth 3 (three) times daily with meals.   morphine 20 MG/ML concentrated solution Commonly known as:  ROXANOL Place 5 mg under the tongue every 4 (four) hours as needed for severe pain.   OXYGEN Inhale 2 L into the lungs continuous.   potassium chloride 10 MEQ tablet Commonly known as:  K-DUR Take 1 tablet (10 mEq total) by mouth daily. Please take 20 mg oral twice daily for 5 days, then 20 mg oral daily for 5 days then stop. What changed:    how much to take  how to take this  when to take this   prednisoLONE acetate 1 % ophthalmic suspension Commonly known as:  PRED FORTE Place 2 drops into both eyes 2 (two) times daily as needed (allergies).      Follow-up Information    Gregor Hams, FNP Follow up.   Specialty:  Family Medicine Contact information: [place]  [phone]          Allergies  Allergen Reactions  . Neosporin [Neomycin-Bacitracin Zn-Polymyx] Other (See Comments)    Makes his skin red   . Penicillins Hives and Itching    Has patient had a PCN reaction causing immediate rash, facial/tongue/throat swelling, SOB or lightheadedness with hypotension: Yes Has patient had a PCN reaction causing severe rash involving mucus membranes or skin necrosis: No Has patient had a PCN reaction that required hospitalization: Unknown Has patient had a PCN reaction occurring within the last 10 years: Unknown If all of the above answers are "NO", then may proceed with Cephalosporin use.   Marland Kitchen Penicillin G Rash     Consultations:  Surgery  Procedures/Studies: Dg Chest 2 View  Result Date: 07/05/2018 CLINICAL DATA:  Unwitnessed fall. EXAM: CHEST - 2 VIEW COMPARISON:  Radiographs of May 28, 2018. FINDINGS: The heart size and mediastinal contours are within normal limits. Both lungs are clear. The visualized skeletal structures are unremarkable. IMPRESSION: No active cardiopulmonary disease. Electronically Signed   By: Marijo Conception, M.D.   On: 07/05/2018 18:45   Dg Pelvis 1-2 Views  Result Date: 07/05/2018 CLINICAL DATA:  Unwitnessed fall. EXAM: PELVIS - 1-2 VIEW COMPARISON:  None. FINDINGS: There is no evidence of pelvic fracture or diastasis. No pelvic bone lesions are seen. IMPRESSION: Negative. Electronically Signed   By: Marijo Conception, M.D.   On: 07/05/2018 18:46   Ct Head Wo Contrast  Result Date: 07/05/2018 CLINICAL DATA:  Unwitnessed fall. High clinical risk cervical spine trauma EXAM: CT HEAD WITHOUT CONTRAST CT CERVICAL SPINE WITHOUT CONTRAST TECHNIQUE: Multidetector CT imaging of the head and cervical spine was performed following the standard protocol without intravenous contrast. Multiplanar CT image reconstructions of the cervical spine were also generated. COMPARISON:  05/28/2018 FINDINGS: CT HEAD FINDINGS Brain: No evidence of acute infarction, hemorrhage, hydrocephalus, extra-axial collection or mass lesion/mass effect. Vascular: Atherosclerotic calcification Skull: Negative for fracture Sinuses/Orbits: No evidence of injury. Bilateral cataract resection. Chronic sinusitis with extensive mucosal thickening in the frontal, ethmoid, and right more than left maxillary sinuses. Chronic left partial mastoid opacification with negative nasopharynx. CT CERVICAL SPINE FINDINGS Alignment: No traumatic malalignment Skull base and vertebrae: Negative for fracture Soft tissues and spinal canal: No prevertebral fluid or swelling. No visible canal hematoma. Disc levels: Extensive disc and  facet degeneration with multilevel foraminal stenosis. Retro dental ligamentous thickening and calcification without erosion. Upper chest: Negative IMPRESSION: No evidence of acute intracranial or cervical spine injury Electronically Signed   By: Monte Fantasia M.D.   On: 07/05/2018 19:06   Ct Cervical Spine Wo Contrast  Result Date: 07/05/2018 CLINICAL DATA:  Unwitnessed fall. High clinical risk cervical spine trauma EXAM: CT HEAD WITHOUT CONTRAST CT CERVICAL SPINE WITHOUT CONTRAST TECHNIQUE: Multidetector CT imaging of the head and cervical spine was performed following the standard protocol without intravenous contrast. Multiplanar CT image reconstructions of the cervical spine were also generated. COMPARISON:  05/28/2018 FINDINGS: CT HEAD FINDINGS Brain: No evidence of acute infarction, hemorrhage, hydrocephalus, extra-axial collection or mass lesion/mass effect. Vascular: Atherosclerotic calcification Skull: Negative for fracture Sinuses/Orbits: No evidence of injury. Bilateral cataract resection. Chronic sinusitis with extensive mucosal thickening in the frontal, ethmoid, and right more than left maxillary sinuses. Chronic left partial mastoid opacification with negative nasopharynx. CT CERVICAL SPINE FINDINGS Alignment: No traumatic malalignment Skull base and vertebrae: Negative for fracture Soft tissues and spinal canal: No prevertebral fluid or swelling. No visible canal hematoma. Disc levels: Extensive  disc and facet degeneration with multilevel foraminal stenosis. Retro dental ligamentous thickening and calcification without erosion. Upper chest: Negative IMPRESSION: No evidence of acute intracranial or cervical spine injury Electronically Signed   By: Monte Fantasia M.D.   On: 07/05/2018 19:06    Subjective: Tired but feels better than admission. Pain is controlled without more medications at this time. No fever. Ate breakfast well.   Discharge Exam: Vitals:   07/06/18 0930 07/06/18 0954   BP: (!) 110/58   Pulse: (!) 107 62  Resp: 20   Temp: 98 F (36.7 C)   SpO2: (!) 80% 90%   General: Pt is alert, awake, not in acute distress Cardiovascular: RRR, S1/S2 +, no rubs, no gallops Respiratory: CTA bilaterally, no wheezing, no rhonchi Abdominal: Soft, NT, ND, bowel sounds + Extremities: No edema, no cyanosis Skin: Oblong full thickness wound on sacrum ~5cmx6cmx2cm deep with some undermining with pink moist wound bed under gauze packing, some central slough (~10%) String of white/black tissue from wound edge noted. Minimal erythema (< 1cm) surrounding wound without induration or fluctuance. No purulence or odor.  Labs: BNP (last 3 results) Recent Labs    12/05/17 1936 12/08/17 0415  BNP 1,182.7* 017.4*   Basic Metabolic Panel: Recent Labs  Lab 07/05/18 1803 07/05/18 2019 07/06/18 0438  NA 131*  --  132*  K 2.5*  --  3.9  CL 85*  --  91*  CO2 33*  --  24  GLUCOSE 134*  --  137*  BUN 13  --  14  CREATININE 1.06  --  1.05  CALCIUM 8.7*  --  8.7*  MG  --  2.1  --    Liver Function Tests: No results for input(s): AST, ALT, ALKPHOS, BILITOT, PROT, ALBUMIN in the last 168 hours. No results for input(s): LIPASE, AMYLASE in the last 168 hours. No results for input(s): AMMONIA in the last 168 hours. CBC: Recent Labs  Lab 07/05/18 1803 07/06/18 0438  WBC 19.0* 9.7  NEUTROABS 13.8*  --   HGB 12.7* 13.4  HCT 40.6 43.6  MCV 83.0 86.0  PLT 467* 430*   Cardiac Enzymes: No results for input(s): CKTOTAL, CKMB, CKMBINDEX, TROPONINI in the last 168 hours. BNP: Invalid input(s): POCBNP CBG: No results for input(s): GLUCAP in the last 168 hours. D-Dimer No results for input(s): DDIMER in the last 72 hours. Hgb A1c No results for input(s): HGBA1C in the last 72 hours. Lipid Profile No results for input(s): CHOL, HDL, LDLCALC, TRIG, CHOLHDL, LDLDIRECT in the last 72 hours. Thyroid function studies No results for input(s): TSH, T4TOTAL, T3FREE, THYROIDAB in the  last 72 hours.  Invalid input(s): FREET3 Anemia work up No results for input(s): VITAMINB12, FOLATE, FERRITIN, TIBC, IRON, RETICCTPCT in the last 72 hours. Urinalysis    Component Value Date/Time   COLORURINE YELLOW 07/05/2018 2231   APPEARANCEUR TURBID (A) 07/05/2018 2231   LABSPEC 1.016 07/05/2018 2231   PHURINE 5.0 07/05/2018 2231   GLUCOSEU NEGATIVE 07/05/2018 2231   HGBUR MODERATE (A) 07/05/2018 2231   BILIRUBINUR NEGATIVE 07/05/2018 2231   KETONESUR NEGATIVE 07/05/2018 2231   PROTEINUR 100 (A) 07/05/2018 2231   NITRITE NEGATIVE 07/05/2018 2231   LEUKOCYTESUR MODERATE (A) 07/05/2018 2231    Microbiology Recent Results (from the past 240 hour(s))  Blood culture (routine x 2)     Status: None (Preliminary result)   Collection Time: 07/05/18  5:40 PM  Result Value Ref Range Status   Specimen Description BLOOD RIGHT FOREARM  Final  Special Requests   Final    BOTTLES DRAWN AEROBIC AND ANAEROBIC Blood Culture adequate volume   Culture   Final    NO GROWTH < 12 HOURS Performed at [facility], [place] [address], [place], [place] [address]    Report Status PENDING  Incomplete  Aerobic/Anaerobic Culture (surgical/deep wound)     Status: None (Preliminary result)   Collection Time: 07/05/18  9:18 PM  Result Value Ref Range Status   Specimen Description SACRAL  Final   Special Requests NONE  Final   Gram Stain   Final    FEW WBC PRESENT, PREDOMINANTLY PMN FEW GRAM POSITIVE COCCI Performed at [facility], [address] [place], [place] [address]    Culture PENDING  Incomplete   Report Status PENDING  Incomplete  Blood culture (routine x 2)     Status: None (Preliminary result)   Collection Time: 07/05/18 11:08 PM  Result Value Ref Range Status   Specimen Description BLOOD RIGHT HAND  Final   Special Requests   Final    BOTTLES DRAWN AEROBIC ONLY Blood Culture results may not be optimal due to an inadequate volume of blood received in culture  bottles Performed at [facility] [address], [place],  [address]    Culture PENDING  Incomplete   Report Status PENDING  Incomplete  MRSA PCR Screening     Status: Abnormal   Collection Time: 07/05/18 11:43 PM  Result Value Ref Range Status   MRSA by PCR POSITIVE (A) NEGATIVE Final    Comment:        The GeneXpert MRSA Assay (FDA approved for NASAL specimens only), is one component of a comprehensive MRSA colonization surveillance program. It is not intended to diagnose MRSA infection nor to guide or monitor treatment for MRSA infections. RESULT CALLED TO, READ BACK BY AND VERIFIED WITH: [facility] RN 07/06/18 AT 0123 SKEEN,P     Time coordinating discharge: Approximately 100 minutes  Patrecia Pour, MD  [facility] 07/06/2018, 1:57 PM Pager [phone]

## 2018-07-06 NOTE — Progress Notes (Signed)
Patient ID: [name], male   DOB: [birth year], 84 y.o.   MRN: [id number] Asked to evaluate decub for possible debridement.  Wound is overall clean with one small "string" of necrotic tissue hanging from the edge of the wound.  This will likely debride off with dressing changes or eventually fall off.  Otherwise the wound is clean.  No surgical intervention warranted.  Will contact the primary service to inform him of this evaluation.  Henreitta Cea 1:26 PM 07/06/2018

## 2018-07-06 NOTE — Consult Note (Addendum)
[facility] Nurse wound consult note Reason for Consult: Patient is known to our department from two recent admissions, the last on 05/28/18, approximately 5 weeks ago. Seen by my associate, D. Engels at that time. Necrotic tissue has diminished since last admission and the wound is today, a Stage 3. The wound is larger than last admission. Wound type: Chronic, nonhealing pressure injury Pressure Injury POA: Yes Measurement: 6cm x 5cm x 2cm with 1.5cm undermining from 10 o'clock to 3 o'clock. There is a thin "remnant" of necrotic tissue (loose, hanging) from 11-2 o'clock. The wound bed is 85% red and 15% white tissue. Wound bed:As described above Drainage (amount, consistency, odor): Small to moderate amount serous exudate. No odor. Periwound: Erythema to 1cm circumferentially. No induration, No fluctuance. Dressing procedure/placement/frequency: Patient with a history of a recent fall, so a low bed is in use rather than a mattress replacement with low air loss feature. He is to be turned side to side and since the bilateral hips are intact, this may be sufficient for pressure redistribution over the ulcer. I have provided Nursing with guidance via the Orders to keep HOB at or below a 30 degree angle and to turn from side to side. A pressure redistribution chair pad is provided for use at the SNF [facility]) when patient is OOB to the chair. Topical care will be to fill the dead space three times daily with saline moistened gauze and to top with an ABD pad and secure using paper tape.   Prevalon pressure redistribution heel boots are provided bilaterally today as patient's heels were slow to blanch upon admission last pm.  They are blanchable this morning. Conservative topical care is provided to the abrasions on the elbows and knees sustained during fall using white petrolatum (Vaseline) gauze with daily changes.  CCS could be consulted to determine if a surgical procedure could be considered. If you agree,  please order/arrange consultation.  No family at bedside.  POC discussed with Bedside RN, Gwenlyn Perking, who assisted me with the assessment and dressing change.  [facility] nursing team will not follow, but will remain available to this patient, the nursing and medical teams.  Please re-consult if needed. Thanks, Maudie Flakes, MSN, RN, [facility], Arther Abbott  Pager# [phone]

## 2018-07-07 DIAGNOSIS — C679 Malignant neoplasm of bladder, unspecified: Secondary | ICD-10-CM | POA: Diagnosis not present

## 2018-07-07 DIAGNOSIS — T83511A Infection and inflammatory reaction due to indwelling urethral catheter, initial encounter: Secondary | ICD-10-CM | POA: Diagnosis not present

## 2018-07-07 DIAGNOSIS — I480 Paroxysmal atrial fibrillation: Secondary | ICD-10-CM | POA: Diagnosis not present

## 2018-07-07 DIAGNOSIS — L89154 Pressure ulcer of sacral region, stage 4: Secondary | ICD-10-CM | POA: Diagnosis not present

## 2018-07-07 MED ORDER — MUPIROCIN 2 % EX OINT
TOPICAL_OINTMENT | Freq: Two times a day (BID) | CUTANEOUS | Status: DC
  Administered 2018-07-07 – 2018-07-10 (×8): via NASAL
  Filled 2018-07-07: qty 22

## 2018-07-07 NOTE — Plan of Care (Signed)
  Problem: Education: Goal: Knowledge of General Education information will improve Description Including pain rating scale, medication(s)/side effects and non-pharmacologic comfort measures Outcome: Progressing Note:  POC reviewed with pt.; confused and unsure how much he understands.

## 2018-07-07 NOTE — Evaluation (Signed)
Physical Therapy Evaluation Patient Details Name: [name] MRN: [id number] DOB: 09-09-1927 Today's Date: 07/07/2018   History of Present Illness  Pt is an 84 y/o male with medical history significant for HTN, CKD, dementia, prostate and bladder cancer s/p chronic indwelling foley, CHF EF 35-40%, stroke, recurrent falls and recurrent UTIs, sacral decubitus, A.Fib not on anticoagulation who presented from [facility] after an unwitnessed fall. Imaging negative for acute issues. Pt was found to have a UTI and a stage IV saral wound seen in the ED - per surgery will manage non-operatively.    Clinical Impression  Pt admitted with above diagnosis. Pt currently with functional limitations due to the deficits listed below (see PT Problem List). At the time of PT eval pt was significantly limited by pain around sacral wound. Even small shifts in weight such as lowering the foot of the bed would cause this patient to cry out in pain. Pt demonstrated the ability to assist with rolling, and +2 assist was helpful to reposition pt with pillow on the R side to off weight sacrum. PT adjusted Prevalon boots for optimal pressure relief of heels. Acutely, pt will benefit from skilled PT to increase their independence and safety with mobility to allow discharge to the venue listed below.       Follow Up Recommendations SNF;Supervision/Assistance - 24 hour    Equipment Recommendations  None recommended by PT(TBD by next venue of care)    Recommendations for Other Services       Precautions / Restrictions Precautions Precautions: Fall Precaution Comments: Several unwitnessed falls at SNF per chart review Restrictions Weight Bearing Restrictions: No          Pertinent Vitals/Pain Pain Assessment: Faces Faces Pain Scale: Hurts whole lot Pain Location: Sacral wound Pain Descriptors / Indicators: Grimacing;Guarding;Moaning Pain Intervention(s): Limited activity within patient's  tolerance;Monitored during session;Repositioned      Extremity/Trunk Assessment   Upper Extremity Assessment Upper Extremity Assessment: Defer to OT evaluation    Lower Extremity Assessment Lower Extremity Assessment: Generalized weakness(Bilaterally)    Cervical / Trunk Assessment Cervical / Trunk Assessment: (Unable to be assessed )  Communication   Communication: HOH(Severely HOH - hears better out of R ear)  Cognition Arousal/Alertness: Awake/alert   Overall Cognitive Status: History of cognitive impairments - at baseline      General Comments   Exercises    Assessment/Plan    PT Assessment Patient needs continued PT services  PT Problem List Decreased strength;Decreased range of motion;Decreased activity tolerance;Decreased balance;Decreased mobility;Decreased cognition;Decreased knowledge of use of DME;Decreased safety awareness;Decreased knowledge of precautions;Pain       PT Treatment Interventions DME instruction;Gait training;Functional mobility training;Therapeutic activities;Therapeutic exercise;Neuromuscular re-education;Patient/family education;Wheelchair mobility training    PT Goals (Current goals can be found in the Care Plan section)  Acute Rehab PT Goals Patient Stated Goal: "stop the pain" PT Goal Formulation: Patient unable to participate in goal setting Time For Goal Achievement: 07/21/18 Potential to Achieve Goals: Fair    Frequency Min 2X/week    AM-PAC PT "6 Clicks" Daily Activity  Outcome Measure Difficulty turning over in bed (including adjusting bedclothes, sheets and blankets)?: Unable Difficulty moving from lying on back to sitting on the side of the bed? : Unable Difficulty sitting down on and standing up from a chair with arms (e.g., wheelchair, bedside commode, etc,.)?: Unable Help needed moving to and from a bed to chair (including a wheelchair)?: Total Help needed walking in hospital room?: Total Help needed climbing 3-5 steps  with a railing? : Total 6 Click Score: 6    End of Session Equipment Utilized During Treatment: Oxygen Activity Tolerance: Patient limited by pain Patient left: in bed;with call bell/phone within reach;with nursing/sitter in room Nurse Communication: Mobility status(present for mobility) PT Visit Diagnosis: Repeated falls (R29.6);Muscle weakness (generalized) (M62.81)    Time: 1113-1140 PT Time Calculation (min) (ACUTE ONLY): 27 min   Charges:   PT Evaluation $PT Eval High Complexity: 1 High PT Treatments $Therapeutic Activity: 8-22 mins        Rolinda Roan, PT, DPT Acute Rehabilitation Services Pager: [phone] Office: [phone]   Thelma Comp 07/07/2018, 11:58 AM

## 2018-07-07 NOTE — Progress Notes (Signed)
Occupational Therapy Evaluation Patient Details Name: [name] MRN: [id number] DOB: Oct 11, 1926 Today's Date: 07/07/2018    History of Present Illness Pt is an 84 y/o male with medical history significant for HTN, CKD, dementia, prostate and bladder cancer s/p chronic indwelling foley, CHF EF 35-40%, stroke, recurrent falls and recurrent UTIs, sacral decubitus, A.Fib not on anticoagulation who presented from [facility] after an unwitnessed fall. Imaging negative for acute issues. Pt was found to have a UTI and a stage IV saral wound seen in the ED - per surgery will manage non-operatively.     Clinical Impression   Pt limited by pain. Requires Max to total A at this time with ADL and total A with mobility. REcommend rehab at [facility]. Pt would benefit form air mattress. All further OT to be addressed at SNF.     Follow Up Recommendations  SNF;Supervision/Assistance - 24 hour    Equipment Recommendations  None recommended by OT    Recommendations for Other Services       Precautions / Restrictions Precautions Precautions: Fall Precaution Comments: Several unwitnessed falls at SNF per chart review Restrictions Weight Bearing Restrictions: No      Mobility Bed Mobility Overal bed mobility: Needs Assistance Bed Mobility: Rolling Rolling: Mod assist         General bed mobility comments: Assisted NT with rolling while cleaning pt   Pt able to assist with grabbing rail and pulling self over Limited use of BLE.   Transfers                 General transfer comment: Unable to attempt due to severe pain     Balance                                           ADL either performed or assessed with clinical judgement   ADL Overall ADL's : Needs assistance/impaired Eating/Feeding: Minimal assistance Eating/Feeding Details (indicate cue type and reason): poor PO intake per NT Grooming: Moderate assistance Grooming Details (indicate cue type and  reason): able to wash face with cloth with hand over hand initiaiton. Afected by [facility]                             Functional mobility during ADLs: (unable to mobilize to EOB due to sacral pain at this time)       Vision         Perception     Praxis      Pertinent Vitals/Pain Pain Assessment: Faces Faces Pain Scale: Hurts whole lot Pain Location: Sacral wound Pain Descriptors / Indicators: Grimacing;Guarding;Moaning Pain Intervention(s): Limited activity within patient's tolerance     Hand Dominance     Extremity/Trunk Assessment Upper Extremity Assessment Upper Extremity Assessment: Generalized weakness   Lower Extremity Assessment Lower Extremity Assessment: Defer to PT evaluation   Cervical / Trunk Assessment Cervical / Trunk Assessment: Other exceptions(sacral wound)   Communication Communication Communication: HOH(Severely HOH - hears better out of R ear)   Cognition Arousal/Alertness: Awake/alert   Overall Cognitive Status: History of cognitive impairments - at baseline(most likely at baseline)                                     General Comments  Exercises     Shoulder Instructions      Home Living Family/patient expects to be discharged to:: Skilled nursing facility                                        Prior Functioning/Environment Level of Independence: Needs assistance  Gait / Transfers Assistance Needed: Pt reports walks with RW or uses w/c for longer distances; sits in chair most of day ADL's / Homemaking Assistance Needed: Staff assists with ADL tasks            OT Problem List: Decreased strength;Decreased range of motion;Decreased activity tolerance;Impaired balance (sitting and/or standing);Decreased coordination;Decreased cognition;Decreased safety awareness;Decreased knowledge of use of DME or AE;Pain      OT Treatment/Interventions:      OT Goals(Current goals can be found in the  care plan section) Acute Rehab OT Goals Patient Stated Goal: none stated OT Goal Formulation: Patient unable to participate in goal setting  OT Frequency:     Barriers to D/C:            Co-evaluation              AM-PAC PT "6 Clicks" Daily Activity     Outcome Measure Help from another person eating meals?: A Lot Help from another person taking care of personal grooming?: A Lot Help from another person toileting, which includes using toliet, bedpan, or urinal?: Total Help from another person bathing (including washing, rinsing, drying)?: Total Help from another person to put on and taking off regular upper body clothing?: Total Help from another person to put on and taking off regular lower body clothing?: Total 6 Click Score: 8   End of Session Nurse Communication: Mobility status(mobilize with maximove)  Activity Tolerance: Patient limited by pain Patient left: in bed;with call bell/phone within reach;with bed alarm set  OT Visit Diagnosis: Other abnormalities of gait and mobility (R26.89);Muscle weakness (generalized) (M62.81);Pain;Other symptoms and signs involving cognitive function Pain - part of body: (buttocks; BLE)                Time: 1530-1540 OT Time Calculation (min): 10 min Charges:  OT General Charges $OT Visit: 1 Visit OT Evaluation $OT Eval Low Complexity: [place], OT/L   Acute OT Clinical Specialist [place] Pager [phone] Office [phone]   [facility] 07/07/2018, 3:54 PM

## 2018-07-07 NOTE — Progress Notes (Signed)
PROGRESS NOTE  Brief Narrative: Patient is 84yo M admitted 10/20 for recurrent falls, found to have sacral decubitus with some erythema and evidence of recurrent UTI on UA. Initially with leukocytosis and hypokalemia, these improved rapidly with initial treatment. Surgery has evaluated decubitus and feels no further intervention is required, continue daily wound care. PT/OT evaluations pending, though it is suspected that he needs SNF level of care.  Subjective: Eating breakfast this morning without complaint. Slept ok. Denies pain at rest.   Objective: BP 90/68 (BP Location: Right Arm)   Pulse 88   Temp (!) 97.5 F (36.4 C) (Oral)   Resp 16   Ht 5\' 9"  (1.753 m)   Wt 73 kg   SpO2 100%   BMI 23.77 kg/m   Gen: Nontoxic Pulm: Clear and nonlabored on supplemental oxygen (at his baseline 2L)  CV: RRR, no murmur, no JVD, no edema GI: Soft, NT, ND, +BS  Neuro: Alert and oriented. No focal deficits. Skin: Sacral decubitus remains without purulence or odor, minimal surrounding erythema with mostly pink wound bed, some central slough. Prevalon boots in place bilaterally.  Assessment & Plan: The patient remains stable for discharge to SNF level of care for ongoing PT/OT and wound care. He is taking po and leukocytosis (only SIRS criteria) resolved quickly. Will continue antimicrobials and tailor to culture data/susceptibilities.  Patrecia Pour, MD 07/07/2018, 1:01 PM

## 2018-07-08 DIAGNOSIS — L03317 Cellulitis of buttock: Secondary | ICD-10-CM | POA: Diagnosis not present

## 2018-07-08 DIAGNOSIS — L89154 Pressure ulcer of sacral region, stage 4: Secondary | ICD-10-CM | POA: Diagnosis not present

## 2018-07-08 DIAGNOSIS — I5022 Chronic systolic (congestive) heart failure: Secondary | ICD-10-CM | POA: Diagnosis not present

## 2018-07-08 DIAGNOSIS — J9611 Chronic respiratory failure with hypoxia: Secondary | ICD-10-CM

## 2018-07-08 LAB — URINE CULTURE: Culture: >=100000 — AB

## 2018-07-08 MED ORDER — CEPHALEXIN 500 MG PO CAPS
500.0000 mg | ORAL_CAPSULE | Freq: Three times a day (TID) | ORAL | Status: DC
  Administered 2018-07-09 – 2018-07-10 (×6): 500 mg via ORAL
  Filled 2018-07-08 (×6): qty 1

## 2018-07-08 NOTE — Progress Notes (Signed)
PROGRESS NOTE    [name]  PZW:[id number] DOB: 1926-10-20 DOA: 07/05/2018 PCP: Tobie Lords D, FNP    Brief Narrative:  [phone] M admitted 10/20 for recurrent falls, found to have sacral decubitus with some erythema and evidence of recurrent UTI on UA. Initially with leukocytosis and hypokalemia, these improved rapidly with initial treatment. Surgery has evaluated decubitus and feels no further intervention is required, continue daily wound care. PT/OT evaluations pending, though it is suspected that he needs SNF level of care  Assessment & Plan:   Principal Problem:   Decubitus ulcer of sacral region, stage 4 ([place]) Active Problems:   Nonobstructive CAD (coronary artery disease)   Hypertension   Bladder cancer (HCC)   Atrial fibrillation (HCC)   Hyponatremia   Memory difficulty   Chronic systolic (congestive) heart failure (HCC)   Catheter-associated urinary tract infection ([place])   Chronic respiratory failure with hypoxia (HCC)   Leukocytosis   Hypokalemia   Cellulitis of buttock  Stage 4 sacral decubitus ulcer with cellulitis: No purulence noted, will treat empirically for SSTI though there is limited erythema around wound. Do not believe further imaging would be helpful. Surgery was kind enough to consult and agrees that no further intervention is warranted. Has remained afebrile.  - Continued con antibiotic, discussed with pharmacy. Transition to keflex  CAUTI, likely contaminant, ruled out.  - Ressitant Acinetobacter on culture result  Hypokalemia: Recurrent problem, resolved with replacement.  - Continue with potassium replacement as needed  Chronic A.Fib: Rate controlled. Not on anticoagulation due to frequent falls. No changes in management indicated.  -Stable at present  Unwitnessed fall: No dislocations or fractures on extensive imaging. Recurrent issue. - Continue PT/OT at SNF.   Chronic hypoxic respiratory failure:  - Continue home 2L O2  Chronic  systolic CHF: Euvolemic, restart lasix.  DVT prophylaxis: Lovenox subQ Code Status: Partial code Family Communication: Pt in room, family not at bedside Disposition Plan: SNF, pending   Consultants:   General Surgery  Procedures:     Antimicrobials: Anti-infectives (From admission, onward)   Start     Dose/Rate Route Frequency Ordered Stop   07/09/18 0600  cephALEXin (KEFLEX) capsule 500 mg     500 mg Oral Every 8 hours 07/08/18 1242     07/07/18 1033  ceFEPIme (MAXIPIME) 2 g in sodium chloride 0.9 % 100 mL IVPB  Status:  Discontinued     2 g 200 mL/hr over 30 Minutes Intravenous Every 24 hours 07/06/18 1310 07/08/18 1242   07/06/18 1100  vancomycin (VANCOCIN) IVPB 750 mg/150 ml premix  Status:  Discontinued     750 mg 150 mL/hr over 60 Minutes Intravenous Every 12 hours 07/05/18 2153 07/07/18 1310   07/06/18 1000  ceFEPIme (MAXIPIME) 2 g in sodium chloride 0.9 % 100 mL IVPB  Status:  Discontinued     2 g 200 mL/hr over 30 Minutes Intravenous Every 12 hours 07/05/18 2153 07/06/18 1310   07/06/18 0000  cefdinir (OMNICEF) 300 MG capsule     300 mg Oral 2 times daily 07/06/18 1355     07/05/18 2130  ceFEPIme (MAXIPIME) 2 g in sodium chloride 0.9 % 100 mL IVPB     2 g 200 mL/hr over 30 Minutes Intravenous  Once 07/05/18 2115 07/06/18 0257   07/05/18 2130  vancomycin (VANCOCIN) IVPB 1000 mg/200 mL premix     1,000 mg 200 mL/hr over 60 Minutes Intravenous  Once 07/05/18 2115 07/06/18 0411       Subjective:  No complaints  Objective: Vitals:   07/07/18 1700 07/07/18 2145 07/08/18 0447 07/08/18 0830  BP: 95/71 (!) 98/57 (!) 138/115 100/65  Pulse: 79 78 81 86  Resp: 16 14 12 15   Temp: 97.6 F (36.4 C) 97.7 F (36.5 C) 97.6 F (36.4 C) 97.6 F (36.4 C)  TempSrc: Oral Oral Oral Oral  SpO2: 100%  100% 100%  Weight:      Height:        Intake/Output Summary (Last 24 hours) at 07/08/2018 1803 Last data filed at 07/08/2018 1600 Gross per 24 hour  Intake 613.92 ml    Output 1100 ml  Net -486.08 ml   Filed Weights   07/05/18 1736  Weight: 73 kg    Examination:  General exam: Appears calm and comfortable  Respiratory system: Clear to auscultation. Respiratory effort normal. Cardiovascular system: S1 & S2 heard, RRR Gastrointestinal system: Abdomen is nondistended, soft and nontender. No organomegaly or masses felt. Normal bowel sounds heard. Central nervous system: Alert and oriented. No focal neurological deficits. Extremities: Symmetric 5 x 5 power. Skin: No rashes, lesions Psychiatry: Judgement and insight appear normal. Mood & affect appropriate.   Data Reviewed: I have personally reviewed following labs and imaging studies  CBC: Recent Labs  Lab 07/05/18 1803 07/06/18 0438  WBC 19.0* 9.7  NEUTROABS 13.8*  --   HGB 12.7* 13.4  HCT 40.6 43.6  MCV 83.0 86.0  PLT 467* 992*   Basic Metabolic Panel: Recent Labs  Lab 07/05/18 1803 07/05/18 2019 07/06/18 0438  NA 131*  --  132*  K 2.5*  --  3.9  CL 85*  --  91*  CO2 33*  --  24  GLUCOSE 134*  --  137*  BUN 13  --  14  CREATININE 1.06  --  1.05  CALCIUM 8.7*  --  8.7*  MG  --  2.1  --    GFR: Estimated Creatinine Clearance: 47.7 mL/min (by C-G formula based on SCr of 1.05 mg/dL). Liver Function Tests: No results for input(s): AST, ALT, ALKPHOS, BILITOT, PROT, ALBUMIN in the last 168 hours. No results for input(s): LIPASE, AMYLASE in the last 168 hours. No results for input(s): AMMONIA in the last 168 hours. Coagulation Profile: No results for input(s): INR, PROTIME in the last 168 hours. Cardiac Enzymes: No results for input(s): CKTOTAL, CKMB, CKMBINDEX, TROPONINI in the last 168 hours. BNP (last 3 results) No results for input(s): PROBNP in the last 8760 hours. HbA1C: No results for input(s): HGBA1C in the last 72 hours. CBG: No results for input(s): GLUCAP in the last 168 hours. Lipid Profile: No results for input(s): CHOL, HDL, LDLCALC, TRIG, CHOLHDL, LDLDIRECT in  the last 72 hours. Thyroid Function Tests: No results for input(s): TSH, T4TOTAL, FREET4, T3FREE, THYROIDAB in the last 72 hours. Anemia Panel: No results for input(s): VITAMINB12, FOLATE, FERRITIN, TIBC, IRON, RETICCTPCT in the last 72 hours. Sepsis Labs: Recent Labs  Lab 07/05/18 2308  PROCALCITON 0.10    Recent Results (from the past 240 hour(s))  Blood culture (routine x 2)     Status: None (Preliminary result)   Collection Time: 07/05/18  5:40 PM  Result Value Ref Range Status   Specimen Description BLOOD RIGHT FOREARM  Final   Special Requests   Final    BOTTLES DRAWN AEROBIC AND ANAEROBIC Blood Culture adequate volume   Culture   Final    NO GROWTH 3 DAYS Performed at [facility], [address],  [place] [address]    Report Status PENDING  Incomplete  Aerobic/Anaerobic Culture (surgical/deep wound)     Status: None (Preliminary result)   Collection Time: 07/05/18  9:18 PM  Result Value Ref Range Status   Specimen Description SACRAL  Final   Special Requests NONE  Final   Gram Stain   Final    FEW WBC PRESENT, PREDOMINANTLY PMN FEW GRAM POSITIVE COCCI    Culture   Final    FEW PROTEUS MIRABILIS HOLDING FOR POSSIBLE ANAEROBE Performed at [facility], [place] [address], [place], [place] [address]    Report Status PENDING  Incomplete   Organism ID, Bacteria PROTEUS MIRABILIS  Final      Susceptibility   Proteus mirabilis - MIC*    AMPICILLIN <=2 SENSITIVE Sensitive     CEFAZOLIN <=4 SENSITIVE Sensitive     CEFEPIME <=1 SENSITIVE Sensitive     CEFTAZIDIME <=1 SENSITIVE Sensitive     CEFTRIAXONE <=1 SENSITIVE Sensitive     CIPROFLOXACIN >=4 RESISTANT Resistant     GENTAMICIN <=1 SENSITIVE Sensitive     IMIPENEM 2 SENSITIVE Sensitive     TRIMETH/SULFA >=320 RESISTANT Resistant     AMPICILLIN/SULBACTAM <=2 SENSITIVE Sensitive     PIP/TAZO <=4 SENSITIVE Sensitive     * FEW PROTEUS MIRABILIS  Urine culture     Status: Abnormal   Collection Time:  07/05/18 10:31 PM  Result Value Ref Range Status   Specimen Description URINE, RANDOM  Final   Special Requests   Final    NONE Performed at [facility], [place] [address] [place], [place] [address]    Culture (A)  Final    >=100,000 COLONIES/mL ACINETOBACTER CALCOACETICUS/BAUMANNII COMPLEX   Report Status 07/08/2018 FINAL  Final   Organism ID, Bacteria ACINETOBACTER CALCOACETICUS/BAUMANNII COMPLEX (A)  Final      Susceptibility   Acinetobacter calcoaceticus/baumannii complex - MIC*    CEFTAZIDIME >=64 RESISTANT Resistant     CEFTRIAXONE >=64 RESISTANT Resistant     CIPROFLOXACIN >=4 RESISTANT Resistant     GENTAMICIN <=1 SENSITIVE Sensitive     IMIPENEM 0.5 SENSITIVE Sensitive     PIP/TAZO >=128 RESISTANT Resistant     TRIMETH/SULFA <=20 SENSITIVE Sensitive     CEFEPIME RESISTANT Resistant     AMPICILLIN/SULBACTAM 4 SENSITIVE Sensitive     * >=100,000 COLONIES/mL ACINETOBACTER CALCOACETICUS/BAUMANNII COMPLEX  Blood culture (routine x 2)     Status: None (Preliminary result)   Collection Time: 07/05/18 11:08 PM  Result Value Ref Range Status   Specimen Description BLOOD RIGHT HAND  Final   Special Requests   Final    BOTTLES DRAWN AEROBIC ONLY Blood Culture results may not be optimal due to an inadequate volume of blood received in culture bottles   Culture   Final    NO GROWTH 2 DAYS Performed at [facility], [place] [address] [place], [place] [address]    Report Status PENDING  Incomplete  MRSA PCR Screening     Status: Abnormal   Collection Time: 07/05/18 11:43 PM  Result Value Ref Range Status   MRSA by PCR POSITIVE (A) NEGATIVE Final    Comment:        The GeneXpert MRSA Assay (FDA approved for NASAL specimens only), is one component of a comprehensive MRSA colonization surveillance program. It is not intended to diagnose MRSA infection nor to guide or monitor treatment for MRSA infections. RESULT CALLED TO, READ BACK BY AND VERIFIED WITH: [facility] RN  07/06/18 AT [address] [facility]      Radiology Studies: No results found.  Scheduled Meds: . [START ON 07/09/2018] cephALEXin  500 mg Oral Q8H  . enoxaparin (LOVENOX) injection  40 mg Subcutaneous Q24H  . mupirocin ointment   Nasal BID   Continuous Infusions:   LOS: 0 days   Marylu Lund, MD [facility] Pager On Amion  If 7PM-7AM, please contact night-coverage 07/08/2018, 6:03 PM

## 2018-07-08 NOTE — NC FL2 (Signed)
MEDICAID FL2 LEVEL OF CARE SCREENING TOOL     IDENTIFICATION  Patient Name: LANELL DUBIE Birthdate: January 05, 1927 Sex: male Admission Date (Current Location): 07/05/2018  [facility] [place] Number:  Herbalist and Address:  [facility] [place], [place] [address] [place], [place] [address]      Provider Number: [id number]  Attending Physician Name and Address:  Donne Hazel, MD  Relative Name and Phone Number:  Lamarcus Spira - wife, [phone]    Current Level of Care: Hospital Recommended Level of Care: [place] Prior Approval Number:    Date Approved/Denied:   PASRR Number: [id number](01/13/18)  Discharge Plan: SNF    Current Diagnoses: Patient Active Problem List   Diagnosis Date Noted  . Leukocytosis 07/05/2018  . Hypokalemia 07/05/2018  . Cellulitis of buttock 07/05/2018  . Pressure injury of skin 04/23/2018  . Chronic respiratory failure with hypoxia ([place]) 04/23/2018  . Decubitus ulcer of sacral region, stage 4 ([place]) 04/23/2018  . UTI (urinary tract infection), bacterial/CAUTI/Pseudomonas 04/22/2018  . Metabolic encephalopathy 09/38/1829  . Catheter-associated urinary tract infection ([place]) 04/22/2018  . E. coli UTI (urinary tract infection) 03/10/2018  . Acute metabolic encephalopathy 93/71/6967  . Acute kidney injury ([place]) 03/10/2018  . Chronic systolic (congestive) heart failure ([place]) 03/10/2018  . Pneumonia 02/24/2018  . Pleural effusion on right 12/05/2017  . Acute respiratory failure with hypoxia ([place]) 12/05/2017  . Memory difficulty 05/26/2017  . Gait abnormality 05/26/2017  . Constipation 01/03/2017  . Atrial fibrillation ([place]) 01/03/2017  . Hyponatremia 01/03/2017  . First degree AV block 06/28/2013  . Bladder cancer ([place]) 07/01/2012  . Cataracts, bilateral 03/14/2011  . Ventricular bigeminy   . Nonsustained ventricular tachycardia ([place])   . Nonobstructive CAD (coronary artery disease)    . Hypertension   . Hyperlipidemia     Orientation RESPIRATION BLADDER Height & Weight     Self  Normal Indwelling catheter(Urethral catheter placed 05/28/18) Weight: 160 lb 15 oz (73 kg) Height:  5\' 9"  (175.3 cm)  BEHAVIORAL SYMPTOMS/MOOD NEUROLOGICAL BOWEL NUTRITION STATUS      Incontinent Diet(Low sodium - Heart healthy)  AMBULATORY STATUS COMMUNICATION OF NEEDS Skin   Total Care(Patient was unable to ambulate with PT during eval on 10/22) Verbally Other (Comment)(Unstageable pressure injury to sacrum (05/28/18-1st noted) with gauze/foam dressing;  Stage 1 pressure injury to right/left hells with prevalon boots; Unstageable pressure injury to vertebral column-1st noted 8/7/19e)                       Personal Care Assistance Level of Assistance  Bathing, Feeding, Dressing Bathing Assistance: Maximum assistance Feeding assistance: Limited assistance Dressing Assistance: Maximum assistance     Functional Limitations Info  Sight, Hearing, Speech Sight Info: Impaired Hearing Info: Impaired Speech Info: Adequate    SPECIAL CARE FACTORS FREQUENCY  PT (By licensed PT), OT (By licensed OT)     PT Frequency: Evaluated 10/22 OT Frequency: Evaluated 10/22            Contractures Contractures Info: Not present    Additional Factors Info  Code Status, Allergies Code Status Info: Partial Allergies Info: Neosporin, Penicillins, Penicillin G           Current Medications (07/08/2018):  This is the current hospital active medication list Current Facility-Administered Medications  Medication Dose Route Frequency Provider Last Rate Last Dose  . acetaminophen (TYLENOL) tablet 650 mg  650 mg Oral Q6H PRN Etta Quill, DO  650 mg at 07/07/18 0534   Or  . acetaminophen (TYLENOL) suppository 650 mg  650 mg Rectal Q6H PRN Etta Quill, DO      . [START ON 07/09/2018] cephALEXin (KEFLEX) capsule 500 mg  500 mg Oral Q8H Donne Hazel, MD      . enoxaparin (LOVENOX)  injection 40 mg  40 mg Subcutaneous Q24H Jennette Kettle M, DO   40 mg at 07/08/18 1136  . morphine 2 MG/ML injection 2 mg  2 mg Intravenous Q4H PRN Etta Quill, DO   2 mg at 07/07/18 1015  . mupirocin ointment (BACTROBAN) 2 %   Nasal BID Patrecia Pour, MD      . ondansetron [facility]) tablet 4 mg  4 mg Oral Q6H PRN Etta Quill, DO       Or  . ondansetron [facility]) injection 4 mg  4 mg Intravenous Q6H PRN Etta Quill, DO         Discharge Medications: Please see discharge summary for a list of discharge medications.  Relevant Imaging Results:  Relevant Lab Results:   Additional Information ss#[id number]  Sable Feil, LCSW

## 2018-07-08 NOTE — Clinical Social Work Note (Signed)
Clinical Social Work Assessment  Patient Details  Name: [name] MRN: [id number] Date of Birth: 02-05-1927  Date of referral:  07/07/18               Reason for consult:  Facility Placement, Discharge Planning                Permission sought to share information with:  Family Supports Permission granted to share information::  No(Patient oriented to self only)  Name::        Agency::     Relationship::     Contact Information:     Housing/Transportation Living arrangements for the past 2 months:  Single Family Home Source of Information:  Spouse Patient Interpreter Needed:  None Criminal Activity/Legal Involvement Pertinent to Current Situation/Hospitalization:  No - Comment as needed Significant Relationships:  Spouse Lives with:  Facility Resident(Patient lived at [facility]) Do you feel safe going back to the place where you live?  No(Wife in agreement with rehab at discharge) Need for family participation in patient care:  Yes (Comment)  Care giving concerns:  CSW talked with patient's wife, Jamere Stidham [phone]) regarding discharge disposition for patient. [name] in agreement with ST rehab, but needs a facility that can take patient LTC (private pay) after rehab. CSW informed today by [name] and [name] at [facility] that due to patient's wounds and the care they require, patient cannot return to ALF after ST rehab., Per [name], they are not equipped to provide the level of wound care patient needs.  Social Worker assessment / plan:  CSW talked with [name] regarding the discharge plan for her husband. She expressed agreement with ST rehab for patient but expressed concern regarding where he will go after rehab as she had been informed that patient cannot return to the Assisted Living facility as they cannot manage his wounds at this time. CSW was provided with her facility preferences and requested to know if they can provide a LTC bed once rehab  done and also the cost for LTC.    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  Patient/Family's Understanding of and Emotional Response to Diagnosis, Current Treatment, and Prognosis:  Wife expressed awareness regarding her husband's current medical status and his wounds. Her concerns are finding a facility that can accept patient for rehab and LTC once rehab completed. [name] requested CSW's assistance in getting information from her facility preferences regarding availability of a LTC bed and the cost of LTC.  Emotional Assessment Appearance:  Appears stated age(CSW visited room but did not engage patient as he is not oriented.) Attitude/Demeanor/Rapport:  Unable to Assess Affect (typically observed):  Unable to Assess Orientation:  Oriented to Self Alcohol / Substance use:  Tobacco Use, Alcohol Use, Illicit Drugs Psych involvement (Current and /or in the community):  No (Comment)  Discharge Needs  Concerns to be addressed:  Discharge Planning Concerns Readmission within the last 30 days:  No Current discharge risk:  None Barriers to Discharge:  No SNF bed, Other(Patient no longer appropriate for ALF due to wounds. Waiting to hear from [facility] to determine if patient can transition to a LTC private pay bed after ST rehab.)   Christopher Feil, LCSW 07/08/2018, 5:07  PM

## 2018-07-08 NOTE — Clinical Social Work Placement (Signed)
   CLINICAL SOCIAL WORK PLACEMENT  NOTE *SNF bed not confirmed as of 5:25 pm on 07/08/18 - See comments section below. *Initiated authorization request 10/23.  Date:  07/08/2018  Patient Details  Name: [name] MRN: [id number] Date of Birth: March 14, 1927  Clinical Social Work is seeking post-discharge placement for this patient at the [place] level of care (*CSW will initial, date and re-position this form in  chart as items are completed):  No(CSW provided with wife's facility preferences)   Patient/family provided with [facility] Work Department's list of facilities offering this level of care within the geographic area requested by the patient (or if unable, by the patient's family).  Yes   Patient/family informed of their freedom to choose among providers that offer the needed level of care, that participate in Medicare, Medicaid or managed care program needed by the patient, have an available bed and are willing to accept the patient.  Yes   Patient/family informed of [facility]'s ownership interest in [facility] and [facility], as well as of the fact that they are under no obligation to receive care at these facilities.  PASRR submitted to EDS on       PASRR number received on       Existing PASRR number confirmed on 07/08/18     FL2 transmitted to all facilities in geographic area requested by pt/family on 07/08/18     FL2 transmitted to all facilities within larger geographic area on       Patient informed that his/her managed care company has contracts with or will negotiate with certain facilities, including the following:        Yes(Facility preferences are: [facility], [facility], [facility])   Patient/family informed of bed offers received.  Patient chooses bed at       Physician recommends and patient chooses bed at      Patient to be transferred to   on  .  Patient to be transferred to facility by       Patient  family notified on   of transfer.  Name of family member notified:        PHYSICIAN      Additional Comment:  07/08/18 -[facility] has no male rehab beds.                 [facility]: has no LTC beds. Private pay is $265 per day (private room), $230 per day for                          a semi-private room.                 [facility]: Left message for admissions director.    _______________________________________________ Sable Feil, LCSW 07/08/2018, 5:22 PM

## 2018-07-09 DIAGNOSIS — L03317 Cellulitis of buttock: Secondary | ICD-10-CM | POA: Diagnosis not present

## 2018-07-09 DIAGNOSIS — L89154 Pressure ulcer of sacral region, stage 4: Secondary | ICD-10-CM | POA: Diagnosis not present

## 2018-07-09 MED ORDER — KETOROLAC TROMETHAMINE 15 MG/ML IJ SOLN
15.0000 mg | Freq: Four times a day (QID) | INTRAMUSCULAR | Status: DC | PRN
  Administered 2018-07-09: 15 mg via INTRAVENOUS
  Filled 2018-07-09: qty 1

## 2018-07-09 MED ORDER — MAGNESIUM CITRATE PO SOLN
1.0000 | Freq: Once | ORAL | Status: AC
  Administered 2018-07-09: 1 via ORAL
  Filled 2018-07-09: qty 296

## 2018-07-09 NOTE — Clinical Social Work Note (Signed)
CSW received approval from April with [employer] for patient to discharge to [facility] for [place] rehab. Authorization information: Josem Kaufmann [id number], effective 10/24 for 3 days. Next review date is 10/26; updates to be sent to Josephina Gip, phone # [phone], fax [phone]. Patient approved for 483 therapy minutes per week. CSW advised later that patient unable to discharge today as he has not had a bowel movement in several days and must have one before he can discharge. Admissions director at [facility] and wife updated. CSW will continue to follow and facilitate discharge to [facility] when medically stable.  Rhodie Cienfuegos Givens, MSW, LCSW Licensed Clinical Social Worker [place] [phone]

## 2018-07-09 NOTE — Progress Notes (Signed)
PROGRESS NOTE    [name]  ZOX:[id number] DOB: 1927-04-27 DOA: 07/05/2018 PCP: Tobie Lords D, FNP    Brief Narrative:  [phone] M admitted 10/20 for recurrent falls, found to have sacral decubitus with some erythema and evidence of recurrent UTI on UA. Initially with leukocytosis and hypokalemia, these improved rapidly with initial treatment. Surgery has evaluated decubitus and feels no further intervention is required, continue daily wound care. PT/OT evaluations pending, though it is suspected that he needs SNF level of care  Assessment & Plan:   Principal Problem:   Decubitus ulcer of sacral region, stage 4 ([place]) Active Problems:   Nonobstructive CAD (coronary artery disease)   Hypertension   Bladder cancer (HCC)   Atrial fibrillation (HCC)   Hyponatremia   Memory difficulty   Chronic systolic (congestive) heart failure (HCC)   Catheter-associated urinary tract infection ([place])   Chronic respiratory failure with hypoxia (HCC)   Leukocytosis   Hypokalemia   Cellulitis of buttock  Stage 4 sacral decubitus ulcer with cellulitis: No purulence noted, will treat empirically for SSTI though there is limited erythema around wound. Do not believe further imaging would be helpful. Surgery was kind enough to consult and agrees that no further intervention is warranted. Has remained afebrile.  - Continued con antibiotic, discussed with pharmacy. Transitioned to keflex  CAUTI, likely contaminant, ruled out.  - Ressitant Acinetobacter on culture result  Hypokalemia: Recurrent problem, resolved with replacement.  - Continue with potassium replacement as needed  Chronic A.Fib: Rate controlled. Not on anticoagulation due to frequent falls. No changes in management indicated.  -Stable at present  Unwitnessed fall: No dislocations or fractures on extensive imaging. Recurrent issue. - Continue PT/OT at SNF.   Chronic hypoxic respiratory failure:  - Continue home 2L  O2  Chronic systolic CHF: Euvolemic, to restart lasix.  DVT prophylaxis: Lovenox subQ Code Status: Partial code Family Communication: Pt in room, family not at bedside Disposition Plan: SNF, pending   Consultants:   General Surgery  Procedures:     Antimicrobials: Anti-infectives (From admission, onward)   Start     Dose/Rate Route Frequency Ordered Stop   07/09/18 0600  cephALEXin (KEFLEX) capsule 500 mg     500 mg Oral Every 8 hours 07/08/18 1242     07/07/18 1033  ceFEPIme (MAXIPIME) 2 g in sodium chloride 0.9 % 100 mL IVPB  Status:  Discontinued     2 g 200 mL/hr over 30 Minutes Intravenous Every 24 hours 07/06/18 1310 07/08/18 1242   07/06/18 1100  vancomycin (VANCOCIN) IVPB 750 mg/150 ml premix  Status:  Discontinued     750 mg 150 mL/hr over 60 Minutes Intravenous Every 12 hours 07/05/18 2153 07/07/18 1310   07/06/18 1000  ceFEPIme (MAXIPIME) 2 g in sodium chloride 0.9 % 100 mL IVPB  Status:  Discontinued     2 g 200 mL/hr over 30 Minutes Intravenous Every 12 hours 07/05/18 2153 07/06/18 1310   07/06/18 0000  cefdinir (OMNICEF) 300 MG capsule     300 mg Oral 2 times daily 07/06/18 1355     07/05/18 2130  ceFEPIme (MAXIPIME) 2 g in sodium chloride 0.9 % 100 mL IVPB     2 g 200 mL/hr over 30 Minutes Intravenous  Once 07/05/18 2115 07/06/18 0257   07/05/18 2130  vancomycin (VANCOCIN) IVPB 1000 mg/200 mL premix     1,000 mg 200 mL/hr over 60 Minutes Intravenous  Once 07/05/18 2115 07/06/18 0411      Subjective:  Without complaints  Objective: Vitals:   07/08/18 2042 07/09/18 0631 07/09/18 0952 07/09/18 1717  BP: 96/64 108/65 101/67 103/77  Pulse: 83 (!) 47 72 81  Resp: 16 18 16 18   Temp: 98.2 F (36.8 C) 98 F (36.7 C) 98 F (36.7 C) 97.7 F (36.5 C)  TempSrc: Oral  Oral Oral  SpO2: 100% 98% 98% 100%  Weight:      Height:        Intake/Output Summary (Last 24 hours) at 07/09/2018 1808 Last data filed at 07/09/2018 1305 Gross per 24 hour  Intake 430  ml  Output 350 ml  Net 80 ml   Filed Weights   07/05/18 1736  Weight: 73 kg    Examination: General exam: Conversant, in no acute distress Respiratory system: normal chest rise, clear, no audible wheezing  Data Reviewed: I have personally reviewed following labs and imaging studies  CBC: Recent Labs  Lab 07/05/18 1803 07/06/18 0438  WBC 19.0* 9.7  NEUTROABS 13.8*  --   HGB 12.7* 13.4  HCT 40.6 43.6  MCV 83.0 86.0  PLT 467* 983*   Basic Metabolic Panel: Recent Labs  Lab 07/05/18 1803 07/05/18 2019 07/06/18 0438  NA 131*  --  132*  K 2.5*  --  3.9  CL 85*  --  91*  CO2 33*  --  24  GLUCOSE 134*  --  137*  BUN 13  --  14  CREATININE 1.06  --  1.05  CALCIUM 8.7*  --  8.7*  MG  --  2.1  --    GFR: Estimated Creatinine Clearance: 47.7 mL/min (by C-G formula based on SCr of 1.05 mg/dL). Liver Function Tests: No results for input(s): AST, ALT, ALKPHOS, BILITOT, PROT, ALBUMIN in the last 168 hours. No results for input(s): LIPASE, AMYLASE in the last 168 hours. No results for input(s): AMMONIA in the last 168 hours. Coagulation Profile: No results for input(s): INR, PROTIME in the last 168 hours. Cardiac Enzymes: No results for input(s): CKTOTAL, CKMB, CKMBINDEX, TROPONINI in the last 168 hours. BNP (last 3 results) No results for input(s): PROBNP in the last 8760 hours. HbA1C: No results for input(s): HGBA1C in the last 72 hours. CBG: No results for input(s): GLUCAP in the last 168 hours. Lipid Profile: No results for input(s): CHOL, HDL, LDLCALC, TRIG, CHOLHDL, LDLDIRECT in the last 72 hours. Thyroid Function Tests: No results for input(s): TSH, T4TOTAL, FREET4, T3FREE, THYROIDAB in the last 72 hours. Anemia Panel: No results for input(s): VITAMINB12, FOLATE, FERRITIN, TIBC, IRON, RETICCTPCT in the last 72 hours. Sepsis Labs: Recent Labs  Lab 07/05/18 2308  PROCALCITON 0.10    Recent Results (from the past 240 hour(s))  Blood culture (routine x 2)      Status: None (Preliminary result)   Collection Time: 07/05/18  5:40 PM  Result Value Ref Range Status   Specimen Description BLOOD RIGHT FOREARM  Final   Special Requests   Final    BOTTLES DRAWN AEROBIC AND ANAEROBIC Blood Culture adequate volume   Culture   Final    NO GROWTH 4 DAYS Performed at [facility], [address] [place],  [address]    Report Status PENDING  Incomplete  Aerobic/Anaerobic Culture (surgical/deep wound)     Status: None (Preliminary result)   Collection Time: 07/05/18  9:18 PM  Result Value Ref Range Status   Specimen Description SACRAL  Final   Special Requests NONE  Final   Gram Stain  Final    FEW WBC PRESENT, PREDOMINANTLY PMN FEW GRAM POSITIVE COCCI    Culture   Final    FEW PROTEUS MIRABILIS HOLDING FOR POSSIBLE ANAEROBE Performed at [facility], [place] [address], [place], [place] [address]    Report Status PENDING  Incomplete   Organism ID, Bacteria PROTEUS MIRABILIS  Final      Susceptibility   Proteus mirabilis - MIC*    AMPICILLIN <=2 SENSITIVE Sensitive     CEFAZOLIN <=4 SENSITIVE Sensitive     CEFEPIME <=1 SENSITIVE Sensitive     CEFTAZIDIME <=1 SENSITIVE Sensitive     CEFTRIAXONE <=1 SENSITIVE Sensitive     CIPROFLOXACIN >=4 RESISTANT Resistant     GENTAMICIN <=1 SENSITIVE Sensitive     IMIPENEM 2 SENSITIVE Sensitive     TRIMETH/SULFA >=320 RESISTANT Resistant     AMPICILLIN/SULBACTAM <=2 SENSITIVE Sensitive     PIP/TAZO <=4 SENSITIVE Sensitive     * FEW PROTEUS MIRABILIS  Urine culture     Status: Abnormal   Collection Time: 07/05/18 10:31 PM  Result Value Ref Range Status   Specimen Description URINE, RANDOM  Final   Special Requests   Final    NONE Performed at [facility], [place] [address] [place], [place] [address]    Culture (A)  Final    >=100,000 COLONIES/mL ACINETOBACTER CALCOACETICUS/BAUMANNII COMPLEX   Report Status 07/08/2018 FINAL  Final   Organism ID, Bacteria ACINETOBACTER  CALCOACETICUS/BAUMANNII COMPLEX (A)  Final      Susceptibility   Acinetobacter calcoaceticus/baumannii complex - MIC*    CEFTAZIDIME >=64 RESISTANT Resistant     CEFTRIAXONE >=64 RESISTANT Resistant     CIPROFLOXACIN >=4 RESISTANT Resistant     GENTAMICIN <=1 SENSITIVE Sensitive     IMIPENEM 0.5 SENSITIVE Sensitive     PIP/TAZO >=128 RESISTANT Resistant     TRIMETH/SULFA <=20 SENSITIVE Sensitive     CEFEPIME RESISTANT Resistant     AMPICILLIN/SULBACTAM 4 SENSITIVE Sensitive     * >=100,000 COLONIES/mL ACINETOBACTER CALCOACETICUS/BAUMANNII COMPLEX  Blood culture (routine x 2)     Status: None (Preliminary result)   Collection Time: 07/05/18 11:08 PM  Result Value Ref Range Status   Specimen Description BLOOD RIGHT HAND  Final   Special Requests   Final    BOTTLES DRAWN AEROBIC ONLY Blood Culture results may not be optimal due to an inadequate volume of blood received in culture bottles   Culture   Final    NO GROWTH 3 DAYS Performed at [facility], [address], [place], [place] [address]    Report Status PENDING  Incomplete  MRSA PCR Screening     Status: Abnormal   Collection Time: 07/05/18 11:43 PM  Result Value Ref Range Status   MRSA by PCR POSITIVE (A) NEGATIVE Final    Comment:        The GeneXpert MRSA Assay (FDA approved for NASAL specimens only), is one component of a comprehensive MRSA colonization surveillance program. It is not intended to diagnose MRSA infection nor to guide or monitor treatment for MRSA infections. RESULT CALLED TO, READ BACK BY AND VERIFIED WITH: [facility] RN 07/06/18 AT 0123 SKEEN,P      Radiology Studies: No results found.  Scheduled Meds: . cephALEXin  500 mg Oral Q8H  . enoxaparin (LOVENOX) injection  40 mg Subcutaneous Q24H  . mupirocin ointment   Nasal BID   Continuous Infusions:   LOS: 0 days   Marylu Lund, MD [facility]  Pager On Amion  If 7PM-7AM, please contact night-coverage 07/09/2018, 6:08 PM

## 2018-07-10 DIAGNOSIS — J9611 Chronic respiratory failure with hypoxia: Secondary | ICD-10-CM | POA: Diagnosis not present

## 2018-07-10 DIAGNOSIS — L89154 Pressure ulcer of sacral region, stage 4: Secondary | ICD-10-CM | POA: Diagnosis not present

## 2018-07-10 LAB — CULTURE, BLOOD (ROUTINE X 2)
CULTURE: NO GROWTH
Special Requests: ADEQUATE

## 2018-07-10 MED ORDER — CEPHALEXIN 500 MG PO CAPS: 500.0000 mg | ORAL_CAPSULE | Freq: Three times a day (TID) | ORAL | 0 refills | Status: AC

## 2018-07-10 NOTE — Progress Notes (Signed)
Report called to Peggye Fothergill, nurse at [facility]

## 2018-07-10 NOTE — Clinical Social Work Note (Signed)
CSW advised that patient not medically stable today. [name], admissions director at [facility] contacted and informed. CSW advised that Mrs. Begay coming to the facility at 2 pm to do admissions paperwork. [name] informed that weekend CSW will be updated so that discharge can be facilitated if patient ready for discharge. Authorization rec'd from [facility]. CSW will continue to follow, provide SW intervention services and facilitate discharge to [facility] when medically stable.  Satine Hausner Givens, MSW, LCSW Licensed Clinical Social Worker [place] [phone]

## 2018-07-10 NOTE — Discharge Summary (Signed)
Physician Discharge Summary  Halim Surrette Sanderfer WJX:[id number] DOB: [birth year] DOA: 07/05/2018  PCP: Gregor Hams, FNP  Admit date: 07/05/2018 Discharge date: 07/10/2018  Admitted From: SNF Disposition:  SNF  Recommendations for Outpatient Follow-up:  1. Follow up with PCP in 1-2 weeks 2. Complete 2 more days of keflex  Discharge Condition:Improved CODE STATUS:NO CPR, No ACLS medications, no cardioversion. Intubation and Bipap OK Diet recommendation: Heart healthy   Brief/Interim Summary: 84yo M admitted 10/20 for recurrent falls, found to have sacral decubitus with some erythema and evidence of recurrent UTI on UA. Initially with leukocytosis and hypokalemia, these improved rapidly with initial treatment. Surgery has evaluated decubitus and feels no further intervention is required, continue daily wound care. PT/OT evaluations pending, though it is suspected that he needs SNF level of care  Stage 4 sacral decubitusulcerwith cellulitis: No purulence noted, will treat empirically for SSTI though there is limited erythema around wound. Do not believe further imaging would be helpful. Surgery was kind enough to consult and agrees that no further intervention is warranted. Has remained afebrile.  - Continued on antibiotic, to complete 2 more days of keflex on discharge  CAUTI, likely contaminant, ruled out.  - Ressitant Acinetobacter on culture result  Hypokalemia: Recurrent problem, resolved with replacement.  - Continue with potassium replacement as needed  ChronicA.Fib: Rate controlled. Not on anticoagulation due to frequent falls. No changes in management indicated.  -Stable at present  Unwitnessed fall: No dislocations or fractures on extensive imaging. Recurrent issue. - Continue PT/OT at SNF.   Chronic hypoxic respiratory failure:  - Continue home 2L O2  Chronic systolic CHF: Euvolemic, to restart lasix.   Discharge Diagnoses:  Principal Problem:   Decubitus  ulcer of sacral region, stage 4 ([place]) Active Problems:   Nonobstructive CAD (coronary artery disease)   Hypertension   Bladder cancer (HCC)   Atrial fibrillation (HCC)   Hyponatremia   Memory difficulty   Chronic systolic (congestive) heart failure (HCC)   Catheter-associated urinary tract infection (HCC)   Chronic respiratory failure with hypoxia (HCC)   Leukocytosis   Hypokalemia   Cellulitis of buttock    Discharge Instructions  Discharge Instructions    Diet - low sodium heart healthy   Complete by:  As directed    Increase activity slowly   Complete by:  As directed      Allergies as of 07/10/2018      Reactions   Neosporin [neomycin-bacitracin Zn-polymyx] Other (See Comments)   Makes his skin red   Penicillins Hives, Itching   Has patient had a PCN reaction causing immediate rash, facial/tongue/throat swelling, SOB or lightheadedness with hypotension: Yes Has patient had a PCN reaction causing severe rash involving mucus membranes or skin necrosis: No Has patient had a PCN reaction that required hospitalization: Unknown Has patient had a PCN reaction occurring within the last 10 years: Unknown If all of the above answers are "NO", then may proceed with Cephalosporin use.   Penicillin G Rash      Medication List    STOP taking these medications   clindamycin 300 MG capsule Commonly known as:  CLEOCIN     TAKE these medications   acetaminophen 500 MG tablet Commonly known as:  TYLENOL Take 500 mg by mouth every 6 (six) hours as needed for mild pain.   aspirin 81 MG EC tablet Take 1 tablet (81 mg total) by mouth daily.   barrier cream Crea Commonly known as:  non-specified Apply 1 application topically 3 (  three) times daily as needed (sacrum and groin redness).   cephALEXin 500 MG capsule Commonly known as:  KEFLEX Take 1 capsule (500 mg total) by mouth every 8 (eight) hours for 2 days.   collagenase ointment Commonly known as:  SANTYL Apply  topically daily. Apply Santyl to upper and lower sacrum wounds Q day, then cover with moist gauze dressing and foam dressing.  (Change foam dressing Q 3 days or PRN soiling.)   furosemide 20 MG tablet Commonly known as:  LASIX Take 1 tablet (20 mg total) by mouth daily. What changed:  additional instructions   hydrOXYzine 25 MG tablet Commonly known as:  ATARAX/VISTARIL Take 25 mg by mouth 3 (three) times daily.   lactobacillus acidophilus & bulgar chewable tablet Chew 1 tablet by mouth 3 (three) times daily with meals.   morphine 20 MG/ML concentrated solution Commonly known as:  ROXANOL Place 5 mg under the tongue every 4 (four) hours as needed for severe pain.   OXYGEN Inhale 2 L into the lungs continuous.   potassium chloride 10 MEQ tablet Commonly known as:  K-DUR Take 1 tablet (10 mEq total) by mouth daily. Please take 20 mg oral twice daily for 5 days, then 20 mg oral daily for 5 days then stop. What changed:    how much to take  how to take this  when to take this   prednisoLONE acetate 1 % ophthalmic suspension Commonly known as:  PRED FORTE Place 2 drops into both eyes 2 (two) times daily as needed (allergies).      Follow-up Information    Gregor Hams, FNP Follow up.   Specialty:  Family Medicine Contact information: [place] [address] [phone]          Allergies  Allergen Reactions  . Neosporin [Neomycin-Bacitracin Zn-Polymyx] Other (See Comments)    Makes his skin red   . Penicillins Hives and Itching    Has patient had a PCN reaction causing immediate rash, facial/tongue/throat swelling, SOB or lightheadedness with hypotension: Yes Has patient had a PCN reaction causing severe rash involving mucus membranes or skin necrosis: No Has patient had a PCN reaction that required hospitalization: Unknown Has patient had a PCN reaction occurring within the last 10 years: Unknown If all of the above answers are "NO",  then may proceed with Cephalosporin use.   Marland Kitchen Penicillin G Rash     Procedures/Studies: Dg Chest 2 View  Result Date: 07/05/2018 CLINICAL DATA:  Unwitnessed fall. EXAM: CHEST - 2 VIEW COMPARISON:  Radiographs of May 28, 2018. FINDINGS: The heart size and mediastinal contours are within normal limits. Both lungs are clear. The visualized skeletal structures are unremarkable. IMPRESSION: No active cardiopulmonary disease. Electronically Signed   By: Marijo Conception, M.D.   On: 07/05/2018 18:45   Dg Pelvis 1-2 Views  Result Date: 07/05/2018 CLINICAL DATA:  Unwitnessed fall. EXAM: PELVIS - 1-2 VIEW COMPARISON:  None. FINDINGS: There is no evidence of pelvic fracture or diastasis. No pelvic bone lesions are seen. IMPRESSION: Negative. Electronically Signed   By: Marijo Conception, M.D.   On: 07/05/2018 18:46   Ct Head Wo Contrast  Result Date: 07/05/2018 CLINICAL DATA:  Unwitnessed fall. High clinical risk cervical spine trauma EXAM: CT HEAD WITHOUT CONTRAST CT CERVICAL SPINE WITHOUT CONTRAST TECHNIQUE: Multidetector CT imaging of the head and cervical spine was performed following the standard protocol without intravenous contrast. Multiplanar CT image reconstructions of the cervical spine were also  generated. COMPARISON:  05/28/2018 FINDINGS: CT HEAD FINDINGS Brain: No evidence of acute infarction, hemorrhage, hydrocephalus, extra-axial collection or mass lesion/mass effect. Vascular: Atherosclerotic calcification Skull: Negative for fracture Sinuses/Orbits: No evidence of injury. Bilateral cataract resection. Chronic sinusitis with extensive mucosal thickening in the frontal, ethmoid, and right more than left maxillary sinuses. Chronic left partial mastoid opacification with negative nasopharynx. CT CERVICAL SPINE FINDINGS Alignment: No traumatic malalignment Skull base and vertebrae: Negative for fracture Soft tissues and spinal canal: No prevertebral fluid or swelling. No visible canal  hematoma. Disc levels: Extensive disc and facet degeneration with multilevel foraminal stenosis. Retro dental ligamentous thickening and calcification without erosion. Upper chest: Negative IMPRESSION: No evidence of acute intracranial or cervical spine injury Electronically Signed   By: Monte Fantasia M.D.   On: 07/05/2018 19:06   Ct Cervical Spine Wo Contrast  Result Date: 07/05/2018 CLINICAL DATA:  Unwitnessed fall. High clinical risk cervical spine trauma EXAM: CT HEAD WITHOUT CONTRAST CT CERVICAL SPINE WITHOUT CONTRAST TECHNIQUE: Multidetector CT imaging of the head and cervical spine was performed following the standard protocol without intravenous contrast. Multiplanar CT image reconstructions of the cervical spine were also generated. COMPARISON:  05/28/2018 FINDINGS: CT HEAD FINDINGS Brain: No evidence of acute infarction, hemorrhage, hydrocephalus, extra-axial collection or mass lesion/mass effect. Vascular: Atherosclerotic calcification Skull: Negative for fracture Sinuses/Orbits: No evidence of injury. Bilateral cataract resection. Chronic sinusitis with extensive mucosal thickening in the frontal, ethmoid, and right more than left maxillary sinuses. Chronic left partial mastoid opacification with negative nasopharynx. CT CERVICAL SPINE FINDINGS Alignment: No traumatic malalignment Skull base and vertebrae: Negative for fracture Soft tissues and spinal canal: No prevertebral fluid or swelling. No visible canal hematoma. Disc levels: Extensive disc and facet degeneration with multilevel foraminal stenosis. Retro dental ligamentous thickening and calcification without erosion. Upper chest: Negative IMPRESSION: No evidence of acute intracranial or cervical spine injury Electronically Signed   By: Monte Fantasia M.D.   On: 07/05/2018 19:06     Subjective: Without complaints  Discharge Exam: Vitals:   07/10/18 0542 07/10/18 0929  BP:  (!) 101/57  Pulse:  86  Resp:  17  Temp: 98.3 F (36.8  C) 98.3 F (36.8 C)  SpO2:  100%   Vitals:   07/10/18 0210 07/10/18 0540 07/10/18 0542 07/10/18 0929  BP: 103/61 (!) 94/59  (!) 101/57  Pulse: 83 81  86  Resp: 18 16  17   Temp:   98.3 F (36.8 C) 98.3 F (36.8 C)  TempSrc:    Oral  SpO2: 100% 100%  100%  Weight:      Height:        General: Pt is alert, awake, not in acute distress Cardiovascular: RRR, S1/S2 +, no rubs, no gallops Respiratory: CTA bilaterally, no wheezing, no rhonchi Abdominal: Soft, NT, ND, bowel sounds + Extremities: no edema, no cyanosis   The results of significant diagnostics from this hospitalization (including imaging, microbiology, ancillary and laboratory) are listed below for reference.     Microbiology: Recent Results (from the past 240 hour(s))  Blood culture (routine x 2)     Status: None   Collection Time: 07/05/18  5:40 PM  Result Value Ref Range Status   Specimen Description BLOOD RIGHT FOREARM  Final   Special Requests   Final    BOTTLES DRAWN AEROBIC AND ANAEROBIC Blood Culture adequate volume   Culture   Final    NO GROWTH 5 DAYS Performed at [facility], [address],  [place] [address]    Report Status 07/10/2018 FINAL  Final  Aerobic/Anaerobic Culture (surgical/deep wound)     Status: None (Preliminary result)   Collection Time: 07/05/18  9:18 PM  Result Value Ref Range Status   Specimen Description SACRAL  Final   Special Requests NONE  Final   Gram Stain   Final    FEW WBC PRESENT, PREDOMINANTLY PMN FEW GRAM POSITIVE COCCI    Culture   Final    FEW PROTEUS MIRABILIS HOLDING FOR POSSIBLE ANAEROBE Performed at [facility], [place] [address] [place], [place] [address]    Report Status PENDING  Incomplete   Organism ID, Bacteria PROTEUS MIRABILIS  Final      Susceptibility   Proteus mirabilis - MIC*    AMPICILLIN <=2 SENSITIVE Sensitive     CEFAZOLIN <=4 SENSITIVE Sensitive     CEFEPIME <=1 SENSITIVE Sensitive     CEFTAZIDIME <=1 SENSITIVE  Sensitive     CEFTRIAXONE <=1 SENSITIVE Sensitive     CIPROFLOXACIN >=4 RESISTANT Resistant     GENTAMICIN <=1 SENSITIVE Sensitive     IMIPENEM 2 SENSITIVE Sensitive     TRIMETH/SULFA >=320 RESISTANT Resistant     AMPICILLIN/SULBACTAM <=2 SENSITIVE Sensitive     PIP/TAZO <=4 SENSITIVE Sensitive     * FEW PROTEUS MIRABILIS  Urine culture     Status: Abnormal   Collection Time: 07/05/18 10:31 PM  Result Value Ref Range Status   Specimen Description URINE, RANDOM  Final   Special Requests   Final    NONE Performed at [facility], [place] [address] [place], [place] [address]    Culture (A)  Final    >=100,000 COLONIES/mL ACINETOBACTER CALCOACETICUS/BAUMANNII COMPLEX   Report Status 07/08/2018 FINAL  Final   Organism ID, Bacteria ACINETOBACTER CALCOACETICUS/BAUMANNII COMPLEX (A)  Final      Susceptibility   Acinetobacter calcoaceticus/baumannii complex - MIC*    CEFTAZIDIME >=64 RESISTANT Resistant     CEFTRIAXONE >=64 RESISTANT Resistant     CIPROFLOXACIN >=4 RESISTANT Resistant     GENTAMICIN <=1 SENSITIVE Sensitive     IMIPENEM 0.5 SENSITIVE Sensitive     PIP/TAZO >=128 RESISTANT Resistant     TRIMETH/SULFA <=20 SENSITIVE Sensitive     CEFEPIME RESISTANT Resistant     AMPICILLIN/SULBACTAM 4 SENSITIVE Sensitive     * >=100,000 COLONIES/mL ACINETOBACTER CALCOACETICUS/BAUMANNII COMPLEX  Blood culture (routine x 2)     Status: None (Preliminary result)   Collection Time: 07/05/18 11:08 PM  Result Value Ref Range Status   Specimen Description BLOOD RIGHT HAND  Final   Special Requests   Final    BOTTLES DRAWN AEROBIC ONLY Blood Culture results may not be optimal due to an inadequate volume of blood received in culture bottles   Culture   Final    NO GROWTH 4 DAYS Performed at [facility], [place] [address], [place], [place] [address]    Report Status PENDING  Incomplete  MRSA PCR Screening     Status: Abnormal   Collection Time: 07/05/18 11:43 PM  Result Value Ref  Range Status   MRSA by PCR POSITIVE (A) NEGATIVE Final    Comment:        The GeneXpert MRSA Assay (FDA approved for NASAL specimens only), is one component of a comprehensive MRSA colonization surveillance program. It is not intended to diagnose MRSA infection nor to guide or monitor treatment for MRSA infections. RESULT CALLED TO, READ BACK BY AND VERIFIED WITH: GENGLER,C  RN 07/06/18 AT 0123 SKEEN,P      Labs: BNP (last 3 results) Recent Labs    12/05/17 1936 12/08/17 0415  BNP 1,182.7* 829.9*   Basic Metabolic Panel: Recent Labs  Lab 07/05/18 1803 07/05/18 2019 07/06/18 0438  NA 131*  --  132*  K 2.5*  --  3.9  CL 85*  --  91*  CO2 33*  --  24  GLUCOSE 134*  --  137*  BUN 13  --  14  CREATININE 1.06  --  1.05  CALCIUM 8.7*  --  8.7*  MG  --  2.1  --    Liver Function Tests: No results for input(s): AST, ALT, ALKPHOS, BILITOT, PROT, ALBUMIN in the last 168 hours. No results for input(s): LIPASE, AMYLASE in the last 168 hours. No results for input(s): AMMONIA in the last 168 hours. CBC: Recent Labs  Lab 07/05/18 1803 07/06/18 0438  WBC 19.0* 9.7  NEUTROABS 13.8*  --   HGB 12.7* 13.4  HCT 40.6 43.6  MCV 83.0 86.0  PLT 467* 430*   Cardiac Enzymes: No results for input(s): CKTOTAL, CKMB, CKMBINDEX, TROPONINI in the last 168 hours. BNP: Invalid input(s): POCBNP CBG: No results for input(s): GLUCAP in the last 168 hours. D-Dimer No results for input(s): DDIMER in the last 72 hours. Hgb A1c No results for input(s): HGBA1C in the last 72 hours. Lipid Profile No results for input(s): CHOL, HDL, LDLCALC, TRIG, CHOLHDL, LDLDIRECT in the last 72 hours. Thyroid function studies No results for input(s): TSH, T4TOTAL, T3FREE, THYROIDAB in the last 72 hours.  Invalid input(s): FREET3 Anemia work up No results for input(s): VITAMINB12, FOLATE, FERRITIN, TIBC, IRON, RETICCTPCT in the last 72 hours. Urinalysis    Component Value Date/Time   COLORURINE  YELLOW 07/05/2018 2231   APPEARANCEUR TURBID (A) 07/05/2018 2231   LABSPEC 1.016 07/05/2018 2231   PHURINE 5.0 07/05/2018 2231   GLUCOSEU NEGATIVE 07/05/2018 2231   HGBUR MODERATE (A) 07/05/2018 2231   BILIRUBINUR NEGATIVE 07/05/2018 2231   KETONESUR NEGATIVE 07/05/2018 2231   PROTEINUR 100 (A) 07/05/2018 2231   NITRITE NEGATIVE 07/05/2018 2231   LEUKOCYTESUR MODERATE (A) 07/05/2018 2231   Sepsis Labs Invalid input(s): PROCALCITONIN,  WBC,  LACTICIDVEN Microbiology Recent Results (from the past 240 hour(s))  Blood culture (routine x 2)     Status: None   Collection Time: 07/05/18  5:40 PM  Result Value Ref Range Status   Specimen Description BLOOD RIGHT FOREARM  Final   Special Requests   Final    BOTTLES DRAWN AEROBIC AND ANAEROBIC Blood Culture adequate volume   Culture   Final    NO GROWTH 5 DAYS Performed at [facility], [address], [place], [place] [address]    Report Status 07/10/2018 FINAL  Final  Aerobic/Anaerobic Culture (surgical/deep wound)     Status: None (Preliminary result)   Collection Time: 07/05/18  9:18 PM  Result Value Ref Range Status   Specimen Description SACRAL  Final   Special Requests NONE  Final   Gram Stain   Final    FEW WBC PRESENT, PREDOMINANTLY PMN FEW GRAM POSITIVE COCCI    Culture   Final    FEW PROTEUS MIRABILIS HOLDING FOR POSSIBLE ANAEROBE Performed at [facility], [place] [address] [place], [place] [address]    Report Status PENDING  Incomplete   Organism ID, Bacteria PROTEUS MIRABILIS  Final      Susceptibility   Proteus mirabilis - MIC*  AMPICILLIN <=2 SENSITIVE Sensitive     CEFAZOLIN <=4 SENSITIVE Sensitive     CEFEPIME <=1 SENSITIVE Sensitive     CEFTAZIDIME <=1 SENSITIVE Sensitive     CEFTRIAXONE <=1 SENSITIVE Sensitive     CIPROFLOXACIN >=4 RESISTANT Resistant     GENTAMICIN <=1 SENSITIVE Sensitive     IMIPENEM 2 SENSITIVE Sensitive     TRIMETH/SULFA >=320 RESISTANT Resistant     AMPICILLIN/SULBACTAM  <=2 SENSITIVE Sensitive     PIP/TAZO <=4 SENSITIVE Sensitive     * FEW PROTEUS MIRABILIS  Urine culture     Status: Abnormal   Collection Time: 07/05/18 10:31 PM  Result Value Ref Range Status   Specimen Description URINE, RANDOM  Final   Special Requests   Final    NONE Performed at [facility], [place] [address], [place], [place] [address]    Culture (A)  Final    >=100,000 COLONIES/mL ACINETOBACTER CALCOACETICUS/BAUMANNII COMPLEX   Report Status 07/08/2018 FINAL  Final   Organism ID, Bacteria ACINETOBACTER CALCOACETICUS/BAUMANNII COMPLEX (A)  Final      Susceptibility   Acinetobacter calcoaceticus/baumannii complex - MIC*    CEFTAZIDIME >=64 RESISTANT Resistant     CEFTRIAXONE >=64 RESISTANT Resistant     CIPROFLOXACIN >=4 RESISTANT Resistant     GENTAMICIN <=1 SENSITIVE Sensitive     IMIPENEM 0.5 SENSITIVE Sensitive     PIP/TAZO >=128 RESISTANT Resistant     TRIMETH/SULFA <=20 SENSITIVE Sensitive     CEFEPIME RESISTANT Resistant     AMPICILLIN/SULBACTAM 4 SENSITIVE Sensitive     * >=100,000 COLONIES/mL ACINETOBACTER CALCOACETICUS/BAUMANNII COMPLEX  Blood culture (routine x 2)     Status: None (Preliminary result)   Collection Time: 07/05/18 11:08 PM  Result Value Ref Range Status   Specimen Description BLOOD RIGHT HAND  Final   Special Requests   Final    BOTTLES DRAWN AEROBIC ONLY Blood Culture results may not be optimal due to an inadequate volume of blood received in culture bottles   Culture   Final    NO GROWTH 4 DAYS Performed at [facility], [place] [address], [place], [place] [address]    Report Status PENDING  Incomplete  MRSA PCR Screening     Status: Abnormal   Collection Time: 07/05/18 11:43 PM  Result Value Ref Range Status   MRSA by PCR POSITIVE (A) NEGATIVE Final    Comment:        The GeneXpert MRSA Assay (FDA approved for NASAL specimens only), is one component of a comprehensive MRSA colonization surveillance program. It is not intended to  diagnose MRSA infection nor to guide or monitor treatment for MRSA infections. RESULT CALLED TO, READ BACK BY AND VERIFIED WITH: [facility] RN 07/06/18 AT 0123 SKEEN,P    Time spent: 30 min  SIGNED:   Marylu Lund, MD  [facility] 07/10/2018, 3:02 PM  If 7PM-7AM, please contact night-coverage

## 2018-07-10 NOTE — Clinical Social Work Placement (Signed)
   CLINICAL SOCIAL WORK PLACEMENT  NOTE  Date:  07/10/2018  Patient Details  Name: [name] MRN: [id number] Date of Birth: 21-Apr-1927  Clinical Social Work is seeking post-discharge placement for this patient at the [facility] level of care (*CSW will initial, date and re-position this form in  chart as items are completed):  No(CSW provided with wife's facility preferences)   Patient/family provided with [facility]'s list of facilities offering this level of care within the geographic area requested by the patient (or if unable, by the patient's family).  Yes   Patient/family informed of their freedom to choose among providers that offer the needed level of care, that participate in Medicare, Medicaid or managed care program needed by the patient, have an available bed and are willing to accept the patient.  Yes   Patient/family informed of [facility]'s ownership interest in [facility] and [facility], as well as of the fact that they are under no obligation to receive care at these facilities.  PASRR submitted to EDS on       PASRR number received on       Existing PASRR number confirmed on 07/08/18     FL2 transmitted to all facilities in geographic area requested by pt/family on 07/08/18     FL2 transmitted to all facilities within larger geographic area on       Patient informed that his/her managed care company has contracts with or will negotiate with certain facilities, including the following:        Yes(Facility preferences are: [facility], [facility], [facility])   Patient/family informed of bed offers received.  Patient chooses bed at  [facility] recommends and patient chooses bed at      Patient to be transferred to  [facility] on  07/10/18.  Patient to be transferred to facility by  Ambulance     Patient family notified on  07/10/18 of transfer.  Name of family member  notified:   Wife, Aaiden Depoy by phone - [phone]     PHYSICIAN       Additional Comment:    _______________________________________________ Sable Feil, LCSW 07/10/2018, 5:01 PM

## 2018-07-10 NOTE — Progress Notes (Signed)
Physical Therapy Treatment Patient Details Name: [name] MRN: [id number] DOB: 23-Mar-1927 Today's Date: 07/10/2018    History of Present Illness Pt is an 84 y/o male with medical history significant for HTN, CKD, dementia, prostate and bladder cancer s/p chronic indwelling foley, CHF EF 35-40%, stroke, recurrent falls and recurrent UTIs, sacral decubitus, A.Fib not on anticoagulation who presented from [facility] after an unwitnessed fall. Imaging negative for acute issues. Pt was found to have a UTI and a stage IV saral wound seen in the ED - per surgery will manage non-operatively.      PT Comments    Pt progressing towards physical therapy goals. Upon initial transfer to sitting, pt reports severe vertigo and became nauseated. No nystagmus noted during this episode. After ~8 minutes of sitting EOB pt reports improvement of symptoms and we were able to transition to recliner. VSS during this time. Will continue to follow and progress as able per POC.   Follow Up Recommendations  SNF;Supervision/Assistance - 24 hour     Equipment Recommendations  None recommended by PT(TBD by next venue of care)    Recommendations for Other Services       Precautions / Restrictions Precautions Precautions: Fall Precaution Comments: Several unwitnessed falls at SNF per chart review Restrictions Weight Bearing Restrictions: No    Mobility  Bed Mobility Overal bed mobility: Needs Assistance Bed Mobility: Supine to Sit     Supine to sit: Mod assist     General bed mobility comments: Pt initiated transition to EOB. Therapist provided assist with bed pad for scooting, and trunk support to elevate to full sitting position.   Transfers Overall transfer level: Needs assistance Equipment used: 2 person hand held assist Transfers: Stand Pivot Transfers   Stand pivot transfers: Max assist;+2 physical assistance       General transfer comment: VC's for sequencing and safety with  power-up to full stand as well as pivotal steps around to the recliner.   Ambulation/Gait             General Gait Details: Unable   Stairs             Wheelchair Mobility    Modified Rankin (Stroke Patients Only)       Balance Overall balance assessment: Needs assistance Sitting-balance support: Feet supported;Bilateral upper extremity supported Sitting balance-Leahy Scale: Poor Sitting balance - Comments: Assist required throughout sitting activity.  Postural control: Posterior lean Standing balance support: Bilateral upper extremity supported;During functional activity Standing balance-Leahy Scale: Zero Standing balance comment: +2 required.                             Cognition Arousal/Alertness: Awake/alert Behavior During Therapy: WFL for tasks assessed/performed Overall Cognitive Status: History of cognitive impairments - at baseline(most likely at baseline)                                        Exercises      General Comments        Pertinent Vitals/Pain Pain Assessment: Faces Faces Pain Scale: Hurts even more Pain Location: Sacral wound Pain Descriptors / Indicators: Grimacing;Guarding;Moaning Pain Intervention(s): Monitored during session    Home Living                      Prior Function  PT Goals (current goals can now be found in the care plan section) Acute Rehab PT Goals Patient Stated Goal: none stated PT Goal Formulation: Patient unable to participate in goal setting Time For Goal Achievement: 07/21/18 Potential to Achieve Goals: Fair Progress towards PT goals: Progressing toward goals    Frequency    Min 2X/week      PT Plan Current plan remains appropriate    Co-evaluation              AM-PAC PT "6 Clicks" Daily Activity  Outcome Measure  Difficulty turning over in bed (including adjusting bedclothes, sheets and blankets)?: Unable Difficulty moving from  lying on back to sitting on the side of the bed? : Unable Difficulty sitting down on and standing up from a chair with arms (e.g., wheelchair, bedside commode, etc,.)?: Unable Help needed moving to and from a bed to chair (including a wheelchair)?: Total Help needed walking in hospital room?: Total Help needed climbing 3-5 steps with a railing? : Total 6 Click Score: 6    End of Session Equipment Utilized During Treatment: Oxygen Activity Tolerance: Patient limited by pain Patient left: in bed;with call bell/phone within reach;with nursing/sitter in room Nurse Communication: Mobility status;Need for lift equipment(Stedy for back to bed) PT Visit Diagnosis: Repeated falls (R29.6);Muscle weakness (generalized) (M62.81)     Time: 5997-7414 PT Time Calculation (min) (ACUTE ONLY): 37 min  Charges:  $Gait Training: 23-37 mins                     Rolinda Roan, PT, DPT Acute Rehabilitation Services Pager: [phone] Office: [phone]    Thelma Comp 07/10/2018, 2:45 PM

## 2018-07-11 LAB — AEROBIC/ANAEROBIC CULTURE W GRAM STAIN (SURGICAL/DEEP WOUND)

## 2018-07-11 LAB — AEROBIC/ANAEROBIC CULTURE (SURGICAL/DEEP WOUND)

## 2018-07-11 LAB — CULTURE, BLOOD (ROUTINE X 2): Culture: NO GROWTH

## 2018-07-13 ENCOUNTER — Encounter: Payer: Self-pay | Admitting: Internal Medicine

## 2018-07-13 ENCOUNTER — Non-Acute Institutional Stay (SKILLED_NURSING_FACILITY): Payer: Medicare Other | Admitting: Internal Medicine

## 2018-07-13 DIAGNOSIS — T83511A Infection and inflammatory reaction due to indwelling urethral catheter, initial encounter: Secondary | ICD-10-CM | POA: Diagnosis not present

## 2018-07-13 DIAGNOSIS — R413 Other amnesia: Secondary | ICD-10-CM

## 2018-07-13 DIAGNOSIS — L89154 Pressure ulcer of sacral region, stage 4: Secondary | ICD-10-CM | POA: Diagnosis not present

## 2018-07-13 DIAGNOSIS — R296 Repeated falls: Secondary | ICD-10-CM | POA: Diagnosis not present

## 2018-07-13 DIAGNOSIS — N390 Urinary tract infection, site not specified: Secondary | ICD-10-CM

## 2018-07-13 NOTE — Assessment & Plan Note (Addendum)
He is completing a course of Keflex, resistant Acinetobacter was felt to be possible catheter associated contaminant

## 2018-07-13 NOTE — Progress Notes (Signed)
NURSING HOME LOCATION:  [facility] ROOM NUMBER:  [id number]  CODE STATUS:  DNR (no CPR, no ACLS medications, no cardioversion.  Intubation and BiPAP accepted by patient)  PCP:  Gregor Hams, [place] [address]  This is a comprehensive admission note to [facility] performed on this date less than 30 days from date of admission. Included are preadmission medical/surgical history; reconciled medication list; family history; social history and comprehensive review of systems.  Corrections and additions to the records were documented. Comprehensive physical exam was also performed. Additionally a clinical summary was entered for each active diagnosis pertinent to this admission in the Problem List to enhance continuity of care.  HPI: Patient was hospitalized 10/20-10/25/2019 for recurrent, unwitnessed falls.  He was found to have sacral decubitus with some erythema and possible recurrent UTI suggested on urinalysis.  Leukocytosis and hypokalemia were documented, these improved with intervention.  Surgery did not feel stage IV sacral decubitus needed further intervention beyond daily wound care and completion of Keflex course.  Catheter associated resistant Acinetobacter was documented on urine culture, this was felt to be probable contaminant. Patient is in chronic A. fib with rate control as goal.  No anticoagulation was recommended because of recurrent falls. PT/OT was recommended to SNF.  Past medical and surgical history: Includes chronic hypoxic respiratory failure for which he is on home O2 at 2 L/min, chronic systolic heart failure, nonobstructive coronary artery disease, essential hypertension, memory deficit, history of V. tach, sleep apnea and history of bladder/prostate cancer.  He has had transurethral resection of a bladder tumor as well as prostate surgery.  Social history: Nondrinker, former smoker.  Family history: Noncontributory due  to age   Review of systems:  Could not be completed due to dementia.  He was unable to provide me with the date.  PT/OT stated that he was able to stand with one person assist.  They stated that when he was sitting up and alert he did know the year.  He does describe "pain in the back". No dysuria, hematuria, pyuria, incontinence, nocturia   Physical exam:  Pertinent or positive findings: He appears chronically ill.  He is wearing oxygen nasally.  He is Geneticist, molecular.  Mustache is unkempt.  He is markedly lethargic drifting off to sleep repeatedly.  He has pattern alopecia.  He is markedly hard of hearing.  Speech is somewhat slurred.  Right nasolabial fold is decreased.  Teeth are stained and he has multiple missing teeth.  Heart sounds are distant.  He has minor rales at the bases.  He is weak in all extremities.  He has healing ulcers with eschar of the knee.  Foley catheter in place.  General appearance: no acute distress, increased work of breathing is present.   Lymphatic: No lymphadenopathy about the head, neck, axilla. Eyes: No conjunctival inflammation or lid edema is present. There is no scleral icterus. Ears:  External ear exam shows no significant lesions or deformities.   Nose:  External nasal examination shows no deformity or inflammation. Nasal mucosa are pink and moist without lesions, exudates Oral exam: Lips and gums are healthy appearing.There is no oropharyngeal erythema or exudate. Neck:  No thyromegaly, masses, tenderness noted.    Heart:  No gallop, murmur, click, rub.  Lungs: without wheezes, rhonchi, rubs. Abdomen: Bowel sounds are normal.  Abdomen is soft and nontender with no organomegaly, hernias, masses. GU: Deferred  Extremities:  No cyanosis, clubbing, edema. Neurologic  exam:  Balance, Rhomberg, finger to nose testing could not be completed due to clinical state Skin: Warm & dry w/o tenting. No significant  rash.  See clinical summary under each active problem in  the Problem List with associated updated therapeutic plan

## 2018-07-13 NOTE — Assessment & Plan Note (Addendum)
PT/OT at SNF D/C Vistaril

## 2018-07-13 NOTE — Assessment & Plan Note (Addendum)
Complete SLUMS assessment ; this is a mental status assessment of possible dementia published by the [employer]. The test differentiates between high school or less education level in reference to presence or absence of dementia.

## 2018-07-13 NOTE — Assessment & Plan Note (Signed)
Wound care at the SNF

## 2018-07-15 NOTE — Patient Instructions (Signed)
See assessment and plan under each diagnosis in the problem list and acutely for this visit 

## 2018-07-22 LAB — BASIC METABOLIC PANEL
BUN: 13 (ref 4–21)
Creatinine: 0.9 (ref 0.6–1.3)
GLUCOSE: 136
Potassium: 4.7 (ref 3.4–5.3)
Sodium: 132 — AB (ref 137–147)

## 2018-07-22 LAB — CBC AND DIFFERENTIAL
HEMATOCRIT: 37 — AB (ref 41–53)
Hemoglobin: 11.7 — AB (ref 13.5–17.5)
NEUTROS ABS: 13
PLATELETS: 407 — AB (ref 150–399)
WBC: 17.8

## 2018-07-23 ENCOUNTER — Non-Acute Institutional Stay (SKILLED_NURSING_FACILITY): Payer: Medicare HMO | Admitting: Adult Health

## 2018-07-23 ENCOUNTER — Encounter: Payer: Self-pay | Admitting: Adult Health

## 2018-07-23 DIAGNOSIS — L089 Local infection of the skin and subcutaneous tissue, unspecified: Secondary | ICD-10-CM

## 2018-07-23 DIAGNOSIS — R630 Anorexia: Secondary | ICD-10-CM

## 2018-07-23 DIAGNOSIS — E876 Hypokalemia: Secondary | ICD-10-CM | POA: Diagnosis not present

## 2018-07-23 DIAGNOSIS — L8995 Pressure ulcer of unspecified site, unstageable: Secondary | ICD-10-CM

## 2018-07-23 NOTE — Progress Notes (Addendum)
Location:  [facility] Room Number: 308-M Place of Service:  SNF (31) Provider:  Durenda Age, NP  Patient Care Team: Gregor Hams, FNP as PCP - General (Family Medicine) [name], MD as Rounding Team (Internal Medicine)  Extended Emergency Contact Information Primary Emergency Contact: Morella,Barbara Address: [address]          [place], [place] [address] Johnnette Litter of [place] Phone: [phone] Relation: Spouse Secondary Emergency Contact: Valora Corporal Mobile Phone: [phone] Relation: Son  Code Status:  DNR  Goals of care: Advanced Directive information Advanced Directives 07/13/2018  Does Patient Have a Medical Advance Directive? Yes  Type of Advance Directive Out of facility DNR (pink MOST or yellow form)  Does patient want to make changes to medical advance directive? No - Patient declined  Would patient like information on creating a medical advance directive? No - Patient declined  Pre-existing out of facility DNR order (yellow form or pink MOST form) Pink MOST form placed in chart (order not valid for inpatient use)    Chief Complaint  Patient presents with  . Acute Visit    Patient seen due to decline in condition - anorexia, sacral wound, leukocytosis    HPI:  Pt is a 84 y.o. male seen today for an acute visit secondary to anorexia, leukocytosis, and a worsening sacral wound. He was seen in his room with treatment nurse, PT, and OT at bedside. He complains of pain whenever he is turned. Sacral wound noted to have black spots, has gray slough, foul smelling dressing noted. No fever has been reported. Latest wbc 17.8 hgb 11.7  hct 36.7, creatinine 0.91  BUN 13.3. eGFR 74.30, K 4.7, Na 132. He was reported to have poor appetite and eats 25% of his food.   He has been admitted to [facility] 07[facility]18 after a recent hospitalization due to sacral decubitus with some erythema and evidence of  recurrent UTI and hypokalemia. Surgery has evaluated and recommended no surgery. He was put on Keflex and has completed treatment.   He is a short-term rehabilitation resident of [facility].  He has a PMH of chronic hypoxic respiratory failure, nonobstructive coronary artery disease, essential hypertension, memory deficit, history of V. Tach, sleep apnea, and history of bladder/prostate cancer.  Past Medical History:  Diagnosis Date  . A-fib ([place])   . AKI (acute kidney injury) ([place])   . Arthritis   . Benign hypertension with chronic kidney disease, stage III ([place]) 11/28/2015  . CAD (coronary artery disease) 05/31/2016   cath 02/28/11: dLM 10%, pLAD 20%, pRCA calcified nodule 50% (FFR 0.86 - not significant), mRCA 40-50%, mildly elevated filling pressures, EF 50-55%  . Cataract   . CKD (chronic kidney disease), stage III ([place]) 11/28/2015   follows up with Dr. Clydell Hakim Last creatinine 1.36, EGFR 50  . Dyslipidemia   . First degree AV block 06/28/2013  . Gait abnormality 05/26/2017  . Glaucoma   . Hard of hearing   . Hyperlipidemia 11/28/2015   Last Assessment & Plan: Lipids were at goal a year ago; continue statin  . Hypertension    LOV with EKG 06/11/11 Dr Caryl Comes  [facility]  . Malignant neoplasm of prostate [facility])    surgery and radiation  . Malignant neoplasm of urinary bladder ([place]) 07/01/2012   surgery and chemo  . Memory difficulty 05/26/2017  . NSVT (nonsustained ventricular tachycardia) ([place]) 11/28/2015   Overview: Overview: Overview: echo 6/12: EF 55%, basal inferolat  AK, mild LVH Last Assessment & Plan: resolved Overview: echo 6/12: EF 55%, basal inferolat AK, mild LVH Last Assessment & Plan: resolved  . Paroxysmal ventricular tachycardia ([place]) 05/31/2016   Overview: Overview: echo 6/12: EF 55%, basal inferolat AK, mild LVH Last Assessment & Plan: resolved  . Prostate cancer ([place])    status post brachytherapy---in 2003  . PVC (premature ventricular contraction)     . Sleep apnea    STOP BANG SCORE 4  . Ventricular bigeminy 05/31/2016   Overview: Overview: rhythmol Date PR QRS 6/12 23 088 10/13 24 110 10/14 31 109 Last Assessment & Plan: The patient is without symptomatic oral echocardiographic ectopy. His exercise tolerance is quite good. I am concerned about the interval prolongation of his PR interval. We will follow this closely. It is potentially related to the presence of his 1C antiarrhythmic and we may need to discontinue  . Ventricular tachycardia, nonsustained/bigeminy    echo 6/12: EF 55%, basal inferolat AK, mild LVH   Past Surgical History:  Procedure Laterality Date  . APPENDECTOMY  1951  . BLADDER SURGERY  2013   followed by chemo  . CARDIAC CATHETERIZATION  6/12  . CATARACT EXTRACTION W/ INTRAOCULAR LENS IMPLANT Right 2016   Right eye [facility] Dr. Lennox Pippins  . CATARACT EXTRACTION W/ INTRAOCULAR LENS IMPLANT Left 2014  . CORONARY ANGIOPLASTY     no significant blockage requiring stenting per patient  . CYSTOSCOPY  05/20/2012   Procedure: CYSTOSCOPY;  Surgeon: Alexis Frock, MD;  Location: [facility];  Service: Urology;  Laterality: N/A;  . CYSTOSCOPY  07/01/2012   Procedure: CYSTOSCOPY;  Surgeon: Molli Hazard, MD;  Location: [facility];  Service: Urology;  Laterality: N/A;  . DESCEMETS STRIPPING AUTOMATED ENDOTHELIAL KERATOPLASTY Right 06/13/2016   Procedure: DESCEMETS STRIPPING ENDOTHELIAL KERATOPLASTY; Surgeon: Marilynne Halsted, MD; Location: [facility] MAIN OR; Service: Ophthalmology; Laterality: Right;  . DESCEMETS STRIPPING AUTOMATED ENDOTHELIAL KERATOPLASTY Right 11/14/2016   Procedure: DESCEMETS STRIPPING ENDOTHELIAL KERATOPLASTY; Surgeon: Marilynne Halsted, MD; Location: [facility]IN OR; Service: Ophthalmology; Laterality: Right;  . EYE SURGERY     left cataract extraction with IOL  . PROSTATE SURGERY  2003   followed by radiation  . SKIN BIOPSY     BCC excised   . SKIN CANCER EXCISION     x 4  . TRANSURETHRAL RESECTION OF  BLADDER TUMOR  05/20/2012   Procedure: TRANSURETHRAL RESECTION OF BLADDER TUMOR (TURBT);  Surgeon: Alexis Frock, MD;  Location: [facility];  Service: Urology;  Laterality: N/A;  [place]   . TRANSURETHRAL RESECTION OF BLADDER TUMOR  07/01/2012   Procedure: TRANSURETHRAL RESECTION OF BLADDER TUMOR (TURBT);  Surgeon: Molli Hazard, MD;  Location: [facility];  Service: Urology;  Laterality: N/A;  Cystoscopy, TURBT, be prepared for Open Cystorraphy        Allergies  Allergen Reactions  . Neosporin [Neomycin-Bacitracin Zn-Polymyx] Other (See Comments)    Makes his skin red   . Penicillins Hives and Itching    Has patient had a PCN reaction causing immediate rash, facial/tongue/throat swelling, SOB or lightheadedness with hypotension: Yes Has patient had a PCN reaction causing severe rash involving mucus membranes or skin necrosis: No Has patient had a PCN reaction that required hospitalization: Unknown Has patient had a PCN reaction occurring within the last 10 years: Unknown If all of the above answers are "NO", then may proceed with Cephalosporin use.   Marland Kitchen Penicillin G Rash    Outpatient Encounter Medications as of 07/23/2018  Medication Sig  .  acetaminophen (TYLENOL) 500 MG tablet Take 500 mg by mouth 3 (three) times daily.   . barrier cream (NON-SPECIFIED) CREA Apply 1 application topically 3 (three) times daily as needed (sacrum and groin redness).  . collagenase (SANTYL) ointment Apply topically daily. Apply Santyl to upper and lower sacrum wounds Q day, then cover with moist gauze dressing and foam dressing.  (Change foam dressing Q 3 days or PRN soiling.)  . furosemide (LASIX) 20 MG tablet Take 1 tablet (20 mg total) by mouth daily.  Marland Kitchen morphine (ROXANOL) 20 MG/ML concentrated solution Place 5 mg under the tongue every 4 (four) hours as needed for severe pain.  . Multiple Vitamins-Minerals (MULTIVITAMIN WITH MINERALS) tablet Take 1 tablet by mouth daily.  . OXYGEN Inhale 2 L into the  lungs continuous.  . potassium chloride (K-DUR) 10 MEQ tablet Take 1 tablet (10 mEq total) by mouth daily. Please take 20 mg oral twice daily for 5 days, then 20 mg oral daily for 5 days then stop.  . prednisoLONE acetate (PRED FORTE) 1 % ophthalmic suspension Place 1 drop into both eyes 2 (two) times daily as needed (allergies).   . [DISCONTINUED] aspirin EC 81 MG EC tablet Take 1 tablet (81 mg total) by mouth daily.  . [DISCONTINUED] cephALEXin (KEFLEX) 500 MG capsule Take 500 mg by mouth 3 (three) times daily.  . [DISCONTINUED] hydrOXYzine (ATARAX/VISTARIL) 25 MG tablet Take 25 mg by mouth 3 (three) times daily.  . [DISCONTINUED] lactobacillus acidophilus & bulgar (LACTINEX) chewable tablet Chew 1 tablet by mouth 3 (three) times daily with meals.   No facility-administered encounter medications on file as of 07/23/2018.     Review of Systems  GENERAL: No change in appetite, no fatigue, no weight changes, no fever, or chills MOUTH and THROAT: Denies oral discomfort, gingival pain or bleeding, pain from teeth or hoarseness   RESPIRATORY: no cough, SOB, DOE, wheezing, hemoptysis CARDIAC: No chest pain, edema or palpitations GI: No abdominal pain, diarrhea, constipation, heart burn, nausea or vomiting PSYCHIATRIC: Denies feelings of depression or anxiety. No report of hallucinations, insomnia, paranoia, or agitation   Immunization History  Administered Date(s) Administered  . Influenza Inj Mdck Quad Pf 07/24/2016  . Influenza, High Dose Seasonal PF 07/24/2016, 08/22/2017, 06/24/2018  . Influenza,inj,Quad PF,6+ Mos 07/24/2016  . Tdap 02/11/2016   Pertinent  Health Maintenance Due  Topic Date Due  . PNA vac Low Risk Adult (1 of 2 - PCV13) 04/07/1994  . INFLUENZA VACCINE  Completed   Fall Risk  10/08/2017  Falls in the past year? No      Vitals:   07/23/18 1100  BP: 108/63  Pulse: 82  Resp: 20  Temp: 98.7 F (37.1 C)  TempSrc: Oral  Weight: 132 lb 6.4 oz (60.1 kg)  Height: 5'  9" (1.753 m)   Body mass index is 19.55 kg/m.  Physical Exam  GENERAL APPEARANCE:  In no acute distress.  SKIN:  Sacral decubitus unstageable, has slough with black areas MOUTH and THROAT: Lips are without lesions. Oral mucosa is moist and without lesions. Tongue is normal in shape, size, and color and without lesions RESPIRATORY: Breathing is even & unlabored, BS CTAB CARDIAC: RRR, no murmur,no extra heart sounds, no edema GI: Abdomen soft, normal BS, no masses, no tenderness GU:  Has foley cath draining to urine bag with yellowish urine. EXTREMITIES:  No cyanosis NEUROLOGICAL: There is no tremor. Speech is clear PSYCHIATRIC: Alert to self, disoriented to time and place. Affect and behavior are  appropriate   Labs reviewed:  Recent Labs    12/06/17 0608  02/27/18 0422  05/30/18 0513  07/05/18 1803 07/05/18 2019 07/06/18 0438 07/22/18  NA 136   < > 136   < > 136  --  131*  --  132* 132*  K 4.6   < > 3.4*   < > 2.7*   < > 2.5*  --  3.9 4.7  CL 96*   < > 95*   < > 95*  --  85*  --  91*  --   CO2 30   < > 31   < > 32  --  33*  --  24  --   GLUCOSE 117*   < > 95   < > 81  --  134*  --  137*  --   BUN 18   < > 14   < > <5*  --  13  --  14 13  CREATININE 0.92   < > 0.98   < > 0.85  --  1.06  --  1.05 0.9  CALCIUM 9.3   < > 8.5*   < > 8.1*  --  8.7*  --  8.7*  --   MG 2.3   < > 1.8  --  1.8  --   --  2.1  --   --   PHOS 4.3  --   --   --   --   --   --   --   --   --    < > = values in this interval not displayed.   Recent Labs    12/06/17 0608 02/24/18 1805 05/29/18 0421  AST 38 69* 18  ALT 36 86* 10  ALKPHOS 100 101 94  BILITOT 0.9 1.0 1.0  PROT 6.7 5.9* 5.4*  ALBUMIN 3.8 2.4* 2.5*   Recent Labs    05/28/18 1051 05/29/18 0421 07/05/18 1803 07/06/18 0438 07/22/18  WBC 14.4* 12.2* 19.0* 9.7 17.8  NEUTROABS 9.5*  --  13.8*  --  13  HGB 13.2 12.7* 12.7* 13.4 11.7*  HCT 40.6 39.6 40.6 43.6 37*  MCV 89.4 90.0 83.0 86.0  --   PLT 299 303 467* 430* 407*   Lab  Results  Component Value Date   TSH 2.090 12/06/2017   Lab Results  Component Value Date   HGBA1C 5.8 (H) 01/04/2017   Lab Results  Component Value Date   CHOL 95 01/04/2017   HDL 45 01/04/2017   LDLCALC 41 01/04/2017   TRIG 45 01/04/2017   CHOLHDL 2.1 01/04/2017    Significant Diagnostic Results in last 30 days:  Dg Chest 2 View  Result Date: 07/05/2018 CLINICAL DATA:  Unwitnessed fall. EXAM: CHEST - 2 VIEW COMPARISON:  Radiographs of May 28, 2018. FINDINGS: The heart size and mediastinal contours are within normal limits. Both lungs are clear. The visualized skeletal structures are unremarkable. IMPRESSION: No active cardiopulmonary disease. Electronically Signed   By: Marijo Conception, M.D.   On: 07/05/2018 18:45   Dg Pelvis 1-2 Views  Result Date: 07/05/2018 CLINICAL DATA:  Unwitnessed fall. EXAM: PELVIS - 1-2 VIEW COMPARISON:  None. FINDINGS: There is no evidence of pelvic fracture or diastasis. No pelvic bone lesions are seen. IMPRESSION: Negative. Electronically Signed   By: Marijo Conception, M.D.   On: 07/05/2018 18:46   Ct Head Wo Contrast  Result Date: 07/05/2018 CLINICAL DATA:  Unwitnessed fall. High clinical risk cervical spine trauma  EXAM: CT HEAD WITHOUT CONTRAST CT CERVICAL SPINE WITHOUT CONTRAST TECHNIQUE: Multidetector CT imaging of the head and cervical spine was performed following the standard protocol without intravenous contrast. Multiplanar CT image reconstructions of the cervical spine were also generated. COMPARISON:  05/28/2018 FINDINGS: CT HEAD FINDINGS Brain: No evidence of acute infarction, hemorrhage, hydrocephalus, extra-axial collection or mass lesion/mass effect. Vascular: Atherosclerotic calcification Skull: Negative for fracture Sinuses/Orbits: No evidence of injury. Bilateral cataract resection. Chronic sinusitis with extensive mucosal thickening in the frontal, ethmoid, and right more than left maxillary sinuses. Chronic left partial mastoid  opacification with negative nasopharynx. CT CERVICAL SPINE FINDINGS Alignment: No traumatic malalignment Skull base and vertebrae: Negative for fracture Soft tissues and spinal canal: No prevertebral fluid or swelling. No visible canal hematoma. Disc levels: Extensive disc and facet degeneration with multilevel foraminal stenosis. Retro dental ligamentous thickening and calcification without erosion. Upper chest: Negative IMPRESSION: No evidence of acute intracranial or cervical spine injury Electronically Signed   By: Monte Fantasia M.D.   On: 07/05/2018 19:06   Ct Cervical Spine Wo Contrast  Result Date: 07/05/2018 CLINICAL DATA:  Unwitnessed fall. High clinical risk cervical spine trauma EXAM: CT HEAD WITHOUT CONTRAST CT CERVICAL SPINE WITHOUT CONTRAST TECHNIQUE: Multidetector CT imaging of the head and cervical spine was performed following the standard protocol without intravenous contrast. Multiplanar CT image reconstructions of the cervical spine were also generated. COMPARISON:  05/28/2018 FINDINGS: CT HEAD FINDINGS Brain: No evidence of acute infarction, hemorrhage, hydrocephalus, extra-axial collection or mass lesion/mass effect. Vascular: Atherosclerotic calcification Skull: Negative for fracture Sinuses/Orbits: No evidence of injury. Bilateral cataract resection. Chronic sinusitis with extensive mucosal thickening in the frontal, ethmoid, and right more than left maxillary sinuses. Chronic left partial mastoid opacification with negative nasopharynx. CT CERVICAL SPINE FINDINGS Alignment: No traumatic malalignment Skull base and vertebrae: Negative for fracture Soft tissues and spinal canal: No prevertebral fluid or swelling. No visible canal hematoma. Disc levels: Extensive disc and facet degeneration with multilevel foraminal stenosis. Retro dental ligamentous thickening and calcification without erosion. Upper chest: Negative IMPRESSION: No evidence of acute intracranial or cervical spine injury  Electronically Signed   By: Monte Fantasia M.D.   On: 07/05/2018 19:06    Assessment/Plan  1. Decubitus ulcer, unstageable with infection ([place]) -continue treatment daily, turn to sides, will start doxycycline 100 mg 1 tab twice a day x2 weeks and Florastor 250 mg 1 capsule twice a day x17 days   2. Poor appetite - will start Remeron 15 mg 1 tab PO daily and magic cup BID, continue multivitamins with minerals daily   3. Hypokalemia - will decrease KCL ER from 20 meq to 10 meq 1 tab daily Lab Results  Component Value Date   K 4.7 07/22/2018      Family/ staff Communication: Discussed plan of care with patient and charge nurse.  Labs/tests ordered: CBC on 07/27/2018  Goals of care:   Short-term rehabilitation.  Durenda Age, NP [facility] [phone] (Monday-Friday 8:00 a.m. - 5:00 p.m.) [phone] (after hours)

## 2018-07-27 LAB — CBC AND DIFFERENTIAL
HCT: 30 — AB (ref 41–53)
Hemoglobin: 10 — AB (ref 13.5–17.5)
Neutrophils Absolute: 9
PLATELETS: 561 — AB (ref 150–399)
WBC: 13.9

## 2018-08-06 ENCOUNTER — Encounter: Payer: Self-pay | Admitting: Internal Medicine

## 2018-08-06 ENCOUNTER — Non-Acute Institutional Stay (SKILLED_NURSING_FACILITY): Payer: Medicare HMO | Admitting: Internal Medicine

## 2018-08-06 DIAGNOSIS — R630 Anorexia: Secondary | ICD-10-CM | POA: Diagnosis not present

## 2018-08-06 DIAGNOSIS — R21 Rash and other nonspecific skin eruption: Secondary | ICD-10-CM

## 2018-08-06 DIAGNOSIS — R319 Hematuria, unspecified: Secondary | ICD-10-CM

## 2018-08-06 NOTE — Patient Instructions (Signed)
See assessment and plan acutely for this visit ? ?

## 2018-08-06 NOTE — Progress Notes (Signed)
NURSING HOME LOCATION:  [facility] ROOM NUMBER:  [id number]  CODE STATUS:  DNR  PCP:  Gregor Hams, [place] [address]  This is a nursing facility follow up of tissue wounds hips bilaterally as well as a new lesion at the left scapular area as per wound care.  Interim medical record and care since last [place] visit was updated with review of diagnostic studies and change in clinical status since last visit were documented.  HPI: He has history of osteomyelitis and the family is requesting referral to infectious disease.  Apparently this is pending.  Wound Care Nurse feels that his skin lesions are progressive due to malnutrition resulting in "skin failure".  Appetite has been marginal resulting in protein/caloric malnutrition.  Staff has discussed Hospice with the family as he is DNR; but apparently they have declined this based on negative prior experience. Significantly he has a history of bladder and prostate cancer. Urinalysis has demonstrated Hgb & RBCs consistently.  Review of systems: States that his appetite has never been good.  Denies any pain.  Although his urine in the Foley catheter and bag has a dark pink color, he denies any genitourinary symptoms.  Constitutional: No fever, significant weight change, fatigue  Cardiovascular: No chest pain, palpitations, paroxysmal nocturnal dyspnea, claudication, edema  Respiratory: No cough, sputum production, hemoptysis, DOE, significant snoring, apnea   Gastrointestinal: No heartburn, dysphagia, abdominal pain, nausea /vomiting, rectal bleeding, melena, change in bowels Genitourinary: No dysuria, frank hematuria, pyuria, incontinence, nocturia Musculoskeletal: No joint stiffness, joint swelling, weakness, pain Dermatologic: No new rash, pruritus, change in appearance of skin Neurologic: No dizziness, headache, syncope, seizures, numbness, tingling Psychiatric: No significant anxiety,  depression, insomnia, anorexia Endocrine: No change in hair/skin/nails, excessive thirst, excessive hunger, excessive urination  Hematologic/lymphatic: No significant bruising, lymphadenopathy, abnormal bleeding   Physical exam:  Pertinent or positive findings: Nasal oxygen in place. He is markedly hard of hearing.  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  He has poor dentition with stained teeth with plaque.  Breath sounds are decreased.  Heart sounds are markedly distant. Resting tremor of the right hand.  Lower extremities reveal atrophy and are flexed.  As noted urine is dark pink.  The Foley is draining well.  He has  large deep tissue ulcers of the hips.  There is an irregular geographic erythematous rash  over the left mid posterolateral thorax in a dermatome type distribution.  There is also an area of pressure necrosis without frank breakdown within this dermatomal lesion.  No vesicles are noted.  General appearance: no acute distress, increased work of breathing is present.   Lymphatic: No lymphadenopathy about the head, neck, axilla. Eyes: No conjunctival inflammation or lid edema is present. There is no scleral icterus. Ears:  External ear exam shows no significant lesions or deformities.   Nose:  External nasal examination shows no deformity or inflammation. Nasal mucosa are pink and moist without lesions, exudates Oral exam:  Lips and gums are healthy appearing. There is no oropharyngeal erythema or exudate. Neck:  No thyromegaly, masses, tenderness noted.    Heart:  No gallop, murmur, click, rub .  Lungs:  without wheezes, rhonchi, rales, rubs. Abdomen: Bowel sounds are normal. Abdomen is soft and nontender with no organomegaly, hernias, masses. GU: Deferred  Extremities:  No cyanosis, clubbing, edema  Skin: Warm & dry w/o tenting.  See summary under each active problem in the Problem List with  associated updated therapeutic plan

## 2018-08-07 ENCOUNTER — Encounter: Payer: Self-pay | Admitting: Internal Medicine

## 2018-08-11 ENCOUNTER — Non-Acute Institutional Stay (SKILLED_NURSING_FACILITY): Payer: Medicare HMO | Admitting: Adult Health

## 2018-08-11 ENCOUNTER — Encounter: Payer: Self-pay | Admitting: Adult Health

## 2018-08-11 DIAGNOSIS — L89154 Pressure ulcer of sacral region, stage 4: Secondary | ICD-10-CM

## 2018-08-11 DIAGNOSIS — M869 Osteomyelitis, unspecified: Secondary | ICD-10-CM | POA: Diagnosis not present

## 2018-08-11 DIAGNOSIS — R630 Anorexia: Secondary | ICD-10-CM

## 2018-08-11 DIAGNOSIS — I5022 Chronic systolic (congestive) heart failure: Secondary | ICD-10-CM

## 2018-08-11 DIAGNOSIS — E876 Hypokalemia: Secondary | ICD-10-CM | POA: Diagnosis not present

## 2018-08-11 DIAGNOSIS — B029 Zoster without complications: Secondary | ICD-10-CM

## 2018-08-11 DIAGNOSIS — J9611 Chronic respiratory failure with hypoxia: Secondary | ICD-10-CM

## 2018-08-11 DIAGNOSIS — G8929 Other chronic pain: Secondary | ICD-10-CM

## 2018-08-11 LAB — HEPATIC FUNCTION PANEL
ALT: 31 (ref 10–40)
AST: 45 — AB (ref 14–40)
Alkaline Phosphatase: 170 — AB (ref 25–125)
Bilirubin, Total: 0.5

## 2018-08-11 LAB — CBC AND DIFFERENTIAL
HCT: 34 — AB (ref 41–53)
HEMOGLOBIN: 11 — AB (ref 13.5–17.5)
Neutrophils Absolute: 20
PLATELETS: 563 — AB (ref 150–399)
WBC: 25.5

## 2018-08-11 LAB — BASIC METABOLIC PANEL
BUN: 18 (ref 4–21)
Creatinine: 0.9 (ref 0.6–1.3)
GLUCOSE: 118
Potassium: 5 (ref 3.4–5.3)
Sodium: 131 — AB (ref 137–147)

## 2018-08-11 NOTE — Progress Notes (Signed)
Location:  [name] Room Number: 409-W Place of Service:  SNF (31) Provider:  Durenda Age, NP  Patient Care Team: Gregor Hams, FNP as PCP - General (Family Medicine) [name], MD as Rounding Team (Internal Medicine)  Extended Emergency Contact Information Primary Emergency Contact: Debarr,Barbara Address: [address]          [place], [place] [address] Johnnette Litter of [name] Phone: [phone] Relation: Spouse Secondary Emergency Contact: Valora Corporal Mobile Phone: [phone] Relation: Son  Code Status:  DNR  Goals of care: Advanced Directive information Advanced Directives 07/13/2018  Does Patient Have a Medical Advance Directive? Yes  Type of Advance Directive Out of facility DNR (pink MOST or yellow form)  Does patient want to make changes to medical advance directive? No - Patient declined  Would patient like information on creating a medical advance directive? No - Patient declined  Pre-existing out of facility DNR order (yellow form or pink MOST form) Pink MOST form placed in chart (order not valid for inpatient use)     Chief Complaint  Patient presents with  . Medical Management of Chronic Issues    Routine [facility] visit    HPI:  Pt is an 84 y.o. male seen today for medical management of chronic diseases.  He is a short-term rehabilitation resident of [facility].  He has a PMH of chronic hypoxic respiratory failure, nonobstructive CAD, essential hypertension, memory deficit, history of ventricular tachycardia, sleep apnea, and history of bladder/prostate cancer. He had recently finished Doxycycline 100 mg BID X 2 weeks. Bone biopsy done was + for osteomyelitis. He is currently being scheduled for Infectious Disease consultation. He continues to have poor appetite according to CNA.   He has been admitted to [facility] on 07/11/18 after a recent  hospitalization due to sacral decubitus with some erythema and evidence of recurrent UTI and hypokalemia. He was evaluated by surgery and recommended no surgery. He was put on Keflex which has already been completed.    Past Medical History:  Diagnosis Date  . A-fib ([place])   . AKI (acute kidney injury) ([place])   . Arthritis   . Benign hypertension with chronic kidney disease, stage III ([place]) 11/28/2015  . CAD (coronary artery disease) 05/31/2016   cath 02/28/11: dLM 10%, pLAD 20%, pRCA calcified nodule 50% (FFR 0.86 - not significant), mRCA 40-50%, mildly elevated filling pressures, EF 50-55%  . Cataract   . CKD (chronic kidney disease), stage III ([place]) 11/28/2015   follows up with Dr. Clydell Hakim Last creatinine 1.36, EGFR 50  . Dyslipidemia   . First degree AV block 06/28/2013  . Gait abnormality 05/26/2017  . Glaucoma   . Hard of hearing   . Hyperlipidemia 11/28/2015   Last Assessment & Plan: Lipids were at goal a year ago; continue statin  . Hypertension    LOV with EKG 06/11/11 Dr Caryl Comes  [facility]  . Malignant neoplasm of prostate [facility])    surgery and radiation  . Malignant neoplasm of urinary bladder ([place]) 07/01/2012   surgery and chemo  . Memory difficulty 05/26/2017  . NSVT (nonsustained ventricular tachycardia) ([place]) 11/28/2015   Overview: Overview: Overview: echo 6/12: EF 55%, basal inferolat AK, mild LVH Last Assessment & Plan: resolved Overview: echo 6/12: EF 55%, basal inferolat AK, mild LVH Last Assessment & Plan: resolved  . Paroxysmal ventricular tachycardia ([place]) 05/31/2016   Overview: Overview: echo 6/12: EF 55%, basal inferolat AK, mild LVH Last Assessment &  Plan: resolved  . Prostate cancer ([place])    status post brachytherapy---in 2003  . PVC (premature ventricular contraction)   . Sleep apnea    STOP BANG SCORE 4  . Ventricular bigeminy 05/31/2016   Overview: Overview: rhythmol Date PR QRS 6/12 23 088 10/13 24 110 10/14 31 109 Last Assessment & Plan: The patient is without  symptomatic oral echocardiographic ectopy. His exercise tolerance is quite good. I am concerned about the interval prolongation of his PR interval. We will follow this closely. It is potentially related to the presence of his 1C antiarrhythmic and we may need to discontinue  . Ventricular tachycardia, nonsustained/bigeminy    echo 6/12: EF 55%, basal inferolat AK, mild LVH   Past Surgical History:  Procedure Laterality Date  . APPENDECTOMY  1951  . BLADDER SURGERY  2013   followed by chemo  . CARDIAC CATHETERIZATION  6/12  . CATARACT EXTRACTION W/ INTRAOCULAR LENS IMPLANT Right 2016   Right eye [facility] Dr. Lennox Pippins  . CATARACT EXTRACTION W/ INTRAOCULAR LENS IMPLANT Left 2014  . CORONARY ANGIOPLASTY     no significant blockage requiring stenting per patient  . CYSTOSCOPY  05/20/2012   Procedure: CYSTOSCOPY;  Surgeon: Alexis Frock, MD;  Location: [facility];  Service: Urology;  Laterality: N/A;  . CYSTOSCOPY  07/01/2012   Procedure: CYSTOSCOPY;  Surgeon: Molli Hazard, MD;  Location: [facility];  Service: Urology;  Laterality: N/A;  . DESCEMETS STRIPPING AUTOMATED ENDOTHELIAL KERATOPLASTY Right 06/13/2016   Procedure: DESCEMETS STRIPPING ENDOTHELIAL KERATOPLASTY; Surgeon: Marilynne Halsted, MD; Location: [facility] MAIN OR; Service: Ophthalmology; Laterality: Right;  . DESCEMETS STRIPPING AUTOMATED ENDOTHELIAL KERATOPLASTY Right 11/14/2016   Procedure: DESCEMETS STRIPPING ENDOTHELIAL KERATOPLASTY; Surgeon: Marilynne Halsted, MD; Location: [facility] MAIN OR; Service: Ophthalmology; Laterality: Right;  . EYE SURGERY     left cataract extraction with IOL  . PROSTATE SURGERY  2003   followed by radiation  . SKIN BIOPSY     BCC excised   . SKIN CANCER EXCISION     x 4  . TRANSURETHRAL RESECTION OF BLADDER TUMOR  05/20/2012   Procedure: TRANSURETHRAL RESECTION OF BLADDER TUMOR (TURBT);  Surgeon: Alexis Frock, MD;  Location: [facility];  Service: Urology;  Laterality: N/A;  [place]   . TRANSURETHRAL  RESECTION OF BLADDER TUMOR  07/01/2012   Procedure: TRANSURETHRAL RESECTION OF BLADDER TUMOR (TURBT);  Surgeon: Molli Hazard, MD;  Location: [facility];  Service: Urology;  Laterality: N/A;  Cystoscopy, TURBT, be prepared for Open Cystorraphy        Allergies  Allergen Reactions  . Neosporin [Neomycin-Bacitracin Zn-Polymyx] Other (See Comments)    Makes his skin red   . Penicillins Hives and Itching    Has patient had a PCN reaction causing immediate rash, facial/tongue/throat swelling, SOB or lightheadedness with hypotension: Yes Has patient had a PCN reaction causing severe rash involving mucus membranes or skin necrosis: No Has patient had a PCN reaction that required hospitalization: Unknown Has patient had a PCN reaction occurring within the last 10 years: Unknown If all of the above answers are "NO", then may proceed with Cephalosporin use.   Marland Kitchen Penicillin G Rash    Outpatient Encounter Medications as of 08/11/2018  Medication Sig  . acetaminophen (TYLENOL) 500 MG tablet Take 1,000 mg by mouth 3 (three) times daily.   Marland Kitchen acyclovir (ZOVIRAX) 400 MG tablet Take 400 mg by mouth 3 (three) times daily.  . barrier cream (NON-SPECIFIED) CREA Apply 1 application topically 3 (three) times daily as needed (  sacrum and groin redness).  . collagenase (SANTYL) ointment Apply topically daily. Apply Santyl to upper and lower sacrum wounds Q day, then cover with moist gauze dressing and foam dressing.  (Change foam dressing Q 3 days or PRN soiling.)  . furosemide (LASIX) 20 MG tablet Take 1 tablet (20 mg total) by mouth daily.  . mirtazapine (REMERON) 15 MG tablet Take 15 mg by mouth at bedtime.  Marland Kitchen morphine (ROXANOL) 20 MG/ML concentrated solution Place 5 mg under the tongue every 4 (four) hours as needed for severe pain.  . Multiple Vitamins-Minerals (MULTIVITAMIN WITH MINERALS) tablet Take 1 tablet by mouth daily.  Marland Kitchen NUTRITIONAL SUPPLEMENT LIQD Take 120 mLs by mouth 3 (three) times daily.  MedPass  . Nutritional Supplements (NUTRITIONAL SUPPLEMENT PO) Take 1 each by mouth 2 (two) times daily. Magic Cup  . potassium chloride (K-DUR) 10 MEQ tablet Take 1 tablet (10 mEq total) by mouth daily. Please take 20 mg oral twice daily for 5 days, then 20 mg oral daily for 5 days then stop.  . prednisoLONE acetate (PRED FORTE) 1 % ophthalmic suspension Place 1 drop into both eyes 2 (two) times daily as needed (allergies).    No facility-administered encounter medications on file as of 08/11/2018.     Review of Systems  GENERAL: No change in appetite, no fatigue, no weight changes, no fever, chills or weakness EARS:  Hard of hearing on both ears MOUTH and THROAT: Denies oral discomfort, gingival pain or bleeding, pain from teeth or hoarseness   RESPIRATORY: no cough, SOB, DOE, wheezing, hemoptysis CARDIAC: No chest pain, edema or palpitations GI: No abdominal pain, diarrhea, constipation, heart burn, nausea or vomiting NEUROLOGICAL: Denies dizziness, syncope, numbness, or headache PSYCHIATRIC: Denies feelings of depression or anxiety. No report of hallucinations, insomnia, paranoia, or agitation   Immunization History  Administered Date(s) Administered  . Influenza Inj Mdck Quad Pf 07/24/2016  . Influenza, High Dose Seasonal PF 07/24/2016, 08/22/2017, 06/24/2018  . Influenza,inj,Quad PF,6+ Mos 07/24/2016  . Tdap 02/11/2016   Pertinent  Health Maintenance Due  Topic Date Due  . PNA vac Low Risk Adult (1 of 2 - PCV13) 04/07/1994  . INFLUENZA VACCINE  Completed   Fall Risk  10/08/2017  Falls in the past year? No     Vitals:   08/11/18 1221  BP: 110/65  Pulse: 82  Resp: 17  Temp: (!) 97.4 F (36.3 C)  TempSrc: Oral  SpO2: 96%  Weight: 131 lb 3.2 oz (59.5 kg)  Height: 5' 9" (1.753 m)   Body mass index is 19.37 kg/m.  Physical Exam  GENERAL APPEARANCE:  In no acute distress.  SKIN:  Right and left hip with black necrotic tissues, sacral wound is stage 4 (has slough  and with black areas), bilateral thorax with erythematous rashes/blisters MOUTH and THROAT: Lips are without lesions. Oral mucosa is moist and without lesions. Tongue is normal in shape, size, and color and without lesions RESPIRATORY: Breathing is even & unlabored, BS CTAB, O2 @ 2L/min via [place] CARDIAC: RRR, no murmur,no extra heart sounds, no edema GI: Abdomen soft, normal BS, no masses, no tenderness GU:  Has foley catheter draining to urine bag with yellowish urine EXTREMITIES: BLE are contracted, able to move BUE NEUROLOGICAL: There is no tremor. Speech is clear. Alert to self, disoriented to time and place PSYCHIATRIC:  Affect and behavior are appropriate   Labs reviewed: Recent Labs    12/06/17 0608  02/27/18 0422  05/30/18 0513  07/05/18 1803 07/05/18  2019 07/06/18 0438 07/22/18  NA 136   < > 136   < > 136  --  131*  --  132* 132*  K 4.6   < > 3.4*   < > 2.7*   < > 2.5*  --  3.9 4.7  CL 96*   < > 95*   < > 95*  --  85*  --  91*  --   CO2 30   < > 31   < > 32  --  33*  --  24  --   GLUCOSE 117*   < > 95   < > 81  --  134*  --  137*  --   BUN 18   < > 14   < > <5*  --  13  --  14 13  CREATININE 0.92   < > 0.98   < > 0.85  --  1.06  --  1.05 0.9  CALCIUM 9.3   < > 8.5*   < > 8.1*  --  8.7*  --  8.7*  --   MG 2.3   < > 1.8  --  1.8  --   --  2.1  --   --   PHOS 4.3  --   --   --   --   --   --   --   --   --    < > = values in this interval not displayed.   Recent Labs    12/06/17 0608 02/24/18 1805 05/29/18 0421  AST 38 69* 18  ALT 36 86* 10  ALKPHOS 100 101 94  BILITOT 0.9 1.0 1.0  PROT 6.7 5.9* 5.4*  ALBUMIN 3.8 2.4* 2.5*   Recent Labs    05/29/18 0421 07/05/18 1803 07/06/18 0438 07/22/18 07/27/18  WBC 12.2* 19.0* 9.7 17.8 13.9  NEUTROABS  --  13.8*  --  13 9  HGB 12.7* 12.7* 13.4 11.7* 10.0*  HCT 39.6 40.6 43.6 37* 30*  MCV 90.0 83.0 86.0  --   --   PLT 303 467* 430* 407* 561*   Lab Results  Component Value Date   TSH 2.090 12/06/2017   Lab Results    Component Value Date   HGBA1C 5.8 (H) 01/04/2017   Lab Results  Component Value Date   CHOL 95 01/04/2017   HDL 45 01/04/2017   LDLCALC 41 01/04/2017   TRIG 45 01/04/2017   CHOLHDL 2.1 01/04/2017    Assessment/Plan  1. Osteomyelitis of other site, unspecified type ([place]) - sacral bone biopsy was positive for osteomyelitis, recently completed doxycycline treatment for 2 weeks (08/06/2018), referred to infectious disease and awaiting appointment schedule, will restart doxycycline 100 mg 1 tab twice daily x14 days and Florastor 250 mg 1 capsule twice daily x17 days, continue wound treatment daily, turn to sides   2. Hypokalemia -  Continue KCL ER 10 M EQ 1 tab daily Lab Results  Component Value Date   K 4.7 07/22/2018    3. Decubitus ulcer of sacral region, stage 4 (HCC) - and bilateral trochanters, continue wound treatments daily, turn to sides   4. Poor appetite -continues to have poor appetite, was recently started on mirtazapine 15 mg 1 tab nightly, Body mass index is 19.37 kg/m.  Continue med Pass 120 mL 3 times a day, Magic cup twice daily and multivitamins with minerals 1 tab daily   5. Chronic respiratory failure with hypoxia (HCC) -continue O2 at 2 L/minute  via [place]   6. Chronic systolic (congestive) heart failure (HCC) -no SOB, continue Lasix 20 mg 1 tab daily   7. Herpes zoster without complication -recently started on acyclovir 400 mg 1 tab 3 times a day (stop date 08/13/18)    8. Chronic pain -continue morphine 20 mg/mL 5 mg SL every 4 hours as needed and acetaminophen 500 mg 2 tabs = 1000 mg p.o. 3 times daily    Family/ staff Communication: Discussed plan of care with resident and treatment nurse.  Labs/tests ordered:  None  Goals of care:   Short-term rehabilitation.   Durenda Age, NP [facility] and Adult Medicine [phone] (Monday-Friday 8:00 a.m. - 5:00 p.m.) [phone] (after hours)

## 2018-08-17 LAB — CBC AND DIFFERENTIAL
HEMATOCRIT: 33 — AB (ref 41–53)
HEMOGLOBIN: 10.8 — AB (ref 13.5–17.5)
Neutrophils Absolute: 17
PLATELETS: 633 — AB (ref 150–399)
WBC: 23.2

## 2018-08-17 LAB — BASIC METABOLIC PANEL
BUN: 13 (ref 4–21)
CREATININE: 0.7 (ref 0.6–1.3)
Glucose: 83
Potassium: 5.1 (ref 3.4–5.3)
Sodium: 130 — AB (ref 137–147)

## 2018-08-21 ENCOUNTER — Other Ambulatory Visit: Payer: Self-pay

## 2018-08-21 MED ORDER — MORPHINE SULFATE (CONCENTRATE) 20 MG/ML PO SOLN: 5.0000 mg | ORAL | 0 refills | Status: AC | PRN

## 2018-08-21 NOTE — Telephone Encounter (Signed)
Hard script written by Monina Medina-Vargas, NP and given to Shree, nurse.  

## 2018-08-27 ENCOUNTER — Encounter: Payer: Self-pay | Admitting: Internal Medicine

## 2018-08-27 ENCOUNTER — Non-Acute Institutional Stay (SKILLED_NURSING_FACILITY): Payer: Medicare HMO | Admitting: Internal Medicine

## 2018-08-27 ENCOUNTER — Non-Acute Institutional Stay: Payer: Medicare HMO | Admitting: Primary Care

## 2018-08-27 DIAGNOSIS — L89154 Pressure ulcer of sacral region, stage 4: Secondary | ICD-10-CM

## 2018-08-27 DIAGNOSIS — J9611 Chronic respiratory failure with hypoxia: Secondary | ICD-10-CM

## 2018-08-27 DIAGNOSIS — R627 Adult failure to thrive: Secondary | ICD-10-CM

## 2018-08-27 DIAGNOSIS — Z515 Encounter for palliative care: Secondary | ICD-10-CM

## 2018-08-27 DIAGNOSIS — I5022 Chronic systolic (congestive) heart failure: Secondary | ICD-10-CM

## 2018-08-27 NOTE — Assessment & Plan Note (Addendum)
Multifactorial, mainly due to to inadequate nutrition and negative metabolic state Hospice care is the most humane course Family conference needs to be pursued as the present course will simply deplete any resources he and his wife have built for their retirement and will provide no medical benefit

## 2018-08-27 NOTE — Assessment & Plan Note (Addendum)
Wound Care nurse reports clinical osteomyelitis with exposure of the bone Patient is terminal and could not tolerate debridement as he is in severe pain simply being moved The advanced infection would not be expected to respond to [place] care is recommended ASAP

## 2018-08-27 NOTE — Progress Notes (Addendum)
[facility] Telephone: [phone] Fax: [phone]  PATIENT NAME: [name] DOB: [birth year] MRN: [id number]  PRIMARY CARE PROVIDER:  Hendricks Limes, MD  REFERRING PROVIDER: Hendricks Limes, [place] [address]  RESPONSIBLE PARTY:   Extended Emergency Contact Information Primary Emergency Contact: Breshears,Barbara Address: [address]          [place], [place] [address] Johnnette Litter of [place] Phone: [phone] Relation: Spouse Secondary Emergency Contact: Valora Corporal Mobile Phone: [phone] Relation: Son   ASSESSMENT and RECOMMENDATIONS:   1.Pain and skin failure: Recommend increasing morphine to 5-10 mg po q 4 hrs  With 5 mg q 4 hrs po prn  break through dosing for severe pain. Patient has extensive pain as evidence by 9d/10 on PAINAD  scale. Pain is from  sacral pressure injury with osteomyelitis stage 4,  bilateral trochanteric ulcers with eschar and deep tissue injury areas across his upper back as reported by facility wound nurse. He also has new developing pressure injury on his knee from side positioning to avoid sacral pressure. Current pain regimen is 5 mg b.i.d. of morphine with 5 mg Q for HPRN.   2 Protein Calorie Malnutrition Patient consumes few bites of food and sips of liquid of a regular diet. His weight is now 128.2 lbs, BMI=19 as measured by SNF staff.  One year ago his weight was 193 lb and bmi 30.2.  October 2019 weight= 163, bmi= 24. Multiple  Non healing wounds.  3. Goals of care. Patient currently has DNR in place dated in October 2019. Admit to hospice for management of end of life symptoms. Will complete MOST with POA's wishes for care. Phone discussion with patient's wife this evening after assessment. Previous Interaction with hospice at a facility had a negative impact on wife when patient was transported to the hospital without her knowledge, and incurred a hospital stay. She is reticent to take hospice  services for this reason. Education provided that she will be able to voice her goals of care to the hospice team. She wants no hospitalizations and all comfort care in place at [facility].  Discussed hospice services with comfort care as goal and availability of an inpatient unit only if needed for symptoms unable to be managed at the SNF.  Also educated wife in rationale for not using IVs and feeding tubes during end-of-life process. She voiced understanding and requested Hospice referral. Requests  that Hospice calls her tomorrow to set up initiation of services. Hospice at [facility] apprised of referral by Dr. Linna Darner.  I spent 90 minutes providing this consultation,  from 1530 to 1700. More than 50% of the time in this consultation was spent coordinating communication.   HISTORY OF PRESENT ILLNESS:  [name] is a 84 y.o. year old male with multiple medical problems including pressure injury of sacrum, CAD, Adult FTT, hyponatremia, chronic  respiratory failure, a fib, memory difficulty,  First degree heart block.Palliative Care was asked to help address goals of care.   CODE STATUS: DNR, no MOST on record PPS: 20% HOSPICE ELIGIBILITY/DIAGNOSIS:  Yes/protein calorie malnutrition, refer to hospice  PAST MEDICAL HISTORY:  Past Medical History:  Diagnosis Date  . A-fib ([place])   . AKI (acute kidney injury) ([place])   . Arthritis   . Benign hypertension with chronic kidney disease, stage III ([place]) 11/28/2015  . CAD (coronary artery disease) 05/31/2016   cath 02/28/11: dLM 10%, pLAD 20%, pRCA calcified nodule 50% (FFR 0.86 -  not significant), mRCA 40-50%, mildly elevated filling pressures, EF 50-55%  . Cataract   . CKD (chronic kidney disease), stage III ([place]) 11/28/2015   follows up with Dr. Clydell Hakim Last creatinine 1.36, EGFR 50  . Dyslipidemia   . First degree AV block 06/28/2013  . Gait abnormality 05/26/2017  . Glaucoma   . Hard of hearing   . Hyperlipidemia 11/28/2015   Last Assessment & Plan:  Lipids were at goal a year ago; continue statin  . Hypertension    LOV with EKG 06/11/11 Dr Caryl Comes  [facility]  . Malignant neoplasm of prostate [facility])    surgery and radiation  . Malignant neoplasm of urinary bladder ([place]) 07/01/2012   surgery and chemo  . Memory difficulty 05/26/2017  . NSVT (nonsustained ventricular tachycardia) ([place]) 11/28/2015   Overview: Overview: Overview: echo 6/12: EF 55%, basal inferolat AK, mild LVH Last Assessment & Plan: resolved Overview: echo 6/12: EF 55%, basal inferolat AK, mild LVH Last Assessment & Plan: resolved  . Paroxysmal ventricular tachycardia ([place]) 05/31/2016   Overview: Overview: echo 6/12: EF 55%, basal inferolat AK, mild LVH Last Assessment & Plan: resolved  . Prostate cancer ([place])    status post brachytherapy---in 2003  . PVC (premature ventricular contraction)   . Sleep apnea    STOP BANG SCORE 4  . Ventricular bigeminy 05/31/2016   Overview: Overview: rhythmol Date PR QRS 6/12 23 088 10/13 24 110 10/14 31 109 Last Assessment & Plan: The patient is without symptomatic oral echocardiographic ectopy. His exercise tolerance is quite good. I am concerned about the interval prolongation of his PR interval. We will follow this closely. It is potentially related to the presence of his 1C antiarrhythmic and we may need to discontinue  . Ventricular tachycardia, nonsustained/bigeminy    echo 6/12: EF 55%, basal inferolat AK, mild LVH    SOCIAL HX:  Social History   Tobacco Use  . Smoking status: Former Smoker    Types: Cigars    Last attempt to quit: 05/20/1995    Years since quitting: 23.2  . Smokeless tobacco: Current User    Types: Chew  Substance Use Topics  . Alcohol use: Not Currently    ALLERGIES:  Allergies  Allergen Reactions  . Neosporin [Neomycin-Bacitracin Zn-Polymyx] Other (See Comments)    Makes his skin red   . Penicillins Hives and Itching    Has patient had a PCN reaction causing immediate rash, facial/tongue/throat swelling, SOB  or lightheadedness with hypotension: Yes Has patient had a PCN reaction causing severe rash involving mucus membranes or skin necrosis: No Has patient had a PCN reaction that required hospitalization: Unknown Has patient had a PCN reaction occurring within the last 10 years: Unknown If all of the above answers are "NO", then may proceed with Cephalosporin use.   Marland Kitchen Penicillin G Rash     PERTINENT MEDICATIONS:  Outpatient Encounter Medications as of 08/27/2018  Medication Sig  . acetaminophen (TYLENOL) 500 MG tablet Take 1,000 mg by mouth 3 (three) times daily.   . barrier cream (NON-SPECIFIED) CREA Apply 1 application topically 3 (three) times daily as needed (sacrum and groin redness).  . collagenase (SANTYL) ointment Apply topically daily. Apply Santyl to upper and lower sacrum wounds Q day, then cover with moist gauze dressing and foam dressing.  (Change foam dressing Q 3 days or PRN soiling.)  . furosemide (LASIX) 20 MG tablet Take 1 tablet (20 mg total) by mouth daily.  . mirtazapine (REMERON) 15 MG tablet Take 15  mg by mouth at bedtime.  Marland Kitchen morphine (ROXANOL) 20 MG/ML concentrated solution Place 0.25 mLs (5 mg total) under the tongue every 4 (four) hours as needed for severe pain.  . Multiple Vitamins-Minerals (MULTIVITAMIN WITH MINERALS) tablet Take 1 tablet by mouth daily.  Marland Kitchen NUTRITIONAL SUPPLEMENT LIQD Take 120 mLs by mouth 3 (three) times daily. MedPass  . Nutritional Supplements (NUTRITIONAL SUPPLEMENT PO) Take 1 each by mouth 2 (two) times daily. Magic Cup  . potassium chloride (K-DUR) 10 MEQ tablet Take 1 tablet (10 mEq total) by mouth daily. Please take 20 mg oral twice daily for 5 days, then 20 mg oral daily for 5 days then stop.  . prednisoLONE acetate (PRED FORTE) 1 % ophthalmic suspension Place 1 drop into both eyes 2 (two) times daily as needed (allergies).    No facility-administered encounter medications on file as of 08/27/2018.     PHYSICAL EXAM:  VS 96.9-76-20  124/84  wt 128 Ht 5'8" BMI  19.5  Sat 2.5 L oxygen = 95%,   General: NAD, very ill  appearing, thin Cardiovascular: regular rate and rhythm, S1S2, radial pulse 48,apical 76 Pulmonary: clear ant fields, difficult to auscultate, no cough Abdomen: soft, nontender, + bowel sounds Extremities: no edema, LE contractures, rigidity and resistance to moving UE. Skin: sacral stage 4 9 x 5 cm decubitus, bil trochanteric unstageable wounds, upper rightt back with  deep pressure injury area, left medial knee stage 2 ,4 cm round. Neurological: Weakness, severely painful 9/10 PAINAD, Oriented x 1, (states he is in a hospital, it is summer, 1919).  Cyndia Skeeters DNP, AGPCNP-BC

## 2018-08-27 NOTE — Progress Notes (Signed)
   NURSING HOME LOCATION:  [facility] ROOM NUMBER:  [id number]  CODE STATUS:  DNR  PCP:  Gregor Hams, [place] [address]  This is a nursing facility follow up for specific acute issue of uncontrolled pain related to progressive decubiti & osteomyelitis.  Interim medical record and care since last [place] visit was updated with review of diagnostic studies and change in clinical status since last visit were documented.  HPI: Palliative Care has requested increase in pain meds & consideration for Hospice ASAP.The patient's multiple ulcers are progressing and he is developing progressive skin breakdown as well.  He has intractable pain simply being moved.  He is presently on 5 mg of morphine twice daily and 5 mg every 4 hours as needed. Tentative plan was to have him seen by ID to assess any possible intervention for the osteomyelitis.  Wound Care Nurse states that the skin lesions are progressing and now also now include ecchymotic areas with breakdown at the shoulders as well as shallow ulcer of the left medial knee. Apparently his wife has wanted to keep him in a large private room and has actually paid for the month of December to allow this.  Apparently the wife and son do understand that he is terminal, but this is all secondhand information. Apparently the present situation has some relationship to insurance coverage as to whether he is in the skilled facility or under Hospice care.  Review of systems: Staff reports profound anorexia with minimal oral intake.  They also describe the fact that he cannot be moved without exhibiting profound pain.  Review of systems cannot be completed as he is very lethargic and also hard of hearing.  He has difficulty comprehending questions.  He seemed to indicate that the year was 1919.  Physical exam:  Pertinent or positive findings: He appears profoundly cachectic with temporal wasting as well as atrophy  of the extremities, especially the lower legs.  His voiced responses consist of intermittent groaning with a Museum/gallery conservator.  Oral exam is limited but he is missing lower anterior mandibular teeth.  The right eye is small which appears to be a postoperative complication.  He has bronchovesicular breath sounds.  The heart cannot be auscultated.  He has flexions contractures of the lower extremities.  Deep ulcers are present over both hips which are dressed.  There is a shallow ulcer at the left medial knee.  1+ pitting edema is present.  Pedal pulses cannot be palpated.  General appearance: no acute distress @ rest,no increased work of breathing is present.   Lymphatic: No lymphadenopathy about the head, neck, axilla. Eyes: No conjunctival inflammation or lid edema is present. There is no scleral icterus. Ears:  External ear exam shows no significant lesions or deformities.   Nose:  External nasal examination shows no deformity or inflammation. Nasal mucosa are pink and moist without lesions, exudates Neck:  No thyromegaly, masses, tenderness noted.    Lungs:  without wheezes, rhonchi, rales, rubs. Abdomen: Bowel sounds are decreased. Abdomen is soft and nontender with no organomegaly, hernias, masses. GU: Deferred  Extremities:  No cyanosis, clubbing Neurologic exam : Strength equal  in upper & lower extremities & balance, Rhomberg, finger to nose testing could not be completed due to clinical state Skin: Warm & dry . No significant  rash.  See summary under each active problem in the Problem List with associated updated therapeutic plan

## 2018-08-28 ENCOUNTER — Encounter: Payer: Self-pay | Admitting: Internal Medicine

## 2018-08-28 NOTE — Patient Instructions (Signed)
See assessment and plan under each diagnosis in the problem list and acutely for this visit 

## 2018-09-01 ENCOUNTER — Ambulatory Visit: Admitting: Internal Medicine

## 2018-09-04 ENCOUNTER — Telehealth: Payer: Self-pay | Admitting: Primary Care

## 2018-09-04 NOTE — Telephone Encounter (Signed)
Advised by SNF staff patient was admitted to hospice on 08/29/18 and expired at [facility] on 2018-09-18  At 1748.

## 2018-09-16 DEATH — deceased

## 2019-04-19 IMAGING — CT CT HEAD W/O CM
3 of 4 series · 14 of 47 positions shown, 16 images · non-contrast
Comparison: 02/11/2016

CLINICAL DATA: Increasing weakness for 4 days. Dizziness. History
of bladder cancer and prostate cancer. Hypertension.

EXAM:
CT HEAD WITHOUT CONTRAST
TECHNIQUE: Contiguous axial images were obtained from the base of the skull
through the vertex without intravenous contrast.

[Series 2: head w/o · axial · non-contrast · 0.45mm/px · z∈[+1332,+1462]mm · 8 of 32 slices shown, 10 images]
[im 3/32  brain]
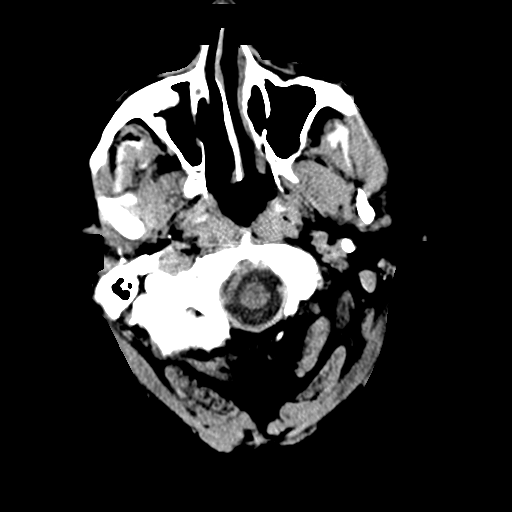
[im 3/32  bone]
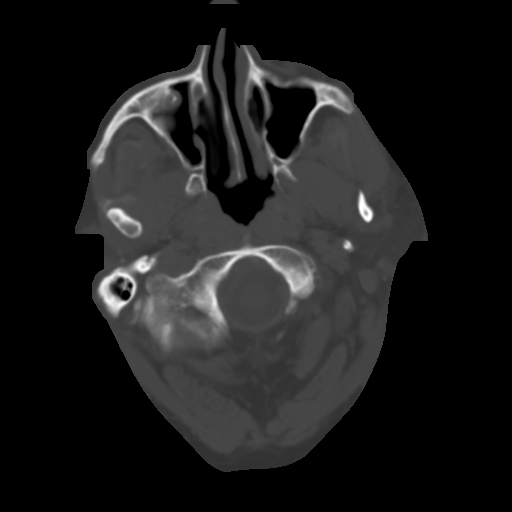
[im 7/32  brain]
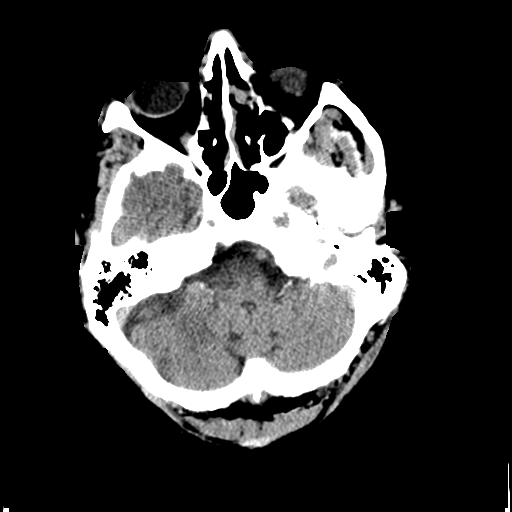
[im 12/32  brain]
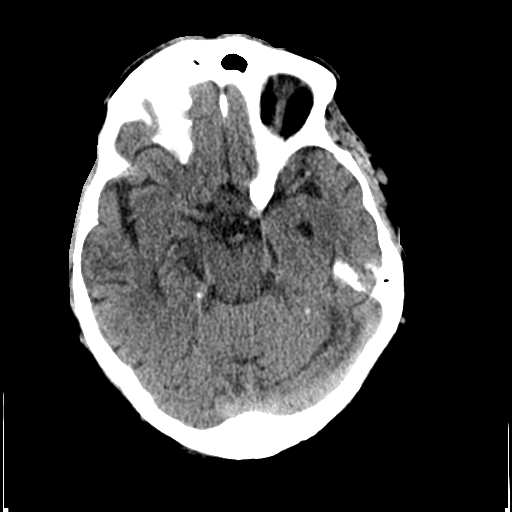
[im 14/32  brain]
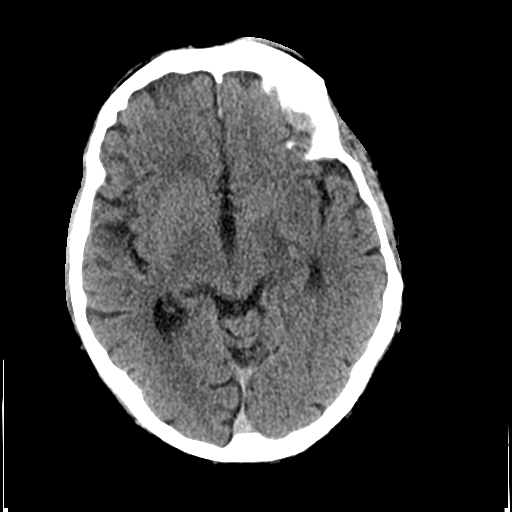
[im 18/32  brain]
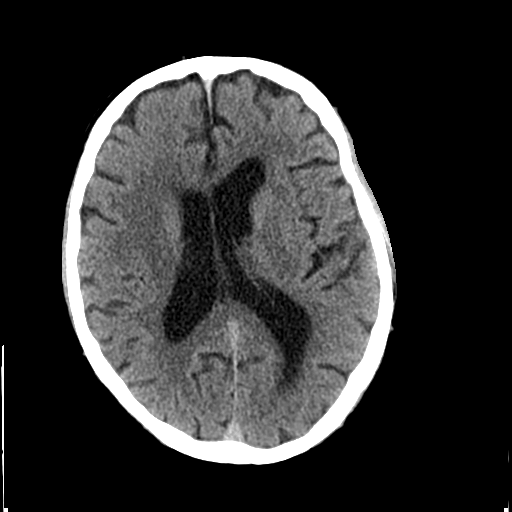
[im 18/32  bone]
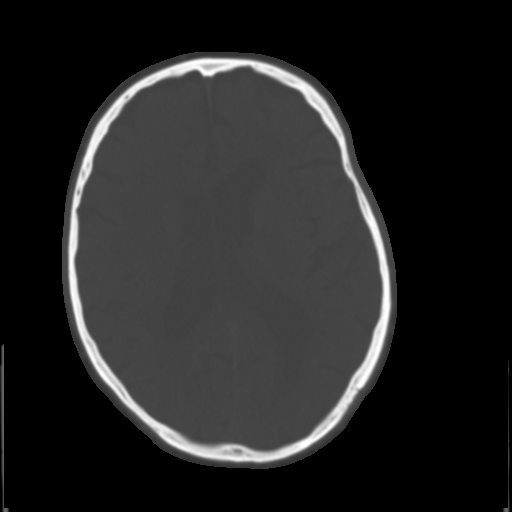
[im 20/32  brain]
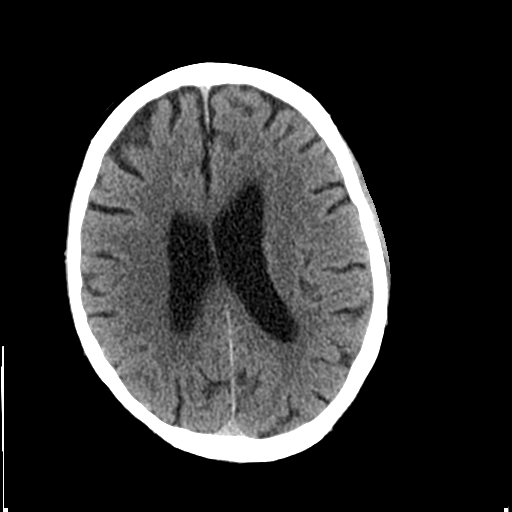
[im 25/32  brain]
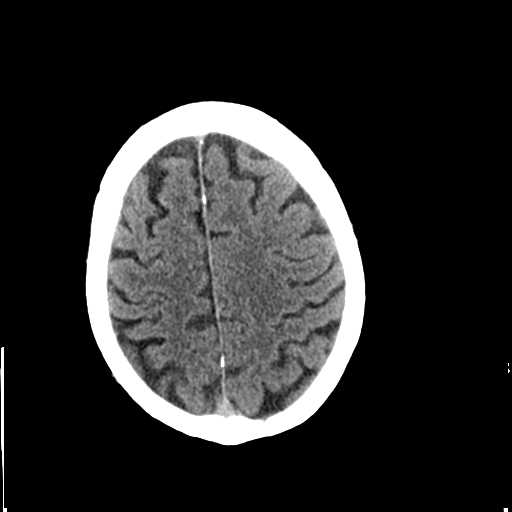
[im 29/32  brain]
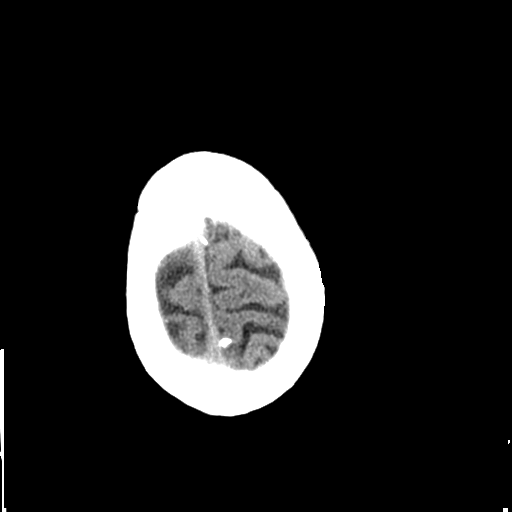

[Series 4: coronal · coronal · 0.30mm/px · 3 of 64 slices shown]
[im 22/64  brain]
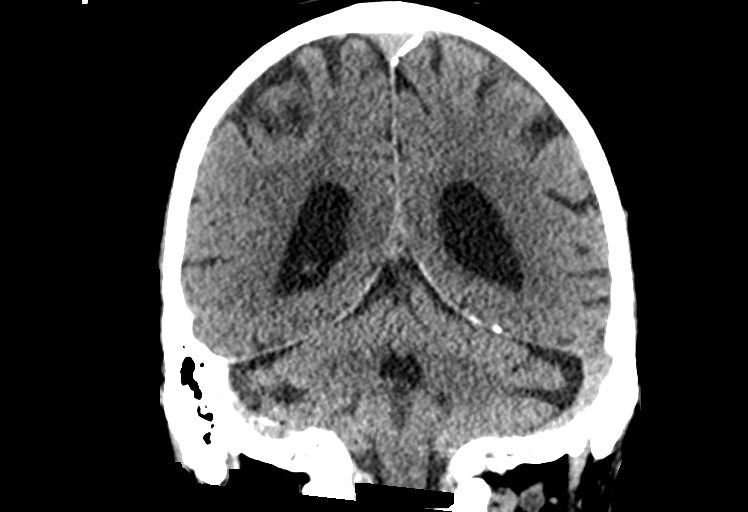
[im 29/64  brain]
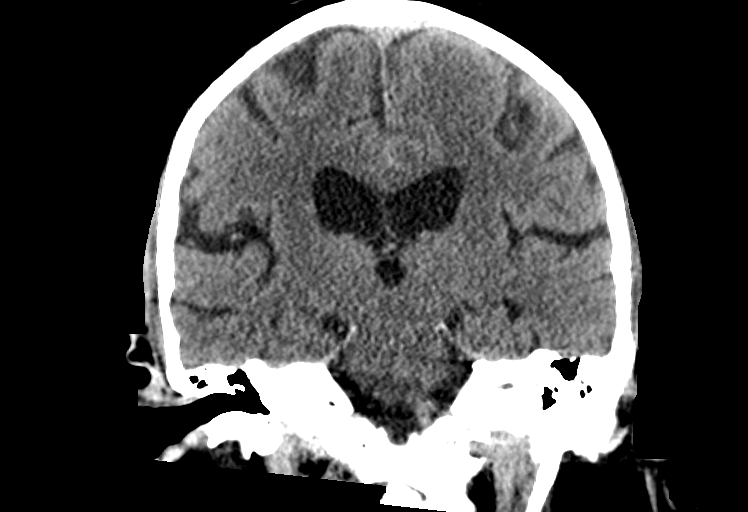
[im 36/64  brain]
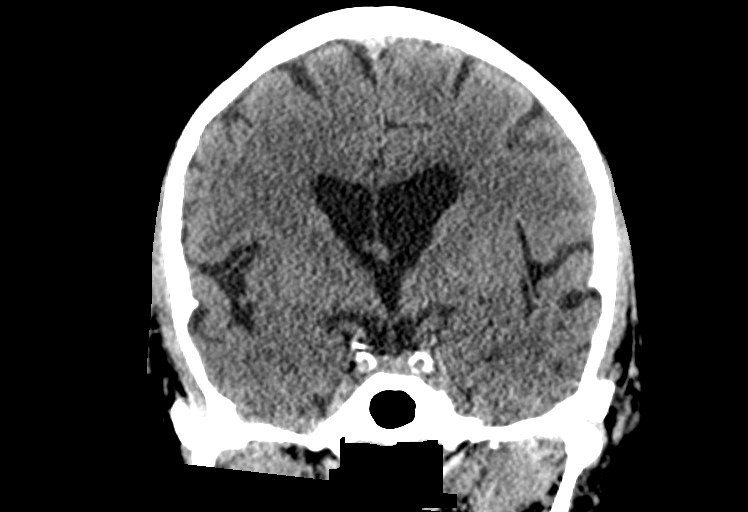

[Series 5: sagittal · sagittal · 0.29mm/px · 3 of 50 slices shown]
[im 18/50  brain]
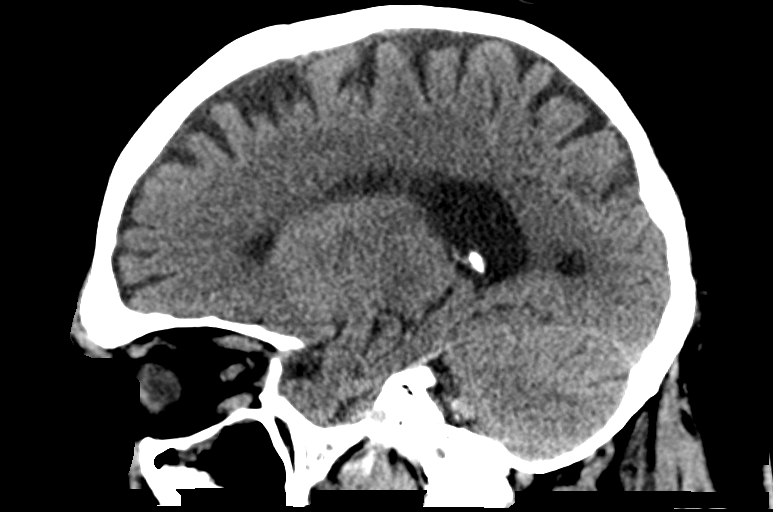
[im 25/50  brain]
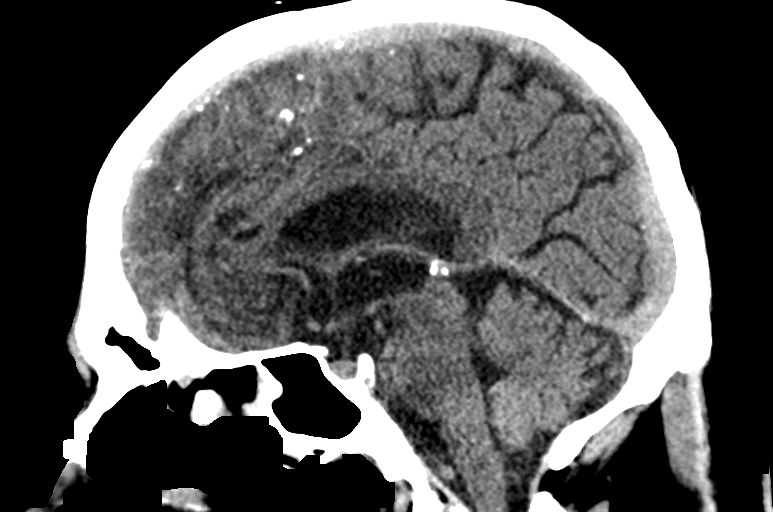
[im 32/50  brain]
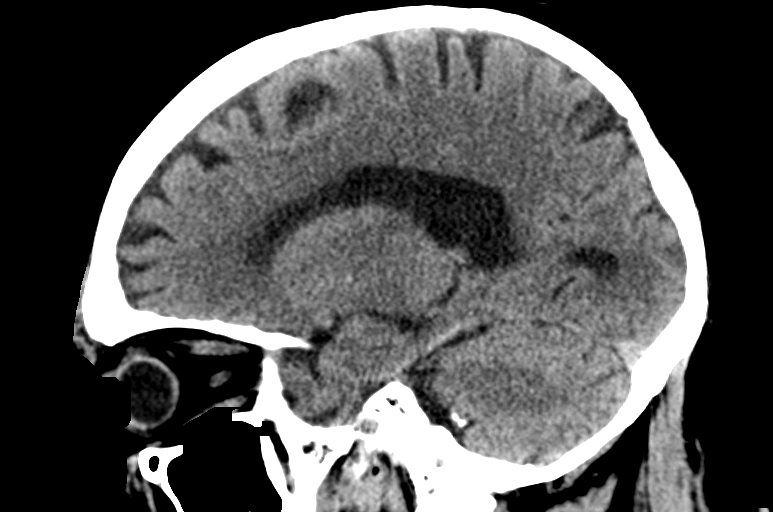

[14 of 47 positions shown; findings below may reference images not displayed]

FINDINGS: Brain: Mild diffuse cerebral atrophy. Ventricular dilatation
consistent with central atrophy. Low-attenuation changes in the deep
white matter consistent with small vessel ischemia. No mass effect
or midline shift. No abnormal extra-axial fluid collections.
Gray-white matter junctions are distinct. Basal cisterns are not
effaced. No acute intracranial hemorrhage.

Vascular: Intracranial arterial vascular calcifications are present.

Skull: No depressed skull fractures.

Sinuses/Orbits: Mucosal thickening in the paranasal sinuses. No
acute air-fluid levels. Mastoid air cells are not opacified.

Other: No significant changes in the intracranial contents since
previous study.
IMPRESSION: No acute intracranial abnormalities. Mild chronic atrophy and small
vessel ischemic changes.

## 2020-10-18 IMAGING — CT CT CERVICAL SPINE W/O CM
4 of 7 series · 15 of 33 positions shown, 16 images · non-contrast
Comparison: 05/28/2018

CLINICAL DATA: Unwitnessed fall. High clinical risk cervical spine
trauma

EXAM:
CT HEAD WITHOUT CONTRAST
CT CERVICAL SPINE WITHOUT CONTRAST
TECHNIQUE: Multidetector CT imaging of the head and cervical spine was
performed following the standard protocol without intravenous
contrast. Multiplanar CT image reconstructions of the cervical spine
were also generated.

[Series 7: head 3.0 mpr cor · coronal · 0.33mm/px · 3 of 71 slices shown]
[im 18/71  bone]
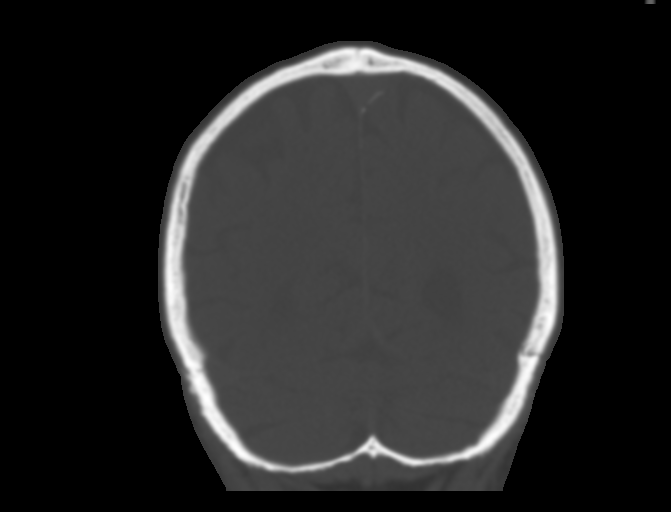
[im 36/71  bone]
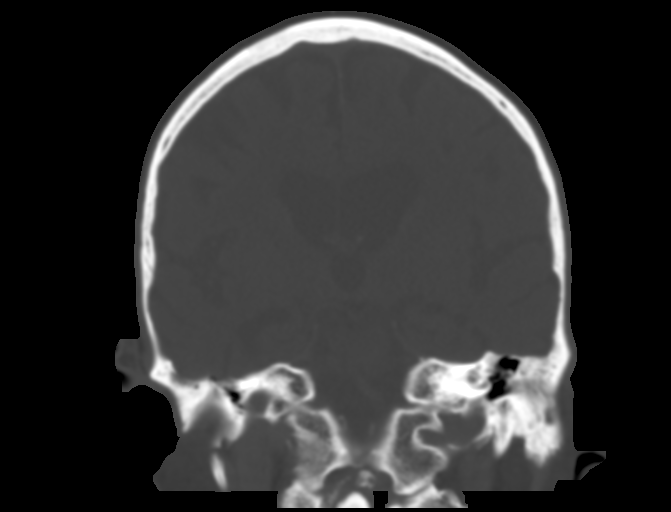
[im 53/71  bone]
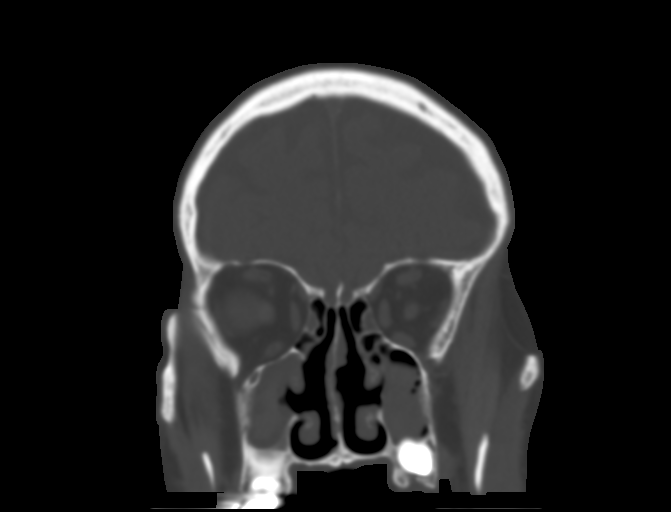

[Series 9: c_spine 2.0 st · axial · 0.40mm/px · z∈[-244,-136]mm · 4 of 91 slices shown, 5 images]
[im 19/91  soft-tissue]
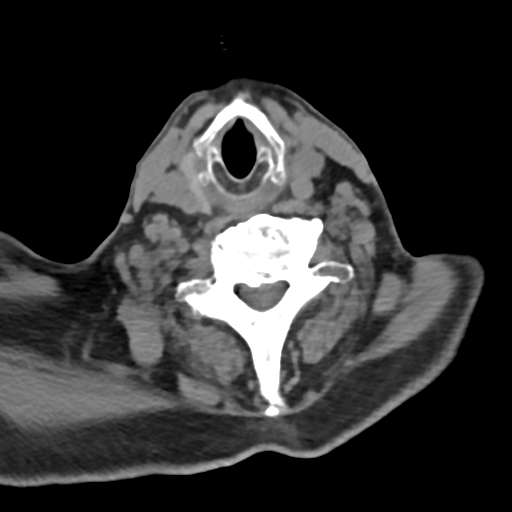
[im 19/91  bone]
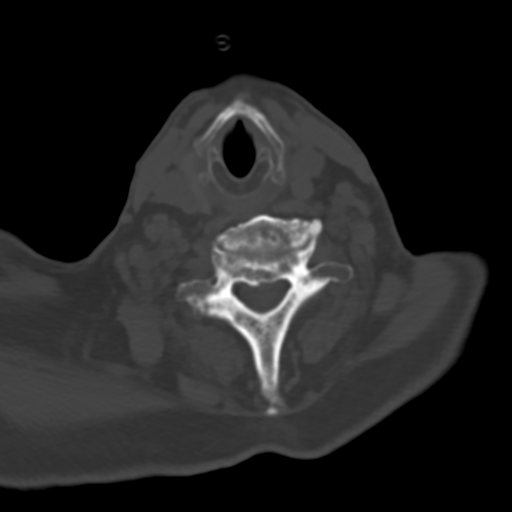
[im 37/91  bone]
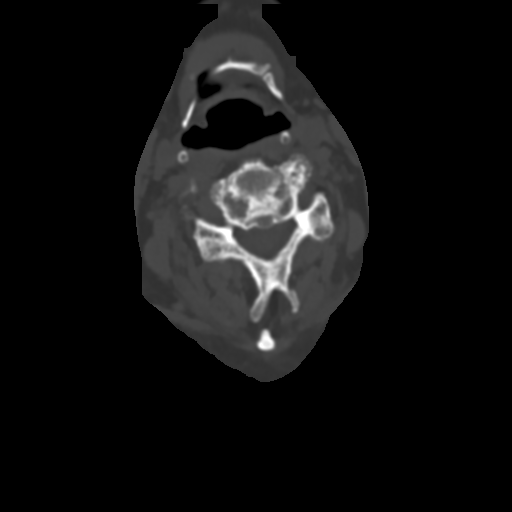
[im 55/91  bone]
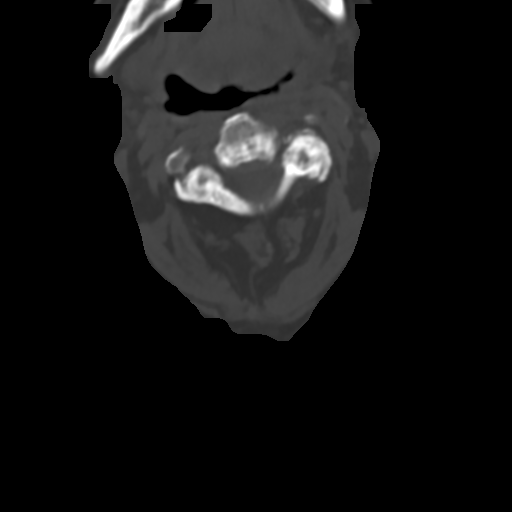
[im 73/91  bone]
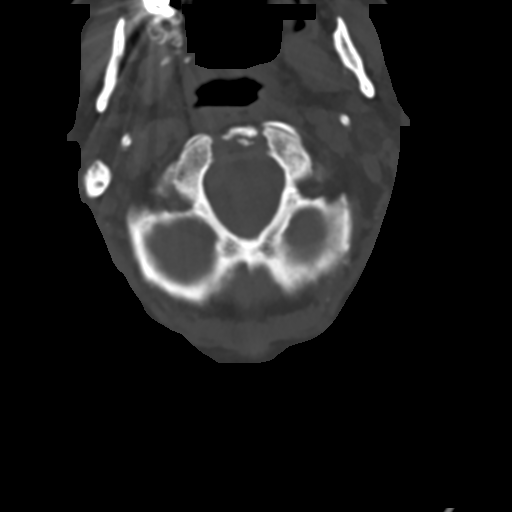

[Series 11: sagittal bone · sagittal · 0.37mm/px · 4 of 61 slices shown]
[im 13/61  bone]
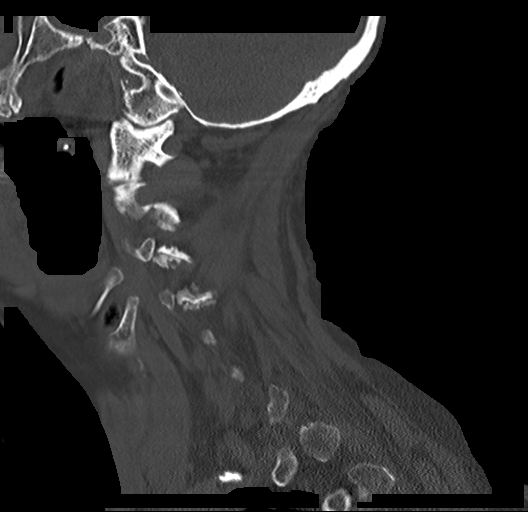
[im 25/61  bone]
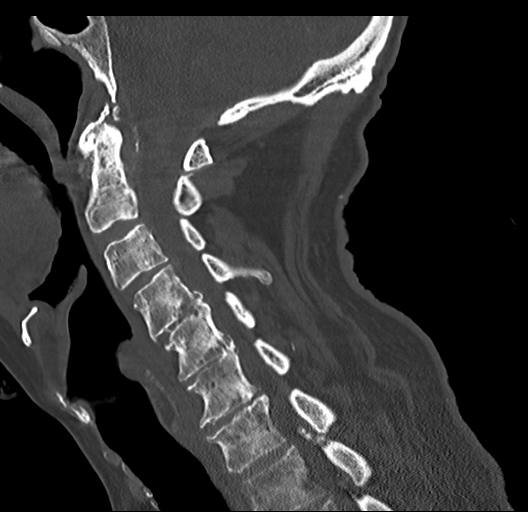
[im 37/61  bone]
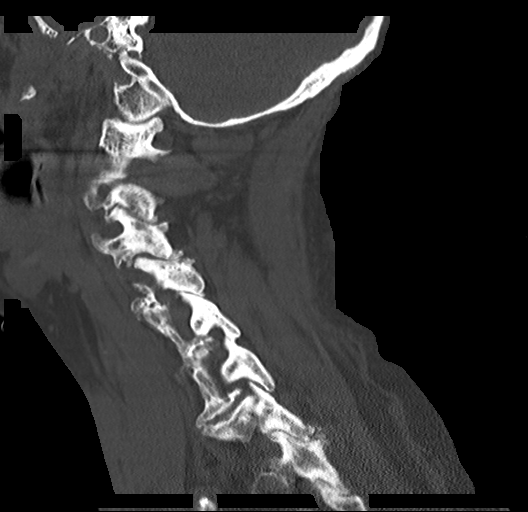
[im 49/61  bone]
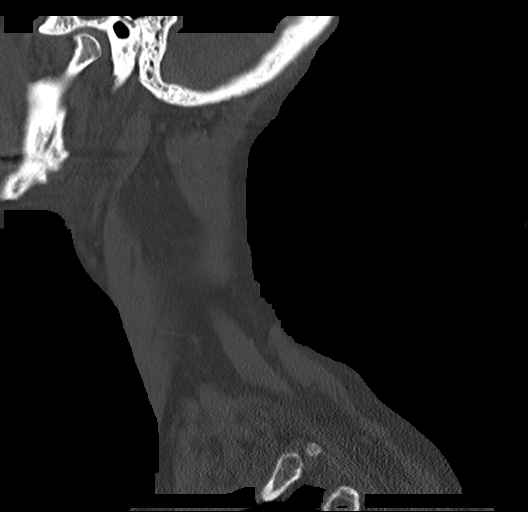

[Series 13: orthogonal axial st · axial · 0.21mm/px · z∈[-268,-174]mm · 4 of 89 slices shown]
[im 18/89  bone]
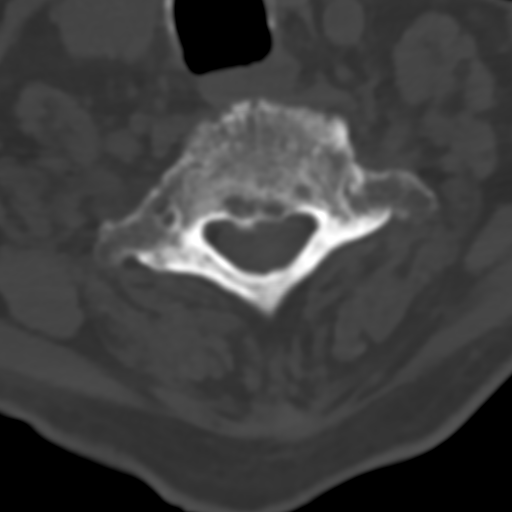
[im 36/89  bone]
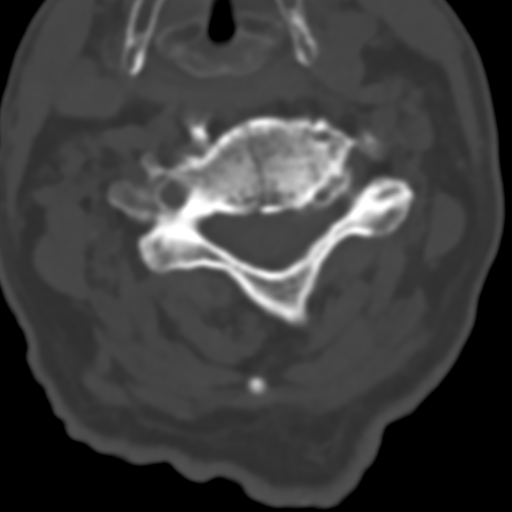
[im 53/89  bone]
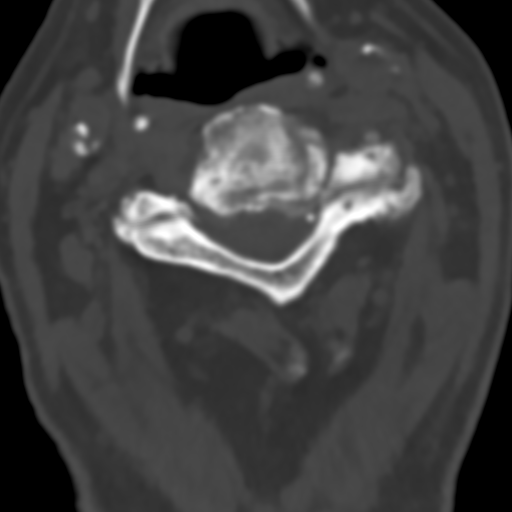
[im 71/89  bone]
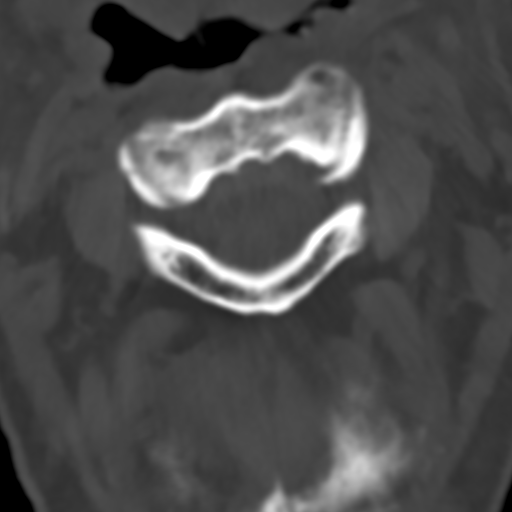

[15 of 33 positions shown; findings below may reference images not displayed]

FINDINGS: CT HEAD FINDINGS

Brain: No evidence of acute infarction, hemorrhage, hydrocephalus,
extra-axial collection or mass lesion/mass effect.

Vascular: Atherosclerotic calcification

Skull: Negative for fracture

Sinuses/Orbits: No evidence of injury.

Bilateral cataract resection.

Chronic sinusitis with extensive mucosal thickening in the frontal,
ethmoid, and right more than left maxillary sinuses. Chronic left
partial mastoid opacification with negative nasopharynx.

CT CERVICAL SPINE FINDINGS

Alignment: No traumatic malalignment

Skull base and vertebrae: Negative for fracture

Soft tissues and spinal canal: No prevertebral fluid or swelling. No
visible canal hematoma.

Disc levels: Extensive disc and facet degeneration with multilevel
foraminal stenosis. Retro dental ligamentous thickening and
calcification without erosion.

Upper chest: Negative
IMPRESSION: No evidence of acute intracranial or cervical spine injury
